# Patient Record
Sex: Male | Born: 1955 | Race: White | Hispanic: No | Marital: Single | Smoking: Former smoker
Health system: Southern US, Community
[De-identification: ages and names within clinical notes are randomized; demographics above are authoritative.]

## PROBLEM LIST (undated history)

## (undated) DIAGNOSIS — N200 Calculus of kidney: Secondary | ICD-10-CM

## (undated) DIAGNOSIS — I10 Essential (primary) hypertension: Secondary | ICD-10-CM

## (undated) DIAGNOSIS — G4730 Sleep apnea, unspecified: Secondary | ICD-10-CM

## (undated) DIAGNOSIS — Z950 Presence of cardiac pacemaker: Secondary | ICD-10-CM

## (undated) DIAGNOSIS — R001 Bradycardia, unspecified: Secondary | ICD-10-CM

## (undated) DIAGNOSIS — K5790 Diverticulosis of intestine, part unspecified, without perforation or abscess without bleeding: Secondary | ICD-10-CM

## (undated) DIAGNOSIS — I480 Paroxysmal atrial fibrillation: Secondary | ICD-10-CM

## (undated) DIAGNOSIS — F329 Major depressive disorder, single episode, unspecified: Secondary | ICD-10-CM

## (undated) DIAGNOSIS — I5022 Chronic systolic (congestive) heart failure: Secondary | ICD-10-CM

## (undated) DIAGNOSIS — J449 Chronic obstructive pulmonary disease, unspecified: Secondary | ICD-10-CM

## (undated) DIAGNOSIS — M1990 Unspecified osteoarthritis, unspecified site: Secondary | ICD-10-CM

## (undated) DIAGNOSIS — L570 Actinic keratosis: Secondary | ICD-10-CM

## (undated) DIAGNOSIS — Z87442 Personal history of urinary calculi: Secondary | ICD-10-CM

## (undated) DIAGNOSIS — F32A Depression, unspecified: Secondary | ICD-10-CM

## (undated) DIAGNOSIS — R7611 Nonspecific reaction to tuberculin skin test without active tuberculosis: Secondary | ICD-10-CM

## (undated) HISTORY — DX: Major depressive disorder, single episode, unspecified: F32.9

## (undated) HISTORY — DX: Paroxysmal atrial fibrillation: I48.0

## (undated) HISTORY — DX: Diverticulosis of intestine, part unspecified, without perforation or abscess without bleeding: K57.90

## (undated) HISTORY — DX: Chronic systolic (congestive) heart failure: I50.22

## (undated) HISTORY — DX: Depression, unspecified: F32.A

## (undated) HISTORY — DX: Calculus of kidney: N20.0

## (undated) HISTORY — DX: Bradycardia, unspecified: R00.1

## (undated) HISTORY — PX: APPENDECTOMY: SHX54

## (undated) HISTORY — PX: INSERT / REPLACE / REMOVE PACEMAKER: SUR710

## (undated) HISTORY — DX: Actinic keratosis: L57.0

## (undated) HISTORY — DX: Nonspecific reaction to tuberculin skin test without active tuberculosis: R76.11

## (undated) HISTORY — PX: CARDIAC CATHETERIZATION: SHX172

---

## 1989-01-25 DIAGNOSIS — R7611 Nonspecific reaction to tuberculin skin test without active tuberculosis: Secondary | ICD-10-CM

## 1989-01-25 HISTORY — DX: Nonspecific reaction to tuberculin skin test without active tuberculosis: R76.11

## 2005-03-14 ENCOUNTER — Inpatient Hospital Stay (HOSPITAL_COMMUNITY): Admission: EM | Admit: 2005-03-14 | Discharge: 2005-03-15 | Payer: Self-pay | Admitting: Emergency Medicine

## 2013-12-06 ENCOUNTER — Emergency Department: Payer: Self-pay | Admitting: Emergency Medicine

## 2014-02-12 ENCOUNTER — Inpatient Hospital Stay: Payer: Self-pay | Admitting: Internal Medicine

## 2014-02-12 LAB — CBC WITH DIFFERENTIAL/PLATELET
BASOS ABS: 0.1 10*3/uL (ref 0.0–0.1)
BASOS PCT: 0.8 %
EOS PCT: 1.2 %
Eosinophil #: 0.2 10*3/uL (ref 0.0–0.7)
HCT: 49.6 % (ref 40.0–52.0)
HGB: 16.7 g/dL (ref 13.0–18.0)
LYMPHS ABS: 2.5 10*3/uL (ref 1.0–3.6)
LYMPHS PCT: 19.9 %
MCH: 32.3 pg (ref 26.0–34.0)
MCHC: 33.7 g/dL (ref 32.0–36.0)
MCV: 96 fL (ref 80–100)
MONOS PCT: 7.7 %
Monocyte #: 1 x10 3/mm (ref 0.2–1.0)
NEUTROS ABS: 9 10*3/uL — AB (ref 1.4–6.5)
Neutrophil %: 70.4 %
Platelet: 250 10*3/uL (ref 150–440)
RBC: 5.18 10*6/uL (ref 4.40–5.90)
RDW: 12.7 % (ref 11.5–14.5)
WBC: 12.8 10*3/uL — AB (ref 3.8–10.6)

## 2014-02-12 LAB — COMPREHENSIVE METABOLIC PANEL
ALK PHOS: 79 U/L
ALT: 21 U/L
AST: 22 U/L (ref 15–37)
Albumin: 3.6 g/dL (ref 3.4–5.0)
Anion Gap: 5 — ABNORMAL LOW (ref 7–16)
BUN: 7 mg/dL (ref 7–18)
Bilirubin,Total: 0.4 mg/dL (ref 0.2–1.0)
CALCIUM: 8.6 mg/dL (ref 8.5–10.1)
CO2: 29 mmol/L (ref 21–32)
CREATININE: 0.74 mg/dL (ref 0.60–1.30)
Chloride: 105 mmol/L (ref 98–107)
Glucose: 100 mg/dL — ABNORMAL HIGH (ref 65–99)
Osmolality: 276 (ref 275–301)
Potassium: 3.6 mmol/L (ref 3.5–5.1)
Sodium: 139 mmol/L (ref 136–145)
Total Protein: 7.3 g/dL (ref 6.4–8.2)

## 2014-02-12 LAB — URINALYSIS, COMPLETE
BACTERIA: NONE SEEN
BILIRUBIN, UR: NEGATIVE
GLUCOSE, UR: NEGATIVE mg/dL (ref 0–75)
KETONE: NEGATIVE
LEUKOCYTE ESTERASE: NEGATIVE
Nitrite: NEGATIVE
PH: 7 (ref 4.5–8.0)
Protein: NEGATIVE
SPECIFIC GRAVITY: 1.004 (ref 1.003–1.030)
Squamous Epithelial: 1

## 2014-02-12 LAB — TROPONIN I: Troponin-I: <0.02 ng/mL

## 2014-02-12 LAB — LIPASE, BLOOD: Lipase: 104 U/L (ref 73–393)

## 2014-02-13 LAB — CBC WITH DIFFERENTIAL/PLATELET
Basophil #: 0.1 10*3/uL (ref 0.0–0.1)
Basophil %: 0.9 %
EOS PCT: 1.7 %
Eosinophil #: 0.2 10*3/uL (ref 0.0–0.7)
HCT: 43.4 % (ref 40.0–52.0)
HGB: 14.8 g/dL (ref 13.0–18.0)
LYMPHS ABS: 2.6 10*3/uL (ref 1.0–3.6)
Lymphocyte %: 26.3 %
MCH: 32.7 pg (ref 26.0–34.0)
MCHC: 34.1 g/dL (ref 32.0–36.0)
MCV: 96 fL (ref 80–100)
MONOS PCT: 7.9 %
Monocyte #: 0.8 x10 3/mm (ref 0.2–1.0)
NEUTROS ABS: 6.3 10*3/uL (ref 1.4–6.5)
Neutrophil %: 63.2 %
Platelet: 188 10*3/uL (ref 150–440)
RBC: 4.52 10*6/uL (ref 4.40–5.90)
RDW: 12.3 % (ref 11.5–14.5)
WBC: 10 10*3/uL (ref 3.8–10.6)

## 2014-02-13 LAB — COMPREHENSIVE METABOLIC PANEL
ALBUMIN: 3 g/dL — AB (ref 3.4–5.0)
ALK PHOS: 68 U/L
ANION GAP: 5 — AB (ref 7–16)
AST: 18 U/L (ref 15–37)
BUN: 9 mg/dL (ref 7–18)
Bilirubin,Total: 0.5 mg/dL (ref 0.2–1.0)
CALCIUM: 8.2 mg/dL — AB (ref 8.5–10.1)
CHLORIDE: 109 mmol/L — AB (ref 98–107)
CREATININE: 0.75 mg/dL (ref 0.60–1.30)
Co2: 27 mmol/L (ref 21–32)
EGFR (African American): >60
Glucose: 80 mg/dL (ref 65–99)
OSMOLALITY: 279 (ref 275–301)
Potassium: 3.4 mmol/L — ABNORMAL LOW (ref 3.5–5.1)
SGPT (ALT): 18 U/L
Sodium: 141 mmol/L (ref 136–145)
Total Protein: 6.1 g/dL — ABNORMAL LOW (ref 6.4–8.2)

## 2014-04-09 ENCOUNTER — Ambulatory Visit (INDEPENDENT_AMBULATORY_CARE_PROVIDER_SITE_OTHER): Payer: Self-pay | Admitting: Psychology

## 2014-04-09 DIAGNOSIS — F332 Major depressive disorder, recurrent severe without psychotic features: Secondary | ICD-10-CM

## 2014-05-07 ENCOUNTER — Ambulatory Visit (INDEPENDENT_AMBULATORY_CARE_PROVIDER_SITE_OTHER): Payer: Self-pay | Admitting: Psychology

## 2014-05-07 DIAGNOSIS — F1020 Alcohol dependence, uncomplicated: Secondary | ICD-10-CM

## 2014-05-07 DIAGNOSIS — F332 Major depressive disorder, recurrent severe without psychotic features: Secondary | ICD-10-CM

## 2014-05-15 ENCOUNTER — Ambulatory Visit (INDEPENDENT_AMBULATORY_CARE_PROVIDER_SITE_OTHER): Payer: Self-pay | Admitting: Psychology

## 2014-05-15 DIAGNOSIS — F1020 Alcohol dependence, uncomplicated: Secondary | ICD-10-CM

## 2014-05-15 DIAGNOSIS — F332 Major depressive disorder, recurrent severe without psychotic features: Secondary | ICD-10-CM

## 2014-05-20 LAB — SURGICAL PATHOLOGY

## 2014-05-22 ENCOUNTER — Ambulatory Visit: Payer: 59 | Admitting: Psychology

## 2014-05-26 NOTE — Consult Note (Signed)
Chart reviewed and case discussed with PA student.  Probable duodenal ulcer, concern for micro perf, no free air on CT.  Dr. Candace Cruise to do today.  Electronic Signatures: Manya Silvas (MD)  (Signed on 20-Jan-16 10:41)  Authored  Last Updated: 20-Jan-16 10:41 by Manya Silvas (MD)

## 2014-05-26 NOTE — Discharge Summary (Signed)
PATIENT NAME:  Brandon, Lopez MR#:  [id number] DATE OF BIRTH:  [birth year]  DATE OF ADMISSION:  02/12/2014 DATE OF DISCHARGE:  02/14/2014  ADMITTING DIAGNOSIS:  Abdominal pain.   DISCHARGE DIAGNOSES:  1.  Duodenitis with likely duodenal ulceration.  2.  Obstructive sleep apnea on CPAP.  3.  Hypertension.  4.  Sick sinus syndrome status post permanent pacemaker placement.  5.  Chronic obstructive pulmonary disease.  6.  Coronary artery disease   DISCHARGE MEDICATIONS:  1.  Pantoprazole 40 mg twice a day for eight weeks or until instructed to stop it by gastroenterology.  2.  Lisinopril 40 mg 1 tablet daily.  3.  Hydrochlorothiazide 25 mg 1 tablet daily.  4.  Aspirin 81 mg 1 tablet daily.  5.  Celexa 40 mg 1 tablet daily.  6.  Symbicort 160 mcg to 4.5 mcg/inhalations 2 puffs inhaled twice a day.   CONSULTATIONS:  Verdie Shire, gastroenterology.   PROCEDURES:  1.  Upper GI endoscopy January 20, by Dr. Candace Cruise shows normal stomach.  Localized severely erythematous mucous found in the first part of the duodenum. Biopsies were taken. This area is also very edematous. Normal esophagus.  2.  CT scan abdomen and pelvis showing abnormal segmental thickening of duodenal wall with significant stranding surrounding duodenal fat in the mid duodenum. Findings highly suspicious for segmental enteritis ulcer disease or possible neoplastic process. Calcified gallstones noted. No obstruction. No pericecal inflammation. Distal colonic diverticula noted. No diverticulitis.   HISTORY OF PRESENT ILLNESS: This 59 year old Caucasian gentleman with history of COPD, obstructive sleep apnea on BiPAP, coronary artery disease, presents with abdominal pain of five days' duration. Pain is 6-9 out of 10 in intensity. He has associated nausea, vomiting. No melena, hematochezia, or change in bowel habits.   HOSPITAL COURSE BY PROBLEM:   1.  Duodenitis with probable duodenal ulcer: CT abdomen showed focal inflammation of the  duodenum. The gastroenterology, Dr. Candace Cruise, was consulted. Upper gastrointestinal endoscopy was performed on January 20, showing significant inflammation and edema of the duodenum. Dr. Candace Cruise felt that this was likely due to a healed duodenal ulcer. Biopsies were taken for pathology and also for helicobacter pylori testing. The patient did well after the procedure. He tolerated a clear liquid and was advanced to an easy to digest diet. At the time of discharge, he is not having any pain and is tolerating an easy to digest diet. He will be discharged on b.i.d. PPI.  He will follow up with gastroenterology in two weeks.  2.  Hypertension: The patient's blood pressures were fairly well controlled in hospital on his home regimen.  3.  Coronary artery disease: The patient did not have any chest pain throughout the hospitalization. He will remain on his home regimen of aspirin, antihypertensives.  4.  Chronic obstructive pulmonary disease: No signs of COPD exacerbation during hospitalization, respiratory status remained stable; he did not need supplemental oxygenation, and remains on home inhalers on discharge.  5.  Obstructive sleep apnea: The patient had a CPAP machine during his hospitalization.   DISCHARGE PHYSICAL EXAMINATION:  VITAL SIGNS: Temperature 97.8, pulse 65, respirations 18, blood pressure 168/94, oxygenation 99% on room air.  GENERAL: No acute distress.  CARDIOVASCULAR: Regular rate and rhythm. No murmurs, rubs, or gallops.  RESPIRATORY: Lungs clear to auscultation bilaterally with good air movement.  ABDOMEN: Soft, nontender, nondistended, bowel sounds are normal.  PSYCHIATRIC: The patient is alert and oriented x4 with good insight into his clinical condition.   LABORATORY DATA:  Sodium 141, potassium 3.4 (this was corrected with 40 mEq of oral potassium), chloride 109, bicarbonate 27, BUN 9, creatinine 0.75, glucose 80, lipase 104, total protein 6.1, albumin 3.0, bilirubin 0.5, alkaline phosphatase  68, AST 18, ALT 18. Troponin is less than 0.02. White blood cells 10, hemoglobin 14.8, platelets at 188, and MCV is 96.  Urinalysis showing no signs of infection. Pathology from EGD shows no helicobacter pylori, chronic duodenitis.   CONDITION ON DISCHARGE: Stable.   DISPOSITION: The patient is discharged home with no home health needs.   DISCHARGE INSTRUCTIONS:   DIET: Heart healthy diet:  The patient has been advised to choose softer easier to digest foods and avoid sharp crunchy foods for at least  two weeks after discharge.   ACTIVITY: No restrictions.   FOLLOWUP:  The patient will follow up with Dr. Candace Cruise within the next two weeks.   TIME SPENT ON DISCHARGE: 40 minutes.     ____________________________ Earleen Newport. Volanda Napoleon, MD cpw:at D: 02/14/2014 17:46:44 ET T: 02/14/2014 18:16:53 ET JOB#: [id number]  cc: Barnetta Chapel P. Volanda Napoleon, MD, <Dictator> Aldean Jewett MD ELECTRONICALLY SIGNED 02/26/2014 9:46

## 2014-05-26 NOTE — H&P (Signed)
PATIENT NAME:  Brandon Lopez, Brandon Lopez MR#:  [id number] DATE OF BIRTH:  1955/10/27  DATE OF ADMISSION:  02/12/2014  REFERRING PHYSICIAN: Debbrah Alar, MD  PRIMARY CARE PHYSICIAN: York Ram, MD  CHIEF COMPLAINT: Abdominal pain.   HISTORY OF PRESENT ILLNESS: A 59 year old Caucasian gentleman with history of COPD, non-oxygen dependent as well as obstructive sleep apnea on BiPAP at nighttime presenting with abdominal pain. Describes a 5-day duration of abdominal pain, right upper quadrant epigastric in location, described only as "pain," intensity 6-9  out of 10, waxing and waning, no worsening or relieving factors. Has associated nausea, vomiting with nonbloody, nonbilious emesis. No change in bowel habits. No melena or hematochezia. Evaluation in the Emergency Department including CT scan revealed duodenitis with potential concern for a small contained perforation. Case was discussed with gastroenterology as well as surgery by the ER staff.   REVIEW OF SYSTEMS:  CONSTITUTIONAL: Denies fever, chills, fatigue, weakness.  EYES: Denies blurred vision, double vision, or eye pain.  EAR, NOSE, THROAT: Denies tinnitus, ear pain, hearing loss. RESPIRATORY: Denies cough or shortness of breath.  CARDIOVASCULAR: Denies chest pain, palpitations, edema. GASTROINTESTINAL: Positive for nausea, vomiting, abdominal pain, as described above. Denies any diarrhea.  GENITOURINARY: Denies dysuria or hematuria.  ENDOCRINE: Denies nocturia or thyroid problems.  HEMATOLOGIC AND LYMPHATIC: Denies easy bruising or bleeding. SKIN: Denies rash or lesions. MUSCULOSKELETAL: Denies pain in neck, back, shoulder, knees, hips, or arthritic symptoms.  NEUROLOGIC: Denies paralysis, paresthesias.  PSYCHIATRIC: Denies anxiety or depressive symptoms. Otherwise, a full review of systems performed by me is negative.  PAST MEDICAL HISTORY: Includes coronary artery disease without angina; COPD, non oxygen dependent; sinus syndrome,  status post permanent pacemaker insertion as well as appendectomy, obstructive sleep apnea requiring BiPAP therapy at bedtime.   SOCIAL HISTORY: Positive for tobacco use. Positive for 2-3 beers daily; however, stopped drinking alcohol at onset of symptoms greater than 5 days ago.   FAMILY HISTORY: Denies any known cardiovascular or pulmonary disorders.   ALLERGIES: SIMVASTATIN, TRAMADOL.   HOME MEDICATIONS: Include aspirin 81 mg p.o. daily, lisinopril 40 mg p.o. daily, Celexa 40 mg p.o. daily, Symbicort 160/4.5 mg inhalation 2 puffs b.i.d., hydrochlorothiazide 25 mg p.o. daily.   PHYSICAL EXAMINATION:  VITAL SIGNS: Temperature 98, heart rate 65, respirations 18, blood pressure 150/99, saturating 95% on room air. Weight 52.5 kg, BMI 29.4.  GENERAL: Well-nourished, well-developed Caucasian gentleman currently in no acute distress.  HEAD: Normocephalic, atraumatic.  EYES: Pupils equal, round, reactive to light. Extraocular muscles intact. No scleral icterus.  MOUTH: Moist mucous membranes. Dentition intact. No abscess noted. EAR, NOSE, THROAT: Clear without exudates. No external lesions.   NECK: Supple. No thyromegaly. No nodules. No JVD.  PULMONARY: Clear to auscultation bilaterally without wheezes, rales, rhonchi. No use of accessory muscles. Good respiratory effort.  CHEST: Nontender to palpation.  CARDIOVASCULAR: S1, S2, regular rate and rhythm. No murmurs, rubs, or gallops. No edema. Pedal pulses 2+ bilaterally.  GASTROINTESTINAL: Soft. Minimal tenderness in right upper quadrant epigastric region, without guarding, motion tenderness, or rebound. Positive bowel sounds. No hepatosplenomegaly.  MUSCULOSKELETAL: No swelling or edema. Positive for clubbing of the fingers bilaterally. Range of motion full in all extremities.  NEUROLOGIC: Cranial nerves II-XII intact. No gross focal neurologic deficits. Sensation intact. Reflexes intact.  SKIN: No ulcerations, rash, lesions, cyanosis. Skin warm,  dry. Turgor intact.  PSYCHIATRIC: Mood and affect within normal limits. Patient alert, oriented x 3. Insight and judgment intact.    LABORATORY DATA: Sodium 139, potassium 3.6,  chloride 105, bicarbonate 29, BUN 7, creatinine 0.74, glucose 100. LFTs within normal limits. Troponin less than 0.02. WBC 12.8, hemoglobin of 16.7, platelets of 250,000. Urinalysis negative for evidence of infection. Abdominal ultrasound performed, which reveals likely focal fatty sparing of the right lobe of the liver. No focal abnormalities. CT abdomen and pelvis performed which reveals abnormal segmental thickening of duodenal wall with significant stranding in surrounding duodenal fat in the mid duodenum, trace adjacent fluid noted, measuring an area of about 5 cm in length, suspicious for segmental enteritis, ulcer disease, or less likely neoplastic process.   ASSESSMENT AND PLAN: A 59 year old Caucasian chronic obstructive pulmonary disease, non-oxygen dependent; obstructive sleep apnea on BiPAP therapy presenting with abdominal pain.  1.  Duodenitis: Initiate proton pump inhibitor therapy intravenously. We will place the patient n.p.o., intravenous fluid hydration. We will consult gastroenterology for an endoscopy in the morning. The case was discussed with gastroenterology as well as surgery in the Emergency Department. Has received Zosyn for antibiotic coverage. We will continue this. 2.  Obstructive sleep apnea: Provide CPAP therapy at bedtime. Symbicort. 3.  Hypertension: Continue with lisinopril. Hold hydrochlorothiazide.  4.  Venous thromboembolism prophylaxis: Sequential compression devices.   CODE STATUS: Patient is a full code.  TIME SPENT: 45 minutes.   ____________________________ Aaron Mose. Hower, MD dkh:bm D: 02/12/2014 23:32:15 ET T: 02/13/2014 02:35:18 ET JOB#: [id number]  cc: Aaron Mose. Hower, MD, <Dictator> DAVID Woodfin Ganja MD ELECTRONICALLY SIGNED 02/13/2014 3:33

## 2014-05-26 NOTE — Consult Note (Signed)
EGD showed an area in proximal duodenum with significant edema and erythema. No obvious ulcer seen. However, patient likely had a penetrating ulcer at this site. Continue PPI bid. Start clears and advance as tolerated. Stomach bx for H.pylori also taken. If positive, treat as well. Thanks.  Electronic Signatures: Verdie Shire (MD)  (Signed on 20-Jan-16 13:59)  Authored  Last Updated: 20-Jan-16 13:59 by Verdie Shire (MD)

## 2014-05-26 NOTE — Consult Note (Signed)
PATIENT NAME:  Brandon Lopez, Brandon Lopez MR#:  [id number] DATE OF BIRTH:  26-Mar-1955  DATE OF CONSULTATION:  02/13/2014  REFERRING PHYSICIAN:   CONSULTING PHYSICIAN:  Manya Silvas, MD  HISTORY OF PRESENT ILLNESS: The patient is a 59 year old white male presenting with abdominal pain of 5 days' duration, epigastric/right upper quadrant, probably 7 out of 10 scale, associated with nausea and vomiting, with no hematemesis and no melena. He presented to the ER. CAT scan showed abnormality of the duodenum, and I was contacted by the ER for an opinion. I recommended he be admitted to the hospital. I am seeing him in consult today. Dr. Candace Cruise did an upper endoscopy on him at my request, because of my schedule problems.   REVIEW OF SYSTEMS: Positive for nausea, vomiting and abdominal pain.   PAST MEDICAL HISTORY: He has a history of coronary artery disease and COPD  SOCIAL HISTORY: Drinks a few beers a day, sometimes as many as a 6-8, and sometimes as low as 2-3. Also smoking cigarettes; trying to quit and going to e-type cigarettes.   MEDICATIONS: Celexa 40 mg a day, aspirin 81 mg a day, Symbicort inhaler 2 puffs b.i.d., hydrochlorothiazide 25 mg a day, lisinopril 40 mg a day.   PHYSICAL EXAMINATION:  VITAL SIGNS: Blood pressure 150/100, respirations 18, heart rate 64.  GENERAL: White male in no acute distress.  HEENT: Sclerae non icteric. Conjunctivae negative.  CHEST: Clear.  HEART: No murmurs or gallops.  ABDOMEN: Abdomen shows minimal tenderness in the right upper quadrant and epigastric area.  SKIN: Warm and dry.  PSYCHIATRIC: Mood and affect are appropriate.   LABORATORY DATA: BUN 7, creatinine 0.74. Troponin less than 0.02. White count 12.8, hemoglobin 16.7. CT scan of the abdomen shows a segmental thickening of the duodenal wall, enhancement of the mucosa suspicion for segmental ulcer disease, small fluid collection in the duodenal wall and a small amount of air, possibly an ulcer, possible  diverticulum, possible contained perforation.   PLAN: The patient needs an upper endoscopy to confirmed the severity of his problem,  performed by Dr. Candace Cruise today, showing duodenal ulceration.    ____________________________ Manya Silvas, MD rte:MT D: 02/13/2014 17:29:46 ET T: 02/13/2014 17:40:13 ET JOB#: [id number]  cc: Manya Silvas, MD, <Dictator> Manya Silvas MD ELECTRONICALLY SIGNED 03/07/2014 14:37

## 2014-05-27 ENCOUNTER — Encounter
Admission: RE | Disposition: A | Discharge status: Home / Self Care | Payer: Self-pay | Source: Ambulatory Visit | Attending: Gastroenterology

## 2014-05-27 ENCOUNTER — Encounter: Payer: Self-pay | Admitting: *Deleted

## 2014-05-27 ENCOUNTER — Ambulatory Visit: Payer: 59 | Admitting: Anesthesiology

## 2014-05-27 ENCOUNTER — Ambulatory Visit
Admission: RE | Admit: 2014-05-27 | Discharge: 2014-05-27 | Disposition: A | Discharge status: Home / Self Care | Payer: 59 | Source: Ambulatory Visit | Attending: Gastroenterology | Admitting: Gastroenterology

## 2014-05-27 DIAGNOSIS — J449 Chronic obstructive pulmonary disease, unspecified: Secondary | ICD-10-CM | POA: Diagnosis not present

## 2014-05-27 DIAGNOSIS — Z888 Allergy status to other drugs, medicaments and biological substances status: Secondary | ICD-10-CM | POA: Insufficient documentation

## 2014-05-27 DIAGNOSIS — Z1211 Encounter for screening for malignant neoplasm of colon: Secondary | ICD-10-CM | POA: Insufficient documentation

## 2014-05-27 DIAGNOSIS — K5730 Diverticulosis of large intestine without perforation or abscess without bleeding: Secondary | ICD-10-CM | POA: Insufficient documentation

## 2014-05-27 HISTORY — DX: Unspecified osteoarthritis, unspecified site: M19.90

## 2014-05-27 HISTORY — DX: Presence of cardiac pacemaker: Z95.0

## 2014-05-27 HISTORY — DX: Chronic obstructive pulmonary disease, unspecified: J44.9

## 2014-05-27 HISTORY — PX: COLONOSCOPY: SHX5424

## 2014-05-27 HISTORY — DX: Sleep apnea, unspecified: G47.30

## 2014-05-27 HISTORY — DX: Essential (primary) hypertension: I10

## 2014-05-27 SURGERY — COLONOSCOPY
Anesthesia: General

## 2014-05-27 MED ORDER — PROPOFOL 10 MG/ML IV BOLUS
INTRAVENOUS | Status: DC | PRN
  Administered 2014-05-27: 20 mg via INTRAVENOUS
  Administered 2014-05-27: 30 mg via INTRAVENOUS
  Administered 2014-05-27: 20 mg via INTRAVENOUS

## 2014-05-27 MED ORDER — SODIUM CHLORIDE 0.9 % IV SOLN
INTRAVENOUS | Status: DC
  Administered 2014-05-27: 13:00:00 via INTRAVENOUS

## 2014-05-27 MED ORDER — LIDOCAINE HCL (PF) 2 % IJ SOLN
INTRAMUSCULAR | Status: DC | PRN
  Administered 2014-05-27: 50 mg via INTRADERMAL

## 2014-05-27 MED ORDER — PROPOFOL INFUSION 10 MG/ML OPTIME
INTRAVENOUS | Status: DC | PRN
  Administered 2014-05-27: 150 ug/kg/min via INTRAVENOUS

## 2014-05-27 NOTE — Anesthesia Preprocedure Evaluation (Signed)
Anesthesia Evaluation  Patient identified by MRN, date of birth, ID band Patient awake    Reviewed: Allergy & Precautions, H&P , NPO status , Patient's Chart, lab work & pertinent test results  Airway Mallampati: II  TM Distance: >3 FB Neck ROM: full    Dental  (+) Chipped   Pulmonary sleep apnea (BiPap recommended has not gotten it yet) , COPD COPD inhaler, Current Smoker,          Cardiovascular hypertension, Pt. on medications + pacemaker     Neuro/Psych    GI/Hepatic GERD- (No meds right now)  ,  Endo/Other    Renal/GU Renal disease (Stones)     Musculoskeletal   Abdominal   Peds  Hematology   Anesthesia Other Findings   Reproductive/Obstetrics                             Anesthesia Physical Anesthesia Plan  ASA: II  Anesthesia Plan: General   Post-op Pain Management:    Induction:   Airway Management Planned:   Additional Equipment:   Intra-op Plan:   Post-operative Plan:   Informed Consent: I have reviewed the patients History and Physical, chart, labs and discussed the procedure including the risks, benefits and alternatives for the proposed anesthesia with the patient or authorized representative who has indicated his/her understanding and acceptance.     Plan Discussed with: CRNA  Anesthesia Plan Comments:         Anesthesia Quick Evaluation

## 2014-05-27 NOTE — H&P (Signed)
  Here for screening colonoscopy  PMH. COPD, sleep apnea,   Allergies: pravastatin  Meds: see above  PE: NAD, VSS HEENT;wnl CV:RRR Lungs:CTA Abdomen: NABS, soft, nontender Ext:-C/C/E  Imp: Screening colon  Plan: colonoscopy

## 2014-05-27 NOTE — Op Note (Signed)
[facility] Gastroenterology Patient Name: Brandon Lopez Procedure Date: 05/27/2014 1:17 PM MRN: [id number] Account #: [id number] Date of Birth: 06-18-1955 Admit Type: Outpatient Age: 59 Room: [facility] ENDO ROOM 4 Gender: Male Note Status: Finalized Procedure:         Colonoscopy Indications:       Screening for colorectal malignant neoplasm Providers:         Lupita Dawn. Candace Cruise, MD Medicines:         Monitored Anesthesia Care Complications:     No immediate complications. Procedure:         Pre-Anesthesia Assessment:                    - Prior to the procedure, a History and Physical was                     performed, and patient medications, allergies and                     sensitivities were reviewed. The patient's tolerance of                     previous anesthesia was reviewed.                    - The risks and benefits of the procedure and the sedation                     options and risks were discussed with the patient. All                     questions were answered and informed consent was obtained.                    - After reviewing the risks and benefits, the patient was                     deemed in satisfactory condition to undergo the procedure.                    After obtaining informed consent, the colonoscope was                     passed under direct vision. Throughout the procedure, the                     patient's blood pressure, pulse, and oxygen saturations                     were monitored continuously. The Olympus CF-H180AL                     colonoscope ( S#: [id number] ) was introduced through the                     anus and advanced to the the cecum, identified by                     appendiceal orifice and ileocecal valve. The colonoscopy                     was performed without difficulty. The patient tolerated                     the procedure well. The quality of the  bowel preparation                     was fair. Findings:  Multiple small and large-mouthed diverticula were found in the sigmoid       colon.      The exam was otherwise without abnormality. Impression:        - Diverticulosis in the sigmoid colon.                    - The examination was otherwise normal.                    - No specimens collected. Recommendation:    - Discharge patient to home.                    - High fiber diet.                    - Repeat colonoscopy in 10 years for surveillance.                    - The findings and recommendations were discussed with the                     patient. Procedure Code(s): --- Professional ---                    2202133157, Colonoscopy, flexible; diagnostic, including                     collection of specimen(s) by brushing or washing, when                     performed (separate procedure) Diagnosis Code(s): --- Professional ---                    Z12.11, Encounter for screening for malignant neoplasm of                     colon                    K57.30, Diverticulosis of large intestine without                     perforation or abscess without bleeding CPT copyright 2014 [employer]. All rights reserved. The codes documented in this report are preliminary and upon coder review may  be revised to meet current compliance requirements. Hulen Luster, MD 05/27/2014 1:31:39 PM This report has been signed electronically. Number of Addenda: 0 Note Initiated On: 05/27/2014 1:17 PM Scope Withdrawal Time: 0 hours 5 minutes 54 seconds  Total Procedure Duration: 0 hours 9 minutes 29 seconds       [facility]

## 2014-05-27 NOTE — Anesthesia Postprocedure Evaluation (Signed)
  Anesthesia Post-op Note  Patient: Brandon Lopez  Procedure(s) Performed: Procedure(s): COLONOSCOPY (N/A)  Anesthesia type:General  Patient location: PACU  Post pain: Pain level controlled  Post assessment: Post-op Vital signs reviewed, Patient's Cardiovascular Status Stable, Respiratory Function Stable, Patent Airway and No signs of Nausea or vomiting  Post vital signs: Reviewed and stable  Last Vitals:  Filed Vitals:   05/27/14 1346  BP: 138/97  Pulse: 76  Temp:   Resp: 15    Level of consciousness: awake, alert  and patient cooperative  Complications: No apparent anesthesia complications

## 2014-05-27 NOTE — Transfer of Care (Signed)
Immediate Anesthesia Transfer of Care Note  Patient: Brandon Lopez  Procedure(s) Performed: Procedure(s): COLONOSCOPY (N/A)  Patient Location: PACU  Anesthesia Type:MAC  Level of Consciousness: awake  Airway & Oxygen Therapy: Patient Spontanous Breathing  Post-op Assessment: Report given to RN  Post vital signs: stable  Last Vitals:  Filed Vitals:   05/27/14 1336  BP: 121/89  Pulse: 71  Temp: 36.3 C  Resp: 19    Complications: No apparent anesthesia complications

## 2014-05-29 ENCOUNTER — Ambulatory Visit: Payer: 59 | Admitting: Psychology

## 2014-05-30 ENCOUNTER — Encounter: Payer: Self-pay | Admitting: Gastroenterology

## 2014-06-03 ENCOUNTER — Ambulatory Visit (INDEPENDENT_AMBULATORY_CARE_PROVIDER_SITE_OTHER): Payer: 59 | Admitting: Primary Care

## 2014-06-03 ENCOUNTER — Encounter: Payer: Self-pay | Admitting: Primary Care

## 2014-06-03 VITALS — BP 116/70 | HR 85 | Temp 97.7°F | Ht 66.0 in | Wt 181.8 lb

## 2014-06-03 DIAGNOSIS — G4733 Obstructive sleep apnea (adult) (pediatric): Secondary | ICD-10-CM | POA: Diagnosis not present

## 2014-06-03 DIAGNOSIS — F1010 Alcohol abuse, uncomplicated: Secondary | ICD-10-CM | POA: Diagnosis not present

## 2014-06-03 DIAGNOSIS — F17200 Nicotine dependence, unspecified, uncomplicated: Secondary | ICD-10-CM | POA: Insufficient documentation

## 2014-06-03 DIAGNOSIS — I10 Essential (primary) hypertension: Secondary | ICD-10-CM

## 2014-06-03 DIAGNOSIS — F32A Depression, unspecified: Secondary | ICD-10-CM

## 2014-06-03 DIAGNOSIS — F329 Major depressive disorder, single episode, unspecified: Secondary | ICD-10-CM

## 2014-06-03 DIAGNOSIS — Z720 Tobacco use: Secondary | ICD-10-CM | POA: Insufficient documentation

## 2014-06-03 DIAGNOSIS — Z950 Presence of cardiac pacemaker: Secondary | ICD-10-CM | POA: Diagnosis not present

## 2014-06-03 DIAGNOSIS — F419 Anxiety disorder, unspecified: Secondary | ICD-10-CM

## 2014-06-03 NOTE — Assessment & Plan Note (Signed)
Stable on Lisinopril 40 mg and HCTZ 25 mg. Will obtain BMP next visit.

## 2014-06-03 NOTE — Progress Notes (Signed)
Subjective:    Patient ID: Brandon Lopez, male    DOB: Feb 18, 1955, 59 y.o.   MRN: [id number]  HPI  Brandon Lopez is a 59 year old male who presents today to establish care and discuss the problems mentioned below. Will obtain old records.  1) Depression: Diagnosed with depression for 10+ years. He's currently seeing Pervis Hocking for therapy at [facility]. He's currently managed on citalopram 40 mg tablets for the past 5 years. He feels okay, but doesn't feel that he's completely managed with this medication. Denies SI/HI. He's been on Wellbutrin and Effexor independently in the past without success.  2) Tobacco Abuse: Current smoker for the past 50 years. Smoked 3 packs per day at most, currently smokes 1-2 packs per week. He's thought about quitting, but would like to take care of becoming established first.  3) Heart Disease: He has a pacemaker that was implanted in 2013 at [facility] due to symptomatic bradycardia. He's not had follow up for 1.5 years and would like a referral somewhere closer to home for evaluation and maintenance. He received a letter in the mail from [place] which reported his pacemaker was funcitoning well. Denies chest pain, weakness, dizziness. Diet consists of lean meats, vegetables, some fruit, nuts. Drinks coffee, water, 8-12 cans of 12 ounce beer daily.    4) OSA: Multiple sleep studies completed within 3 years ago at [facility], and was found to have sleep apnea. They recommended Bipap equipment rather than CPAP, but he never got equipment. He needs to have his sleep study records sent to [place] for proper equipment. He snores, wakes during the night, and experiences daytime tiredness.   5) Hypertension: Diagnosed 15 years ago. He is currently managed on Lisinopril 40 mg and HCTZ 25. Denies headaches, blurred vision.  6) Alcohol Abuse: Drinks 8-12 cans of 12 ounce beers daily. He has looked into detox treatment but cannot afford the cost. He's unsure of his  current liver function. Denies abdominal pain.  Review of Systems  Constitutional: Negative for unexpected weight change.       Daytime tiredness  HENT: Positive for postnasal drip.   Respiratory: Negative for shortness of breath.   Cardiovascular: Negative for chest pain.  Gastrointestinal: Negative for abdominal pain, diarrhea and constipation.  Genitourinary: Negative for dysuria, frequency and difficulty urinating.  Musculoskeletal:       Chronic bursitis to bilateral shoulders  Skin: Negative for rash.  Neurological: Negative for headaches.  Hematological: Negative for adenopathy.  Psychiatric/Behavioral:       See HPI       Past Medical History  Diagnosis Date  . Hypertension   . Presence of permanent cardiac pacemaker     Biotronik; Dayna Barker DR-T [id number]; placed 08/2012  . Sleep apnea   . COPD (chronic obstructive pulmonary disease)   . Arthritis   . Depression   . Kidney stones   . 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 Diverticulosis     Found during colonoscopy    History   Social History  . Marital Status: Single    Spouse Name: N/A  . Number of Children: N/A  . Years of Education: N/A   Occupational History  . Not on file.   Social History Main Topics  . Smoking status: Light Tobacco Smoker -- 0.25 packs/day for 40 years    Types: Cigarettes  . Smokeless tobacco: Not on file  . Alcohol Use: 42.0 oz/week    70 Cans of beer per week  .  Drug Use: No  . Sexual Activity: Not on file   Other Topics Concern  . Not on file   Social History Narrative    Past Surgical History  Procedure Laterality Date  . Appendectomy  age 45  . Insert / replace / remove pacemaker      3 years ago  . Cardiac catheterization      7 years ago  . Colonoscopy N/A 05/27/2014    Procedure: COLONOSCOPY;  Surgeon: Hulen Luster, MD;  Location: [facility] ENDOSCOPY;  Service: Gastroenterology;  Laterality: N/A;    Family History  Problem Relation Age of Onset  . Arthritis Mother   . Heart  disease Father   . Hyperlipidemia Father   . Hypertension Father   . Cancer Paternal Grandfather     Allergies  Allergen Reactions  . Simvastatin Diarrhea    Current Outpatient Prescriptions on File Prior to Visit  Medication Sig Dispense Refill  . albuterol (PROVENTIL HFA;VENTOLIN HFA) 108 (90 BASE) MCG/ACT inhaler Inhale 2 puffs into the lungs every 6 (six) hours as needed.    Marland Kitchen aspirin 81 MG tablet Take 81 mg by mouth daily.    . budesonide-formoterol (SYMBICORT) 80-4.5 MCG/ACT inhaler Inhale 2 puffs into the lungs 2 (two) times daily.    . citalopram (CELEXA) 40 MG tablet Take 40 mg by mouth daily.    . hydrochlorothiazide (HYDRODIURIL) 25 MG tablet Take 25 mg by mouth daily.    Marland Kitchen lisinopril (PRINIVIL,ZESTRIL) 40 MG tablet Take 40 mg by mouth daily.     No current facility-administered medications on file prior to visit.    BP 116/70 mmHg  Pulse 85  Temp(Src) 97.7 F (36.5 C) (Oral)  Ht 5\' 6"  (1.676 m)  Wt 181 lb 12.8 oz (82.464 kg)  BMI 29.36 kg/m2  SpO2 94%    Objective:   Physical Exam  Constitutional: He is oriented to person, place, and time. He appears well-developed.  HENT:  Right Ear: Tympanic membrane and ear canal normal.  Left Ear: Tympanic membrane and ear canal normal.  Nose: Nose normal.  Mouth/Throat: Oropharynx is clear and moist.  Eyes: EOM are normal. Pupils are equal, round, and reactive to light.  Neck: Neck supple.  Cardiovascular: Normal rate and regular rhythm.   Pulmonary/Chest: Effort normal and breath sounds normal.  Abdominal: Soft. Bowel sounds are normal. There is no tenderness.  Lymphadenopathy:    He has no cervical adenopathy.  Neurological: He is alert and oriented to person, place, and time.  Skin: Skin is warm and dry.  Psychiatric: He has a normal mood and affect.          Assessment & Plan:  Spent 5 min discussing the importance tobacco cessation and alcohol cessation today.

## 2014-06-03 NOTE — Assessment & Plan Note (Signed)
Managed on citalopram 40 mg for previous 5 years. He feels somewhat managed, but not entirely symptom free. Currently in therapy. Denies SI/HI. Will re-evaluate during next appointment with PHQ-9 and consider adding additional medication.

## 2014-06-03 NOTE — Patient Instructions (Addendum)
You will be contacted regarding your referral to Cardiology.  Please let us know if you have not heard back within one week.  Please schedule a physical with me in the next 1-2 months. You will also schedule a lab only appointment one week prior. We will discuss your lab results during your physical. It was a pleasure to meet you today! Please don't hesitate to call me with any questions. Welcome to [facility]!

## 2014-06-03 NOTE — Assessment & Plan Note (Signed)
Smoked for past 50 years, has cut back a lot. He is considering quitting, but is not ready today. Education provided on importance of smoking cessation and its effects on his overall health. Will continue to evaluate.

## 2014-06-03 NOTE — Progress Notes (Signed)
Pre visit review using our clinic review tool, if applicable. No additional management support is needed unless otherwise documented below in the visit note. 

## 2014-06-03 NOTE — Assessment & Plan Note (Signed)
Inserted in 2013 due to symptomatic bradycardia. Once followed with [facility], referral made to [facility] for [facility] and follow up. Asymptomatic.

## 2014-06-03 NOTE — Assessment & Plan Note (Signed)
Currently without equipment. Sleep study performed at [place] years ago. Will get old records and fax them to [place] for proper equipment.

## 2014-06-03 NOTE — Assessment & Plan Note (Signed)
Drinks 8-12 cans of 12 ounces of beer daily. He once looked into detox treatment which was too expensive. Will obtain old records, and also LFT's during physical next visit.

## 2014-06-11 ENCOUNTER — Encounter: Payer: Self-pay | Admitting: Internal Medicine

## 2014-06-11 ENCOUNTER — Other Ambulatory Visit: Payer: Self-pay

## 2014-06-11 ENCOUNTER — Telehealth: Payer: Self-pay

## 2014-06-11 ENCOUNTER — Ambulatory Visit (INDEPENDENT_AMBULATORY_CARE_PROVIDER_SITE_OTHER): Payer: 59 | Admitting: Internal Medicine

## 2014-06-11 VITALS — BP 136/87 | HR 66 | Ht 66.0 in | Wt 181.0 lb

## 2014-06-11 DIAGNOSIS — I495 Sick sinus syndrome: Secondary | ICD-10-CM | POA: Diagnosis not present

## 2014-06-11 DIAGNOSIS — R079 Chest pain, unspecified: Secondary | ICD-10-CM

## 2014-06-11 DIAGNOSIS — Z950 Presence of cardiac pacemaker: Secondary | ICD-10-CM | POA: Diagnosis not present

## 2014-06-11 DIAGNOSIS — R55 Syncope and collapse: Secondary | ICD-10-CM

## 2014-06-11 LAB — CUP PACEART INCLINIC DEVICE CHECK
Battery Remaining Percentage: 85 %
Brady Statistic RA Percent Paced: 85 %
Date Time Interrogation Session: 20160517100644
Lead Channel Pacing Threshold Amplitude: 0.7 V
Lead Channel Sensing Intrinsic Amplitude: 3.5 mV
Lead Channel Sensing Intrinsic Amplitude: 8.5 mV
Lead Channel Setting Pacing Amplitude: 2 V
Lead Channel Setting Pacing Pulse Width: 0.4 ms
MDC IDC MSMT LEADCHNL RA IMPEDANCE VALUE: 546 Ohm
MDC IDC MSMT LEADCHNL RA PACING THRESHOLD AMPLITUDE: 0.8 V
MDC IDC MSMT LEADCHNL RA PACING THRESHOLD PULSEWIDTH: 0.4 ms
MDC IDC MSMT LEADCHNL RV IMPEDANCE VALUE: 526 Ohm
MDC IDC MSMT LEADCHNL RV PACING THRESHOLD PULSEWIDTH: 0.4 ms
MDC IDC PG SERIAL: 68053398
MDC IDC SET LEADCHNL RA PACING AMPLITUDE: 1.8 V
MDC IDC SET LEADCHNL RV SENSING SENSITIVITY: 2.5 mV
MDC IDC STAT BRADY RV PERCENT PACED: 0 %

## 2014-06-11 MED ORDER — AMLODIPINE BESYLATE 5 MG PO TABS: 5.0000 mg | ORAL_TABLET | Freq: Every day | ORAL | Status: DC

## 2014-06-11 NOTE — Progress Notes (Signed)
ELECTROPHYSIOLOGY CONSULT NOTE  Patient ID: Brandon Lopez, MRN: [id number], DOB/AGE: 06/20/1955 59 y.o. Admit date: (Not on file) Date of Consult: 06/11/2014  Primary Physician: Sheral Flow, NP Primary Cardiologist: new  Previously at [place]: pacemaker   HPI Brandon Lopez is a 59 y.o. male  Referred for pacemaker followup  This was implanted at [facility] 2014 for DOE and Presyncope with documented sinus rates-40s.  Heart rate excursion on GXT>>112 with normal BP response;  exercise tolerance and dizzy spells decreased in frequency \ He has had no further syncope.  He continues to complain of dyspnea on exertion. This occurs at less than a flight of stairs. He also has exertional chest discomfort described as a weight with radiation into his neck. It typically lasts minutes and resolve with rest. He has a separate chest pain syndrome which is nonexertional fleeting and sharp.  Cardiac risk factors include hypertension, cigarettes, family history and dyslipidemia >>lab 7/14 were consistent with metabolic syndrome with an LDL of 145, HDL of 32 with a triglyceride of 274   Prior cardiac evaluation at [facility] included >>Stress echo images were normal;l  Moderate LAE peak heart rate was only 68%.  Outside records reviewed demonstrating As above   Labs are also notable for recurrinh hypokalemia, most recently 3.4 1/16  Past Medical History  Diagnosis Date  . Hypertension   . Presence of permanent cardiac pacemaker     Biotronik; Dayna Barker DR-T [id number]; placed 08/2012  . Sleep apnea   . COPD (chronic obstructive pulmonary disease)   . Arthritis   . Depression   . Kidney stones   . 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 Diverticulosis     Found during colonoscopy  . CHF (congestive heart failure)       Surgical History:  Past Surgical History  Procedure Laterality Date  . Appendectomy  age 71  . Insert / replace / remove pacemaker      3 years ago  .  Colonoscopy N/A 05/27/2014    Procedure: COLONOSCOPY;  Surgeon: Hulen Luster, MD;  Location: [facility] ENDOSCOPY;  Service: Gastroenterology;  Laterality: N/A;  . Cardiac catheterization      7 years ago     Home Meds: Prior to Admission medications   Medication Sig Start Date End Date Taking? Authorizing Provider  albuterol (PROVENTIL HFA;VENTOLIN HFA) 108 (90 BASE) MCG/ACT inhaler Inhale 2 puffs into the lungs every 6 (six) hours as needed.   Yes Historical Provider, MD  aspirin 81 MG tablet Take 81 mg by mouth daily.   Yes Historical Provider, MD  budesonide-formoterol (SYMBICORT) 80-4.5 MCG/ACT inhaler Inhale 2 puffs into the lungs 2 (two) times daily.   Yes Historical Provider, MD  citalopram (CELEXA) 40 MG tablet Take 40 mg by mouth daily.   Yes Historical Provider, MD  hydrochlorothiazide (HYDRODIURIL) 25 MG tablet Take 25 mg by mouth daily.   Yes Historical Provider, MD  lisinopril (PRINIVIL,ZESTRIL) 40 MG tablet Take 40 mg by mouth daily.   Yes Historical Provider, MD  loratadine (CLARITIN) 10 MG tablet Take 20 mg by mouth daily.   Yes Historical Provider, MD       Allergies:  Allergies  Allergen Reactions  . Simvastatin Diarrhea    History   Social History  . Marital Status: Single    Spouse Name: N/A  . Number of Children: N/A  . Years of Education: N/A   Occupational History  . Not on file.   Social History  Main Topics  . Smoking status: Heavy Tobacco Smoker -- 0.25 packs/day for 40 years    Types: Cigarettes  . Smokeless tobacco: Not on file  . Alcohol Use: 42.0 oz/week    70 Cans of beer per week  . Drug Use: Yes    Special: Marijuana  . Sexual Activity: Not on file   Other Topics Concern  . Not on file   Social History Narrative     Family History  Problem Relation Age of Onset  . Arthritis Mother   . Heart disease Father   . Hyperlipidemia Father   . Hypertension Father   . Cancer Paternal Grandfather      ROS:  Please see the history of present  illness.     All other systems reviewed and negative.    Physical Exam:   Blood pressure 136/87, pulse 66, height 5\' 6"  (1.676 m), weight 181 lb (82.101 kg). General: Well developed, well nourished male in no acute distress. Head: Normocephalic, atraumatic, sclera non-icteric, no xanthomas, nares are without discharge. EENT: normal Lymph Nodes:  none Back: without scoliosis/kyphosis , no CVA tendersness Neck: Negative for carotid bruits. JVD not elevated. Lungs: Clear bilaterally to auscultation without wheezes, rales, or rhonchi. Breathing is unlabored. Heart: RRR with S1 S2. No   murmur , rubs, or gallops appreciated. Abdomen: Soft, non-tender, non-distended with normoactive bowel sounds. No hepatomegaly. No rebound/guarding. No obvious abdominal masses. Msk:  Strength and tone appear normal for age. Extremities: No clubbing or cyanosis. N edema.  Distal pedal pulses are 2+ and equal bilaterally. Skin: Warm and Dry Neuro: Alert and oriented X 3. CN III-XII intact Grossly normal sensory and motor function . Psych:  Responds to questions appropriately with a normal affect.      Labs: Cardiac Enzymes No results for input(s): CKTOTAL, CKMB, TROPONINI in the last 72 hours. CBC Lab Results  Component Value Date   WBC 10.0 02/13/2014   HGB 14.8 02/13/2014   HCT 43.4 02/13/2014   MCV 96 02/13/2014   PLT 188 02/13/2014   PROTIME: No results for input(s): LABPROT, INR in the last 72 hours. Chemistry No results for input(s): NA, K, CL, CO2, BUN, CREATININE, CALCIUM, PROT, BILITOT, ALKPHOS, ALT, AST, GLUCOSE in the last 168 hours.  Invalid input(s): LABALBU Lipids No results found for: CHOL, HDL, LDLCALC, TRIG BNP No results found for: PROBNP Thyroid Function Tests: No results for input(s): TSH, T4TOTAL, T3FREE, THYROIDAB in the last 72 hours.  Invalid input(s): FREET3    Miscellaneous No results found for: DDIMER  Radiology/Studies:  No results found.  EKG:  Difficult to  interpret atrial paced rate at 66. Nonspecific ST changes were referred   Assessment and Plan:    Sinus bradycardia  Pacemaker-Biotronik The patient's device was interrogated and the information was fully reviewed.  The device was reprogrammed to increase the upper rate limit so as to achieve a broader heart rate excursion  Atrial fibrillation-paroxysmal  Dyspnea on exertion  Chest pain consistent with angina  Hypertension  Sleep apnea-currently untreated  Cigarette abuse  Hypokalemia  History of statin intolerance  Alcohol abuse  The patients pacemaker is functioning normally;  we have reprogrammed his upper rate to see if we can get a broader heart rate excursion and thus attenuate some of his dyspnea on exertion. I am also concerned however, about the exertional chest discomfort his pretest likelihood of being coronary disease is quite high and we will undertake Lexiscan imaging. His stress echo a couple of  years ago was nondiagnostic given the upper rate of only 60%.  He has a history of hypokalemia. Hence we will discontinue his HCTZ and put him on amlodipine. He is to see his PCP next month we will defer other medication adjustments to her.  I've encouraged him to pursue therapy for his diagnosed sleep apnea; this may well help  r blood pressure as well as issues of atrial fibrillation.  A short burst of atrial fibrillation (2 hours) was identified. It is not clear how long atrial fibrillation should be prior to the initiation of anticoagulant therapy; however, I don't think its shortest 2 hours and I am using threshold of 12-24 hours.  We await his lipid studies so as to determine whether it is appropriate to try to resume statin therapy. This will be further informed by his stress test results. He has not tolerated statins in the past.  I've encouraged him to stop smoking and to decrease his alcohol intake.    Virl Axe

## 2014-06-11 NOTE — Telephone Encounter (Signed)
Left message on machine for patient to contact the office for lexiscan time/date

## 2014-06-11 NOTE — Patient Instructions (Addendum)
Medication Instructions:  Your physician has recommended you make the following change in your medication:  1) STOP taking Hctz 2) START taking Amlodipine 5mg  daily   Labwork: None  Testing/Procedures: Your physician has requested that you have a lexiscan myoview.  [place]  Your caregiver has ordered a Stress Test with nuclear imaging. The purpose of this test is to evaluate the blood supply to your heart muscle. This procedure is referred to as a "Non-Invasive Stress Test." This is because other than having an IV started in your vein, nothing is inserted or "invades" your body. Cardiac stress tests are done to find areas of poor blood flow to the heart by determining the extent of coronary artery disease (CAD). Some patients exercise on a treadmill, which naturally increases the blood flow to your heart, while others who are  unable to walk on a treadmill due to physical limitations have a pharmacologic/chemical stress agent called Lexiscan . This medicine will mimic walking on a treadmill by temporarily increasing your coronary blood flow.   Please note: these test may take anywhere between 2-4 hours to complete  PLEASE REPORT TO [place] AT THE FIRST DESK WILL DIRECT YOU WHERE TO GO  Date of Procedure:_____________________________________  Arrival Time for Procedure:______________________________  PLEASE NOTIFY THE OFFICE AT LEAST 24 HOURS IN ADVANCE IF YOU ARE UNABLE TO KEEP YOUR APPOINTMENT.  [phone] AND  PLEASE NOTIFY NUCLEAR MEDICINE AT [facility] AT LEAST 24 HOURS IN ADVANCE IF YOU ARE UNABLE TO KEEP YOUR APPOINTMENT. [phone]  How to prepare for your Myoview test:  1. Do not eat or drink after midnight 2. No caffeine for 24 hours prior to test 3. No smoking 24 hours prior to test. 4. Your medication may be taken with water.  If your doctor stopped a medication because of this test, do not take that medication. 5. Ladies, please do  not wear dresses.  Skirts or pants are appropriate. Please wear a short sleeve shirt. 6. No perfume, cologne or lotion. 7. Wear comfortable walking shoes. No heels!           Follow-Up: Your physician wants you to follow-up in: 6 MONTHS with Dr. Caryl Comes.  You will receive a reminder letter in the mail two months in advance. If you don't receive a letter, please call our office to schedule the follow-up appointment.   Any Other Special Instructions Will Be Listed Below (If Applicable).

## 2014-06-11 NOTE — Telephone Encounter (Signed)
S/w pt regarding date, time, and location of lexiscan. Pt verbalized understanding and indicated he would be there 5/25 at 0830

## 2014-06-14 ENCOUNTER — Telehealth: Payer: Self-pay | Admitting: *Deleted

## 2014-06-14 NOTE — Telephone Encounter (Signed)
Pt is calling stating that he recently saw dr Caryl Comes in the office and he adjusted his device.  Past couple days "been un real" really tired.  1. Has your device fired? No   2. Is you device beeping? no  3. Are you experiencing draining or swelling at device site? no  4. Are you calling to see if we received your device transmission? no  5. Have you passed out? No, but having dizzy spells.   Please call patient.

## 2014-06-14 NOTE — Telephone Encounter (Signed)
Pt states he has been "majorly fatigued." Pt felt good prior to appt on Tues.   Pt has not changed meds as instructed (stop Hctz & start amlodipine).   Tentative appt set w/ [place] [facility] 06/18/14 until Home Monitoring is transferred from [facility]

## 2014-06-17 ENCOUNTER — Telehealth: Payer: Self-pay | Admitting: Internal Medicine

## 2014-06-17 NOTE — Telephone Encounter (Signed)
FYI patient cancelled appt as he has a stress test scheduled on Wednesday.  Patient does not feel he needs to reschedule at this time.

## 2014-06-18 ENCOUNTER — Telehealth: Payer: Self-pay | Admitting: *Deleted

## 2014-06-18 NOTE — Telephone Encounter (Signed)
Patient called and unable to make nuclear stress test due to family emergency scheduled for tomorrow 06/19/14.

## 2014-06-18 NOTE — Telephone Encounter (Signed)
Notified Scheduling to cancel test 5/24

## 2014-06-19 ENCOUNTER — Ambulatory Visit: Payer: 59

## 2014-06-19 ENCOUNTER — Other Ambulatory Visit: Payer: Self-pay

## 2014-06-26 ENCOUNTER — Telehealth: Payer: Self-pay

## 2014-06-26 NOTE — Telephone Encounter (Signed)
Left message on machine for patient to contact the office to reschedule myoview.

## 2014-06-28 ENCOUNTER — Other Ambulatory Visit: Payer: Self-pay | Admitting: Primary Care

## 2014-06-28 DIAGNOSIS — I10 Essential (primary) hypertension: Secondary | ICD-10-CM

## 2014-06-28 DIAGNOSIS — I519 Heart disease, unspecified: Secondary | ICD-10-CM

## 2014-06-28 DIAGNOSIS — Z0000 Encounter for general adult medical examination without abnormal findings: Secondary | ICD-10-CM

## 2014-07-01 ENCOUNTER — Telehealth: Payer: Self-pay

## 2014-07-01 NOTE — Telephone Encounter (Signed)
Left message on machine for patient to contact the office to reschedule stress test.

## 2014-07-02 ENCOUNTER — Telehealth: Payer: Self-pay | Admitting: Cardiovascular Disease

## 2014-07-02 NOTE — Telephone Encounter (Signed)
Patient wants to reschedule NM stress test

## 2014-07-02 NOTE — Telephone Encounter (Signed)
Patient is ready to reschedule nm stress test

## 2014-07-02 NOTE — Telephone Encounter (Signed)
S/w patient who would like stress test 6/13 at 9:00am Reviewed instructions with patient. Stated he still has instruction sheet from stress test he had to schedule. Scheduled for 6/13 at 9:00am

## 2014-07-05 ENCOUNTER — Other Ambulatory Visit (INDEPENDENT_AMBULATORY_CARE_PROVIDER_SITE_OTHER): Payer: 59

## 2014-07-05 ENCOUNTER — Encounter: Payer: Self-pay | Admitting: Family Medicine

## 2014-07-05 ENCOUNTER — Ambulatory Visit (INDEPENDENT_AMBULATORY_CARE_PROVIDER_SITE_OTHER): Payer: 59 | Admitting: Family Medicine

## 2014-07-05 VITALS — BP 146/88 | HR 73 | Temp 97.9°F | Wt 182.5 lb

## 2014-07-05 DIAGNOSIS — Z0000 Encounter for general adult medical examination without abnormal findings: Secondary | ICD-10-CM | POA: Diagnosis not present

## 2014-07-05 DIAGNOSIS — I10 Essential (primary) hypertension: Secondary | ICD-10-CM | POA: Diagnosis not present

## 2014-07-05 DIAGNOSIS — A938 Other specified arthropod-borne viral fevers: Secondary | ICD-10-CM

## 2014-07-05 DIAGNOSIS — W57XXXA Bitten or stung by nonvenomous insect and other nonvenomous arthropods, initial encounter: Secondary | ICD-10-CM

## 2014-07-05 DIAGNOSIS — I519 Heart disease, unspecified: Secondary | ICD-10-CM | POA: Diagnosis not present

## 2014-07-05 DIAGNOSIS — Z720 Tobacco use: Secondary | ICD-10-CM | POA: Diagnosis not present

## 2014-07-05 DIAGNOSIS — T148 Other injury of unspecified body region: Secondary | ICD-10-CM

## 2014-07-05 DIAGNOSIS — J441 Chronic obstructive pulmonary disease with (acute) exacerbation: Secondary | ICD-10-CM

## 2014-07-05 LAB — COMPREHENSIVE METABOLIC PANEL
ALK PHOS: 75 U/L (ref 39–117)
ALT: 16 U/L (ref 0–53)
AST: 13 U/L (ref 0–37)
Albumin: 4 g/dL (ref 3.5–5.2)
BUN: 12 mg/dL (ref 6–23)
CALCIUM: 9.2 mg/dL (ref 8.4–10.5)
CO2: 29 mEq/L (ref 19–32)
Chloride: 106 mEq/L (ref 96–112)
Creatinine, Ser: 0.71 mg/dL (ref 0.40–1.50)
GFR: 120.77 mL/min (ref >60.00)
Glucose, Bld: 97 mg/dL (ref 70–99)
POTASSIUM: 4.1 meq/L (ref 3.5–5.1)
Sodium: 140 mEq/L (ref 135–145)
TOTAL PROTEIN: 6.5 g/dL (ref 6.0–8.3)
Total Bilirubin: 0.6 mg/dL (ref 0.2–1.2)

## 2014-07-05 LAB — CBC
HEMATOCRIT: 51.3 % (ref 39.0–52.0)
Hemoglobin: 17.2 g/dL — ABNORMAL HIGH (ref 13.0–17.0)
MCHC: 33.5 g/dL (ref 30.0–36.0)
MCV: 96.2 fl (ref 78.0–100.0)
PLATELETS: 219 10*3/uL (ref 150.0–400.0)
RBC: 5.33 Mil/uL (ref 4.22–5.81)
RDW: 13.6 % (ref 11.5–15.5)
WBC: 7.1 10*3/uL (ref 4.0–10.5)

## 2014-07-05 LAB — LIPID PANEL
CHOLESTEROL: 223 mg/dL — AB (ref 0–200)
HDL: 43.4 mg/dL (ref >39.00)
LDL CALC: 163 mg/dL — AB (ref 0–99)
NonHDL: 179.6
Total CHOL/HDL Ratio: 5
Triglycerides: 85 mg/dL (ref 0.0–149.0)
VLDL: 17 mg/dL (ref 0.0–40.0)

## 2014-07-05 LAB — PSA: PSA: 0.52 ng/mL (ref 0.10–4.00)

## 2014-07-05 LAB — TSH: TSH: 1.62 u[IU]/mL (ref 0.35–4.50)

## 2014-07-05 LAB — HEMOGLOBIN A1C: Hgb A1c MFr Bld: 5.5 % (ref 4.6–6.5)

## 2014-07-05 MED ORDER — DOXYCYCLINE HYCLATE 100 MG PO TABS: 100.0000 mg | ORAL_TABLET | Freq: Two times a day (BID) | ORAL | Status: DC

## 2014-07-05 NOTE — Progress Notes (Signed)
Dr. Frederico Hamman T. Yeshua Stryker, MD, [facility] [place], [address] Phone: [phone] Fax: [phone]  07/05/2014  Patient: Brandon Lopez, MRN: [id number], DOB: 01/08/1956, 59 y.o.  Primary Physician:  Sheral Flow, NP  Chief Complaint: Insect Bite and Fatigue  Subjective:   Brandon Lopez is a 59 y.o. very pleasant male patient who presents with the following:  Tick borne illness: the patient is very active and in the outdoors, and over week ago he pulled multiple ticks off of his abdomen.  There is one place now but has a large skin lesion, somewhat like a bull's-eye lesion.  He has been having arthralgias and feeling poorly overall.  He thinks that he has been running a fever some also.  Feel sick like he is kind of got the flu.  He also is having some increased shortness of breath and he has known COPD.  Tick bite and had a bullseye lesion on his R leg.  There in [facility] - picked off 14 ticks off of his belly.   Past Medical History, Surgical History, Social History, Family History, Problem List, Medications, and Allergies have been reviewed and updated if relevant.  Patient Active Problem List   Diagnosis Date Noted  . Pacemaker 06/03/2014  . Depression 06/03/2014  . Alcohol abuse 06/03/2014  . OSA (obstructive sleep apnea) 06/03/2014  . Essential hypertension 06/03/2014  . Tobacco abuse 06/03/2014    Past Medical History  Diagnosis Date  . Hypertension   . Presence of permanent cardiac pacemaker     Biotronik; Dayna Barker DR-T [id number]; placed 08/2012  . Sleep apnea   . COPD (chronic obstructive pulmonary disease)   . Arthritis   . Depression   . Kidney stones   . 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 Diverticulosis     Found during colonoscopy  . CHF (congestive heart failure)     Past Surgical History  Procedure Laterality Date  . Appendectomy  age 14  . Insert / replace / remove  pacemaker      3 years ago  . Colonoscopy N/A 05/27/2014    Procedure: COLONOSCOPY;  Surgeon: Hulen Luster, MD;  Location: [facility] ENDOSCOPY;  Service: Gastroenterology;  Laterality: N/A;  . Cardiac catheterization      7 years ago    History   Social History  . Marital Status: Single    Spouse Name: N/A  . Number of Children: N/A  . Years of Education: N/A   Occupational History  . Not on file.   Social History Main Topics  . Smoking status: Heavy Tobacco Smoker -- 0.25 packs/day for 40 years    Types: Cigarettes  . Smokeless tobacco: Not on file  . Alcohol Use: 42.0 oz/week    70 Cans of beer per week  . Drug Use: Yes    Special: Marijuana  . Sexual Activity: Not on file   Other Topics Concern  . Not on file   Social History Narrative    Family History  Problem Relation Age of Onset  . Arthritis Mother   . Heart disease Father   . Hyperlipidemia Father   . Hypertension Father   . Cancer Paternal Grandfather     Allergies  Allergen Reactions  . Simvastatin Diarrhea    Medication list reviewed and updated in full in [place].   ROS: GEN: Acute illness details above GI: Tolerating PO intake GU: maintaining adequate hydration and  urination Pulm: Increased SOB Interactive and getting along well at home.  Otherwise, ROS is as per the HPI.   Objective:   BP 146/88 mmHg  Pulse 73  Temp(Src) 97.9 F (36.6 C) (Oral)  Wt 182 lb 8 oz (82.781 kg)  SpO2 98%   GEN: A and O x 3. WDWN. NAD.    ENT: Nose clear, ext NML.  No LAD.  No JVD.  TM's clear. Oropharynx clear.  PULM: Normal WOB, no distress. No crackles, wheezes, rhonchi. Decreased air flow CV: RRR, no M/G/R, No rubs, No JVD.   EXT: warm and well-perfused, No c/c/e. PSYCH: Pleasant and conversant.  SKIN: flat rash noted, central clearing - 4 cm across   Laboratory and Imaging Data:  Assessment and Plan:   Tick borne fever  Tick bite  Tobacco abuse  COPD exacerbation  This him that  this is one of the tick borne illnesses and treated as such.  He also seems to be having a COPD flare.  Mild.  He has not been using any of his albuterol, so I recommended that he use this up to every 4 hours.  Follow-up: No Follow-up on file.  New Prescriptions   DOXYCYCLINE (VIBRA-TABS) 100 MG TABLET    Take 1 tablet (100 mg total) by mouth 2 (two) times daily.   No orders of the defined types were placed in this encounter.    Signed,  Maud Deed. Jamesmichael Shadd, MD   Patient's Medications  New Prescriptions   DOXYCYCLINE (VIBRA-TABS) 100 MG TABLET    Take 1 tablet (100 mg total) by mouth 2 (two) times daily.  Previous Medications   ALBUTEROL (PROVENTIL HFA;VENTOLIN HFA) 108 (90 BASE) MCG/ACT INHALER    Inhale 2 puffs into the lungs every 6 (six) hours as needed.   AMLODIPINE (NORVASC) 5 MG TABLET    Take 1 tablet (5 mg total) by mouth daily.   ASPIRIN 81 MG TABLET    Take 81 mg by mouth daily.   BUDESONIDE-FORMOTEROL (SYMBICORT) 80-4.5 MCG/ACT INHALER    Inhale 2 puffs into the lungs 2 (two) times daily.   CITALOPRAM (CELEXA) 40 MG TABLET    Take 40 mg by mouth daily.   LISINOPRIL (PRINIVIL,ZESTRIL) 40 MG TABLET    Take 40 mg by mouth daily.   LORATADINE (CLARITIN) 10 MG TABLET    Take 20 mg by mouth daily.  Modified Medications   No medications on file  Discontinued Medications   No medications on file

## 2014-07-05 NOTE — Progress Notes (Signed)
Pre visit review using our clinic review tool, if applicable. No additional management support is needed unless otherwise documented below in the visit note. 

## 2014-07-08 ENCOUNTER — Encounter
Admission: RE | Admit: 2014-07-08 | Discharge: 2014-07-08 | Disposition: A | Discharge status: Home / Self Care | Payer: 59 | Source: Ambulatory Visit | Attending: Internal Medicine | Admitting: Internal Medicine

## 2014-07-08 DIAGNOSIS — R079 Chest pain, unspecified: Secondary | ICD-10-CM | POA: Diagnosis present

## 2014-07-08 LAB — NM MYOCAR MULTI W/SPECT W/WALL MOTION / EF
CHL CUP RESTING HR STRESS: 71 {beats}/min
LVDIAVOL: 155 mL
LVSYSVOL: 65 mL
NUC STRESS EF: 49 %
NUC STRESS TID: 1.07
Peak HR: 75 {beats}/min
SDS: 0
SRS: 11
SSS: 0

## 2014-07-08 MED ORDER — TECHNETIUM TC 99M SESTAMIBI - CARDIOLITE
13.6290 | Freq: Once | INTRAVENOUS | Status: AC | PRN
  Administered 2014-07-08: 13.62 via INTRAVENOUS

## 2014-07-08 MED ORDER — REGADENOSON 0.4 MG/5ML IV SOLN
0.4000 mg | Freq: Once | INTRAVENOUS | Status: AC
  Administered 2014-07-08: 0.4 mg via INTRAVENOUS

## 2014-07-08 MED ORDER — TECHNETIUM TC 99M SESTAMIBI - CARDIOLITE
31.7300 | Freq: Once | INTRAVENOUS | Status: AC | PRN
  Administered 2014-07-08: 09:00:00 31.73 via INTRAVENOUS

## 2014-07-11 ENCOUNTER — Ambulatory Visit (INDEPENDENT_AMBULATORY_CARE_PROVIDER_SITE_OTHER): Payer: 59 | Admitting: Primary Care

## 2014-07-11 ENCOUNTER — Encounter: Payer: Self-pay | Admitting: Primary Care

## 2014-07-11 VITALS — BP 136/86 | HR 78 | Temp 98.2°F | Ht 66.0 in | Wt 182.0 lb

## 2014-07-11 DIAGNOSIS — G4733 Obstructive sleep apnea (adult) (pediatric): Secondary | ICD-10-CM | POA: Diagnosis not present

## 2014-07-11 DIAGNOSIS — F329 Major depressive disorder, single episode, unspecified: Secondary | ICD-10-CM

## 2014-07-11 DIAGNOSIS — Z0000 Encounter for general adult medical examination without abnormal findings: Secondary | ICD-10-CM | POA: Insufficient documentation

## 2014-07-11 DIAGNOSIS — E785 Hyperlipidemia, unspecified: Secondary | ICD-10-CM | POA: Diagnosis not present

## 2014-07-11 DIAGNOSIS — Z0001 Encounter for general adult medical examination with abnormal findings: Secondary | ICD-10-CM | POA: Insufficient documentation

## 2014-07-11 DIAGNOSIS — F32A Depression, unspecified: Secondary | ICD-10-CM

## 2014-07-11 DIAGNOSIS — F1010 Alcohol abuse, uncomplicated: Secondary | ICD-10-CM

## 2014-07-11 DIAGNOSIS — I10 Essential (primary) hypertension: Secondary | ICD-10-CM | POA: Diagnosis not present

## 2014-07-11 NOTE — Assessment & Plan Note (Signed)
Today he feels well managed on citalopram 40 mg. PHQ-2 score of 0. He is interested in therapy again and would like to meet with Terri. Referral sent. Follow up in 3 months for re-eval.

## 2014-07-11 NOTE — Assessment & Plan Note (Signed)
Never received sleep study records, will request again.

## 2014-07-11 NOTE — Assessment & Plan Note (Signed)
Continues to drink 8-10 12oz cans of beer nightly. He is not interested in quitting despite recommendations from psychologist. LFT's WNL. Liver unremarkable during exam.

## 2014-07-11 NOTE — Patient Instructions (Signed)
You will be contacted regarding your referral to Psychology.  Please let us know if you have not heard back within one week.   It is important that you improve your diet. Please limit carbohydrates in the form of white bread, rice, pasta, cakes, cookies, sugary drinks, etc. Increase your consumption of fresh fruits and vegetables. Be sure to drink plenty of water daily.  Try to get 30-45 minutes of walking at least 5 days weekly.  Follow up in 3 months for evaluation of depression and cholesterol.  It was nice seeing you!  Cardiac Diet This diet can help prevent heart disease and stroke. Many factors influence your heart health, including eating and exercise habits. Coronary risk rises a lot with abnormal blood fat (lipid) levels. Cardiac meal planning includes limiting unhealthy fats, increasing healthy fats, and making other small dietary changes. General guidelines are as follows:  Adjust calorie intake to reach and maintain desirable body weight.  Limit total fat intake to less than 30% of total calories. Saturated fat should be less than 7% of calories.  Saturated fats are found in animal products and in some vegetable products. Saturated vegetable fats are found in coconut oil, cocoa butter, palm oil, and palm kernel oil. Read labels carefully to avoid these products as much as possible. Use butter in moderation. Choose tub margarines and oils that have 2 grams of fat or less. Good cooking oils are canola and olive oils.  Practice low-fat cooking techniques. Do not fry food. Instead, broil, bake, boil, steam, grill, roast on a rack, stir-fry, or microwave it. Other fat reducing suggestions include:  Remove the skin from poultry.  Remove all visible fat from meats.  Skim the fat off stews, soups, and gravies before serving them.  Steam vegetables in water or broth instead of sauting them in fat.  Avoid foods with trans fat (or hydrogenated oils), such as commercially fried foods  and commercially baked goods. Commercial shortening and deep-frying fats will contain trans fat.  Increase intake of fruits, vegetables, whole grains, and legumes to replace foods high in fat.  Increase consumption of nuts, legumes, and seeds to at least 4 servings weekly. One serving of a legume equals  cup, and 1 serving of nuts or seeds equals  cup.  Choose whole grains more often. Have 3 servings per day (a serving is 1 ounce [oz]).  Eat 4 to 5 servings of vegetables per day. A serving of vegetables is 1 cup of raw leafy vegetables;  cup of raw or cooked cut-up vegetables;  cup of vegetable juice.  Eat 4 to 5 servings of fruit per day. A serving of fruit is 1 medium whole fruit;  cup of dried fruit;  cup of fresh, frozen, or canned fruit;  cup of 100% fruit juice.  Increase your intake of dietary fiber to 20 to 30 grams per day. Insoluble fiber may help lower your risk of heart disease and may help curb your appetite.  Soluble fiber binds cholesterol to be removed from the blood. Foods high in soluble fiber are dried beans, citrus fruits, oats, apples, bananas, broccoli, Brussels sprouts, and eggplant.  Try to include foods fortified with plant sterols or stanols, such as yogurt, breads, juices, or margarines. Choose several fortified foods to achieve a daily intake of 2 to 3 grams of plant sterols or stanols.  Foods with omega-3 fats can help reduce your risk of heart disease. Aim to have a 3.5 oz portion of fatty fish twice per  week, such as salmon, mackerel, albacore tuna, sardines, lake trout, or herring. If you wish to take a fish oil supplement, choose one that contains 1 gram of both DHA and EPA.  Limit processed meats to 2 servings (3 oz portion) weekly.  Limit the sodium in your diet to 1500 milligrams (mg) per day. If you have high blood pressure, talk to a registered dietitian about a DASH (Dietary Approaches to Stop Hypertension) eating plan.  Limit sweets and beverages  with added sugar, such as soda, to no more than 5 servings per week. One serving is:   1 tablespoon sugar.  1 tablespoon jelly or jam.   cup sorbet.  1 cup lemonade.   cup regular soda. CHOOSING FOODS Starches  Allowed: Breads: All kinds (wheat, rye, raisin, white, oatmeal, [place], [place], and English muffin bread). Low-fat rolls: English muffins, frankfurter and hamburger buns, bagels, pita bread, tortillas (not fried). Pancakes, waffles, biscuits, and muffins made with recommended oil.  Avoid: Products made with saturated or trans fats, oils, or whole milk products. Butter rolls, cheese breads, croissants. Commercial doughnuts, muffins, sweet rolls, biscuits, waffles, pancakes, store-bought mixes. Crackers  Allowed: Low-fat crackers and snacks: Animal, graham, rye, saltine (with recommended oil, no lard), oyster, and matzo crackers. Bread sticks, melba toast, rusks, flatbread, pretzels, and light popcorn.  Avoid: High-fat crackers: cheese crackers, butter crackers, and those made with coconut, palm oil, or trans fat (hydrogenated oils). Buttered popcorn. Cereals  Allowed: Hot or cold whole-grain cereals.  Avoid: Cereals containing coconut, hydrogenated vegetable fat, or animal fat. Potatoes / Pasta / Rice  Allowed: All kinds of potatoes, rice, and pasta (such as macaroni, spaghetti, and noodles).  Avoid: Pasta or rice prepared with cream sauce or high-fat cheese. Chow mein noodles, [place] fries. Vegetables  Allowed: All vegetables and vegetable juices.  Avoid: Fried vegetables. Vegetables in cream, butter, or high-fat cheese sauces. Limit coconut. Fruit in cream or custard. Protein  Allowed: Limit your intake of meat, seafood, and poultry to no more than 6 oz (cooked weight) per day. All lean, well-trimmed beef, veal, pork, and lamb. All chicken and [place] without skin. All fish and shellfish. Wild game: wild duck, rabbit, pheasant, and venison. Egg whites or  low-cholesterol egg substitutes may be used as desired. Meatless dishes: recipes with dried beans, peas, lentils, and tofu (soybean curd). Seeds and nuts: all seeds and most nuts.  Avoid: Prime grade and other heavily marbled and fatty meats, such as short ribs, spare ribs, rib eye roast or steak, frankfurters, sausage, bacon, and high-fat luncheon meats, mutton. Caviar. Commercially fried fish. Domestic duck, goose, venison sausage. Organ meats: liver, gizzard, heart, chitterlings, brains, kidney, sweetbreads. Dairy  Allowed: Low-fat cheeses: nonfat or low-fat cottage cheese (1% or 2% fat), cheeses made with part skim milk, such as mozzarella, farmers, string, or ricotta. (Cheeses should be labeled no more than 2 to 6 grams fat per oz.). Skim (or 1%) milk: liquid, powdered, or evaporated. Buttermilk made with low-fat milk. Drinks made with skim or low-fat milk or cocoa. Chocolate milk or cocoa made with skim or low-fat (1%) milk. Nonfat or low-fat yogurt.  Avoid: Whole milk cheeses, including colby, cheddar, mu[place]ster, Monterey Jack, [place], [place], [place]a, American, Swiss, and blue. Creamed cottage cheese, cream cheese. Whole milk and whole milk products, including buttermilk or yogurt made from whole milk, drinks made from whole milk. Condensed milk, evaporated whole milk, and 2% milk. Soups and Combination Foods  Allowed: Low-fat low-sodium soups: broth, dehydrated soups, homemade broth, soups  with the fat removed, homemade cream soups made with skim or low-fat milk. Low-fat spaghetti, lasagna, chili, and Spanish rice if low-fat ingredients and low-fat cooking techniques are used.  Avoid: Cream soups made with whole milk, cream, or high-fat cheese. All other soups. Desserts and Sweets  Allowed: Sherbet, fruit ices, gelatins, meringues, and angel food cake. Homemade desserts with recommended fats, oils, and milk products. Jam, jelly, honey, marmalade, sugars, and syrups. Pure sugar candy, such as  gum drops, hard candy, jelly beans, marshmallows, mints, and small amounts of dark chocolate.  Avoid: Commercially prepared cakes, pies, cookies, frosting, pudding, or mixes for these products. Desserts containing whole milk products, chocolate, coconut, lard, palm oil, or palm kernel oil. Ice cream or ice cream drinks. Candy that contains chocolate, coconut, butter, hydrogenated fat, or unknown ingredients. Buttered syrups. Fats and Oils  Allowed: Vegetable oils: safflower, sunflower, corn, soybean, cottonseed, sesame, canola, olive, or peanut. Non-hydrogenated margarines. Salad dressing or mayonnaise: homemade or commercial, made with a recommended oil. Low or nonfat salad dressing or mayonnaise.  Limit added fats and oils to 6 to 8 tsp per day (includes fats used in cooking, baking, salads, and spreads on bread). Remember to count the "hidden fats" in foods.  Avoid: Solid fats and shortenings: butter, lard, salt pork, bacon drippings. Gravy containing meat fat, shortening, or suet. Cocoa butter, coconut. Coconut oil, palm oil, palm kernel oil, or hydrogenated oils: these ingredients are often used in bakery products, nondairy creamers, whipped toppings, candy, and commercially fried foods. Read labels carefully. Salad dressings made of unknown oils, sour cream, or cheese, such as blue cheese and Roquefort. Cream, all kinds: half-and-half, light, heavy, or whipping. Sour cream or cream cheese (even if "light" or low-fat). Nondairy cream substitutes: coffee creamers and sour cream substitutes made with palm, palm kernel, hydrogenated oils, or coconut oil. Beverages  Allowed: Coffee (regular or decaffeinated), tea. Diet carbonated beverages, mineral water. Alcohol: Check with your caregiver. Moderation is recommended.  Avoid: Whole milk, regular sodas, and juice drinks with added sugar. Condiments  Allowed: All seasonings and condiments. Cocoa powder. "Cream" sauces made with recommended  ingredients.  Avoid: Carob powder made with hydrogenated fats. SAMPLE MENU Breakfast   cup orange juice   cup oatmeal  1 slice toast  1 tsp margarine  1 cup skim milk Lunch  [place] sandwich with 2 oz [place], 2 slices bread  Lettuce and tomato slices  Fresh fruit  Carrot sticks  Coffee or tea Snack  Fresh fruit or low-fat crackers Dinner  3 oz lean ground beef  1 baked potato  1 tsp margarine   cup asparagus  Lettuce salad  1 tbs non-creamy dressing   cup peach slices  1 cup skim milk Document Released: 10/21/2007 Document Revised: 07/13/2011 Document Reviewed: 03/13/2013 ExitCare Patient Information 2015 [place], [place]. This information is not intended to replace advice given to you by your health care provider. Make sure you discuss any questions you have with your health care provider.

## 2014-07-11 NOTE — Progress Notes (Signed)
Pre visit review using our clinic review tool, if applicable. No additional management support is needed unless otherwise documented below in the visit note. 

## 2014-07-11 NOTE — Assessment & Plan Note (Signed)
LDL above goal. Discussed importance of healthy diet and regular diet. Handout provided regarding heart healthy diet.

## 2014-07-11 NOTE — Assessment & Plan Note (Addendum)
Vaccines up to date. Colonoscopy in May 2016, unremarkable. Encouraged annual eye and dental exams. Discussed healthy diet and exercise. Discussed importance of tobacco and alcohol cessation. Exam mostly unremarkable.

## 2014-07-11 NOTE — Assessment & Plan Note (Signed)
Potassium WNL. Stable, continue same.

## 2014-07-11 NOTE — Progress Notes (Signed)
Subjective:    Patient ID: Brandon Lopez, male    DOB: 05-24-1955, 59 y.o.   MRN: [id number]  HPI  Brandon Lopez is a 59 year old male who presents today for complete physical.  Immunizations: -Tetanus: Completed 3 years ago. -Influenza: Completed last season. -Pneumonia: Received 4 months ago.  Diet: His diet consists of lean meats, vegetables, candy, ice cream, and 8-12 cans of beer daily. He is not interested in cessation of alcohol consumption. Exercise: He's not currently exercising. Eye exam: Last eye exam in 5+ years, will schedule soon. Dental exam: Last exam 3 years ago. Colonoscopy: Completed May 2016, no findings. Due again in 10 years. Endoscopy January 2016, duodenum with some swelling, otherwise no findings.  1) Depression: Was seeing Pervis Hocking for therapy and was encouraged to go to rehab for alcoholism. He stopped going to therapy because he felt as though he didn't need rehab and was there to discuss other issues. He is managed on Citalopram 40 mg tablets. During our last visit he didn't feel as though he was completely managed, but today he reports to be feeling well managed. PHQ-2 score of 0 today.  2) OSA: Completed sleep study at [facility] 3 years ago and never received equipment. Will obtain sleep study records from [facility] and send them to [place].  3) Chest pain: follows with Dr. Caryl Comes for pacemaker maintence. He had a cardiac stress test this Monday, no results at this tie.  Follow up appointment [place] in August 2016. Denies chest pain today.  Review of Systems  Constitutional: Negative for unexpected weight change.  HENT: Negative for rhinorrhea.   Respiratory: Negative for cough and shortness of breath.   Cardiovascular:       Chest pain with burning intermittently for years. Negative work up.  Gastrointestinal: Negative for diarrhea and constipation.  Genitourinary: Negative for dysuria and frequency.  Musculoskeletal: Negative for  myalgias and arthralgias.  Skin: Negative for rash.  Neurological: Negative for dizziness and headaches.  Psychiatric/Behavioral:       See HPI       Past Medical History  Diagnosis Date  . Hypertension   . Presence of permanent cardiac pacemaker     Biotronik; Dayna Barker DR-T [id number]; placed 08/2012  . Sleep apnea   . COPD (chronic obstructive pulmonary disease)   . Arthritis   . Depression   . Kidney stones   . 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 Diverticulosis     Found during colonoscopy  . CHF (congestive heart failure)     History   Social History  . Marital Status: Single    Spouse Name: N/A  . Number of Children: N/A  . Years of Education: N/A   Occupational History  . Not on file.   Social History Main Topics  . Smoking status: Heavy Tobacco Smoker -- 1.00 packs/day for 40 years    Types: Cigarettes  . Smokeless tobacco: Not on file  . Alcohol Use: 50.4 oz/week    84 Cans of beer per week  . Drug Use: Yes    Special: Marijuana  . Sexual Activity: Not on file   Other Topics Concern  . Not on file   Social History Narrative    Past Surgical History  Procedure Laterality Date  . Appendectomy  age 59  . Insert / replace / remove pacemaker      3 years ago  . Colonoscopy N/A 05/27/2014    Procedure: COLONOSCOPY;  Surgeon: Lupita Dawn  Candace Cruise, MD;  Location: [facility];  Service: Gastroenterology;  Laterality: N/A;  . Cardiac catheterization      7 years ago    Family History  Problem Relation Age of Onset  . Arthritis Mother   . Heart disease Father   . Hyperlipidemia Father   . Hypertension Father   . Cancer Paternal Grandfather     Allergies  Allergen Reactions  . Simvastatin Diarrhea    Current Outpatient Prescriptions on File Prior to Visit  Medication Sig Dispense Refill  . albuterol (PROVENTIL HFA;VENTOLIN HFA) 108 (90 BASE) MCG/ACT inhaler Inhale 2 puffs into the lungs every 6 (six) hours as needed.    Marland Kitchen amLODipine (NORVASC) 5 MG tablet Take 1 tablet  (5 mg total) by mouth daily. 30 tablet 11  . aspirin 81 MG tablet Take 81 mg by mouth daily.    . budesonide-formoterol (SYMBICORT) 80-4.5 MCG/ACT inhaler Inhale 2 puffs into the lungs 2 (two) times daily.    . citalopram (CELEXA) 40 MG tablet Take 40 mg by mouth daily.    Marland Kitchen doxycycline (VIBRA-TABS) 100 MG tablet Take 1 tablet (100 mg total) by mouth 2 (two) times daily. 28 tablet 0  . lisinopril (PRINIVIL,ZESTRIL) 40 MG tablet Take 40 mg by mouth daily.    Marland Kitchen loratadine (CLARITIN) 10 MG tablet Take 20 mg by mouth daily.     No current facility-administered medications on file prior to visit.    BP 136/86 mmHg  Pulse 78  Temp(Src) 98.2 F (36.8 C) (Oral)  Ht 5\' 6"  (1.676 m)  Wt 182 lb (82.555 kg)  BMI 29.39 kg/m2  SpO2 94%    Objective:   Physical Exam  Constitutional: He is oriented to person, place, and time. He appears well-nourished.  HENT:  Right Ear: Tympanic membrane and ear canal normal.  Left Ear: Tympanic membrane and ear canal normal.  Nose: Nose normal.  Mouth/Throat: Oropharynx is clear and moist.  Eyes: Conjunctivae and EOM are normal. Pupils are equal, round, and reactive to light.  Neck: Neck supple. No thyromegaly present.  Cardiovascular: Normal rate and regular rhythm.   Pulmonary/Chest: Effort normal.  Rhonchi noted to bilateral upper lobes  Abdominal: Soft. Bowel sounds are normal. He exhibits no mass. There is no tenderness.  Musculoskeletal: Normal range of motion.  Lymphadenopathy:    He has no cervical adenopathy.  Neurological: He is alert and oriented to person, place, and time. He has normal reflexes. No cranial nerve deficit.  Skin: Skin is warm and dry.  Psychiatric: He has a normal mood and affect.          Assessment & Plan:

## 2014-07-12 ENCOUNTER — Telehealth: Payer: Self-pay

## 2014-07-12 DIAGNOSIS — K219 Gastro-esophageal reflux disease without esophagitis: Secondary | ICD-10-CM

## 2014-07-12 MED ORDER — PANTOPRAZOLE SODIUM 40 MG PO TBEC: 40.0000 mg | DELAYED_RELEASE_TABLET | Freq: Every day | ORAL | Status: DC

## 2014-07-12 NOTE — Telephone Encounter (Signed)
Pt was seen 07/11/14 and discussed with Allie Bossier NP about a med given to pt from Dr Candace Cruise and pt could not remember name of med; pt wanted to see if Allie Bossier NP could prescribe pantoprazole 40 mg takes one bid. Helps with acid reflux. [facility] [address].Please advise.

## 2014-07-12 NOTE — Telephone Encounter (Signed)
Refill sent. We will reduce dose to 40 mg once daily with a goal to wean to 20 mg daily. Will continue to monitor.

## 2014-07-18 ENCOUNTER — Telehealth: Payer: Self-pay | Admitting: Primary Care

## 2014-07-18 NOTE — Telephone Encounter (Signed)
Patient is scheduled for Echocardiogram on 07/22/14.  They are asking for an order for the procedure to be put in Epic by Monday.

## 2014-07-18 NOTE — Telephone Encounter (Signed)
Who is "they"? Wouldn't cardiology place this order if they are the ones requiring? Will you please investigate? Thanks.

## 2014-07-19 ENCOUNTER — Other Ambulatory Visit: Payer: Self-pay

## 2014-07-19 ENCOUNTER — Telehealth: Payer: Self-pay

## 2014-07-19 DIAGNOSIS — R079 Chest pain, unspecified: Secondary | ICD-10-CM | POA: Diagnosis not present

## 2014-07-19 DIAGNOSIS — R0600 Dyspnea, unspecified: Secondary | ICD-10-CM

## 2014-07-19 NOTE — Telephone Encounter (Signed)
Left message on machine for patient to contact the office regarding Monday echo.

## 2014-07-19 NOTE — Telephone Encounter (Signed)
Brandon Lopez did not order an Echo. It was the cardiology office in [place].

## 2014-07-22 ENCOUNTER — Ambulatory Visit: Payer: 59

## 2014-07-22 ENCOUNTER — Other Ambulatory Visit: Payer: Self-pay

## 2014-07-22 DIAGNOSIS — R0600 Dyspnea, unspecified: Secondary | ICD-10-CM

## 2014-08-01 ENCOUNTER — Ambulatory Visit (INDEPENDENT_AMBULATORY_CARE_PROVIDER_SITE_OTHER): Payer: Self-pay | Admitting: Psychology

## 2014-08-01 ENCOUNTER — Telehealth: Payer: Self-pay | Admitting: Primary Care

## 2014-08-01 DIAGNOSIS — F4323 Adjustment disorder with mixed anxiety and depressed mood: Secondary | ICD-10-CM

## 2014-08-01 NOTE — Telephone Encounter (Signed)
Pt stopped inquiring about bi pap machine. He said he spoke with Vallarie Mare about it and has now decided that he would like the dx from [facility] to be sent to [facility] showing he needs a bi pap and not a c pap.  The best number to contact pt at is [phone].

## 2014-08-05 NOTE — Telephone Encounter (Signed)
I left a message for the patient to return my call.

## 2014-08-08 NOTE — Telephone Encounter (Signed)
Tried to call patient on 08/06/14 and 08/07/14. Left voicemail for a call back.  So I went a head and called [facility]. I was able to obtain the c-pap department number [phone] and fax number [phone] Attn C pap Resupply.  Faxed the paperwork for patient to [place].  Attempt to call patient to let him know. Was able to talk to patient. Notified him that the paperwork has been faxed.

## 2014-09-05 ENCOUNTER — Ambulatory Visit (INDEPENDENT_AMBULATORY_CARE_PROVIDER_SITE_OTHER): Payer: 59 | Admitting: Psychology

## 2014-09-05 DIAGNOSIS — F4323 Adjustment disorder with mixed anxiety and depressed mood: Secondary | ICD-10-CM | POA: Diagnosis not present

## 2014-09-10 ENCOUNTER — Encounter: Payer: 59 | Admitting: *Deleted

## 2014-09-26 ENCOUNTER — Telehealth: Payer: Self-pay | Admitting: Primary Care

## 2014-09-26 ENCOUNTER — Ambulatory Visit (INDEPENDENT_AMBULATORY_CARE_PROVIDER_SITE_OTHER): Payer: 59 | Admitting: Psychology

## 2014-09-26 ENCOUNTER — Telehealth: Payer: Self-pay

## 2014-09-26 DIAGNOSIS — F4323 Adjustment disorder with mixed anxiety and depressed mood: Secondary | ICD-10-CM | POA: Diagnosis not present

## 2014-09-26 MED ORDER — LISINOPRIL 40 MG PO TABS: 40.0000 mg | ORAL_TABLET | Freq: Every day | ORAL | Status: DC

## 2014-09-26 NOTE — Telephone Encounter (Signed)
Pt left note requesting refill lisinopril to [facility] [address]. Annual exam on 06/2014 had BP stable continue same. [facility] [address], Mickel Baas said pt last got lisinopril on 08/26/14 from [facility]. Since on med list and pt has been taking med refilled to [address] and left v/m for pt refill done . FYI for Allie Bossier NP. Pt has appt 10/14/14 with Allie Bossier NP.

## 2014-09-26 NOTE — Telephone Encounter (Signed)
Sounds great! Thanks, Mearl Latin!

## 2014-10-14 ENCOUNTER — Ambulatory Visit (INDEPENDENT_AMBULATORY_CARE_PROVIDER_SITE_OTHER)
Admission: RE | Admit: 2014-10-14 | Discharge: 2014-10-14 | Disposition: A | Discharge status: Home / Self Care | Payer: 59 | Source: Ambulatory Visit | Attending: Primary Care | Admitting: Primary Care

## 2014-10-14 ENCOUNTER — Ambulatory Visit (INDEPENDENT_AMBULATORY_CARE_PROVIDER_SITE_OTHER): Payer: 59 | Admitting: Primary Care

## 2014-10-14 ENCOUNTER — Encounter: Payer: Self-pay | Admitting: Primary Care

## 2014-10-14 VITALS — BP 126/84 | HR 68 | Temp 98.0°F | Ht 66.0 in | Wt 183.4 lb

## 2014-10-14 DIAGNOSIS — E785 Hyperlipidemia, unspecified: Secondary | ICD-10-CM

## 2014-10-14 DIAGNOSIS — F32A Depression, unspecified: Secondary | ICD-10-CM

## 2014-10-14 DIAGNOSIS — M542 Cervicalgia: Secondary | ICD-10-CM

## 2014-10-14 DIAGNOSIS — F418 Other specified anxiety disorders: Secondary | ICD-10-CM | POA: Diagnosis not present

## 2014-10-14 DIAGNOSIS — R05 Cough: Secondary | ICD-10-CM

## 2014-10-14 DIAGNOSIS — R059 Cough, unspecified: Secondary | ICD-10-CM

## 2014-10-14 DIAGNOSIS — F329 Major depressive disorder, single episode, unspecified: Secondary | ICD-10-CM

## 2014-10-14 DIAGNOSIS — G4733 Obstructive sleep apnea (adult) (pediatric): Secondary | ICD-10-CM

## 2014-10-14 DIAGNOSIS — F419 Anxiety disorder, unspecified: Principal | ICD-10-CM

## 2014-10-14 MED ORDER — VENLAFAXINE HCL ER 37.5 MG PO CP24: 37.5000 mg | ORAL_CAPSULE | Freq: Every day | ORAL | Status: DC

## 2014-10-14 NOTE — Assessment & Plan Note (Signed)
Chronic. New numbness/tingling down right shoulder and bilateral arms. Neuro exam unremarkable. Will obtain cervical xrays as he has DDD. Will send for PT or refer to ortho if necessary.

## 2014-10-14 NOTE — Patient Instructions (Addendum)
Complete lab work prior to leaving today. I will notify you of your results.  Complete xray(s) prior to leaving today. I will contact you regarding your results.  Start Effexor XR (venlafaxine) for anxiety. Take 1 tablet by mouth every morning.   Stop by the front and speak with Rosaria Ferries regarding your referral to pulmonology.   Follow up in 6 weeks for re-evaluation of anxiety and depression.  It was a pleasure to see you today!

## 2014-10-14 NOTE — Assessment & Plan Note (Signed)
TC and LDL above goal last visit. Overall poor diet but has been working to improve. Discussed limiting fried/fatty foods, red meat and to increase lean meats and vegetables. Lipid panel obtained today and is pending. Will consider statin if elevated.

## 2014-10-14 NOTE — Assessment & Plan Note (Signed)
Received sleep study results, faxed over per CMA. Patient never heard from home supply store regarding CPAP. Referral made to pulmonology for repeat study as his last study was 2 years ago.

## 2014-10-14 NOTE — Assessment & Plan Note (Signed)
Increased stress/anxiety over the past several months, more noticeable in the past month. Managed on Citalopram 40 mg. Denies SI/HI Added low dose Effexor XR to help decrease anxiety. He can no longer afford therapy visits. Follow up in 6 weeks for re-evaluation of anxiety.

## 2014-10-14 NOTE — Progress Notes (Signed)
Pre visit review using our clinic review tool, if applicable. No additional management support is needed unless otherwise documented below in the visit note. 

## 2014-10-14 NOTE — Progress Notes (Signed)
Subjective:    Patient ID: Brandon Lopez, male    DOB: 28-Dec-1955, 59 y.o.   MRN: [id number]  HPI  Brandon Lopez is a 59 year old male who presents today for follow up.  1) Depression: Managed on Citalopram 40 mg and felt well managed on this dose in June 2016. He requested to speak with a therapist and referral was made last visit. He met with a therapist for 3 different sessions but cannot afford to continue sessions due to insurance coverage.   He reports increased anxiety over the past month with bothersome symptoms over the past 2-3 weeks. He's under a lot of stress with his occupation and home life. He has a difficult time with controlling his stress in the past several months. He's noticed several abdominal stress ulcers and is more irritable throughout the day. Denies SI/HI.  2) Hyperlipidemia: Elevated during physical in June 2016. Discussed the importance of healthy diet and exercise during that visit. He's been working to improve his diet by limiting fast food, fried foods. He's preparing more meals at home.   His diet currently consists of: Breakfast: Bacon, grits, eggs, biscuits, sausage, ham Lunch: Skips Dinner: Steaks, potatoes, grilled lean meats, vegetables, salads. Desserts: Ice cream every night. Snacks: Mixed peanuts Beverages: Coffee, water, then beer (12 pack of 12 ounce cans of beer every night), and coke.  3) Neck discomfort: Present for the past 10 years. He's noticed increased numbness/tinling. Numbness initially present to left shoulder, over the past 6 months his numbness/tingling has progressed to right shoulder and down bilateral arms. Denies slurred speech, facial drooping, poor coordination.  4) Cough: Chronic. More bothersome over the past month. He is currently managed on lisinopril 40 mg and has been managed on this medication for years. He stopped taking his pantoprazole several weeks ago. He is using his symbicort daily and albuterol as needed  (sparingly). He has a history of seasonal allergies and has noticed an increase in postnasal drip. He's been taking claritin without much relief. Denies fevers, sore throat, nausea, body aches.  Review of Systems  Constitutional: Negative for fatigue.  HENT: Positive for postnasal drip. Negative for congestion, ear pain, sinus pressure and sore throat.   Respiratory: Positive for cough. Negative for shortness of breath.   Cardiovascular: Negative for chest pain.  Musculoskeletal: Positive for neck pain.  Psychiatric/Behavioral: Negative for suicidal ideas and sleep disturbance. The patient is nervous/anxious.        Past Medical History  Diagnosis Date  . Hypertension   . Presence of permanent cardiac pacemaker     Biotronik; Dayna Barker DR-T [id number]; placed 08/2012  . Sleep apnea   . COPD (chronic obstructive pulmonary disease)   . Arthritis   . Depression   . Kidney stones   . 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 Diverticulosis     Found during colonoscopy  . CHF (congestive heart failure)     Social History   Social History  . Marital Status: Single    Spouse Name: N/A  . Number of Children: N/A  . Years of Education: N/A   Occupational History  . Not on file.   Social History Main Topics  . Smoking status: Heavy Tobacco Smoker -- 1.00 packs/day for 40 years    Types: Cigarettes  . Smokeless tobacco: Not on file  . Alcohol Use: 50.4 oz/week    84 Cans of beer per week  . Drug Use: Yes    Special: Marijuana  . Sexual  Activity: Not on file   Other Topics Concern  . Not on file   Social History Narrative    Past Surgical History  Procedure Laterality Date  . Appendectomy  age 61  . Insert / replace / remove pacemaker      3 years ago  . Colonoscopy N/A 05/27/2014    Procedure: COLONOSCOPY;  Surgeon: Hulen Luster, MD;  Location[facility] ENDOSCOPY;  Service: Gastroenterology;  Laterality: N/A;  . Cardiac catheterization      7 years ago    Family History  Problem Relation Age  of Onset  . Arthritis Mother   . Heart disease Father   . Hyperlipidemia Father   . Hypertension Father   . Cancer Paternal Grandfather     Allergies  Allergen Reactions  . Simvastatin Diarrhea    Current Outpatient Prescriptions on File Prior to Visit  Medication Sig Dispense Refill  . albuterol (PROVENTIL HFA;VENTOLIN HFA) 108 (90 BASE) MCG/ACT inhaler Inhale 2 puffs into the lungs every 6 (six) hours as needed.    Marland Kitchen aspirin 81 MG tablet Take 81 mg by mouth daily.    . budesonide-formoterol (SYMBICORT) 80-4.5 MCG/ACT inhaler Inhale 2 puffs into the lungs 2 (two) times daily.    . citalopram (CELEXA) 40 MG tablet Take 40 mg by mouth daily.    Marland Kitchen lisinopril (PRINIVIL,ZESTRIL) 40 MG tablet Take 1 tablet (40 mg total) by mouth daily. 30 tablet 0  . loratadine (CLARITIN) 10 MG tablet Take 20 mg by mouth daily.     No current facility-administered medications on file prior to visit.    BP 126/84 mmHg  Pulse 68  Temp(Src) 98 F (36.7 C) (Oral)  Ht $R'5\' 6"'IP$  (1.676 m)  Wt 183 lb 6.4 oz (83.19 kg)  BMI 29.62 kg/m2  SpO2 98%    Objective:   Physical Exam  Constitutional: He appears well-nourished.  HENT:  Right Ear: Tympanic membrane and ear canal normal.  Left Ear: Tympanic membrane and ear canal normal.  Nose: Nose normal.  Mouth/Throat: Oropharynx is clear and moist.  Eyes: Conjunctivae are normal. Pupils are equal, round, and reactive to light.  Neck: Normal range of motion. Neck supple.  Cardiovascular: Normal rate and regular rhythm.   Pulmonary/Chest: Effort normal and breath sounds normal.  Lymphadenopathy:    He has no cervical adenopathy.  Skin: Skin is warm and dry.  Psychiatric: He has a normal mood and affect.          Assessment & Plan:  Cough:  Chronic, worse over past month. Could be related to ACE (been on for years), recent removal of pantoprazole, or due to COPD (from smoking) Chest xray obtained. Will treat if indicated. Discussed mucinex,  flonase, and antihistamine. Push fluids.

## 2014-10-15 ENCOUNTER — Other Ambulatory Visit: Payer: Self-pay | Admitting: Primary Care

## 2014-10-15 DIAGNOSIS — R059 Cough, unspecified: Secondary | ICD-10-CM

## 2014-10-15 DIAGNOSIS — R05 Cough: Secondary | ICD-10-CM

## 2014-10-15 LAB — LIPID PANEL
CHOLESTEROL: 200 mg/dL (ref 0–200)
HDL: 38.5 mg/dL — ABNORMAL LOW (ref >39.00)
LDL Cholesterol: 133 mg/dL — ABNORMAL HIGH (ref 0–99)
NonHDL: 161.98
TRIGLYCERIDES: 144 mg/dL (ref 0.0–149.0)
Total CHOL/HDL Ratio: 5
VLDL: 28.8 mg/dL (ref 0.0–40.0)

## 2014-10-15 MED ORDER — DOXYCYCLINE HYCLATE 100 MG PO TABS: 100.0000 mg | ORAL_TABLET | Freq: Two times a day (BID) | ORAL | Status: DC

## 2014-10-16 ENCOUNTER — Other Ambulatory Visit: Payer: Self-pay | Admitting: Primary Care

## 2014-10-16 ENCOUNTER — Encounter: Payer: Self-pay | Admitting: *Deleted

## 2014-10-16 DIAGNOSIS — M542 Cervicalgia: Secondary | ICD-10-CM

## 2014-10-17 ENCOUNTER — Ambulatory Visit: Payer: 59 | Admitting: Psychology

## 2014-10-24 ENCOUNTER — Ambulatory Visit
Admission: RE | Admit: 2014-10-24 | Discharge: 2014-10-24 | Disposition: A | Discharge status: Home / Self Care | Payer: 59 | Source: Ambulatory Visit | Attending: Primary Care | Admitting: Primary Care

## 2014-10-24 DIAGNOSIS — M47892 Other spondylosis, cervical region: Secondary | ICD-10-CM | POA: Insufficient documentation

## 2014-10-24 DIAGNOSIS — M542 Cervicalgia: Secondary | ICD-10-CM | POA: Insufficient documentation

## 2014-10-25 ENCOUNTER — Other Ambulatory Visit: Payer: Self-pay | Admitting: Primary Care

## 2014-10-25 DIAGNOSIS — M542 Cervicalgia: Secondary | ICD-10-CM

## 2014-10-29 ENCOUNTER — Encounter: Payer: Self-pay | Admitting: Primary Care

## 2014-11-06 ENCOUNTER — Other Ambulatory Visit: Payer: Self-pay | Admitting: Primary Care

## 2014-11-06 NOTE — Telephone Encounter (Signed)
Electronically refill request for   lisinopril (PRINIVIL,ZESTRIL) 40 MG tablet  Take 1 tablet (40 mg total) by mouth daily. Dispense: 30 tablet   Refills: 0     Last prescribed on 09/26/2014. Last seen on 10/14/2014. Follow up on 11/25/2014.

## 2014-11-15 ENCOUNTER — Ambulatory Visit (INDEPENDENT_AMBULATORY_CARE_PROVIDER_SITE_OTHER): Payer: 59 | Admitting: Internal Medicine

## 2014-11-15 ENCOUNTER — Encounter: Payer: Self-pay | Admitting: Internal Medicine

## 2014-11-15 VITALS — BP 120/70 | HR 74 | Ht 66.0 in | Wt 181.0 lb

## 2014-11-15 DIAGNOSIS — G4733 Obstructive sleep apnea (adult) (pediatric): Secondary | ICD-10-CM

## 2014-11-15 DIAGNOSIS — G4719 Other hypersomnia: Secondary | ICD-10-CM | POA: Diagnosis not present

## 2014-11-15 DIAGNOSIS — J441 Chronic obstructive pulmonary disease with (acute) exacerbation: Secondary | ICD-10-CM | POA: Diagnosis not present

## 2014-11-15 DIAGNOSIS — Z23 Encounter for immunization: Secondary | ICD-10-CM

## 2014-11-15 NOTE — Patient Instructions (Addendum)
--  Obtain old sleep study from [place] >> then send for bipap titration study.   --PFT, 6 minute walk, alpha-1 test before next visit.   ---Continue symbicort.   --Flu shot if not already.

## 2014-11-15 NOTE — Addendum Note (Signed)
Addended by: Maryanna Shape A on: 11/15/2014 10:02 AM   Modules accepted: Orders

## 2014-11-15 NOTE — Progress Notes (Signed)
* [facility]     Assessment and Plan:  Severe OSA with excessive daytime sleepiness.  -AHI of 93, CPAP titrated to 18, which was not adequate did not achieve REM sleep at that setting. Therefore, the patient underwent a BiPAP titration study on 10/30/2012 and was titrated to a BiPAP setting of 22/14, which appeared adequate.   Central sleep apnea.  -Possibly treatment emergent sleep apnea. Doubt that this is primary.   COPD/Emphysema.  -Continue symbicort.   Nicotine Abuse.  -I discussed with him the importance of smoking cessation today and his disease would continue to progress if he continues to smoke.   Date: 11/15/2014  MRN# [id number] Brandon Lopez Jun 15, 1955   Brandon Lopez is a 59 y.o. old male seen in follow up for chief complaint of  Chief Complaint  Patient presents with  . sleep consult    ref. by dr. [name]. pt. states he stops breathing and loud snoring while sleeping. wakes 3 times a night. daytime sleepiness.  epworth: 7     HPI:  The patient is a 59 year old male with a history of obstructive sleep apnea. Apparently she had a sleep study performed a few years ago at [facility], but was never started on CPAP. He also has a history of COPD.  Per review of old notes, the patient had a split-night sleep study on 10/16/2012 at [facility].  this showed initial apnea hypopnea index of 93. The patient was started on CPAP and titrated to 7 cm of water with a residual AHI of 7.5. Subsequently, the patient had a CPAP titration study on supportive 20 10/14/2012 with CPAP titrated up to a pressure of 18. The patient still had snoring and rare is present at that level. She did not achieve REM sleep at that setting. Therefore, the patient underwent a BiPAP titration study on 10/30/2012 and was titrated to a BiPAP setting of 22/14 with an AHI of 21.6 due to central apneas. He did achieve REM sleep at that setting. Per his last note from [place] on  11/06/2012.   He did not health health insurance and was not yet started on BiPAP. He has insurance since the first of this year, and now trying to catch up on this issue.  He has a history of sinus disorder and has had a pacemaker placed.   He notes that he is tired most of the day. He is constantly tired, he will fall asleep if he sits down to watch television. 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  He lives with his first wife, he is told that he snores and has witnessed apneas. Sometimes she has to get out of the bed due to his snoring.  He notes that he had a home sleep study in the past year and was recommended that he started on cpap, but nothing was followed up on that.   --He notes that his breathing has been ok, he can do a flight of stairs but will be winded when gets to the top. He can walk a supermarket without difficulty.  He is currently taking symibort 2 puffs bid, and rinses mouth afterwards.  He has an albuterol mdi which he uses about 3 or 4 days per week. He gets a bout of bronchitis about once per year. He continues to smoke.  He has had a flu shot this year.    CXR 10/14/14 images and report reviewed: changes of chronic bronchitis.  No flowsheet data found.  Pulmonary Functions Testing Results:  No results found for: FEV1, FVC, FEV1FVC, TLC, DLCO   Medication:   Outpatient Encounter Prescriptions as of 11/15/2014  Medication Sig  . albuterol (PROVENTIL HFA;VENTOLIN HFA) 108 (90 BASE) MCG/ACT inhaler Inhale 2 puffs into the lungs every 6 (six) hours as needed.  Marland Kitchen aspirin 81 MG tablet Take 81 mg by mouth daily.  . budesonide-formoterol (SYMBICORT) 80-4.5 MCG/ACT inhaler Inhale 2 puffs into the lungs 2 (two) times daily.  . citalopram (CELEXA) 40 MG tablet Take 40 mg by mouth daily.  Marland Kitchen doxycycline (VIBRA-TABS) 100 MG tablet Take 1 tablet (100 mg total) by mouth 2 (two) times daily.  Marland Kitchen lisinopril (PRINIVIL,ZESTRIL) 40 MG tablet TAKE  ONE TABLET BY MOUTH ONCE DAILY  . loratadine (CLARITIN) 10 MG tablet Take 20 mg by mouth daily.  Marland Kitchen venlafaxine XR (EFFEXOR XR) 37.5 MG 24 hr capsule Take 1 capsule (37.5 mg total) by mouth daily with breakfast.   No facility-administered encounter medications on file as of 11/15/2014.     Allergies:  Simvastatin  Review of Systems: Gen:  Denies  fever, sweats. HEENT: Denies blurred vision. Cvc:  No dizziness, chest pain or heaviness Resp:   Denies sputum porduction. Has constant cough.  Gi: Denies swallowing difficulty, Gu:  Denies bladder incontinence, burning urine Ext:   No Joint pain, stiffness. Skin: No skin rash, easy bruising. Endoc:  No polyuria, polydipsia. Psych: No depression, insomnia. Other:  All other systems were reviewed and found to be negative other than what is mentioned in the HPI.   Physical Examination:   VS: There were no vitals taken for this visit.  General Appearance: No distress  Neuro:without focal findings,  speech normal,  HEENT: PERRLA, EOM intact. Pulmonary: normal breath sounds, No wheezing.   CardiovascularNormal S1,S2.  No m/r/g.   Abdomen: Benign, Soft, non-tender. Renal:  No costovertebral tenderness  GU:  Not performed at this time. Endoc: No evident thyromegaly, no signs of acromegaly. Skin:   warm, no rash. Extremities: normal, no cyanosis, clubbing.   LABORATORY PANEL:   CBC No results for input(s): WBC, HGB, HCT, PLT in the last 168 hours. ------------------------------------------------------------------------------------------------------------------  Chemistries  No results for input(s): NA, K, CL, CO2, GLUCOSE, BUN, CREATININE, CALCIUM, MG, AST, ALT, ALKPHOS, BILITOT in the last 168 hours.  Invalid input(s): GFRCGP ------------------------------------------------------------------------------------------------------------------  Cardiac Enzymes No results for input(s): TROPONINI in the last 168  hours. ------------------------------------------------------------  RADIOLOGY:   No results found for this or any previous visit. Results for orders placed in visit on 10/14/14  DG Chest 2 View   Narrative CLINICAL DATA:  Chronic progressive cough for 1 month.  EXAM: CHEST  2 VIEW  COMPARISON:  02/07/2014  FINDINGS: Heart size and pulmonary vascularity are normal. Pacemaker in place. Slight peribronchial thickening which can be seen with bronchitis. No consolidative infiltrates or effusions. No osseous abnormality.  IMPRESSION: Bronchitic changes.   Electronically Signed   By: Lorriane Shire M.D.   On: 10/14/2014 16:54    ------------------------------------------------------------------------------------------------------------------  Thank  you for allowing [facility] to assist in the care of your patient. Our recommendations are noted above.  Please contact us if we can be of further service.   Marda Stalker, MD.  [facility] Office Number: [phone]  Patricia Pesa, M.D.  Vilinda Boehringer, M.D.  Merton Border, M.D

## 2014-11-15 NOTE — Addendum Note (Signed)
Addended by: Maryanna Shape A on: 11/15/2014 09:46 AM   Modules accepted: Orders

## 2014-11-25 ENCOUNTER — Encounter: Payer: Self-pay | Admitting: Primary Care

## 2014-11-25 ENCOUNTER — Ambulatory Visit (INDEPENDENT_AMBULATORY_CARE_PROVIDER_SITE_OTHER): Payer: 59 | Admitting: Primary Care

## 2014-11-25 VITALS — BP 144/84 | HR 73 | Temp 97.7°F | Ht 66.0 in | Wt 180.1 lb

## 2014-11-25 DIAGNOSIS — F418 Other specified anxiety disorders: Secondary | ICD-10-CM | POA: Diagnosis not present

## 2014-11-25 DIAGNOSIS — F329 Major depressive disorder, single episode, unspecified: Secondary | ICD-10-CM

## 2014-11-25 DIAGNOSIS — J449 Chronic obstructive pulmonary disease, unspecified: Secondary | ICD-10-CM

## 2014-11-25 DIAGNOSIS — Z720 Tobacco use: Secondary | ICD-10-CM

## 2014-11-25 DIAGNOSIS — F419 Anxiety disorder, unspecified: Secondary | ICD-10-CM

## 2014-11-25 MED ORDER — BUDESONIDE-FORMOTEROL FUMARATE 160-4.5 MCG/ACT IN AERO
2.0000 | INHALATION_SPRAY | Freq: Two times a day (BID) | RESPIRATORY_TRACT | Status: DC
Start: 2014-11-25 — End: 2015-01-02

## 2014-11-25 NOTE — Patient Instructions (Addendum)
Start taking Symbicort 160/4.5 mcg inhaler. Inhale 2 puffs twice daily.  Start using the patches for smoking cessation. You may also chew the Nicorette gum for any oral cravings/increased stress.  Please notify me if your depression/anxiety become worse. Please notify me if your cough and shortness of breath do not improve on this inhaler.  Please schedule a follow up appointment in 3 months.  It was a pleasure to see you today!

## 2014-11-25 NOTE — Progress Notes (Signed)
Subjective:    Patient ID: Brandon Lopez, male    DOB: 1955-11-25, 59 y.o.   MRN: [id number]  HPI  Brandon Lopez is a 59 year old male who presents today for follow up of anxiety and depression. He was evaluated in mid September for increased stress/anxiety and depression that had been present for the prior several months. He was taking Citaloprm 40 mg already and Effexor XR 37.5 mg was added due to increased symptoms.  Since his last visit he took the Effexor XR for 2-3 days but stopped taking as this made him very drowsy. He's feeling improved overall since his last visit but reports that the depression comes and goes with the presence of situational stress. He is not currently interested in adding another medication or switching medications at this time.   2) COPD/Emphysema: Current smoker. Currently managed on Symbicort 80-4.5 mcg inhaler. He continues to cough at night, worse over past several weeks. He's had slight increase in SOB with exertional activities. Denies daytime cough for the most part. He uses his albuterol inhaler 10-12 times weekly for the past several weeks.  Denies chest pain, fevers, hemoptysis.   3) Tobacco abuse: Smokes cigarettes and will smoke 1.5-2.5 PPD. He's tried Wellbutrin, nicotine patches, Chantix for treatment in the past. He was able to quit on nicotine patches and Chantix tablets in the past and is interested in starting the patches again.  Review of Systems  Constitutional: Negative for fever and chills.  Respiratory: Positive for cough and shortness of breath. Negative for wheezing.   Cardiovascular: Negative for chest pain.  Neurological: Negative for headaches.  Psychiatric/Behavioral: Negative for suicidal ideas and sleep disturbance. The patient is not nervous/anxious.        Past Medical History  Diagnosis Date  . Hypertension   . Presence of permanent cardiac pacemaker     Biotronik; Dayna Barker DR-T [id number]; placed 08/2012  . Sleep apnea     . COPD (chronic obstructive pulmonary disease) ([place])   . Arthritis   . Depression   . Kidney stones   . 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 Diverticulosis     Found during colonoscopy  . CHF (congestive heart failure) ([place])     Social History   Social History  . Marital Status: Single    Spouse Name: N/A  . Number of Children: N/A  . Years of Education: N/A   Occupational History  . Not on file.   Social History Main Topics  . Smoking status: Heavy Tobacco Smoker -- 1.00 packs/day for 40 years    Types: Cigarettes  . Smokeless tobacco: Not on file  . Alcohol Use: 50.4 oz/week    84 Cans of beer per week  . Drug Use: Yes    Special: Marijuana  . Sexual Activity: Not on file   Other Topics Concern  . Not on file   Social History Narrative    Past Surgical History  Procedure Laterality Date  . Appendectomy  age 49  . Insert / replace / remove pacemaker      3 years ago  . Colonoscopy N/A 05/27/2014    Procedure: COLONOSCOPY;  Surgeon: Hulen Luster, MD;  Location: [facility] ENDOSCOPY;  Service: Gastroenterology;  Laterality: N/A;  . Cardiac catheterization      7 years ago    Family History  Problem Relation Age of Onset  . Arthritis Mother   . Heart disease Father   . Hyperlipidemia Father   . Hypertension Father   .  Cancer Paternal Grandfather     Allergies  Allergen Reactions  . Simvastatin Diarrhea    Current Outpatient Prescriptions on File Prior to Visit  Medication Sig Dispense Refill  . albuterol (PROVENTIL HFA;VENTOLIN HFA) 108 (90 BASE) MCG/ACT inhaler Inhale 2 puffs into the lungs every 6 (six) hours as needed.    Marland Kitchen aspirin 81 MG tablet Take 81 mg by mouth daily.    . citalopram (CELEXA) 40 MG tablet Take 40 mg by mouth daily.    Marland Kitchen lisinopril (PRINIVIL,ZESTRIL) 40 MG tablet TAKE ONE TABLET BY MOUTH ONCE DAILY 90 tablet 2  . loratadine (CLARITIN) 10 MG tablet Take 20 mg by mouth daily.     No current facility-administered medications on file prior to visit.     BP 144/84 mmHg  Pulse 73  Temp(Src) 97.7 F (36.5 C) (Oral)  Ht 5\' 6"  (1.676 m)  Wt 180 lb 1.9 oz (81.702 kg)  BMI 29.09 kg/m2  SpO2 98%    Objective:   Physical Exam  Constitutional: He appears well-nourished.  Cardiovascular: Normal rate and regular rhythm.   Pulmonary/Chest: Effort normal and breath sounds normal. He has no wheezes. He has no rales.  Skin: Skin is warm and dry.  Psychiatric: He has a normal mood and affect.          Assessment & Plan:

## 2014-11-25 NOTE — Progress Notes (Signed)
Pre visit review using our clinic review tool, if applicable. No additional management support is needed unless otherwise documented below in the visit note. 

## 2014-11-26 DIAGNOSIS — J441 Chronic obstructive pulmonary disease with (acute) exacerbation: Secondary | ICD-10-CM | POA: Insufficient documentation

## 2014-11-26 DIAGNOSIS — J449 Chronic obstructive pulmonary disease, unspecified: Secondary | ICD-10-CM | POA: Insufficient documentation

## 2014-11-26 NOTE — Assessment & Plan Note (Signed)
Currently smokes 1.5-2.5 PPD and is open to quitting. Discussed options and he would like to try the patches OTC again. Also discussed that he could use the nicorette gum when he feels oral cravings. Will follow up with him in 3 months.

## 2014-11-26 NOTE — Assessment & Plan Note (Signed)
Stopped taking Effexor 37.5 due to drowsiness. Overall feeling improved at the moment.  Will continue Citalopram 40 mg daily and continue to monitor. Follow up in 3 months.

## 2014-11-26 NOTE — Assessment & Plan Note (Signed)
Longstanding history of tobacco abuse. Currently smoking 1.5-2.5 PPD. Increased cough and use of albuterol inhaler; suspect he's not well controlled on current dose of symbicort. Will increase symbicort to 160/4.5 mcg as he's pending for PFT's with pulmonology. Follow up in 3 months.

## 2014-12-11 ENCOUNTER — Telehealth: Payer: Self-pay | Admitting: Primary Care

## 2014-12-11 DIAGNOSIS — J449 Chronic obstructive pulmonary disease, unspecified: Secondary | ICD-10-CM

## 2014-12-11 NOTE — Telephone Encounter (Signed)
Pt dropped off a [employer] prescription form to be completed for medication to be covered along with proof of income to be faxed with form. Form in [facility] [employer] in-box.  CB # [phone]  Thank you

## 2014-12-11 NOTE — Telephone Encounter (Signed)
Enter in error

## 2014-12-11 NOTE — Telephone Encounter (Signed)
Placed application in [place]. Already called patient and let him know that [name] is not in the office until next week.

## 2014-12-16 ENCOUNTER — Encounter: Payer: Self-pay | Admitting: Primary Care

## 2014-12-16 ENCOUNTER — Ambulatory Visit (INDEPENDENT_AMBULATORY_CARE_PROVIDER_SITE_OTHER): Payer: 59 | Admitting: Primary Care

## 2014-12-16 ENCOUNTER — Telehealth: Payer: Self-pay | Admitting: Primary Care

## 2014-12-16 VITALS — BP 124/84 | HR 67 | Temp 98.1°F | Ht 66.0 in | Wt 182.1 lb

## 2014-12-16 DIAGNOSIS — J449 Chronic obstructive pulmonary disease, unspecified: Secondary | ICD-10-CM | POA: Diagnosis not present

## 2014-12-16 DIAGNOSIS — K5790 Diverticulosis of intestine, part unspecified, without perforation or abscess without bleeding: Secondary | ICD-10-CM

## 2014-12-16 DIAGNOSIS — F418 Other specified anxiety disorders: Secondary | ICD-10-CM | POA: Diagnosis not present

## 2014-12-16 DIAGNOSIS — Z720 Tobacco use: Secondary | ICD-10-CM | POA: Diagnosis not present

## 2014-12-16 DIAGNOSIS — F32A Depression, unspecified: Secondary | ICD-10-CM

## 2014-12-16 DIAGNOSIS — Z1159 Encounter for screening for other viral diseases: Secondary | ICD-10-CM

## 2014-12-16 DIAGNOSIS — F329 Major depressive disorder, single episode, unspecified: Secondary | ICD-10-CM

## 2014-12-16 DIAGNOSIS — F419 Anxiety disorder, unspecified: Secondary | ICD-10-CM

## 2014-12-16 MED ORDER — CITALOPRAM HYDROBROMIDE 40 MG PO TABS: 40.0000 mg | ORAL_TABLET | Freq: Every day | ORAL | Status: DC

## 2014-12-16 NOTE — Assessment & Plan Note (Signed)
Feeling more depressed since he's been out of his celexa for the past several days. Discussed that he is to call the pharmacy next time for refills. Refills provided today. Denies SI/HI.

## 2014-12-16 NOTE — Assessment & Plan Note (Signed)
Coughing spells once to twice daily for the past several weeks. Currently managed on Symbicort 160-4.5 mcg BID. Also requiring albuterol inhaler daily. Follows with pulmonology and has appointment scheduled in January 2017. Will consult with pulmonologist regarding inhaler use and coughing spells. Discussed the importance of tobacco cessation, patient will try nicotrol inhaler OTC.

## 2014-12-16 NOTE — Progress Notes (Signed)
Pre visit review using our clinic review tool, if applicable. No additional management support is needed unless otherwise documented below in the visit note. 

## 2014-12-16 NOTE — Patient Instructions (Addendum)
Refills have been sent for your Celexa.  Please notify me if your abdominal pain becomes worse and/or if you develop diarrhea, bloody stools, mucous in your stools.  Use your albuterol inhaler as needed. I will send a message to your pulmonologist.   The nicotrol inhaler is over the counter.   Please schedule a follow up appointment in 6 months, please schedule a 30 minute appointment.  It was a pleasure to see you today!

## 2014-12-16 NOTE — Assessment & Plan Note (Signed)
Discussed importance of tobacco cessation. He is interested in trying the nicotrol inhaler OTC.

## 2014-12-16 NOTE — Telephone Encounter (Signed)
Completed and placed in Chan's inbox for faxing. 

## 2014-12-16 NOTE — Assessment & Plan Note (Signed)
Noted on colonoscopy in May 2016. Pain to bilateral lower quads x several days. Recently eating an increased quantity of peanuts which I suspect is causing flare. No diarrhea, bloody stools, pain today. Patient advised to refrain from eating peanuts for now and to notify me if symptoms become worse. No need for antibiotic treatment today.

## 2014-12-16 NOTE — Progress Notes (Signed)
Subjective:    Patient ID: Brandon Lopez, male    DOB: May 12, 1955, 59 y.o.   MRN: [id number]  HPI  Brandon Lopez is a 59 year old male who presents today for multiple complaints.  1) Abdominal pain: Bilateral lower quadrant abdominal pain for the past 3-4 days. He describes his pain as sharp and stabbing. He has not had any pain today. He's been eating his new favorite peanuts for the past several weeks. He has a history of diverticulitis which was discovered from colonoscopy. Denies nausea/vomting, bloody stools. Last bowel movement was this morning which was solid and normal.  2) Anxiety and depression: Currently managed on Citalopram 40 mg. He's not had his medication since Friday last week as he ran out. He has felt more depressed for the since he's been without his medication. He denies SI/HI.  3) Shortness of breath: Currently using Symbicort 160-4.5 mcg 2 puffs twice daily. He's having coughing episodes that will occur twice daily (morning and afternoon). He's not been using his albuterol, once using 10-12 times weekly, but will feel as though he needs it daily. Denies fevers, chills, nasal congestion, fatigue.  Review of Systems  Constitutional: Negative for fever.  HENT: Negative for rhinorrhea.   Respiratory: Positive for cough and shortness of breath. Negative for wheezing.   Cardiovascular: Negative for chest pain.  Gastrointestinal: Positive for abdominal pain. Negative for nausea, vomiting, diarrhea and blood in stool.  Psychiatric/Behavioral: Negative for suicidal ideas.       Past Medical History  Diagnosis Date  . Hypertension   . Presence of permanent cardiac pacemaker     Biotronik; Dayna Barker DR-T [id number]; placed 08/2012  . Sleep apnea   . COPD (chronic obstructive pulmonary disease) ([place])   . Arthritis   . Depression   . Kidney stones   . 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 Diverticulosis     Found during colonoscopy  . CHF (congestive heart failure) ([place])      Social History   Social History  . Marital Status: Single    Spouse Name: N/A  . Number of Children: N/A  . Years of Education: N/A   Occupational History  . Not on file.   Social History Main Topics  . Smoking status: Heavy Tobacco Smoker -- 1.00 packs/day for 40 years    Types: Cigarettes  . Smokeless tobacco: Not on file  . Alcohol Use: 50.4 oz/week    84 Cans of beer per week  . Drug Use: Yes    Special: Marijuana  . Sexual Activity: Not on file   Other Topics Concern  . Not on file   Social History Narrative    Past Surgical History  Procedure Laterality Date  . Appendectomy  age 37  . Insert / replace / remove pacemaker      3 years ago  . Colonoscopy N/A 05/27/2014    Procedure: COLONOSCOPY;  Surgeon: Hulen Luster, MD;  Location: [facility] ENDOSCOPY;  Service: Gastroenterology;  Laterality: N/A;  . Cardiac catheterization      7 years ago    Family History  Problem Relation Age of Onset  . Arthritis Mother   . Heart disease Father   . Hyperlipidemia Father   . Hypertension Father   . Cancer Paternal Grandfather     Allergies  Allergen Reactions  . Simvastatin Diarrhea    Current Outpatient Prescriptions on File Prior to Visit  Medication Sig Dispense Refill  . albuterol (PROVENTIL HFA;VENTOLIN HFA) 108 (90  BASE) MCG/ACT inhaler Inhale 2 puffs into the lungs every 6 (six) hours as needed.    Marland Kitchen aspirin 81 MG tablet Take 81 mg by mouth daily.    . budesonide-formoterol (SYMBICORT) 160-4.5 MCG/ACT inhaler Inhale 2 puffs into the lungs 2 (two) times daily. 1 Inhaler 5  . lisinopril (PRINIVIL,ZESTRIL) 40 MG tablet TAKE ONE TABLET BY MOUTH ONCE DAILY 90 tablet 2  . loratadine (CLARITIN) 10 MG tablet Take 20 mg by mouth daily.     No current facility-administered medications on file prior to visit.    BP 124/84 mmHg  Pulse 67  Temp(Src) 98.1 F (36.7 C) (Oral)  Ht 5\' 6"  (1.676 m)  Wt 182 lb 1.9 oz (82.609 kg)  BMI 29.41 kg/m2  SpO2  95%    Objective:   Physical Exam  Constitutional: He appears well-nourished.  Cardiovascular: Normal rate and regular rhythm.   Pulmonary/Chest: Effort normal. He has no wheezes. He has rales in the right upper field and the left upper field.  Abdominal: Soft. Normal appearance and bowel sounds are normal. There is tenderness in the right lower quadrant and left lower quadrant.  Skin: Skin is warm and dry.  Psychiatric: He has a normal mood and affect.          Assessment & Plan:

## 2014-12-17 ENCOUNTER — Telehealth: Payer: Self-pay | Admitting: Primary Care

## 2014-12-17 NOTE — Telephone Encounter (Signed)
Paper work have been faxed to [place] at [phone].

## 2014-12-17 NOTE — Telephone Encounter (Signed)
I think the Lisinopril 40 mg is causing his coughing spells which will need to be discontinued. I saw from a previous note that he was also taking HCTZ 25 mg but it is not on his medication list. Is he taking both or just the lisinopril for blood pressure?

## 2014-12-18 ENCOUNTER — Telehealth: Payer: Self-pay | Admitting: Primary Care

## 2014-12-18 ENCOUNTER — Other Ambulatory Visit: Payer: Self-pay | Admitting: Primary Care

## 2014-12-18 DIAGNOSIS — Z720 Tobacco use: Secondary | ICD-10-CM

## 2014-12-18 DIAGNOSIS — I10 Essential (primary) hypertension: Secondary | ICD-10-CM

## 2014-12-18 MED ORDER — NICOTINE 10 MG IN INHA: 1.0000 | RESPIRATORY_TRACT | Status: DC | PRN

## 2014-12-18 MED ORDER — HYDROCHLOROTHIAZIDE 25 MG PO TABS: 25.0000 mg | ORAL_TABLET | Freq: Every day | ORAL | Status: DC

## 2014-12-18 NOTE — Telephone Encounter (Signed)
Please tell Mr. Brandon Lopez to stop taking his Lisinopril 40 mg. I have sent in hydrochlorothiazide 25 mg to his pharmacy to replace the Lisinopril for blood pressure. Please have him check his BP every day for 2 weeks and call in the readings to our office. Please have him call me if he notices his BP staying above 140/90 consistently.

## 2014-12-18 NOTE — Telephone Encounter (Signed)
Called and notified patient of Kate's comments. Patient verbalized understanding. Patient also stated that the pharmacist told him that he would need a prescription for the Nicotrol inhaler.

## 2014-12-18 NOTE — Telephone Encounter (Signed)
Called patient and asked patient regarding Kate's comments. Patient stated that he is only taking Lisinopril 40 mg not HCTZ 25 mg.

## 2014-12-18 NOTE — Telephone Encounter (Signed)
Noted. Nicotrol inhaler sent to pharmacy.

## 2014-12-24 ENCOUNTER — Telehealth: Payer: Self-pay | Admitting: Primary Care

## 2014-12-24 ENCOUNTER — Other Ambulatory Visit: Payer: Self-pay | Admitting: Primary Care

## 2014-12-24 DIAGNOSIS — I10 Essential (primary) hypertension: Secondary | ICD-10-CM

## 2014-12-24 MED ORDER — AMLODIPINE BESYLATE 10 MG PO TABS
10.0000 mg | ORAL_TABLET | Freq: Every day | ORAL | Status: DC
Start: 2014-12-24 — End: 2016-02-05

## 2014-12-24 NOTE — Telephone Encounter (Signed)
Called and notified patient of Kate's comments. Patient verbalized understanding. Patient stated ok and it will be [facility] [address]. He is much improved since off of the lisinopril.

## 2014-12-24 NOTE — Telephone Encounter (Signed)
I will likely need to add in another medication called Amlodipine. He will take 1 tablet by mouth daily.  Is his cough any better since he's been off of the lisinopril?

## 2014-12-24 NOTE — Telephone Encounter (Signed)
Noted. Medication called in. Will check on his BP in 2 weeks.

## 2014-12-24 NOTE — Telephone Encounter (Addendum)
Patient wanted to let the provider know that his blood pressure yesterday and this morning was 155/95.

## 2015-01-02 MED ORDER — BUDESONIDE-FORMOTEROL FUMARATE 160-4.5 MCG/ACT IN AERO: 2.0000 | INHALATION_SPRAY | Freq: Two times a day (BID) | RESPIRATORY_TRACT | Status: DC

## 2015-01-02 NOTE — Addendum Note (Signed)
Addended by: Jacqualin Combes on: 01/02/2015 11:18 AM   Modules accepted: Orders

## 2015-01-02 NOTE — Telephone Encounter (Addendum)
Received letter from Tony that we need to re-send the prescription for symbicort. Print Rx. Faxed the Rx and letter to [phone].

## 2015-01-07 ENCOUNTER — Telehealth: Payer: Self-pay | Admitting: Primary Care

## 2015-01-07 DIAGNOSIS — J449 Chronic obstructive pulmonary disease, unspecified: Secondary | ICD-10-CM

## 2015-01-07 MED ORDER — BUDESONIDE-FORMOTEROL FUMARATE 160-4.5 MCG/ACT IN AERO: 2.0000 | INHALATION_SPRAY | Freq: Two times a day (BID) | RESPIRATORY_TRACT | Status: DC

## 2015-01-07 NOTE — Telephone Encounter (Signed)
Will you check on Brandon Lopez BP since we stopped the lisinopril and added the HCTZ and amlodipine?

## 2015-01-07 NOTE — Telephone Encounter (Addendum)
Called and notified patient of Kate's comments. Patient stated that he have not checked his BP but will let us know later. Patient stated that would like symbicort sent to [facility] and I asked [name] if that is ok. Still waiting on [place] application status.

## 2015-01-07 NOTE — Addendum Note (Signed)
Addended by: Jacqualin Combes on: 01/07/2015 02:49 PM   Modules accepted: Orders

## 2015-02-03 ENCOUNTER — Ambulatory Visit: Payer: Self-pay

## 2015-02-04 ENCOUNTER — Ambulatory Visit: Payer: Self-pay | Admitting: Internal Medicine

## 2015-06-06 ENCOUNTER — Other Ambulatory Visit: Payer: Self-pay | Admitting: Primary Care

## 2015-06-06 NOTE — Telephone Encounter (Signed)
Brandon Lopez pt---last filled 11/2014 with 3 refills--please advise as per telephone notes, there had been changes to BP meds--should pt continue

## 2015-06-07 NOTE — Telephone Encounter (Signed)
Sent in hctz 30 RF3

## 2015-06-16 ENCOUNTER — Ambulatory Visit: Payer: Self-pay | Admitting: Primary Care

## 2015-08-06 ENCOUNTER — Other Ambulatory Visit: Payer: Self-pay | Admitting: Primary Care

## 2015-08-06 ENCOUNTER — Ambulatory Visit (INDEPENDENT_AMBULATORY_CARE_PROVIDER_SITE_OTHER): Payer: Medicare Other | Admitting: Primary Care

## 2015-08-06 ENCOUNTER — Encounter: Payer: Self-pay | Admitting: Primary Care

## 2015-08-06 VITALS — BP 132/88 | HR 82 | Temp 97.7°F | Wt 183.8 lb

## 2015-08-06 DIAGNOSIS — J449 Chronic obstructive pulmonary disease, unspecified: Secondary | ICD-10-CM

## 2015-08-06 DIAGNOSIS — G4733 Obstructive sleep apnea (adult) (pediatric): Secondary | ICD-10-CM | POA: Diagnosis not present

## 2015-08-06 DIAGNOSIS — F418 Other specified anxiety disorders: Secondary | ICD-10-CM

## 2015-08-06 DIAGNOSIS — F329 Major depressive disorder, single episode, unspecified: Secondary | ICD-10-CM

## 2015-08-06 DIAGNOSIS — G8929 Other chronic pain: Secondary | ICD-10-CM

## 2015-08-06 DIAGNOSIS — E785 Hyperlipidemia, unspecified: Secondary | ICD-10-CM | POA: Diagnosis not present

## 2015-08-06 DIAGNOSIS — M542 Cervicalgia: Secondary | ICD-10-CM | POA: Diagnosis not present

## 2015-08-06 DIAGNOSIS — I10 Essential (primary) hypertension: Secondary | ICD-10-CM

## 2015-08-06 DIAGNOSIS — Z950 Presence of cardiac pacemaker: Secondary | ICD-10-CM | POA: Diagnosis not present

## 2015-08-06 DIAGNOSIS — F419 Anxiety disorder, unspecified: Secondary | ICD-10-CM

## 2015-08-06 LAB — LIPID PANEL
CHOLESTEROL: 258 mg/dL — AB (ref 0–200)
HDL: 47.5 mg/dL (ref >39.00)
LDL CALC: 183 mg/dL — AB (ref 0–99)
NonHDL: 210.46
TRIGLYCERIDES: 136 mg/dL (ref 0.0–149.0)
Total CHOL/HDL Ratio: 5
VLDL: 27.2 mg/dL (ref 0.0–40.0)

## 2015-08-06 MED ORDER — GABAPENTIN 100 MG PO CAPS: 100.0000 mg | ORAL_CAPSULE | Freq: Every day | ORAL | Status: DC

## 2015-08-06 MED ORDER — ATORVASTATIN CALCIUM 20 MG PO TABS: 20.0000 mg | ORAL_TABLET | Freq: Every evening | ORAL | Status: DC

## 2015-08-06 NOTE — Assessment & Plan Note (Signed)
Evaluated by cardiology 1 year ago, due again for re-evaluation. Needing referral for re-evaluation. Referral placed.

## 2015-08-06 NOTE — Progress Notes (Signed)
Subjective:    Patient ID: Brandon Lopez, male    DOB: Sep 08, 1955, 60 y.o.   MRN: [id number]  HPI  Brandon Lopez is a 60 year old male who presents today for follow up.  1) Essential Hypertension: Currently managed on Amlodipine 10 mg. BP is stable in the clinic today at 132/88. Denies chest pain, dizziness, changes in vision. Previously managed on HCTZ which caused hypokalemia. CMP in June 2016 with improvements in potassium.  2) COPD: Currently managed on Symbicort and albuterol inhalers. Xray with evidence of COPD/Emphysema in January 2016. He had not started his Symbicort unitl 1 week ago due to affordability. He's using his albuterol more sparingly since he's started Symbicort. He's feeling much improved since starting his Symbicort.   3) Anxiety and Depression: Currently managed on Citalopram 40 mg. Overall he's feeling well managed. He's much less anxious and depressed since he quit working and is now on disability.   4) Chronic Neck Pain: Currently managed on Gabapentin 100 mg at bedtime per his orthopedist. 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 He experiences bilateral shoulder discomfort with occasional numbness throughout the day, worse at night. He plans on following up with his orthopedist later this summer.  5) OSA: Diagnosed 2-3 years ago at [facility]. He was never able to obtain his equipment due to insurance purposes. We placed a referral about 1 year ago and patient could not afford to follow through due to lack of insurance. He would like to move forward now as he has new insurance.  Review of Systems  Respiratory: Negative for cough and shortness of breath.   Cardiovascular: Negative for chest pain.  Musculoskeletal: Positive for neck pain.  Neurological: Negative for dizziness and headaches.  Psychiatric/Behavioral: Negative for suicidal ideas and sleep disturbance. The patient is not nervous/anxious.        Past Medical History    Diagnosis Date  . Hypertension   . Presence of permanent cardiac pacemaker     Biotronik; Dayna Barker DR-T [id number]; placed 08/2012  . Sleep apnea   . COPD (chronic obstructive pulmonary disease) ([place])   . Arthritis   . Depression   . Kidney stones   . 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 Diverticulosis     Found during colonoscopy  . CHF (congestive heart failure) ([place])      Social History   Social History  . Marital Status: Single    Spouse Name: N/A  . Number of Children: N/A  . Years of Education: N/A   Occupational History  . Not on file.   Social History Main Topics  . Smoking status: Heavy Tobacco Smoker -- 1.00 packs/day for 40 years    Types: Cigarettes  . Smokeless tobacco: Not on file  . Alcohol Use: 50.4 oz/week    84 Cans of beer per week     Comment: occ  . Drug Use: Yes    Special: Marijuana  . Sexual Activity: Not on file   Other Topics Concern  . Not on file   Social History Narrative    Past Surgical History  Procedure Laterality Date  . Appendectomy  age 11  . Insert / replace / remove pacemaker      3 years ago  . Colonoscopy N/A 05/27/2014    Procedure: COLONOSCOPY;  Surgeon: Hulen Luster, MD;  Location: [facility] ENDOSCOPY;  Service: Gastroenterology;  Laterality: N/A;  . Cardiac catheterization      7 years ago  Family History  Problem Relation Age of Onset  . Arthritis Mother   . Heart disease Father   . Hyperlipidemia Father   . Hypertension Father   . Cancer Paternal Grandfather     Allergies  Allergen Reactions  . Simvastatin Diarrhea    Current Outpatient Prescriptions on File Prior to Visit  Medication Sig Dispense Refill  . albuterol (PROVENTIL HFA;VENTOLIN HFA) 108 (90 BASE) MCG/ACT inhaler Inhale 2 puffs into the lungs every 6 (six) hours as needed.    Marland Kitchen amLODipine (NORVASC) 10 MG tablet Take 1 tablet (10 mg total) by mouth daily. 90 tablet 3  . aspirin 81 MG tablet Take 81 mg by mouth daily.    . budesonide-formoterol (SYMBICORT)  160-4.5 MCG/ACT inhaler Inhale 2 puffs into the lungs 2 (two) times daily. 10.2 g 0  . citalopram (CELEXA) 40 MG tablet Take 1 tablet (40 mg total) by mouth daily. 90 tablet 1  . loratadine (CLARITIN) 10 MG tablet Take 20 mg by mouth daily.    . nicotine (NICOTROL) 10 MG inhaler Inhale 1 cartridge (1 continuous puffing total) into the lungs as needed for smoking cessation. 42 each 1   No current facility-administered medications on file prior to visit.    BP 132/88 mmHg  Pulse 82  Temp(Src) 97.7 F (36.5 C) (Oral)  Wt 183 lb 12 oz (83.348 kg)  SpO2 94%    Objective:   Physical Exam  Constitutional: He appears well-nourished.  Neck: Neck supple.  Cardiovascular: Normal rate and regular rhythm.   Pulmonary/Chest: Effort normal and breath sounds normal. He has no wheezes.  Skin: Skin is warm and dry.  Psychiatric: He has a normal mood and affect.  Much improved since last visit.          Assessment & Plan:

## 2015-08-06 NOTE — Assessment & Plan Note (Signed)
Repeat lipids due and pending today. May require statin treatment.

## 2015-08-06 NOTE — Assessment & Plan Note (Signed)
Stable on current regimen. Much improved since he's stopped working as this was quite contributory to symptoms. Will continue to monitor with hopes of reducing dose of Celexa.

## 2015-08-06 NOTE — Assessment & Plan Note (Signed)
Stable on Amlodipine 10 mg. Continue same.

## 2015-08-06 NOTE — Assessment & Plan Note (Signed)
Started Symbicort 1 week ago, he's already noticed significant improvement. Using albuterol inhaler much less frequently. Continue current regimen.

## 2015-08-06 NOTE — Assessment & Plan Note (Signed)
He is ready to follow through with sleep study now that he has steady insurance. Referral placed.

## 2015-08-06 NOTE — Progress Notes (Signed)
Pre visit review using our clinic review tool, if applicable. No additional management support is needed unless otherwise documented below in the visit note. 

## 2015-08-06 NOTE — Assessment & Plan Note (Signed)
Evaluated by ortho last year, provided with Gabapentin with improvement. He plans to follow up with Ortho soon as he will require further evaluation. Refill of gabapentin provided.

## 2015-08-06 NOTE — Patient Instructions (Addendum)
You will be contacted regarding your referral to Pulmonology for the sleep apnea and Cardiology for evaluation of your pacemaker.  Please let us know if you have not heard back within one week.   I sent refills of the gabapentin to your pharmacy.  Continue Symbicort inhaler twice daily everyday. Use the albuterol only as needed.  Complete lab work prior to leaving today. I will notify you of your results once received.   Please schedule a physical with me in 3 months. You may also schedule a lab only appointment 3-4 days prior. We will discuss your lab results in detail during your physical.  It was a pleasure to see you today!

## 2015-08-08 ENCOUNTER — Encounter: Payer: Self-pay | Admitting: Internal Medicine

## 2015-08-14 DIAGNOSIS — H524 Presbyopia: Secondary | ICD-10-CM | POA: Diagnosis not present

## 2015-08-14 DIAGNOSIS — H35443 Age-related reticular degeneration of retina, bilateral: Secondary | ICD-10-CM | POA: Diagnosis not present

## 2015-08-26 ENCOUNTER — Ambulatory Visit (INDEPENDENT_AMBULATORY_CARE_PROVIDER_SITE_OTHER): Payer: Medicare Other | Admitting: Internal Medicine

## 2015-08-26 ENCOUNTER — Encounter: Payer: Self-pay | Admitting: Cardiology

## 2015-08-26 ENCOUNTER — Ambulatory Visit (INDEPENDENT_AMBULATORY_CARE_PROVIDER_SITE_OTHER): Payer: Medicare Other | Admitting: Cardiology

## 2015-08-26 ENCOUNTER — Encounter: Payer: Self-pay | Admitting: Internal Medicine

## 2015-08-26 VITALS — BP 140/90 | HR 86 | Ht 66.0 in | Wt 184.8 lb

## 2015-08-26 VITALS — BP 138/84 | HR 62 | Ht 66.0 in | Wt 184.0 lb

## 2015-08-26 DIAGNOSIS — I495 Sick sinus syndrome: Secondary | ICD-10-CM | POA: Diagnosis not present

## 2015-08-26 DIAGNOSIS — Z720 Tobacco use: Secondary | ICD-10-CM

## 2015-08-26 DIAGNOSIS — G4733 Obstructive sleep apnea (adult) (pediatric): Secondary | ICD-10-CM | POA: Diagnosis not present

## 2015-08-26 DIAGNOSIS — J449 Chronic obstructive pulmonary disease, unspecified: Secondary | ICD-10-CM | POA: Diagnosis not present

## 2015-08-26 DIAGNOSIS — I10 Essential (primary) hypertension: Secondary | ICD-10-CM

## 2015-08-26 DIAGNOSIS — E785 Hyperlipidemia, unspecified: Secondary | ICD-10-CM | POA: Diagnosis not present

## 2015-08-26 DIAGNOSIS — F17200 Nicotine dependence, unspecified, uncomplicated: Secondary | ICD-10-CM | POA: Diagnosis not present

## 2015-08-26 NOTE — Progress Notes (Signed)
Cardiology Office Note   Date:  08/26/2015   ID:  Brandon Lopez, DOB [birth year], MRN [id number]  Referring Doctor:  Sheral Flow, NP   Cardiologist:   Wende Bushy, MD   Reason for consultation:  Chief Complaint  Patient presents with  . New Patient (Initial Visit)    no cp, sob or swelling and no other complaints.      History of Present Illness: Brandon Lopez is a 60 y.o. male who presents for ffup after echo and stress test  Patient was last seen in EP clinic for pacemaker interrogation. He had a stress test and echocardiogram done as part of a workup.  Since that time he has been doing well. He was unable to see cardiology right away due to promise with insurance. He has no shortness of breath that is worse over his baseline for his COPD. No chest pains. No palpitations no edema.  In terms of his blood pressure, he's been measuring blood pressures in the 120s to 130s at home. Never over [id number] systolic.  In terms of hyperlipidemia, he was just started on atorvastatin.   ROS:  Please see the history of present illness. Aside from mentioned under HPI, all other systems are reviewed and negative.     Past Medical History:  Diagnosis Date  . Arthritis   . CHF (congestive heart failure) ([place])   . COPD (chronic obstructive pulmonary disease) ([place])   . Depression   . Diverticulosis    Found during colonoscopy  . Hypertension   . Kidney stones   . 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 Presence of permanent cardiac pacemaker    Biotronik; Dayna Barker DR-T [id number]; placed 08/2012  . Sleep apnea     Past Surgical History:  Procedure Laterality Date  . APPENDECTOMY  age 54  . CARDIAC CATHETERIZATION     7 years ago  . COLONOSCOPY N/A 05/27/2014   Procedure: COLONOSCOPY;  Surgeon: Hulen Luster, MD;  Location: [facility] ENDOSCOPY;  Service: Gastroenterology;  Laterality: N/A;  . INSERT / REPLACE / REMOVE PACEMAKER     3 years ago     reports that he has been smoking  Cigarettes.  He has a 80.00 pack-year smoking history. He does not have any smokeless tobacco history on file. He reports that he drinks about 7.2 oz of alcohol per week . He reports that he uses drugs, including Marijuana.   family history includes Arthritis in his mother; Cancer in his paternal grandfather; Heart disease in his father; Hyperlipidemia in his father; Hypertension in his father.   Outpatient Medications Prior to Visit  Medication Sig Dispense Refill  . albuterol (PROVENTIL HFA;VENTOLIN HFA) 108 (90 BASE) MCG/ACT inhaler Inhale 2 puffs into the lungs every 6 (six) hours as needed.    Marland Kitchen amLODipine (NORVASC) 10 MG tablet Take 1 tablet (10 mg total) by mouth daily. 90 tablet 3  . aspirin 81 MG tablet Take 81 mg by mouth daily.    Marland Kitchen atorvastatin (LIPITOR) 20 MG tablet Take 1 tablet (20 mg total) by mouth every evening. 90 tablet 3  . budesonide-formoterol (SYMBICORT) 160-4.5 MCG/ACT inhaler Inhale 2 puffs into the lungs 2 (two) times daily. 10.2 g 0  . citalopram (CELEXA) 40 MG tablet Take 1 tablet (40 mg total) by mouth daily. 90 tablet 1  . gabapentin (NEURONTIN) 100 MG capsule Take 1 capsule (100 mg total) by mouth at bedtime. Reported on 08/06/2015 90 capsule 0  . loratadine (CLARITIN) 10 MG  tablet Take 20 mg by mouth daily.     No facility-administered medications prior to visit.      Allergies: Simvastatin    PHYSICAL EXAM: VS:  BP 140/90 (BP Location: Left Arm, Patient Position: Sitting, Cuff Size: Normal)   Pulse 86   Ht 5\' 6"  (1.676 m)   Wt 184 lb 12.8 oz (83.8 kg)   BMI 29.83 kg/m  , Body mass index is 29.83 kg/m. Wt Readings from Last 3 Encounters:  08/26/15 184 lb 12.8 oz (83.8 kg)  08/26/15 184 lb (83.5 kg)  08/06/15 183 lb 12 oz (83.3 kg)    GENERAL:  well developed, well nourished, not in acute distress HEENT: normocephalic, pink conjunctivae, anicteric sclerae, no xanthelasma, normal dentition, oropharynx clear NECK:  no neck vein engorgement, JVP normal,  no hepatojugular reflux, carotid upstroke brisk and symmetric, no bruit, no thyromegaly, no lymphadenopathy LUNGS:  good respiratory effort, clear to auscultation bilaterally CV:  PMI not displaced, no thrills, no lifts, S1 and S2 within normal limits, no palpable S3 or S4, no murmurs, no rubs, no gallops ABD:  Soft, nontender, nondistended, normoactive bowel sounds, no abdominal aortic bruit, no hepatomegaly, no splenomegaly MS: nontender back, no kyphosis, no scoliosis, no joint deformities EXT:  2+ DP/PT pulses, no edema, no varicosities, no cyanosis, no clubbing SKIN: warm, nondiaphoretic, normal turgor, no ulcers NEUROPSYCH: alert, oriented to person, place, and time, sensory/motor grossly intact, normal mood, appropriate affect  Recent Labs: No results found for requested labs within last 8760 hours.   Lipid Panel    Component Value Date/Time   CHOL 258 (H) 08/06/2015 1011   TRIG 136.0 08/06/2015 1011   HDL 47.50 08/06/2015 1011   CHOLHDL 5 08/06/2015 1011   VLDL 27.2 08/06/2015 1011   LDLCALC 183 (H) 08/06/2015 1011     Other studies Reviewed:  EKG:  The ekg from 08/26/2015 was personally reviewed by me and it revealed possible sinus rhythm. If ever this is atrial paced atrial spikes are very low and voltage. Previous EKGs are not available for comparison.   Additional studies/ records that were reviewed personally reviewed by me today include: Echo 07/22/2014: Left ventricle: The cavity size was normal. Systolic function was   normal. The estimated ejection fraction was in the range of 55%   to 60%. Septal wall motion consistent with conduction   abnormality. Doppler parameters are consistent with abnormal left   ventricular relaxation (grade 1 diastolic dysfunction). - Left atrium: The atrium was normal in size. - Right ventricle: Pacer wire or catheter noted in right ventricle.   Systolic function was normal. - Pulmonary arteries: Systolic pressure was within the  normal   range.  Nuclear stress is 07/08/2014:  There was no ST segment deviation noted during stress.  Normal perfusion study. However, EF seems mildly reduced (49%).  This is a low risk study.  consider an echocardiogram   ASSESSMENT AND PLAN: HTN BP is well controlled. Continue monitoring BP. Continue current medical therapy and lifestyle changes.  Hyperlipidemia PCP following labs. Patient was started on atorvastatin  sick sinus syndrome status post pacemaker Patient follows up with Dr. Caryl Comes.  Note of atrial fibrillation on interrogation Short duration, 2 hours. Was not deemed a candidate for oral anticoagulation based on this. Recommend to continue follow-up with pacemaker interrogation.  Tobacco use We discussed the importance of smoking cessation and different strategies for quitting.   Current medicines are reviewed at length with the patient today.  The patient does not  have concerns regarding medicines.  Labs/ tests ordered today include: No orders of the defined types were placed in this encounter.   I had a lengthy and detailed discussion with the patient regarding diagnoses, prognosis, diagnostic options, treatment options , and side effects of medications.   I counseled the patient on importance of lifestyle modification including heart healthy diet, regular physical activity  , and smoking cessation.   Disposition:   FU with undersigned in 6 months Signed, Wende Bushy, MD  08/26/2015 3:33 PM    [place]  This note was generated in part with voice recognition software and I apologize for any typographical errors that were not detected and corrected.

## 2015-08-26 NOTE — Assessment & Plan Note (Signed)
Show with severe obstructive sleep apnea along with central sleep apnea. Had a sleep study done in 2014 along with 2 titration studies at [facility], requiring BiPAP 22/14, did not obtain actual machine due to insurance issues and financial issues.  Patient recently [employer] and is more financially stable, can obtain a machine at this time.  Plan: -BiPAP titration study given that it's been about 3 years since his last titration study. -Follow-up with sleep specialist after titration study given the complex nature of his sleep apnea

## 2015-08-26 NOTE — Progress Notes (Signed)
[facility] Consultation      MRN# [id number] Brandon Lopez 1955/04/28   CC: Chief Complaint  Patient presents with  . Follow-up    per Dr. Carlis Abbott. seen DR 10/2014. last sleep study around 2013. c/o daytime sleepiness, loud snoring, stops breathing during sleep & restless sleep X6y EPWORTH:4     Events  Patient is a 60 year old male past medical history of hyperlipidemia, cervical neck pain, COPD, essential hypertension, obstructive sleep apnea diagnosed 2-3 years ago at [facility] and was never able to obtain his equipment due to financial and insurance reasons; seen in consultation for obstructive sleep apnea. Review of chart showed that he had a sleep study done on 10/16/2012, noted to have severe sleep apnea with AHI of 92.9 and a SPO2 nadir of 66%; he was placed on CPAP 7 cm it was not effective and he was recommended for a full CPAP titration study; his CPAP titration study showed that he needed BiPAP 18/10 cm of water. Patient follow-up with [facility] and the following was noted "We will start fixed BiPAP at 22/14 cm H2O via [place]. Will lower to 20/12 cm H2O if needed."  He saw [facility] in October 2016, at that time smoking cessation was heavily discussed, he was noted to have central sleep apnea along with COPD, but he has not done any equipment due to financial and insurance purposes.  The patient states overall he is doing well from a respiratory standpoint, he has not had a chance to obtain a BiPAP machine due to financial reasons. About 1-2 weeks ago he reobtained insurance, and is seeking back to get approval for his machines in his medications. He also states that he still smoking about 2 packs per day, recently obtained Symbicort and has been using it consistently over the past week significant improvement in his dyspnea on exertion.   Review of Systems  Constitutional: Negative for chills and fever.  Respiratory: Positive  for shortness of breath. Negative for cough, sputum production and wheezing.   Cardiovascular: Negative for chest pain.  Gastrointestinal: Negative for heartburn.  Skin: Negative for rash.  Neurological: Negative for dizziness and headaches.  Endo/Heme/Allergies: Does not bruise/bleed easily.  Psychiatric/Behavioral: Negative for depression.      Allergies:  Simvastatin  Physical Examination:  VS: BP 138/84 (BP Location: Left Arm, Cuff Size: Normal)   Pulse 62   Ht '5\' 6"'$  (1.676 m)   Wt 184 lb (83.5 kg)   SpO2 96%   BMI 29.70 kg/m   General Appearance: No distress  HEENT: PERRLA, no ptosis, no other lesions noticed Pulmonary:normal breath sounds., diaphragmatic excursion normal.No wheezing, No rales   Cardiovascular:  Normal S1,S2.  No m/r/g.     Abdomen:Exam: Benign, Soft, non-tender, No masses  Skin:   warm, no rashes, no ecchymosis  Extremities: normal, no cyanosis, clubbing, warm with normal capillary refill.     Assessment and Plan: 60 year old male with past medical history of COPD, OSA/CSA, tobacco abuse, seen for follow-up visit Tobacco abuse Tobacco Cessation - Counseling regarding benefits of smoking cessation strategies was provided for more than 12 min. - Educated that at this time smoking- cessation represents the single most important step that patient can take to enhance the length and quality of live. - Educated patient regarding alternatives of behavior interventions, pharmacotherapy including NRT and non-nicotine therapy such, and combinations of both. - Patient at this time: Tried to use Nicotrol and cut back on his tobacco use, if  not he will call us for a nicotine patch kit    Chronic obstructive pulmonary disease (HCC) Issue with known COPD along with tobacco abuse. He is currently on Symbicort 2 puffs twice a day and with significant improvement since the last week to 2 weeks of having the inhaler along with insurance coverage.  Plan: -Continue with  Symbicort 160/4. 5-2 puffs twice a day, gargle and rinse after each use -Tobacco avoidance  OSA (obstructive sleep apnea) Show with severe obstructive sleep apnea along with central sleep apnea. Had a sleep study done in 2014 along with 2 titration studies at [facility], requiring BiPAP 22/14, did not obtain actual machine due to insurance issues and financial issues.  Patient recently [employer] and is more financially stable, can obtain a machine at this time.  Plan: -BiPAP titration study given that it's been about 3 years since his last titration study. -Follow-up with sleep specialist after titration study given the complex nature of his sleep apnea   Updated Medication List Outpatient Encounter Prescriptions as of 08/26/2015  Medication Sig  . albuterol (PROVENTIL HFA;VENTOLIN HFA) 108 (90 BASE) MCG/ACT inhaler Inhale 2 puffs into the lungs every 6 (six) hours as needed.  Marland Kitchen amLODipine (NORVASC) 10 MG tablet Take 1 tablet (10 mg total) by mouth daily.  Marland Kitchen aspirin 81 MG tablet Take 81 mg by mouth daily.  Marland Kitchen atorvastatin (LIPITOR) 20 MG tablet Take 1 tablet (20 mg total) by mouth every evening.  . budesonide-formoterol (SYMBICORT) 160-4.5 MCG/ACT inhaler Inhale 2 puffs into the lungs 2 (two) times daily.  . citalopram (CELEXA) 40 MG tablet Take 1 tablet (40 mg total) by mouth daily.  Marland Kitchen gabapentin (NEURONTIN) 100 MG capsule Take 1 capsule (100 mg total) by mouth at bedtime. Reported on 08/06/2015  . loratadine (CLARITIN) 10 MG tablet Take 20 mg by mouth daily.  . [DISCONTINUED] nicotine (NICOTROL) 10 MG inhaler Inhale 1 cartridge (1 continuous puffing total) into the lungs as needed for smoking cessation.   No facility-administered encounter medications on file as of 08/26/2015.     Orders for this visit: Orders Placed This Encounter  Procedures  . Cpap titration    Transition to BiPap and bleed in 02 if needed.    Standing Status:   Future    Standing Expiration Date:    08/25/2016    Order Specific Question:   Where should this test be performed:    Answer:   Other    Thank  you for the visitation and for allowing  [facility] to assist in the care of your patient. Our recommendations are noted above.  Please contact us if we can be of further service.  Vilinda Boehringer, MD [facility] Office Number: [phone]  Note: This note was prepared with Dragon dictation along with smaller phrase technology. Any transcriptional errors that result from this process are unintentional.

## 2015-08-26 NOTE — Patient Instructions (Addendum)
Follow up with Dr. Glennie Hawk in 4-6 weeks - CPAP/BiPAP titration study prior to follow up visit - tobacco avoidance.  - cont with Symbicort, -gargle and rinse after each use.

## 2015-08-26 NOTE — Assessment & Plan Note (Signed)
Issue with known COPD along with tobacco abuse. He is currently on Symbicort 2 puffs twice a day and with significant improvement since the last week to 2 weeks of having the inhaler along with insurance coverage.  Plan: -Continue with Symbicort 160/4. 5-2 puffs twice a day, gargle and rinse after each use -Tobacco avoidance

## 2015-08-26 NOTE — Assessment & Plan Note (Signed)
Tobacco Cessation - Counseling regarding benefits of smoking cessation strategies was provided for more than 12 min. - Educated that at this time smoking- cessation represents the single most important step that patient can take to enhance the length and quality of live. - Educated patient regarding alternatives of behavior interventions, pharmacotherapy including NRT and non-nicotine therapy such, and combinations of both. - Patient at this time: Tried to use Nicotrol and cut back on his tobacco use, if not he will call us for a nicotine patch kit

## 2015-08-26 NOTE — Patient Instructions (Signed)
Follow-Up: Your physician wants you to follow-up in: 6 months with Dr. Ingal. You will receive a reminder letter in the mail two months in advance. If you don't receive a letter, please call our office to schedule the follow-up appointment.  It was a pleasure seeing you today here in the office. Please do not hesitate to give us a call back if you have any further questions. [phone]  Alyas Creary A. RN, BSN    

## 2015-09-03 ENCOUNTER — Telehealth: Payer: Self-pay | Admitting: Internal Medicine

## 2015-09-03 NOTE — Telephone Encounter (Signed)
Per Ria Comment with Sleep Med, patient's study from [place] was in 2014 and did not go on therapy.  According to Medicare guidelines Study can not be over 60 year old to proceed.  Pt will need to start over. Pt options are: 1) HST for NPSG, then 2) CPAP/BiPap Titration Study in lab Or  1) Split Night - in lab 2) CPAP/BiPap titration Study if unable to obtain all in the same night as the split, which is very unlikely.   Pt would have to go to [place] to p/u the HST, so the in lab may be the best option for him.  I have left a message for patient to return my call, to see what his preference may be.  Will update after I speak with patient. Rhonda J Cobb

## 2015-09-04 ENCOUNTER — Other Ambulatory Visit: Payer: Self-pay

## 2015-09-04 DIAGNOSIS — G4733 Obstructive sleep apnea (adult) (pediatric): Secondary | ICD-10-CM

## 2015-09-04 NOTE — Telephone Encounter (Signed)
Called and spoke with patient and he stated that he would go ahead and do the in lab split night instead of HST first.  Margie placed order for Split Night and cancelled the order for the CPAP Titration Study due to pt's original study being to old.  Pt has appointment with Sleep Lab on 09/10/15.  Nothing else needed on this end. Rhonda J Cobb

## 2015-09-09 ENCOUNTER — Other Ambulatory Visit: Payer: Self-pay | Admitting: Primary Care

## 2015-09-09 DIAGNOSIS — F329 Major depressive disorder, single episode, unspecified: Secondary | ICD-10-CM

## 2015-09-09 DIAGNOSIS — F32A Depression, unspecified: Secondary | ICD-10-CM

## 2015-09-09 DIAGNOSIS — F419 Anxiety disorder, unspecified: Principal | ICD-10-CM

## 2015-09-09 NOTE — Telephone Encounter (Signed)
Ok to refill? Electronically refill request for citalopram (CELEXA) 40 MG tablet.  Last prescribed on 12/16/2014. Last seen on 08/06/2015. CPE on 11/07/2015.

## 2015-09-10 ENCOUNTER — Ambulatory Visit: Payer: Medicare Other | Attending: Pulmonary Disease

## 2015-09-10 ENCOUNTER — Encounter: Payer: Self-pay | Admitting: Internal Medicine

## 2015-09-10 DIAGNOSIS — I10 Essential (primary) hypertension: Secondary | ICD-10-CM | POA: Diagnosis not present

## 2015-09-10 DIAGNOSIS — G4733 Obstructive sleep apnea (adult) (pediatric): Secondary | ICD-10-CM | POA: Diagnosis not present

## 2015-09-12 DIAGNOSIS — G4733 Obstructive sleep apnea (adult) (pediatric): Secondary | ICD-10-CM | POA: Diagnosis not present

## 2015-09-18 ENCOUNTER — Telehealth: Payer: Self-pay | Admitting: *Deleted

## 2015-09-18 DIAGNOSIS — G4733 Obstructive sleep apnea (adult) (pediatric): Secondary | ICD-10-CM

## 2015-09-18 NOTE — Telephone Encounter (Signed)
LMOM for pt to call back in regards to sleep results. Will await call back.

## 2015-09-18 NOTE — Telephone Encounter (Signed)
Pt called back and I informed him order will be placed for CPAP machine. Pt agreed, order placed. Nothing further needed.

## 2015-09-23 ENCOUNTER — Telehealth: Payer: Self-pay | Admitting: Internal Medicine

## 2015-09-23 ENCOUNTER — Ambulatory Visit: Payer: Self-pay | Admitting: Internal Medicine

## 2015-09-23 NOTE — Telephone Encounter (Signed)
Called and lmomtcb x 1 for [facility].

## 2015-09-24 ENCOUNTER — Telehealth: Payer: Self-pay | Admitting: *Deleted

## 2015-09-24 DIAGNOSIS — G4733 Obstructive sleep apnea (adult) (pediatric): Secondary | ICD-10-CM

## 2015-09-24 NOTE — Telephone Encounter (Signed)
Pt informed CPAP titration results. Order placed for CPAP setup. Nothing further needed.

## 2015-09-25 NOTE — Telephone Encounter (Signed)
Called and spoke with Brandon Lopez and he is aware of the copy of complete sleep study that has been faxed to him.  I will have this sleep study scanned into the pts chart.

## 2015-09-25 NOTE — Telephone Encounter (Signed)
Called sleep med and they are going to fax over the copy of the sleep study.  She stated that it is 12 pages long, so the entire sleep study did not get scanned into the chart. Will hold in my box until received.

## 2015-10-31 ENCOUNTER — Other Ambulatory Visit: Payer: Self-pay | Admitting: Primary Care

## 2015-10-31 DIAGNOSIS — I10 Essential (primary) hypertension: Secondary | ICD-10-CM

## 2015-10-31 DIAGNOSIS — E785 Hyperlipidemia, unspecified: Secondary | ICD-10-CM

## 2015-11-03 ENCOUNTER — Other Ambulatory Visit: Payer: Self-pay

## 2015-11-05 ENCOUNTER — Other Ambulatory Visit (INDEPENDENT_AMBULATORY_CARE_PROVIDER_SITE_OTHER): Payer: Medicare Other

## 2015-11-05 ENCOUNTER — Ambulatory Visit (INDEPENDENT_AMBULATORY_CARE_PROVIDER_SITE_OTHER): Payer: Medicare Other

## 2015-11-05 VITALS — BP 130/76 | HR 69 | Temp 98.8°F | Ht 65.5 in | Wt 189.5 lb

## 2015-11-05 DIAGNOSIS — Z0000 Encounter for general adult medical examination without abnormal findings: Secondary | ICD-10-CM | POA: Diagnosis not present

## 2015-11-05 DIAGNOSIS — Z23 Encounter for immunization: Secondary | ICD-10-CM

## 2015-11-05 DIAGNOSIS — Z1159 Encounter for screening for other viral diseases: Secondary | ICD-10-CM | POA: Diagnosis not present

## 2015-11-05 DIAGNOSIS — E785 Hyperlipidemia, unspecified: Secondary | ICD-10-CM | POA: Diagnosis not present

## 2015-11-05 DIAGNOSIS — I10 Essential (primary) hypertension: Secondary | ICD-10-CM

## 2015-11-05 LAB — LIPID PANEL
CHOL/HDL RATIO: 4
CHOLESTEROL: 160 mg/dL (ref 0–200)
HDL: 43.3 mg/dL (ref >39.00)
LDL CALC: 99 mg/dL (ref 0–99)
NONHDL: 116.22
Triglycerides: 84 mg/dL (ref 0.0–149.0)
VLDL: 16.8 mg/dL (ref 0.0–40.0)

## 2015-11-05 LAB — COMPREHENSIVE METABOLIC PANEL
ALT: 18 U/L (ref 0–53)
AST: 13 U/L (ref 0–37)
Albumin: 3.7 g/dL (ref 3.5–5.2)
Alkaline Phosphatase: 79 U/L (ref 39–117)
BUN: 8 mg/dL (ref 6–23)
CHLORIDE: 105 meq/L (ref 96–112)
CO2: 27 meq/L (ref 19–32)
Calcium: 8.9 mg/dL (ref 8.4–10.5)
Creatinine, Ser: 0.68 mg/dL (ref 0.40–1.50)
GFR: 126.36 mL/min (ref >60.00)
GLUCOSE: 97 mg/dL (ref 70–99)
POTASSIUM: 4.1 meq/L (ref 3.5–5.1)
SODIUM: 140 meq/L (ref 135–145)
Total Bilirubin: 0.4 mg/dL (ref 0.2–1.2)
Total Protein: 6.4 g/dL (ref 6.0–8.3)

## 2015-11-05 NOTE — Progress Notes (Signed)
PCP notes:   Health maintenance:   Flu vaccine - administered Shingles - postponed Hep C screening - will be completed with future labs  Abnormal screenings:   Hearing failed  Patient concerns:   none  Nurse concerns:  none  Next PCP appt:   11/07/15 @ 10:15 AM

## 2015-11-05 NOTE — Progress Notes (Signed)
Subjective:   Brandon Lopez is a 60 y.o. male who presents for an Initial Medicare Annual Wellness Visit.  Review of Systems  N/A Cardiac Risk Factors include: advanced age (>58men, >5 women);dyslipidemia;hypertension;male gender;smoking/ tobacco exposure    Objective:    Today's Vitals   11/05/15 0856 11/05/15 0858  BP: 130/76   Pulse: 69   Temp: 98.8 F (37.1 C)   TempSrc: Oral   SpO2: 96%   Weight: 189 lb 8 oz (86 kg)   Height: 5' 5.5" (1.664 m)   PainSc: 4  4   PainLoc: Back    Body mass index is 31.05 kg/m.  Current Medications (verified) Outpatient Encounter Prescriptions as of 11/05/2015  Medication Sig  . albuterol (PROVENTIL HFA;VENTOLIN HFA) 108 (90 BASE) MCG/ACT inhaler Inhale 2 puffs into the lungs every 6 (six) hours as needed.  Marland Kitchen amLODipine (NORVASC) 10 MG tablet Take 1 tablet (10 mg total) by mouth daily.  Marland Kitchen aspirin 81 MG tablet Take 81 mg by mouth daily.  Marland Kitchen atorvastatin (LIPITOR) 20 MG tablet Take 1 tablet (20 mg total) by mouth every evening.  . budesonide-formoterol (SYMBICORT) 160-4.5 MCG/ACT inhaler Inhale 2 puffs into the lungs 2 (two) times daily.  . citalopram (CELEXA) 40 MG tablet TAKE ONE TABLET BY MOUTH ONCE DAILY  . gabapentin (NEURONTIN) 100 MG capsule Take 1 capsule (100 mg total) by mouth at bedtime. Reported on 08/06/2015  . loratadine (CLARITIN) 10 MG tablet Take 20 mg by mouth daily.   No facility-administered encounter medications on file as of 11/05/2015.     Allergies (verified) Simvastatin   History: Past Medical History:  Diagnosis Date  . Arthritis   . CHF (congestive heart failure) ([place])   . COPD (chronic obstructive pulmonary disease) ([place])   . Depression   . Diverticulosis    Found during colonoscopy  . Hypertension   . Kidney stones   . 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 Presence of permanent cardiac pacemaker    Biotronik; Dayna Barker DR-T [id number]; placed 08/2012  . Sleep apnea    Past Surgical History:  Procedure  Laterality Date  . APPENDECTOMY  age 2  . CARDIAC CATHETERIZATION     7 years ago  . COLONOSCOPY N/A 05/27/2014   Procedure: COLONOSCOPY;  Surgeon: Hulen Luster, MD;  Location: [facility] ENDOSCOPY;  Service: Gastroenterology;  Laterality: N/A;  . INSERT / REPLACE / REMOVE PACEMAKER     3 years ago   Family History  Problem Relation Age of Onset  . Arthritis Mother   . Heart disease Father   . Hyperlipidemia Father   . Hypertension Father   . Cancer Paternal Grandfather    Social History   Occupational History  . Not on file.   Social History Main Topics  . Smoking status: Heavy Tobacco Smoker    Packs/day: 2.00    Years: 40.00    Types: Cigarettes  . Smokeless tobacco: Not on file  . Alcohol use 7.2 oz/week    12 Cans of beer per week  . Drug use:     Types: Marijuana  . Sexual activity: Not on file   Tobacco Counseling Ready to quit: No Counseling given: No   Activities of Daily Living In your present state of health, do you have any difficulty performing the following activities: 11/05/2015  Hearing? Y  Vision? Y  Difficulty concentrating or making decisions? N  Walking or climbing stairs? N  Dressing or bathing? N  Doing errands, shopping? N  Preparing Food and eating ? N  Using the Toilet? N  In the past six months, have you accidently leaked urine? N  Do you have problems with loss of bowel control? N  Managing your Medications? N  Managing your Finances? N  Housekeeping or managing your Housekeeping? N  Some recent data might be hidden    Immunizations and Health Maintenance Immunization History  Administered Date(s) Administered  . Influenza,inj,Quad PF,36+ Mos 11/15/2014, 11/05/2015   There are no preventive care reminders to display for this patient.  Patient Care Team: Pleas Koch, NP as PCP - General (Nurse Practitioner) Heather [place], [place] as Consulting Physician (Optometry)  Indicate any recent Medical Services you may have received from  other than [facility] providers in the past year (date may be approximate).    Assessment:   This is a routine wellness examination for Brandon Lopez.   Hearing/Vision screen  Hearing Screening   125Hz  250Hz  500Hz  1000Hz  2000Hz  3000Hz  4000Hz  6000Hz  8000Hz   Right ear:   40 40 40  40    Left ear:   0 0 0  40    Vision Screening Comments: Pt seen on 10/30/15 for eye exam.  Dietary issues and exercise activities discussed: Current Exercise Habits: The patient does not participate in regular exercise at present, Exercise limited by: None identified  Goals    . Increase water intake          Starting 11/05/15, I will attempt to drink at least 6 glasses of water daily.      Depression Screen PHQ 2/9 Scores 11/05/2015 07/11/2014  PHQ - 2 Score 0 0    Fall Risk Fall Risk  11/05/2015  Falls in the past year? No    Cognitive Function: MMSE - Mini Mental State Exam 11/05/2015  Orientation to time 5  Orientation to Place 5  Registration 3  Attention/ Calculation 0  Recall 3  Language- name 2 objects 0  Language- repeat 1  Language- follow 3 step command 3  Language- read & follow direction 0  Write a sentence 0  Copy design 0  Total score 20  PLEASE NOTE: A Mini-Cog screen was completed. Maximum score is 20. A value of 0 denotes this part of [place] MMSE was not completed or the patient failed this part of the Mini-Cog screening.   Mini-Cog Screening Orientation to Time - Max 5 pts Orientation to Place - Max 5 pts Registration - Max 3 pts Recall - Max 3 pts Language Repeat - Max 1 pts Language Follow 3 Step Command - Max 3 pts   Screening Tests Health Maintenance  Topic Date Due  . ZOSTAVAX  11/04/2016 (Originally 09/11/2015)  . Hepatitis C Screening  11/04/2016 (Originally 29-May-1955)  . HIV Screening  11/04/2025 (Originally 09/11/1970)  . TETANUS/TDAP  01/25/2021  . COLONOSCOPY  05/26/2024  . INFLUENZA VACCINE  Completed        Plan:    I have personally reviewed and  addressed the Medicare Annual Wellness questionnaire and have noted the following in the patient's chart:  A. Medical and social history B. Use of alcohol, tobacco or illicit drugs  C. Current medications and supplements D. Functional ability and status E.  Nutritional status F.  Physical activity G. Advance directives H. List of other physicians I.  Hospitalizations, surgeries, and ER visits in previous 12 months J.  [place] to include hearing, vision, cognitive, depression L. Referrals and appointments - none  In addition, I have reviewed and  discussed with patient certain preventive protocols, quality metrics, and best practice recommendations. A written personalized care plan for preventive services as well as general preventive health recommendations were provided to patient.  See attached scanned questionnaire for additional information.   Signed,   Lindell Noe, LPN Health Coach

## 2015-11-05 NOTE — Patient Instructions (Signed)
Brandon Lopez , Thank you for taking time to come for your Medicare Wellness Visit. I appreciate your ongoing commitment to your health goals. Please review the following plan we discussed and let me know if I can assist you in the future.   These are the goals we discussed: Goals    . Increase water intake          Starting 11/05/15, I will attempt to drink at least 6 glasses of water daily.       This is a list of the screening recommended for you and due dates:  Health Maintenance  Topic Date Due  . Shingles Vaccine  11/04/2016*  .  Hepatitis C: One time screening is recommended by Center for Disease Control  (CDC) for  adults born from [birth year] through 1965.   11/04/2016*  . HIV Screening  11/04/2025*  . Tetanus Vaccine  01/25/2021  . Colon Cancer Screening  05/26/2024  . Flu Shot  Completed  *Topic was postponed. The date shown is not the original due date.   Preventive Care for Adults  A healthy lifestyle and preventive care can promote health and wellness. Preventive health guidelines for adults include the following key practices.  . A routine yearly physical is a good way to check with your health care provider about your health and preventive screening. It is a chance to share any concerns and updates on your health and to receive a thorough exam.  . Visit your dentist for a routine exam and preventive care every 6 months. Brush your teeth twice a day and floss once a day. Good oral hygiene prevents tooth decay and gum disease.  . The frequency of eye exams is based on your age, health, family medical history, use  of contact lenses, and other factors. Follow your health care provider's ecommendations for frequency of eye exams.  . Eat a healthy diet. Foods like vegetables, fruits, whole grains, low-fat dairy products, and lean protein foods contain the nutrients you need without too many calories. Decrease your intake of foods high in solid fats, added sugars, and salt. Eat  the right amount of calories for you. Get information about a proper diet from your health care provider, if necessary.  . Regular physical exercise is one of the most important things you can do for your health. Most adults should get at least 150 minutes of moderate-intensity exercise (any activity that increases your heart rate and causes you to sweat) each week. In addition, most adults need muscle-strengthening exercises on 2 or more days a week.  Silver Sneakers may be a benefit available to you. To determine eligibility, you may visit the website: www.silversneakers.com or contact program at 808 302 9982 Mon-Fri between 8AM-8PM.   . Maintain a healthy weight. The body mass index (BMI) is a screening tool to identify possible weight problems. It provides an estimate of body fat based on height and weight. Your health care provider can find your BMI and can help you achieve or maintain a healthy weight.   For adults 20 years and older: ? A BMI below 18.5 is considered underweight. ? A BMI of 18.5 to 24.9 is normal. ? A BMI of 25 to 29.9 is considered overweight. ? A BMI of 30 and above is considered obese.   . Maintain normal blood lipids and cholesterol levels by exercising and minimizing your intake of saturated fat. Eat a balanced diet with plenty of fruit and vegetables. Blood tests for lipids and cholesterol should begin  at age 59 and be repeated every 5 years. If your lipid or cholesterol levels are high, you are over 50, or you are at high risk for heart disease, you may need your cholesterol levels checked more frequently. Ongoing high lipid and cholesterol levels should be treated with medicines if diet and exercise are not working.  . If you smoke, find out from your health care provider how to quit. If you do not use tobacco, please do not start.  . If you choose to drink alcohol, please do not consume more than 2 drinks per day. One drink is considered to be 12 ounces (355 mL)  of beer, 5 ounces (148 mL) of wine, or 1.5 ounces (44 mL) of liquor.  . If you are 65-7 years old, ask your health care provider if you should take aspirin to prevent strokes.  . Use sunscreen. Apply sunscreen liberally and repeatedly throughout the day. You should seek shade when your shadow is shorter than you. Protect yourself by wearing long sleeves, pants, a wide-brimmed hat, and sunglasses year round, whenever you are outdoors.  . Once a month, do a whole body skin exam, using a mirror to look at the skin on your back. Tell your health care provider of new moles, moles that have irregular borders, moles that are larger than a pencil eraser, or moles that have changed in shape or color.

## 2015-11-05 NOTE — Progress Notes (Signed)
Pre visit review using our clinic review tool, if applicable. No additional management support is needed unless otherwise documented below in the visit note. 

## 2015-11-06 NOTE — Progress Notes (Signed)
I reviewed health advisor's note, was available for consultation, and agree with documentation and plan.  

## 2015-11-07 ENCOUNTER — Encounter: Payer: Medicare Other | Admitting: Primary Care

## 2015-11-17 ENCOUNTER — Ambulatory Visit (INDEPENDENT_AMBULATORY_CARE_PROVIDER_SITE_OTHER): Payer: Medicare Other | Admitting: Internal Medicine

## 2015-11-17 ENCOUNTER — Encounter: Payer: Self-pay | Admitting: Internal Medicine

## 2015-11-17 VITALS — BP 132/88 | HR 73 | Ht 65.5 in | Wt 190.0 lb

## 2015-11-17 DIAGNOSIS — G4733 Obstructive sleep apnea (adult) (pediatric): Secondary | ICD-10-CM | POA: Diagnosis not present

## 2015-11-17 DIAGNOSIS — F17210 Nicotine dependence, cigarettes, uncomplicated: Secondary | ICD-10-CM | POA: Diagnosis not present

## 2015-11-17 NOTE — Progress Notes (Signed)
[facility] Consultation      MRN# [id number] Brandon Lopez 1955-04-26   CC: Chief Complaint  Patient presents with  . Follow-up    OSA: not feeling rested everyday:      Events  Patient is a 60 year old male past medical history of hyperlipidemia, cervical neck pain, COPD, essential hypertension, obstructive sleep apnea diagnosed 2-3 years ago at [facility] and was never able to obtain his equipment due to financial and insurance reasons; seen in consultation for obstructive sleep apnea. Review of chart showed that he had a sleep study done on 10/16/2012, noted to have severe sleep apnea with AHI of 92.9 and a SPO2 nadir of 66%; he was placed on CPAP 7 cm it was not effective and he was recommended for a full CPAP titration study; his CPAP titration study showed that he needed BiPAP 18/10 cm of water. Patient follow-up with [facility] and the following was noted "We will start fixed BiPAP at 22/14 cm H2O via [place]. Will lower to 20/12 cm H2O if needed."  He saw [facility] in October 2016, at that time smoking cessation was heavily discussed, he was noted to have central sleep apnea along with COPD, but he has not done any equipment due to financial and insurance purposes.  11/17/15 - ROV - presents today for a follow up visit Still smoking 2ppd Still with some mild SOB, but much improved since starting back Bipap.  States that still has morning fatigue, compliance report with 87% with AHI=8 on CPAP He was previously on Bipap when followed by [facility], however based on his last titration study was placed on CPAP 10cm H2O.  Also, stated that he was given a mask that had magnets and unknowlingly this interfered with his pacemaker for about 1 week, causing more sob and chest burning, mask changed and symptoms resolved, but would like a follow with EP to have pacemaker evaluated.    Review of Systems  Constitutional: Negative for chills and fever.   Respiratory: Positive for shortness of breath. Negative for cough, sputum production and wheezing.   Cardiovascular: Negative for chest pain.  Gastrointestinal: Negative for heartburn.  Skin: Negative for rash.  Neurological: Negative for dizziness and headaches.  Endo/Heme/Allergies: Does not bruise/bleed easily.  Psychiatric/Behavioral: Negative for depression.      Allergies:  Simvastatin  Physical Examination:  VS: BP 132/88 (BP Location: Left Arm, Cuff Size: Normal)   Pulse 73   Ht 5' 5.5" (1.664 m)   Wt 190 lb (86.2 kg)   SpO2 96%   BMI 31.14 kg/m   General Appearance: No distress  HEENT: PERRLA, no ptosis, no other lesions noticed Pulmonary:normal breath sounds., diaphragmatic excursion normal.No wheezing, No rales   Cardiovascular:  Normal S1,S2.  No m/r/g.     Abdomen:Exam: Benign, Soft, non-tender, No masses  Skin:   warm, no rashes, no ecchymosis  Extremities: normal, no cyanosis, clubbing, warm with normal capillary refill.     Assessment and Plan: 60 year old male with past medical history of COPD, OSA/CSA, tobacco abuse, seen for follow-up visit OSA (obstructive sleep apnea) Show with severe obstructive sleep apnea along with central sleep apnea. Had a sleep study done in 2014 along with 2 titration studies at [facility], requiring BiPAP 22/14, did not obtain actual machine due to insurance issues and financial issues.  Patient recently [employer] and is more financially stable, can obtain a machine at this time.  Recent titration study done with CPAP,  required 10cm H20, but still with fatigue and AHI~8.  Plan: -cont with current Cpap setting -follow up with Sleep specialist to evaluate need for bipap and optimize OSA regimen  Cigarette smoker Tobacco Cessation - Counseling regarding benefits of smoking cessation strategies was provided for more than 3 min. - Educated that at this time smoking- cessation represents the single most  important step that patient can take to enhance the length and quality of live. - Educated patient regarding alternatives of behavior interventions, pharmacotherapy including NRT and non-nicotine therapy such, and combinations of both. - Patient at this time is still smoking 2ppd, advice to cut back and eventually quit.        Updated Medication List Outpatient Encounter Prescriptions as of 11/17/2015  Medication Sig  . albuterol (PROVENTIL HFA;VENTOLIN HFA) 108 (90 BASE) MCG/ACT inhaler Inhale 2 puffs into the lungs every 6 (six) hours as needed.  Marland Kitchen amLODipine (NORVASC) 10 MG tablet Take 1 tablet (10 mg total) by mouth daily.  Marland Kitchen aspirin 81 MG tablet Take 81 mg by mouth daily.  Marland Kitchen atorvastatin (LIPITOR) 20 MG tablet Take 1 tablet (20 mg total) by mouth every evening.  . budesonide-formoterol (SYMBICORT) 160-4.5 MCG/ACT inhaler Inhale 2 puffs into the lungs 2 (two) times daily.  . citalopram (CELEXA) 40 MG tablet TAKE ONE TABLET BY MOUTH ONCE DAILY  . gabapentin (NEURONTIN) 100 MG capsule Take 1 capsule (100 mg total) by mouth at bedtime. Reported on 08/06/2015  . loratadine (CLARITIN) 10 MG tablet Take 20 mg by mouth daily.   No facility-administered encounter medications on file as of 11/17/2015.     Orders for this visit: No orders of the defined types were placed in this encounter.   Thank  you for the visitation and for allowing  [facility] to assist in the care of your patient. Our recommendations are noted above.  Please contact us if we can be of further service.  Vilinda Boehringer, MD [facility] Office Number: [phone]  Note: This note was prepared with Dragon dictation along with smaller phrase technology. Any transcriptional errors that result from this process are unintentional.

## 2015-11-17 NOTE — Assessment & Plan Note (Signed)
Show with severe obstructive sleep apnea along with central sleep apnea. Had a sleep study done in 2014 along with 2 titration studies at [facility], requiring BiPAP 22/14, did not obtain actual machine due to insurance issues and financial issues.  Patient recently [employer] and is more financially stable, can obtain a machine at this time.  Recent titration study done with CPAP, required 10cm H20, but still with fatigue and AHI~8.  Plan: -cont with current Cpap setting -follow up with Sleep specialist to evaluate need for bipap and optimize OSA regimen

## 2015-11-17 NOTE — Assessment & Plan Note (Signed)
Tobacco Cessation - Counseling regarding benefits of smoking cessation strategies was provided for more than 3 min. - Educated that at this time smoking- cessation represents the single most important step that patient can take to enhance the length and quality of live. - Educated patient regarding alternatives of behavior interventions, pharmacotherapy including NRT and non-nicotine therapy such, and combinations of both. - Patient at this time is still smoking 2ppd, advice to cut back and eventually quit.

## 2015-11-17 NOTE — Patient Instructions (Addendum)
Follow up with Dr. Juanell Fairly in 1 month - cont with Bipap - we will setup an appointment for you to follow up with Dr. Caryl Comes (Cardiology-EP) for pacemaker eval.  - cont with diet and exercise.  - cut back on smoking and eventually quit.

## 2015-11-18 ENCOUNTER — Ambulatory Visit (INDEPENDENT_AMBULATORY_CARE_PROVIDER_SITE_OTHER): Payer: Medicare Other | Admitting: Primary Care

## 2015-11-18 VITALS — BP 130/78 | HR 68 | Temp 97.8°F | Ht 66.0 in | Wt 191.8 lb

## 2015-11-18 DIAGNOSIS — Z950 Presence of cardiac pacemaker: Secondary | ICD-10-CM

## 2015-11-18 DIAGNOSIS — Z1159 Encounter for screening for other viral diseases: Secondary | ICD-10-CM

## 2015-11-18 DIAGNOSIS — M542 Cervicalgia: Secondary | ICD-10-CM

## 2015-11-18 DIAGNOSIS — E785 Hyperlipidemia, unspecified: Secondary | ICD-10-CM

## 2015-11-18 DIAGNOSIS — F32A Depression, unspecified: Secondary | ICD-10-CM

## 2015-11-18 DIAGNOSIS — J449 Chronic obstructive pulmonary disease, unspecified: Secondary | ICD-10-CM | POA: Diagnosis not present

## 2015-11-18 DIAGNOSIS — F419 Anxiety disorder, unspecified: Secondary | ICD-10-CM

## 2015-11-18 DIAGNOSIS — I10 Essential (primary) hypertension: Secondary | ICD-10-CM | POA: Diagnosis not present

## 2015-11-18 DIAGNOSIS — Z23 Encounter for immunization: Secondary | ICD-10-CM | POA: Diagnosis not present

## 2015-11-18 DIAGNOSIS — F329 Major depressive disorder, single episode, unspecified: Secondary | ICD-10-CM

## 2015-11-18 DIAGNOSIS — G4733 Obstructive sleep apnea (adult) (pediatric): Secondary | ICD-10-CM

## 2015-11-18 DIAGNOSIS — F418 Other specified anxiety disorders: Secondary | ICD-10-CM

## 2015-11-18 MED ORDER — ZOSTER VACCINE LIVE 19400 UNT/0.65ML ~~LOC~~ SUSR: 0.6500 mL | Freq: Once | SUBCUTANEOUS | 0 refills | Status: AC

## 2015-11-18 NOTE — Assessment & Plan Note (Signed)
Much improved since initiation of atorvastatin 20 mg. LFT's stable. Continue same.

## 2015-11-18 NOTE — Assessment & Plan Note (Signed)
Stable on Amlodipine, mild ankle edema, not bothersome. He will notify if edema becomes bothersome, may switch to HCTZ. Continue current regimen.

## 2015-11-18 NOTE — Assessment & Plan Note (Signed)
Working with pulmonology for adjustments to CPAP, may be getting BiPap, will be following with sleep specialist soon.

## 2015-11-18 NOTE — Assessment & Plan Note (Signed)
Improved overall, using gabapentin sparingly.

## 2015-11-18 NOTE — Progress Notes (Signed)
Pre visit review using our clinic review tool, if applicable. No additional management support is needed unless otherwise documented below in the visit note. 

## 2015-11-18 NOTE — Assessment & Plan Note (Signed)
Stable on Symbicort and Albuterol. Uses albuterol infrequently. Exam unremarkable.  Continue current regimen.

## 2015-11-18 NOTE — Assessment & Plan Note (Signed)
Follows with Cards, due for recheck in December 2017.

## 2015-11-18 NOTE — Assessment & Plan Note (Signed)
Stable on Citalopram, continue same.

## 2015-11-18 NOTE — Patient Instructions (Addendum)
Check with your pharmacy and insurance company to see if they will cover the Shingles vaccination.  Complete lab work prior to leaving today. I will notify you of your results once received.   Please notify me if your swelling becomes bothersome.   Try switching to Zyrtec and take this at bedtime.   Follow up in 1 year for your annual wellness visit/physical or sooner if needed.  It was a pleasure to see you today!

## 2015-11-18 NOTE — Progress Notes (Signed)
Subjective:    Patient ID: Brandon Lopez, male    DOB: 1955-09-30, 60 y.o.   MRN: [id number]  HPI  Brandon Lopez is a 60 year old male who presents today for follow up from [facility].  1) COPD/OSA: Currently managed on Symbicort and Albuterol. His shortness of breath has significantly improved since initiation of Symbicort. He's not used his Albuterol in several weeks, and is using Symbicort daily. Also recently saw pulmonology and is in the process of tweaking his CPAP machine, may be getting BiPap. He will be going to a sleep specialist as he's not noticed much improvement with his CPAP.   2) Hyperlipidemia: Currently managed on atorvastatin 20 mg tablets. Lipid Panel and LFT's in October with TC of 160, LDL of 99 which is an improvement from July 2017.  3) Essential Hypertension: Currently managed on Amlodipine 10 mg. Previously managed on Lisinopril-HCTZ 40/25. Lisinopril caused aggravating cough and was therefore switched to Amlodipine. He has noticed some bilateral lower ankle edema over the past several months. This is not bothersome. His BP has been stable at home since switching to Amlodipine. Denies chest pain, dizziness, visual changes.  4) Cervical Neck Pain: Overall improved and is taking gabapentin as needed 1-2 times every 1-2 weeks. He has no complaints today.  Review of Systems  Constitutional: Negative for fatigue.  Respiratory: Negative for cough, shortness of breath and wheezing.   Cardiovascular: Negative for chest pain.  Musculoskeletal: Positive for arthralgias.  Neurological: Negative for dizziness and headaches.  Psychiatric/Behavioral: Negative for sleep disturbance.       Past Medical History:  Diagnosis Date  . Arthritis   . CHF (congestive heart failure) ([place])   . COPD (chronic obstructive pulmonary disease) ([place])   . Depression   . Diverticulosis    Found during colonoscopy  . Hypertension   . Kidney stones   . 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 Presence of  permanent cardiac pacemaker    Biotronik; Dayna Barker DR-T [id number]; placed 08/2012  . Sleep apnea      Social History   Social History  . Marital status: Single    Spouse name: N/A  . Number of children: N/A  . Years of education: N/A   Occupational History  . Not on file.   Social History Main Topics  . Smoking status: Heavy Tobacco Smoker    Packs/day: 2.00    Years: 40.00    Types: Cigarettes  . Smokeless tobacco: Never Used  . Alcohol use 7.2 oz/week    12 Cans of beer per week  . Drug use:     Types: Marijuana  . Sexual activity: Not on file   Other Topics Concern  . Not on file   Social History Narrative  . No narrative on file    Past Surgical History:  Procedure Laterality Date  . APPENDECTOMY  age 82  . CARDIAC CATHETERIZATION     7 years ago  . COLONOSCOPY N/A 05/27/2014   Procedure: COLONOSCOPY;  Surgeon: Hulen Luster, MD;  Location: [facility] ENDOSCOPY;  Service: Gastroenterology;  Laterality: N/A;  . INSERT / REPLACE / REMOVE PACEMAKER     3 years ago    Family History  Problem Relation Age of Onset  . Arthritis Mother   . Heart disease Father   . Hyperlipidemia Father   . Hypertension Father   . Cancer Paternal Grandfather     Allergies  Allergen Reactions  . Simvastatin Diarrhea    Current Outpatient Prescriptions  on File Prior to Visit  Medication Sig Dispense Refill  . albuterol (PROVENTIL HFA;VENTOLIN HFA) 108 (90 BASE) MCG/ACT inhaler Inhale 2 puffs into the lungs every 6 (six) hours as needed.    Marland Kitchen amLODipine (NORVASC) 10 MG tablet Take 1 tablet (10 mg total) by mouth daily. 90 tablet 3  . aspirin 81 MG tablet Take 81 mg by mouth daily.    Marland Kitchen atorvastatin (LIPITOR) 20 MG tablet Take 1 tablet (20 mg total) by mouth every evening. 90 tablet 3  . budesonide-formoterol (SYMBICORT) 160-4.5 MCG/ACT inhaler Inhale 2 puffs into the lungs 2 (two) times daily. 10.2 g 0  . citalopram (CELEXA) 40 MG tablet TAKE ONE TABLET BY MOUTH ONCE DAILY 90 tablet 2  .  gabapentin (NEURONTIN) 100 MG capsule Take 1 capsule (100 mg total) by mouth at bedtime. Reported on 08/06/2015 90 capsule 0  . loratadine (CLARITIN) 10 MG tablet Take 20 mg by mouth daily.     No current facility-administered medications on file prior to visit.     BP 130/78   Pulse 68   Temp 97.8 F (36.6 C) (Oral)   Ht 5\' 6"  (1.676 m)   Wt 191 lb 12.8 oz (87 kg)   SpO2 96%   BMI 30.96 kg/m    Objective:   Physical Exam  Constitutional: He is oriented to person, place, and time. He appears well-nourished.  HENT:  Right Ear: Tympanic membrane and ear canal normal.  Left Ear: Tympanic membrane and ear canal normal.  Nose: Nose normal. Right sinus exhibits no maxillary sinus tenderness and no frontal sinus tenderness. Left sinus exhibits no maxillary sinus tenderness and no frontal sinus tenderness.  Mouth/Throat: Oropharynx is clear and moist.  Eyes: Conjunctivae and EOM are normal. Pupils are equal, round, and reactive to light.  Neck: Neck supple. Carotid bruit is not present. No thyromegaly present.  Cardiovascular: Normal rate, regular rhythm and normal heart sounds.   Pulmonary/Chest: Effort normal and breath sounds normal. He has no wheezes. He has no rales.  Abdominal: Soft. Bowel sounds are normal. There is no tenderness.  Musculoskeletal: Normal range of motion.  Neurological: He is alert and oriented to person, place, and time. He has normal reflexes. No cranial nerve deficit.  Skin: Skin is warm and dry.  Psychiatric: He has a normal mood and affect.          Assessment & Plan:

## 2015-11-19 LAB — HEPATITIS C ANTIBODY: HCV Ab: NEGATIVE

## 2015-12-03 ENCOUNTER — Encounter: Payer: Self-pay | Admitting: Internal Medicine

## 2015-12-07 NOTE — Progress Notes (Signed)
* [facility]     Assessment and Plan:  Severe OSA with excessive daytime sleepiness.  -AHI of 93, CPAP titrated to 18, which was not adequate did not achieve REM sleep at that setting. Therefore, the patient underwent a BiPAP titration study on 10/30/2012 and was titrated to a BiPAP setting of 22/14, which appeared adequate.   Central sleep apnea.  -Possibly treatment emergent sleep apnea. Doubt that this is primary.   COPD/Emphysema.  -Continue symbicort.   Nicotine Abuse.  -I discussed with him the importance of smoking cessation today and his disease would continue to progress if he continues to smoke.  Flu shot: received this season.  Pneumovax: 2016  Date: 12/07/2015  MRN# [id number] Brandon Lopez 05/23/1955   Brandon Lopez is a 60 y.o. old male seen in follow up for chief complaint of  Chief Complaint  Patient presents with  . Follow-up    pt state he wears cpap avg 8hr nightly. pt states he isn't waking feeling well rested. DME:[facility]     HPI:  Patient is a 60 year old male past medical history of hyperlipidemia, cervical neck pain, COPD, essential hypertension, obstructive sleep apnea diagnosed 2-3 years ago at [facility] and was never able to obtain his equipment due to financial and insurance reasons. He is also a smoker with dyspnea and has been started on symbicort.   He is smoking about 2 ppd. He is on symbicort 2 puffs twice daily.   Per review of old notes, the patient had a split-night sleep study on 10/16/2012 at [facility].  this showed initial apnea hypopnea index of 93. The patient was started on CPAP and titrated to 7 cm of water with a residual AHI of 7.5. Subsequently, the patient had a CPAP titration study on  10/14/2012 with CPAP titrated up to a pressure of 18. The patient still had snoring and RERAs present at that level. He did not achieve REM sleep at that setting. Therefore, the patient underwent a BiPAP titration study on  10/30/2012 and was titrated to a BiPAP setting of 22/14 with an AHI of 21.6 due to central apneas. He did achieve REM sleep at that setting. Per his last note from [facility] on 11/06/2012 lowering to  20/12 cm H2O could be considered.   Review compliance report from 10/10-11/8/17:90% compliance, average usage on days used his 8 hour 17 minutes, CPAP is set at 10, with an EPR 3. Minimal leaks. Residual AHI is 8.7  CXR 10/14/14: changes of chronic bronchitis.   No flowsheet data found.  Pulmonary Functions Testing Results:  No results found for: FEV1, FVC, FEV1FVC, TLC, DLCO   Medication:   Outpatient Encounter Prescriptions as of 12/08/2015  Medication Sig  . albuterol (PROVENTIL HFA;VENTOLIN HFA) 108 (90 BASE) MCG/ACT inhaler Inhale 2 puffs into the lungs every 6 (six) hours as needed.  Marland Kitchen amLODipine (NORVASC) 10 MG tablet Take 1 tablet (10 mg total) by mouth daily.  Marland Kitchen aspirin 81 MG tablet Take 81 mg by mouth daily.  Marland Kitchen atorvastatin (LIPITOR) 20 MG tablet Take 1 tablet (20 mg total) by mouth every evening.  . budesonide-formoterol (SYMBICORT) 160-4.5 MCG/ACT inhaler Inhale 2 puffs into the lungs 2 (two) times daily.  . citalopram (CELEXA) 40 MG tablet TAKE ONE TABLET BY MOUTH ONCE DAILY  . gabapentin (NEURONTIN) 100 MG capsule Take 1 capsule (100 mg total) by mouth at bedtime. Reported on 08/06/2015  . loratadine (CLARITIN) 10 MG tablet Take 20 mg by mouth daily.  No facility-administered encounter medications on file as of 12/08/2015.      Allergies:  Simvastatin  Review of Systems: Gen:  Denies  fever, sweats. HEENT: Denies blurred vision. Cvc:  No dizziness, chest pain or heaviness Resp:   Denies sputum porduction. Has constant cough.  Gi: Denies swallowing difficulty, Gu:  Denies bladder incontinence, burning urine Ext:   No Joint pain, stiffness. Skin: No skin rash, easy bruising. Endoc:  No polyuria, polydipsia. Psych: No depression, insomnia. Other:  All other systems were  reviewed and found to be negative other than what is mentioned in the HPI.   Physical Examination:   VS: BP 134/72 (BP Location: Left Arm, Cuff Size: Normal)   Pulse 77   Ht 5\' 5"  (1.651 m)   Wt 190 lb 3.2 oz (86.3 kg)   SpO2 94%   BMI 31.65 kg/m   General Appearance: No distress  Neuro:without focal findings,  speech normal,  HEENT: PERRLA, EOM intact. Pulmonary: normal breath sounds, No wheezing.   CardiovascularNormal S1,S2.  No m/r/g.   Abdomen: Benign, Soft, non-tender. Renal:  No costovertebral tenderness  GU:  Not performed at this time. Endoc: No evident thyromegaly, no signs of acromegaly. Skin:   warm, no rash. Extremities: normal, no cyanosis, clubbing.   LABORATORY PANEL:   CBC No results for input(s): WBC, HGB, HCT, PLT in the last 168 hours. ------------------------------------------------------------------------------------------------------------------  Chemistries  No results for input(s): NA, K, CL, CO2, GLUCOSE, BUN, CREATININE, CALCIUM, MG, AST, ALT, ALKPHOS, BILITOT in the last 168 hours.  Invalid input(s): GFRCGP ------------------------------------------------------------------------------------------------------------------  Cardiac Enzymes No results for input(s): TROPONINI in the last 168 hours. ------------------------------------------------------------  RADIOLOGY:   No results found for this or any previous visit. Results for orders placed in visit on 10/14/14  DG Chest 2 View   Narrative CLINICAL DATA:  Chronic progressive cough for 1 month.  EXAM: CHEST  2 VIEW  COMPARISON:  02/07/2014  FINDINGS: Heart size and pulmonary vascularity are normal. Pacemaker in place. Slight peribronchial thickening which can be seen with bronchitis. No consolidative infiltrates or effusions. No osseous abnormality.  IMPRESSION: Bronchitic changes.   Electronically Signed   By: Lorriane Shire M.D.   On: 10/14/2014 16:54     ------------------------------------------------------------------------------------------------------------------  Thank  you for allowing [facility] to assist in the care of your patient. Our recommendations are noted above.  Please contact us if we can be of further service.   Marda Stalker, MD.  [facility] Office Number: [phone]  Patricia Pesa, M.D.  Vilinda Boehringer, M.D.  Merton Border, M.D

## 2015-12-08 ENCOUNTER — Encounter: Payer: Self-pay | Admitting: Internal Medicine

## 2015-12-08 ENCOUNTER — Ambulatory Visit (INDEPENDENT_AMBULATORY_CARE_PROVIDER_SITE_OTHER): Payer: Medicare Other | Admitting: Internal Medicine

## 2015-12-08 VITALS — BP 134/72 | HR 77 | Ht 65.0 in | Wt 190.2 lb

## 2015-12-08 DIAGNOSIS — J449 Chronic obstructive pulmonary disease, unspecified: Secondary | ICD-10-CM

## 2015-12-08 DIAGNOSIS — G4733 Obstructive sleep apnea (adult) (pediatric): Secondary | ICD-10-CM | POA: Diagnosis not present

## 2015-12-08 DIAGNOSIS — Z720 Tobacco use: Secondary | ICD-10-CM

## 2015-12-08 DIAGNOSIS — F17210 Nicotine dependence, cigarettes, uncomplicated: Secondary | ICD-10-CM | POA: Diagnosis not present

## 2015-12-08 NOTE — Addendum Note (Signed)
Addended by: Maryanna Shape A on: 12/08/2015 10:42 AM   Modules accepted: Orders

## 2015-12-08 NOTE — Patient Instructions (Addendum)
--  Quitting smoking is the most important thing that you can do for your health.  --Quitting smoking will have greater positive affect on your health than any medicine that we can give you.   --Will start bipap 22/14, we will need to obtain copies of old sleep testing from [facility].    --Full pulmonary function testing in 3 months, and follow up after that.

## 2015-12-17 ENCOUNTER — Other Ambulatory Visit: Payer: Self-pay | Admitting: Primary Care

## 2015-12-17 DIAGNOSIS — J449 Chronic obstructive pulmonary disease, unspecified: Secondary | ICD-10-CM

## 2015-12-17 NOTE — Telephone Encounter (Signed)
Ok to refill? Electronically refill request for   budesonide-formoterol (SYMBICORT) 160-4.5 MCG/ACT inhaler  Last prescribed on 01/02/2015. Last seen on 11/18/2015.

## 2015-12-30 ENCOUNTER — Ambulatory Visit (INDEPENDENT_AMBULATORY_CARE_PROVIDER_SITE_OTHER): Payer: Medicare Other | Admitting: Internal Medicine

## 2015-12-30 ENCOUNTER — Encounter: Payer: Self-pay | Admitting: Internal Medicine

## 2015-12-30 VITALS — BP 120/70 | HR 60 | Ht 60.0 in | Wt 191.5 lb

## 2015-12-30 DIAGNOSIS — I495 Sick sinus syndrome: Secondary | ICD-10-CM

## 2015-12-30 DIAGNOSIS — I10 Essential (primary) hypertension: Secondary | ICD-10-CM | POA: Diagnosis not present

## 2015-12-30 DIAGNOSIS — Z950 Presence of cardiac pacemaker: Secondary | ICD-10-CM

## 2015-12-30 NOTE — Patient Instructions (Addendum)
Medication Instructions: - Your physician recommends that you continue on your current medications as directed. Please refer to the Current Medication list given to you today.  Labwork: - none ordered  Procedures/Testing: - none ordered  Follow-Up: - Remote monitoring is used to monitor your Pacemaker of ICD from home. This monitoring reduces the number of office visits required to check your device to one time per year. It allows [place] to keep an eye on the functioning of your device to ensure it is working properly. You are scheduled for a device check from home on 03/30/16. You may send your transmission at any time that day. If you have a wireless device, the transmission will be sent automatically. After your physician reviews your transmission, you will receive a postcard with your next transmission date.  - Your physician wants you to follow-up in: 1 year with Dr. Caryl Comes. You will receive a reminder letter in the mail two months in advance. If you don't receive a letter, please call our office to schedule the follow-up appointment.  Any Additional Special Instructions Will Be Listed Below (If Applicable).     If you need a refill on your cardiac medications before your next appointment, please call your pharmacy.

## 2015-12-30 NOTE — Progress Notes (Signed)
Patient Care Team: Pleas Koch, NP as PCP - General (Nurse Practitioner)   HPI  Brandon Lopez is a 60 y.o. male Seen in follow-up for pacemaker implanted at [facility] for dyspnea and presyncope.  When seen 5/16 he had complaints of dyspnea on exertion with exertional chest discomfort.  Records and Results Reviewed DATE TEST    6/16    Echo   EF 55-60 %  normal  5/16    Myoview   EF 50 %   No ischemia Echo done to clarify EF          No syncope  No palpitations  No hx of afib  TERF  HTN  CHAdsvasc 1    Past Medical History:  Diagnosis Date  . Arthritis   . CHF (congestive heart failure) ([place])   . COPD (chronic obstructive pulmonary disease) ([place])   . Depression   . Diverticulosis    Found during colonoscopy  . Hypertension   . Kidney stones   . 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 Presence of permanent cardiac pacemaker    Biotronik; Dayna Barker DR-T [id number]; placed 08/2012  . Sleep apnea     Past Surgical History:  Procedure Laterality Date  . APPENDECTOMY  age 58  . CARDIAC CATHETERIZATION     7 years ago  . COLONOSCOPY N/A 05/27/2014   Procedure: COLONOSCOPY;  Surgeon: Hulen Luster, MD;  Location: [facility] ENDOSCOPY;  Service: Gastroenterology;  Laterality: N/A;  . INSERT / REPLACE / REMOVE PACEMAKER     3 years ago    Current Outpatient Prescriptions  Medication Sig Dispense Refill  . albuterol (PROVENTIL HFA;VENTOLIN HFA) 108 (90 BASE) MCG/ACT inhaler Inhale 2 puffs into the lungs every 6 (six) hours as needed.    Marland Kitchen amLODipine (NORVASC) 10 MG tablet Take 1 tablet (10 mg total) by mouth daily. 90 tablet 3  . aspirin 81 MG tablet Take 81 mg by mouth daily.    Marland Kitchen atorvastatin (LIPITOR) 20 MG tablet Take 1 tablet (20 mg total) by mouth every evening. 90 tablet 3  . citalopram (CELEXA) 40 MG tablet TAKE ONE TABLET BY MOUTH ONCE DAILY 90 tablet 2  . gabapentin (NEURONTIN) 100 MG capsule Take 1 capsule (100 mg total) by mouth at bedtime. Reported on 08/06/2015 90  capsule 0  . loratadine (CLARITIN) 10 MG tablet Take 20 mg by mouth daily.    . SYMBICORT 160-4.5 MCG/ACT inhaler INHALE TWO PUFFS BY MOUTH TWICE DAILY 3 Inhaler 3   No current facility-administered medications for this visit.     Allergies  Allergen Reactions  . Simvastatin Diarrhea      Review of Systems negative except from HPI and PMH  Physical Exam BP 120/70 (BP Location: Left Arm, Patient Position: Sitting, Cuff Size: Normal)   Pulse 60   Ht 5' (1.524 m)   Wt 191 lb 8 oz (86.9 kg)   BMI 37.40 kg/m  Well developed and well nourished in no acute distress HENT normal E scleral and icterus clear Neck Supple JVP flat; carotids brisk and full Clear to ausculation Device pocket well healed; without hematoma or erythema.  There is no tethering right sided Regular rate and rhythm, no murmurs gallops or rub Soft with active bowel sounds No clubbing cyanosis  Edema Alert and oriented, grossly normal motor and sensory function Skin Warm and Dry   Assessment and  Plan  Sinus bradycardia  Pacemaker-Biotronik The patient's device was interrogated and the information was  fully reviewed.  The device was reprogrammed to increase the upper rate limit so as to achieve a broader heart rate excursion  Atrial fibrillation-paroxysmal  Dyspnea on exertion  Chest pain consistent with angina  Hypertension  Sleep apnea-finally treated    BP well controlled  No itnerval syncope  Some AFib, but < 12hr  With Chadsvasc prob not yet indicated to Rx with anticoagulation        Current medicines are reviewed at length with the patient today .  The patient does not  have concerns regarding medicines.

## 2016-01-06 ENCOUNTER — Encounter: Payer: Self-pay | Admitting: Internal Medicine

## 2016-01-13 ENCOUNTER — Telehealth: Payer: Self-pay | Admitting: Internal Medicine

## 2016-01-13 NOTE — Telephone Encounter (Signed)
Pt states once his machine reaches the highest pressure, it starts to make a "farting noise" Pt is requesting the pressure be lowered. I have advise pt to reach out to [facility]. I have provided pt with [place] number.  Pt voiced understanding and had no further questions. Nothing further needed at this time.

## 2016-01-13 NOTE — Telephone Encounter (Signed)
Pt calling stating he received his Bpap machine  He calling stating he is having some issues with it He states it is too loud at night "making farting noises"  He states the pressure may be too much  Please advise

## 2016-01-19 ENCOUNTER — Encounter: Payer: Self-pay | Admitting: Internal Medicine

## 2016-01-20 ENCOUNTER — Encounter: Payer: Self-pay | Admitting: Internal Medicine

## 2016-01-28 ENCOUNTER — Telehealth: Payer: Self-pay | Admitting: Internal Medicine

## 2016-01-28 DIAGNOSIS — G4733 Obstructive sleep apnea (adult) (pediatric): Secondary | ICD-10-CM

## 2016-01-28 NOTE — Telephone Encounter (Signed)
I only got involved in his case recently as he was previously followed by Dr Stevenson Clinch. Change to auto-bipap; Low=7, High=18; Pressure support=5

## 2016-01-28 NOTE — Telephone Encounter (Signed)
Spoke with pt. States that he is having issues with his BiPAP machine. Reports that the pressure is to strong. In the middle of the night, his mask will be blown off his face. Current pressure is set at 22/14. Pt has contacted [facility] about this. States that [facility] has sent the [place] office 3 requests to have his pressure changed. Pt states that if something isn't done soon, he is going to stop wearing the BiPAP.

## 2016-01-28 NOTE — Telephone Encounter (Signed)
Pt calling stating he is having issues with his Bypap machine Pressure is too much States no one is sleeping well  Already contacted [facility] They told him they already sent 3 requests for change but we have not answered Please advise

## 2016-01-28 NOTE — Telephone Encounter (Signed)
Spoke with pt. He is aware that we will change his BiPAP pressure. Order has been placed. Nothing further was needed.

## 2016-02-05 ENCOUNTER — Other Ambulatory Visit: Payer: Self-pay | Admitting: Primary Care

## 2016-02-05 DIAGNOSIS — I10 Essential (primary) hypertension: Secondary | ICD-10-CM

## 2016-03-10 ENCOUNTER — Telehealth: Payer: Self-pay | Admitting: *Deleted

## 2016-03-10 LAB — CUP PACEART INCLINIC DEVICE CHECK
Date Time Interrogation Session: 20171205172200
Implantable Lead Implant Date: 20140801
Implantable Lead Implant Date: 20140801
Implantable Lead Location: 753859
Implantable Lead Serial Number: 29356730
Implantable Lead Serial Number: 29392661
Lead Channel Impedance Value: 546 Ohm
Lead Channel Pacing Threshold Amplitude: 0.5 V
Lead Channel Sensing Intrinsic Amplitude: 2.9 mV
MDC IDC LEAD LOCATION: 753860
MDC IDC MSMT LEADCHNL RA IMPEDANCE VALUE: 507 Ohm
MDC IDC MSMT LEADCHNL RA PACING THRESHOLD PULSEWIDTH: 0.4 ms
MDC IDC MSMT LEADCHNL RV PACING THRESHOLD AMPLITUDE: 0.8 V
MDC IDC MSMT LEADCHNL RV PACING THRESHOLD PULSEWIDTH: 0.4 ms
MDC IDC MSMT LEADCHNL RV SENSING INTR AMPL: 2.9 mV
MDC IDC PG IMPLANT DT: 20140801
MDC IDC SET LEADCHNL RA PACING AMPLITUDE: 1.5 V
MDC IDC SET LEADCHNL RV PACING AMPLITUDE: 2.4 V
MDC IDC SET LEADCHNL RV PACING PULSEWIDTH: 0.4 ms
MDC IDC SET LEADCHNL RV SENSING SENSITIVITY: 2.5 mV
MDC IDC STAT BRADY RA PERCENT PACED: 88 %
MDC IDC STAT BRADY RV PERCENT PACED: 1 %
Pulse Gen Serial Number: 68053398

## 2016-03-10 NOTE — Telephone Encounter (Signed)
LMOM (DPR) advising that patient does not need to call back with monitor SN.  [facility] phone number if patient has questions or concerns.  Per Biotronik rep, monitor information should update automatically overnight.

## 2016-03-10 NOTE — Telephone Encounter (Signed)
Spoke with patient after transferring [place] to our clinic.  Need SN off of home monitor.  Patient agrees to call the [facility] when he returns home to give [place] the SN info.  He denies additional questions or concerns at this time.

## 2016-03-11 NOTE — Telephone Encounter (Signed)
Patient successfully transferred to our [facility] Monitoring clinic, monitor communicating.

## 2016-03-30 ENCOUNTER — Ambulatory Visit (INDEPENDENT_AMBULATORY_CARE_PROVIDER_SITE_OTHER): Payer: Self-pay | Admitting: *Deleted

## 2016-03-30 DIAGNOSIS — I495 Sick sinus syndrome: Secondary | ICD-10-CM

## 2016-03-30 LAB — CUP PACEART REMOTE DEVICE CHECK
Brady Statistic RA Percent Paced: 91 %
Date Time Interrogation Session: 20180306164224
Implantable Lead Implant Date: 20140801
Implantable Lead Serial Number: 29392661
Implantable Pulse Generator Implant Date: 20140801
Lead Channel Impedance Value: 488 Ohm
Lead Channel Pacing Threshold Pulse Width: 0.4 ms
Lead Channel Setting Pacing Amplitude: 1.5 V
Lead Channel Setting Sensing Sensitivity: 2.5 mV
MDC IDC LEAD IMPLANT DT: 20140801
MDC IDC LEAD LOCATION: 753859
MDC IDC LEAD LOCATION: 753860
MDC IDC LEAD SERIAL: 29356730
MDC IDC MSMT LEADCHNL RA IMPEDANCE VALUE: 507 Ohm
MDC IDC MSMT LEADCHNL RA PACING THRESHOLD AMPLITUDE: 0.8 V
MDC IDC MSMT LEADCHNL RA SENSING INTR AMPL: 2.1 mV
MDC IDC MSMT LEADCHNL RV SENSING INTR AMPL: 7.2 mV
MDC IDC SET LEADCHNL RV PACING AMPLITUDE: 2.4 V
MDC IDC SET LEADCHNL RV PACING PULSEWIDTH: 0.4 ms
MDC IDC STAT BRADY RV PERCENT PACED: 1 %
Pulse Gen Serial Number: 68053398

## 2016-03-30 NOTE — Progress Notes (Signed)
Remote pacemaker transmission.   

## 2016-03-31 ENCOUNTER — Encounter: Payer: Self-pay | Admitting: Cardiology

## 2016-04-14 ENCOUNTER — Ambulatory Visit (INDEPENDENT_AMBULATORY_CARE_PROVIDER_SITE_OTHER)
Admission: RE | Admit: 2016-04-14 | Discharge: 2016-04-14 | Disposition: A | Discharge status: Home / Self Care | Payer: PPO | Source: Ambulatory Visit | Attending: Primary Care | Admitting: Primary Care

## 2016-04-14 ENCOUNTER — Encounter: Payer: Self-pay | Admitting: Primary Care

## 2016-04-14 ENCOUNTER — Ambulatory Visit (INDEPENDENT_AMBULATORY_CARE_PROVIDER_SITE_OTHER): Payer: PPO | Admitting: Primary Care

## 2016-04-14 VITALS — BP 134/76 | HR 61 | Temp 98.5°F | Ht 65.5 in | Wt 179.1 lb

## 2016-04-14 DIAGNOSIS — G4733 Obstructive sleep apnea (adult) (pediatric): Secondary | ICD-10-CM | POA: Diagnosis not present

## 2016-04-14 DIAGNOSIS — F331 Major depressive disorder, recurrent, moderate: Secondary | ICD-10-CM

## 2016-04-14 DIAGNOSIS — R0602 Shortness of breath: Secondary | ICD-10-CM

## 2016-04-14 DIAGNOSIS — F329 Major depressive disorder, single episode, unspecified: Secondary | ICD-10-CM

## 2016-04-14 DIAGNOSIS — J449 Chronic obstructive pulmonary disease, unspecified: Secondary | ICD-10-CM

## 2016-04-14 DIAGNOSIS — F17210 Nicotine dependence, cigarettes, uncomplicated: Secondary | ICD-10-CM

## 2016-04-14 DIAGNOSIS — F419 Anxiety disorder, unspecified: Secondary | ICD-10-CM

## 2016-04-14 DIAGNOSIS — F32A Depression, unspecified: Secondary | ICD-10-CM

## 2016-04-14 DIAGNOSIS — F1010 Alcohol abuse, uncomplicated: Secondary | ICD-10-CM | POA: Diagnosis not present

## 2016-04-14 DIAGNOSIS — F418 Other specified anxiety disorders: Secondary | ICD-10-CM | POA: Diagnosis not present

## 2016-04-14 MED ORDER — BUPROPION HCL ER (SR) 150 MG PO TB12: ORAL_TABLET | ORAL | 1 refills | Status: DC

## 2016-04-14 NOTE — Assessment & Plan Note (Signed)
Uncontrolled over the past 4 months. PHQ 9 score of 17 today. Will add in Wellbutrin today as he declines therapy. Continue Citalopram.  Follow up in 6 weeks.

## 2016-04-14 NOTE — Progress Notes (Signed)
Subjective:    Patient ID: Brandon Lopez, male    DOB: [birth year], 61 y.o.   MRN: [id number]  HPI  Brandon Lopez is a 61 year old male with a history of COPD, tobacco abuse, OSA who presents today with multiple complaints.  1) Ear Fullness: He also reports nasal congestion. He was blowing his nose two weeks ago, felt a "pop" at that time to his left ear, and has since noticed decreased hearing with fullness. He denies ear pain, fevers, cough. He's been taking Claritin daily with improvement.   2) Depression: Currently managed on citalopram 40 mg. Over the past four months he's noticed increased depression. He has little motivation to do anything. He just sits on his porch and drinks 8 beers daily. He's feeling depressed as he hardly sees his girlfriend. PHQ score of 17 today. He has tried therapy in the past and didn't notice improvement and could no longer afford.  3) COPD: Over the past several weeks has felt "winded" and short of breath at rest. He is currently managed on Symbicort daily for which he's compliant to. He is worried that the inhaler is not as effective. He used his albuterol inhaler once last week and did notice improvement, he's not used this since. He denies cough, unexplained weight loss, fevers, wheezing. He's not wearing his Bi-pap as it's too noisy and uncomfortable. He sits during most of his day and is not active. He continues to smoke cigarettes but has cut back over the last several days.  Review of Systems  Constitutional: Negative for fever.  HENT: Positive for congestion. Negative for ear pain.        Ear fullness  Respiratory: Positive for shortness of breath. Negative for cough and wheezing.   Cardiovascular: Negative for chest pain.  Allergic/Immunologic: Positive for environmental allergies.  Psychiatric/Behavioral:       Depression, see HPI       Past Medical History:  Diagnosis Date  . Arthritis   . CHF (congestive heart failure) ([place])   .  COPD (chronic obstructive pulmonary disease) ([place])   . Depression   . Diverticulosis    Found during colonoscopy  . Hypertension   . Kidney stones   . 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 Presence of permanent cardiac pacemaker    Biotronik; Dayna Barker DR-T [id number]; placed 08/2012  . Sleep apnea      Social History   Social History  . Marital status: Single    Spouse name: N/A  . Number of children: N/A  . Years of education: N/A   Occupational History  . Not on file.   Social History Main Topics  . Smoking status: Heavy Tobacco Smoker    Packs/day: 2.00    Years: 40.00    Types: Cigarettes  . Smokeless tobacco: Never Used  . Alcohol use 7.2 oz/week    12 Cans of beer per week  . Drug use: Yes    Types: Marijuana  . Sexual activity: Not on file   Other Topics Concern  . Not on file   Social History Narrative  . No narrative on file    Past Surgical History:  Procedure Laterality Date  . APPENDECTOMY  age 75  . CARDIAC CATHETERIZATION     7 years ago  . COLONOSCOPY N/A 05/27/2014   Procedure: COLONOSCOPY;  Surgeon: Hulen Luster, MD;  Location: [facility] ENDOSCOPY;  Service: Gastroenterology;  Laterality: N/A;  . INSERT / REPLACE / REMOVE PACEMAKER  3 years ago    Family History  Problem Relation Age of Onset  . Arthritis Mother   . Heart disease Father   . Hyperlipidemia Father   . Hypertension Father   . Cancer Paternal Grandfather     Allergies  Allergen Reactions  . Simvastatin Diarrhea    Current Outpatient Prescriptions on File Prior to Visit  Medication Sig Dispense Refill  . albuterol (PROVENTIL HFA;VENTOLIN HFA) 108 (90 BASE) MCG/ACT inhaler Inhale 2 puffs into the lungs every 6 (six) hours as needed.    Marland Kitchen amLODipine (NORVASC) 10 MG tablet TAKE ONE TABLET BY MOUTH ONCE DAILY 90 tablet 1  . aspirin 81 MG tablet Take 81 mg by mouth daily.    Marland Kitchen atorvastatin (LIPITOR) 20 MG tablet Take 1 tablet (20 mg total) by mouth every evening. 90 tablet 3  . citalopram  (CELEXA) 40 MG tablet TAKE ONE TABLET BY MOUTH ONCE DAILY 90 tablet 2  . gabapentin (NEURONTIN) 100 MG capsule Take 1 capsule (100 mg total) by mouth at bedtime. Reported on 08/06/2015 90 capsule 0  . loratadine (CLARITIN) 10 MG tablet Take 20 mg by mouth daily.    . SYMBICORT 160-4.5 MCG/ACT inhaler INHALE TWO PUFFS BY MOUTH TWICE DAILY 3 Inhaler 3   No current facility-administered medications on file prior to visit.     BP 134/76   Pulse 61   Temp 98.5 F (36.9 C) (Oral)   Ht 5' 5.5" (1.664 m)   Wt 179 lb 1.9 oz (81.2 kg)   SpO2 95%   BMI 29.35 kg/m    Objective:   Physical Exam  Constitutional: He appears well-nourished.  Cardiovascular: Normal rate and regular rhythm.   Pulmonary/Chest: Effort normal. He has no rales.  Mild wheezing throughout, baseline. No rhonchi or diminished sounds.  Skin: Skin is warm and dry.  Psychiatric: He has a normal mood and affect.          Assessment & Plan:

## 2016-04-14 NOTE — Progress Notes (Signed)
Pre visit review using our clinic review tool, if applicable. No additional management support is needed unless otherwise documented below in the visit note. 

## 2016-04-14 NOTE — Assessment & Plan Note (Signed)
Suspect allergies to be aggravating COPD.  Will have him continue Symbicort, start using albuterol PRN for the next several days. Continue Claritin.  Discussed smoking cessation, initiated Wellbutrin. No evidence for bacterial involvement. Discussed to speak with pulmonologist regarding difficulties with BiPap machine. Discussed to become more active during the day as this could also be deconditioning secondary to lifestyle.

## 2016-04-14 NOTE — Assessment & Plan Note (Signed)
Discussed to speak with pulmonologist regarding difficulties with BiPap machine as this could be contributing to SOB symptoms.

## 2016-04-14 NOTE — Patient Instructions (Addendum)
Nasal Congestion/Ear Pressure: Try using Flonase (fluticasone) nasal spray. Instill 1 spray in each nostril twice daily.   Shortness of breath/Wheezing: Start your albuterol inhaler. Inhale 2 puffs into the lungs every 6 to 8 hours as needed for wheezing and/or shortness of breath.   Start bupropion SR 150 mg tablets for depression and smoking. Take 1 tablet by mouth every morning for three days, then increase to 1 tablet by mouth twice daily thereafter.   Call your pulmonologist and notify them that you are not wearing your BiPap.  Follow up 6 weeks for re-evaluation. It was a pleasure to see you today!

## 2016-04-14 NOTE — Assessment & Plan Note (Signed)
Tobacco abuse counseling provided today. Initiated Wellbutrin for depression in hopes that this will help with tobacco cessation.

## 2016-04-14 NOTE — Assessment & Plan Note (Signed)
Continues to drink daily. Discussed importance of cutting back.

## 2016-05-27 ENCOUNTER — Ambulatory Visit (INDEPENDENT_AMBULATORY_CARE_PROVIDER_SITE_OTHER): Payer: PPO | Admitting: Primary Care

## 2016-05-27 ENCOUNTER — Encounter: Payer: Self-pay | Admitting: Primary Care

## 2016-05-27 VITALS — BP 136/78 | HR 74 | Temp 98.2°F | Ht 65.0 in | Wt 179.4 lb

## 2016-05-27 DIAGNOSIS — F419 Anxiety disorder, unspecified: Secondary | ICD-10-CM

## 2016-05-27 DIAGNOSIS — F329 Major depressive disorder, single episode, unspecified: Secondary | ICD-10-CM

## 2016-05-27 DIAGNOSIS — R1030 Lower abdominal pain, unspecified: Secondary | ICD-10-CM | POA: Diagnosis not present

## 2016-05-27 LAB — CBC WITH DIFFERENTIAL/PLATELET
BASOS ABS: 0 10*3/uL (ref 0.0–0.1)
BASOS PCT: 0.6 % (ref 0.0–3.0)
EOS ABS: 0.1 10*3/uL (ref 0.0–0.7)
Eosinophils Relative: 1 % (ref 0.0–5.0)
HEMATOCRIT: 49.7 % (ref 39.0–52.0)
HEMOGLOBIN: 17.2 g/dL — AB (ref 13.0–17.0)
LYMPHS PCT: 32.4 % (ref 12.0–46.0)
Lymphs Abs: 2.5 10*3/uL (ref 0.7–4.0)
MCHC: 34.7 g/dL (ref 30.0–36.0)
MCV: 94.8 fl (ref 78.0–100.0)
Monocytes Absolute: 0.9 10*3/uL (ref 0.1–1.0)
Monocytes Relative: 11.5 % (ref 3.0–12.0)
Neutro Abs: 4.1 10*3/uL (ref 1.4–7.7)
Neutrophils Relative %: 54.5 % (ref 43.0–77.0)
Platelets: 237 10*3/uL (ref 150.0–400.0)
RBC: 5.24 Mil/uL (ref 4.22–5.81)
RDW: 13 % (ref 11.5–15.5)
WBC: 7.6 10*3/uL (ref 4.0–10.5)

## 2016-05-27 LAB — COMPREHENSIVE METABOLIC PANEL
ALBUMIN: 4.5 g/dL (ref 3.5–5.2)
ALK PHOS: 91 U/L (ref 39–117)
ALT: 21 U/L (ref 0–53)
AST: 17 U/L (ref 0–37)
BILIRUBIN TOTAL: 0.7 mg/dL (ref 0.2–1.2)
BUN: 7 mg/dL (ref 6–23)
CALCIUM: 9.6 mg/dL (ref 8.4–10.5)
CO2: 28 mEq/L (ref 19–32)
CREATININE: 0.65 mg/dL (ref 0.40–1.50)
Chloride: 103 mEq/L (ref 96–112)
GFR: 132.86 mL/min (ref >60.00)
Glucose, Bld: 80 mg/dL (ref 70–99)
Potassium: 3.7 mEq/L (ref 3.5–5.1)
Sodium: 137 mEq/L (ref 135–145)
TOTAL PROTEIN: 7.9 g/dL (ref 6.0–8.3)

## 2016-05-27 MED ORDER — SERTRALINE HCL 50 MG PO TABS: 50.0000 mg | ORAL_TABLET | Freq: Every day | ORAL | 0 refills | Status: DC

## 2016-05-27 NOTE — Progress Notes (Signed)
Pre visit review using our clinic review tool, if applicable. No additional management support is needed unless otherwise documented below in the visit note. 

## 2016-05-27 NOTE — Assessment & Plan Note (Signed)
No improvement with addition of Wellbutrin. Given numerous failed attempts and treatment with various medications, will send to psychiatry for further evaluation and treatment. In the interim will wean off Wellbutrin given side effects; wean off Celexa; switch to Zoloft 50 mg.  Discussed and provided specific instructions for weaning off medications and initiating Zoloft. Follow-up with psychiatry. He will call sooner if he has complications with these medications. He contracts for safety today.

## 2016-05-27 NOTE — Progress Notes (Signed)
Subjective:    Patient ID: Brandon Lopez, male    DOB: October 27, 1955, 61 y.o.   MRN: [id number]  HPI  Brandon Lopez is a 61 year old male with a history of anxiety and depression who presents today for follow up of anxiety of depression.  He was last evaluated in late March 2018 with complaints of increased depression with PHQ 9 score of 17. During that visit we added in Wellbutrin. He is currently managed on Celexa 40 mg and Wellbutrin SR 150 mg.   Since his last visit he Is compliant to his Celexa and Wellbutrin. The Wellbutrin has caused increased irritability, increased anxiety, and increased anger. He's not had either Celexa or Wellbutrin in the past 24 hours.   He has failed treatment on Effexor, Paxil, Wellbutrin in the past. His current symptoms consist of little motivation do to anything, little appetite, is sleeping for most of the day. He feels like a failure. He does have suicidal thoughts but has no plan to harm himself. He contracts for safety today.  He tried therapy several times within the past 1-2 years without improvement. He's never seen a psychiatrist. He has a history of alcohol abuse and will drink anywhere from 6-12 cans of beer daily and has done so for years.   2) Abdominal Pain: History of diverticulosis. Located to LLQ near sigmoid region that has been present intermittently for the past 7 years. Two days he experienced increased pain to the LLQ which caused him to buckle over due to pain. He did notice improvement after he released gas. He experiences daily bowel movements. He has noticed nausea. He denies fevers, bloody stools, vomiting, dysuria, difficulty urinating.   Review of Systems  Constitutional: Positive for chills. Negative for fever.  Respiratory: Negative for shortness of breath.   Cardiovascular: Negative for chest pain.  Gastrointestinal: Positive for abdominal pain and nausea. Negative for blood in stool, constipation and diarrhea.    Psychiatric/Behavioral:       See history of present illness       Past Medical History:  Diagnosis Date  . Arthritis   . CHF (congestive heart failure) ([place])   . COPD (chronic obstructive pulmonary disease) ([place])   . Depression   . Diverticulosis    Found during colonoscopy  . Hypertension   . Kidney stones   . 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 Presence of permanent cardiac pacemaker    Biotronik; Dayna Barker DR-T [id number]; placed 08/2012  . Sleep apnea      Social History   Social History  . Marital status: Single    Spouse name: N/A  . Number of children: N/A  . Years of education: N/A   Occupational History  . Not on file.   Social History Main Topics  . Smoking status: Heavy Tobacco Smoker    Packs/day: 2.00    Years: 40.00    Types: Cigarettes  . Smokeless tobacco: Never Used  . Alcohol use 7.2 oz/week    12 Cans of beer per week  . Drug use: Yes    Types: Marijuana  . Sexual activity: Not on file   Other Topics Concern  . Not on file   Social History Narrative  . No narrative on file    Past Surgical History:  Procedure Laterality Date  . APPENDECTOMY  age 27  . CARDIAC CATHETERIZATION     7 years ago  . COLONOSCOPY N/A 05/27/2014   Procedure: COLONOSCOPY;  Surgeon: Hulen Luster,  MD;  Location: [facility] ENDOSCOPY;  Service: Gastroenterology;  Laterality: N/A;  . INSERT / REPLACE / REMOVE PACEMAKER     3 years ago    Family History  Problem Relation Age of Onset  . Arthritis Mother   . Heart disease Father   . Hyperlipidemia Father   . Hypertension Father   . Cancer Paternal Grandfather     Allergies  Allergen Reactions  . Simvastatin Diarrhea    Current Outpatient Prescriptions on File Prior to Visit  Medication Sig Dispense Refill  . albuterol (PROVENTIL HFA;VENTOLIN HFA) 108 (90 BASE) MCG/ACT inhaler Inhale 2 puffs into the lungs every 6 (six) hours as needed.    Marland Kitchen amLODipine (NORVASC) 10 MG tablet TAKE ONE TABLET BY MOUTH ONCE DAILY (Patient not  taking: Reported on 05/27/2016) 90 tablet 1  . aspirin 81 MG tablet Take 81 mg by mouth daily.    Marland Kitchen atorvastatin (LIPITOR) 20 MG tablet Take 1 tablet (20 mg total) by mouth every evening. (Patient not taking: Reported on 05/27/2016) 90 tablet 3  . gabapentin (NEURONTIN) 100 MG capsule Take 1 capsule (100 mg total) by mouth at bedtime. Reported on 08/06/2015 (Patient not taking: Reported on 05/27/2016) 90 capsule 0  . loratadine (CLARITIN) 10 MG tablet Take 20 mg by mouth daily.    . SYMBICORT 160-4.5 MCG/ACT inhaler INHALE TWO PUFFS BY MOUTH TWICE DAILY (Patient not taking: Reported on 05/27/2016) 3 Inhaler 3   No current facility-administered medications on file prior to visit.     BP 136/78   Pulse 74   Temp 98.2 F (36.8 C) (Oral)   Ht 5\' 5"  (1.651 m)   Wt 179 lb 6.4 oz (81.4 kg)   SpO2 98%   BMI 29.85 kg/m    Objective:   Physical Exam  Constitutional: He appears well-nourished. He does not appear ill.  Neck: Neck supple.  Cardiovascular: Normal rate and regular rhythm.   Pulmonary/Chest: Effort normal and breath sounds normal.  Abdominal: Soft. Normal appearance and bowel sounds are normal. There is tenderness in the right lower quadrant, suprapubic area and left lower quadrant.  Skin: Skin is warm and dry.  Psychiatric: He has a normal mood and affect.          Assessment & Plan:  Abdominal pain:  Located to left lower quadrant intermittent only for years. Recent episode increase in the past couple of days. History of diverticulosis. Examination today with tenderness to right lower quadrant and left lower quadrant near sigmoid region concerning for acute diverticulitis. We'll check CBC and CMP today. We'll consider antibiotic course once labs return.  Sheral Flow, NP

## 2016-05-27 NOTE — Patient Instructions (Signed)
Complete lab work prior to leaving today. I will notify you of your results once received.   You will be contacted regarding your referral to psychiatry. Please let us know if you have not heard back within one week.   We are going to wean you off of citalopram (Celexa) 40 mg tablets and bupropion (Wellbutrin) tablets.   Cut the citalopram tablets in half and take 1/2 tablet daily for 6 days, then stop.  At the same time start sertraline (Zoloft) 50 mg tablets. Take 1/2 tablet daily for 6 days, then advance to 1 full tablet thereafter.  Start taking the bupropion once daily for 10 days, then stop.  Please call me if you have any problems with these medications.  It was a pleasure to see you today!

## 2016-05-28 ENCOUNTER — Other Ambulatory Visit: Payer: Self-pay | Admitting: Primary Care

## 2016-05-28 DIAGNOSIS — R1030 Lower abdominal pain, unspecified: Secondary | ICD-10-CM

## 2016-06-02 ENCOUNTER — Other Ambulatory Visit: Payer: Self-pay | Admitting: Primary Care

## 2016-06-02 ENCOUNTER — Ambulatory Visit (INDEPENDENT_AMBULATORY_CARE_PROVIDER_SITE_OTHER)
Admission: RE | Admit: 2016-06-02 | Discharge: 2016-06-02 | Disposition: A | Discharge status: Home / Self Care | Payer: PPO | Source: Ambulatory Visit | Attending: Primary Care | Admitting: Primary Care

## 2016-06-02 DIAGNOSIS — R1030 Lower abdominal pain, unspecified: Secondary | ICD-10-CM | POA: Diagnosis not present

## 2016-06-02 DIAGNOSIS — K5730 Diverticulosis of large intestine without perforation or abscess without bleeding: Secondary | ICD-10-CM | POA: Diagnosis not present

## 2016-06-02 MED ORDER — IOPAMIDOL (ISOVUE-300) INJECTION 61%
100.0000 mL | Freq: Once | INTRAVENOUS | Status: AC | PRN
  Administered 2016-06-02: 100 mL via INTRAVENOUS

## 2016-06-02 MED ORDER — AMOXICILLIN-POT CLAVULANATE 875-125 MG PO TABS: 1.0000 | ORAL_TABLET | Freq: Two times a day (BID) | ORAL | 0 refills | Status: DC

## 2016-06-29 ENCOUNTER — Telehealth: Payer: Self-pay | Admitting: Primary Care

## 2016-06-29 ENCOUNTER — Telehealth: Payer: Self-pay | Admitting: Cardiology

## 2016-06-29 ENCOUNTER — Encounter: Payer: PPO | Admitting: *Deleted

## 2016-06-29 ENCOUNTER — Other Ambulatory Visit: Payer: Self-pay | Admitting: Primary Care

## 2016-06-29 DIAGNOSIS — F329 Major depressive disorder, single episode, unspecified: Secondary | ICD-10-CM

## 2016-06-29 DIAGNOSIS — F419 Anxiety disorder, unspecified: Principal | ICD-10-CM

## 2016-06-29 MED ORDER — SERTRALINE HCL 50 MG PO TABS: 50.0000 mg | ORAL_TABLET | Freq: Every day | ORAL | 0 refills | Status: DC

## 2016-06-29 NOTE — Addendum Note (Signed)
Addended by: Jacqualin Combes on: 06/29/2016 04:21 PM   Modules accepted: Orders

## 2016-06-29 NOTE — Telephone Encounter (Signed)
Spoken to patient and stated that he need a refill until he sees psychiatry on 08/15/16

## 2016-06-29 NOTE — Telephone Encounter (Signed)
LMOVM reminding pt to send remote transmission.   

## 2016-06-29 NOTE — Telephone Encounter (Signed)
Pt called to request a refill of his zoloft.  Can you please call him about this.  It looks like it has been refilled already.

## 2016-06-30 ENCOUNTER — Telehealth: Payer: Self-pay

## 2016-06-30 NOTE — Telephone Encounter (Signed)
Spoke with pt informed him that we had not received a messsage from his home monitor, asked pt if the monitor was still plugged up, pt checked and monitor was connected to a power strip and it had flipped "off". Pt turned switch back on, and pt stated home monitor green ok light came on.

## 2016-07-02 ENCOUNTER — Encounter: Payer: Self-pay | Admitting: Cardiology

## 2016-07-02 NOTE — Progress Notes (Signed)
Letter  

## 2016-07-08 ENCOUNTER — Other Ambulatory Visit: Payer: Self-pay | Admitting: Primary Care

## 2016-07-08 DIAGNOSIS — M542 Cervicalgia: Principal | ICD-10-CM

## 2016-07-08 DIAGNOSIS — G8929 Other chronic pain: Secondary | ICD-10-CM

## 2016-07-14 ENCOUNTER — Telehealth: Payer: Self-pay | Admitting: Primary Care

## 2016-07-14 DIAGNOSIS — F330 Major depressive disorder, recurrent, mild: Secondary | ICD-10-CM | POA: Diagnosis not present

## 2016-07-14 DIAGNOSIS — F1020 Alcohol dependence, uncomplicated: Secondary | ICD-10-CM | POA: Diagnosis not present

## 2016-07-14 NOTE — Telephone Encounter (Signed)
Noted. Will review staff message.

## 2016-07-14 NOTE — Telephone Encounter (Signed)
Received a call from Dr Cephus Shelling who was seeing your patient today for the first time. She said she referred him to [facility] for Alcohol Detox b/c he is drinking 16 beers per day and she couldn't treat him until he stops drinking. She said she will send you an Epic message also.

## 2016-07-20 ENCOUNTER — Ambulatory Visit (INDEPENDENT_AMBULATORY_CARE_PROVIDER_SITE_OTHER): Payer: PPO | Admitting: Family Medicine

## 2016-07-20 ENCOUNTER — Encounter: Payer: Self-pay | Admitting: Family Medicine

## 2016-07-20 DIAGNOSIS — I10 Essential (primary) hypertension: Secondary | ICD-10-CM | POA: Diagnosis not present

## 2016-07-20 DIAGNOSIS — F1010 Alcohol abuse, uncomplicated: Secondary | ICD-10-CM

## 2016-07-20 DIAGNOSIS — R609 Edema, unspecified: Secondary | ICD-10-CM | POA: Diagnosis not present

## 2016-07-20 LAB — COMPREHENSIVE METABOLIC PANEL
ALK PHOS: 78 U/L (ref 39–117)
ALT: 18 U/L (ref 0–53)
AST: 16 U/L (ref 0–37)
Albumin: 4.2 g/dL (ref 3.5–5.2)
BUN: 8 mg/dL (ref 6–23)
CHLORIDE: 106 meq/L (ref 96–112)
CO2: 27 meq/L (ref 19–32)
Calcium: 9.6 mg/dL (ref 8.4–10.5)
Creatinine, Ser: 0.59 mg/dL (ref 0.40–1.50)
GFR: 148.5 mL/min (ref >60.00)
GLUCOSE: 78 mg/dL (ref 70–99)
POTASSIUM: 3.8 meq/L (ref 3.5–5.1)
SODIUM: 139 meq/L (ref 135–145)
Total Bilirubin: 0.4 mg/dL (ref 0.2–1.2)
Total Protein: 6.9 g/dL (ref 6.0–8.3)

## 2016-07-20 MED ORDER — AMLODIPINE BESYLATE 10 MG PO TABS: 5.0000 mg | ORAL_TABLET | Freq: Every day | ORAL | Status: DC

## 2016-07-20 NOTE — Progress Notes (Signed)
BLE edema.  Started in the last 2 weeks.  Episodic.  Better in the AM, worse as the day goes on, especially in the afternoon.  No CP.  Not SOB.  Pacer hx noted.  Not SOB when supine.    On amlodipine.  Prev was on HCTZ but off for months now, not an acute change.  No more salt in diet, that is consistent.    Etoh: 6-7 beers a day.  D/w pt about cutting back, but not quitting cold [place].  Prev abd pain improved after cutting out peanuts.    Meds, vitals, and allergies reviewed.   ROS: Per HPI unless specifically indicated in ROS section   GEN: nad, alert and oriented HEENT: mucous membranes moist NECK: supple w/o LA CV: rrr PULM: ctab, no inc wob ABD: soft, +bs EXT: Trace BLE edema.  SKIN: no acute rash

## 2016-07-20 NOTE — Patient Instructions (Signed)
Cut back to 5mg  amlodipine.  Go to the lab on the way out.  We'll contact you with your lab report. Update [place] in about 1 week.   Let me know about your BP and swelling next week.  Take care.  Glad to see you.

## 2016-07-21 DIAGNOSIS — R609 Edema, unspecified: Secondary | ICD-10-CM | POA: Insufficient documentation

## 2016-07-21 HISTORY — DX: Edema, unspecified: R60.9

## 2016-07-21 NOTE — Assessment & Plan Note (Signed)
Encouraged gradual cessation.

## 2016-07-21 NOTE — Assessment & Plan Note (Signed)
Could be due to amlodipine. He does not appear to be in overt failure. Discussed with patient. Check routine labs today. Check LFTs especially given his alcohol use. He will update [place] on lower dose of amlodipine in about 1 week. I would expect his blood pressure to go up some in the meantime, we can address this when we get update from patient. He agrees. Okay for outpatient follow-up.

## 2016-08-22 ENCOUNTER — Other Ambulatory Visit: Payer: Self-pay | Admitting: Primary Care

## 2016-08-22 DIAGNOSIS — G8929 Other chronic pain: Secondary | ICD-10-CM

## 2016-08-22 DIAGNOSIS — M542 Cervicalgia: Principal | ICD-10-CM

## 2016-10-13 ENCOUNTER — Encounter: Payer: Self-pay | Admitting: Primary Care

## 2016-10-13 ENCOUNTER — Ambulatory Visit (INDEPENDENT_AMBULATORY_CARE_PROVIDER_SITE_OTHER): Payer: PPO | Admitting: Primary Care

## 2016-10-13 VITALS — BP 120/74 | HR 81 | Temp 98.4°F | Ht 65.0 in | Wt 175.8 lb

## 2016-10-13 DIAGNOSIS — F1010 Alcohol abuse, uncomplicated: Secondary | ICD-10-CM | POA: Diagnosis not present

## 2016-10-13 DIAGNOSIS — F419 Anxiety disorder, unspecified: Secondary | ICD-10-CM

## 2016-10-13 DIAGNOSIS — F329 Major depressive disorder, single episode, unspecified: Secondary | ICD-10-CM | POA: Diagnosis not present

## 2016-10-13 DIAGNOSIS — F32A Depression, unspecified: Secondary | ICD-10-CM

## 2016-10-13 DIAGNOSIS — I10 Essential (primary) hypertension: Secondary | ICD-10-CM

## 2016-10-13 DIAGNOSIS — G8929 Other chronic pain: Secondary | ICD-10-CM

## 2016-10-13 DIAGNOSIS — E785 Hyperlipidemia, unspecified: Secondary | ICD-10-CM | POA: Diagnosis not present

## 2016-10-13 DIAGNOSIS — M542 Cervicalgia: Secondary | ICD-10-CM | POA: Diagnosis not present

## 2016-10-13 MED ORDER — AMLODIPINE BESYLATE 5 MG PO TABS: 5.0000 mg | ORAL_TABLET | Freq: Every day | ORAL | 0 refills | Status: DC

## 2016-10-13 MED ORDER — ATORVASTATIN CALCIUM 20 MG PO TABS: 20.0000 mg | ORAL_TABLET | Freq: Every evening | ORAL | 0 refills | Status: DC

## 2016-10-13 MED ORDER — GABAPENTIN 100 MG PO CAPS: 100.0000 mg | ORAL_CAPSULE | Freq: Every day | ORAL | 0 refills | Status: DC

## 2016-10-13 NOTE — Progress Notes (Signed)
Subjective:    Patient ID: Brandon Lopez, male    DOB: Jul 19, 1955, 61 y.o.   MRN: [id number]  HPI  Mr. Popp is a 61 year old male with a history of COPD, tobacco abuse, hypertension, OSA who presents today with a chief complaint of cough. He also reports nasal congestion, headache, fatigue, chills. His symptoms began three days ago. He's been taking an old prescription of Augmentin BID for the past 2 days. He's also taken Claritin. He's compliant to his Symbicort twice daily and is using his albuterol sparingly, more recently during his recent symptoms. His cough is productive with whitish sputum.     Review of Systems  Constitutional: Positive for chills and fatigue. Negative for fever.  HENT: Positive for congestion and sinus pressure. Negative for sore throat.   Respiratory: Positive for cough.   Musculoskeletal: Positive for arthralgias.       Past Medical History:  Diagnosis Date  . Arthritis   . CHF (congestive heart failure) ([place])   . COPD (chronic obstructive pulmonary disease) ([place])   . Depression   . Diverticulosis    Found during colonoscopy  . Hypertension   . Kidney stones   . 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 Presence of permanent cardiac pacemaker    Biotronik; Dayna Barker DR-T [id number]; placed 08/2012  . Sleep apnea      Social History   Social History  . Marital status: Single    Spouse name: N/A  . Number of children: N/A  . Years of education: N/A   Occupational History  . Not on file.   Social History Main Topics  . Smoking status: Heavy Tobacco Smoker    Packs/day: 2.00    Years: 40.00    Types: Cigarettes  . Smokeless tobacco: Never Used  . Alcohol use 7.2 oz/week    12 Cans of beer per week  . Drug use: Yes    Types: Marijuana  . Sexual activity: Not on file   Other Topics Concern  . Not on file   Social History Narrative  . No narrative on file    Past Surgical History:  Procedure Laterality Date  . APPENDECTOMY  age 28  .  CARDIAC CATHETERIZATION     7 years ago  . COLONOSCOPY N/A 05/27/2014   Procedure: COLONOSCOPY;  Surgeon: Hulen Luster, MD;  Location: [facility] ENDOSCOPY;  Service: Gastroenterology;  Laterality: N/A;  . INSERT / REPLACE / REMOVE PACEMAKER     3 years ago    Family History  Problem Relation Age of Onset  . Arthritis Mother   . Heart disease Father   . Hyperlipidemia Father   . Hypertension Father   . Cancer Paternal Grandfather     Allergies  Allergen Reactions  . Simvastatin Diarrhea    Current Outpatient Prescriptions on File Prior to Visit  Medication Sig Dispense Refill  . albuterol (PROVENTIL HFA;VENTOLIN HFA) 108 (90 BASE) MCG/ACT inhaler Inhale 2 puffs into the lungs every 6 (six) hours as needed.    Marland Kitchen aspirin 81 MG tablet Take 81 mg by mouth daily.    Marland Kitchen loratadine (CLARITIN) 10 MG tablet Take 20 mg by mouth daily.    . SYMBICORT 160-4.5 MCG/ACT inhaler INHALE TWO PUFFS BY MOUTH TWICE DAILY 3 Inhaler 3   No current facility-administered medications on file prior to visit.     BP 120/74   Pulse 81   Temp 98.4 F (36.9 C) (Oral)   Ht 5\' 5"  (1.651 m)  Wt 175 lb 12.8 oz (79.7 kg)   SpO2 96%   BMI 29.25 kg/m    Objective:   Physical Exam  Constitutional: He appears well-nourished. He does not appear ill.  HENT:  Right Ear: Tympanic membrane and ear canal normal.  Left Ear: Tympanic membrane and ear canal normal.  Nose: No mucosal edema. Right sinus exhibits no maxillary sinus tenderness and no frontal sinus tenderness. Left sinus exhibits no maxillary sinus tenderness and no frontal sinus tenderness.  Mouth/Throat: Oropharynx is clear and moist.  Eyes: Conjunctivae are normal.  Neck: Neck supple.  Cardiovascular: Normal rate and regular rhythm.   Pulmonary/Chest: Effort normal. He has no wheezes. He has rhonchi in the left upper field. He has no rales.  Skin: Skin is warm and dry.          Assessment & Plan:  URI:  Cough, congestion, mild shortness of breath  x 3 days. Exam today overall stable, mild rhonchi to, no rales/wheezing. Vitals stable, does not appear sickly. Suspect viral involvement. Stop Augmentin. Start Mucinex DM, continue Claritin. Discussed fluids, rest, and return precautions.  Sheral Flow, NP

## 2016-10-13 NOTE — Patient Instructions (Signed)
Your symptoms are representative of a viral illness which will resolve on its own over time. Our goal is to treat your symptoms in order to aid your body in the healing process and to make you more comfortable.   Continue Claritin.  Cough/Congestion: Try taking Mucinex DM. This will help loosen up the mucous in your chest. Ensure you take this medication with a full glass of water.  Stop the antibiotics.  Please notify me if you develop persistent fevers of 101, start coughing up green mucous, notice increased fatigue or weakness, or feel worse after 1 week of onset of symptoms.   Increase consumption of water intake and rest.  Please schedule a physical with me within the next 1-3 months. You may also schedule a lab only appointment 3-4 days prior. We will discuss your lab results in detail during your physical.  It was a pleasure to see you today!

## 2016-10-13 NOTE — Assessment & Plan Note (Signed)
Endorses that he's cut down significantly, has lost weight since this change. Commended him on these efforts.

## 2016-10-13 NOTE — Assessment & Plan Note (Signed)
Weaned himself off of Zoloft about one month ago, doing very well without the medication. He's feeling more energized and more motivation. He is also cutting back on his alcohol consumption. Continue off of Zoloft for now, continue to monitor.

## 2016-10-15 ENCOUNTER — Telehealth: Payer: Self-pay | Admitting: Primary Care

## 2016-10-15 NOTE — Telephone Encounter (Signed)
He has a four day history of these symptoms, exam two days ago was stable. Would recommend he use the albuterol inhaler as needed for shortness of breath, tylenol or Ibuprofen for chills/body aches. If he's feeling worse on Monday then would recommend chest xray. Please have him update [place] on Monday.  Often times with viral illnesses you feel worse before you feel better. He should be feeling better by early next week.

## 2016-10-15 NOTE — Telephone Encounter (Signed)
What is his most bothersome symptom? Any fevers? Any shortness of breath or wheezing?

## 2016-10-15 NOTE — Telephone Encounter (Signed)
PT called to report he is not feeling any better since 9/19 OV. He has been taking claritin, DM cough syrup but still isn't getting relief. He would like a cb advising what he can do different.

## 2016-10-15 NOTE — Telephone Encounter (Signed)
Spoken and notified patient of Kate's comments. Most bothersome is the fatigue. Patient stated that he feels feverish at times and some SOB.

## 2016-10-18 NOTE — Telephone Encounter (Signed)
Spoken to patient earlier today. Patient stated that he is a little better. He stated that he will let us know later in the week if he does not feel a little better.

## 2016-10-18 NOTE — Telephone Encounter (Signed)
Message left for patient to return my call.  

## 2016-10-18 NOTE — Telephone Encounter (Signed)
Noted  

## 2016-10-18 NOTE — Telephone Encounter (Signed)
Caller Name:Geovanni Butrick Relationship to Patient:slef Best number:[phone] Pharmacy:  Reason for call:  Returning your call

## 2016-11-23 ENCOUNTER — Other Ambulatory Visit: Payer: Self-pay | Admitting: Primary Care

## 2016-11-23 ENCOUNTER — Ambulatory Visit: Payer: Self-pay

## 2016-11-23 NOTE — Telephone Encounter (Signed)
Pt. Called requesting refill on Albuterol inhaler. Pharmacy had already sent request to the practice. Instructed pt. To check with pharmacy on the turn around time. Pt. States he is not completely out of inhaler.

## 2016-11-23 NOTE — Telephone Encounter (Signed)
Per phone note on 10/15/16 Gentry Fitz NP said to continue albuterol inhaler. Refill per protocol.

## 2016-11-25 ENCOUNTER — Telehealth: Payer: Self-pay | Admitting: Primary Care

## 2016-11-25 MED ORDER — ALBUTEROL SULFATE HFA 108 (90 BASE) MCG/ACT IN AERS: INHALATION_SPRAY | RESPIRATORY_TRACT | 1 refills | Status: DC

## 2016-11-25 MED ORDER — ALBUTEROL SULFATE HFA 108 (90 BASE) MCG/ACT IN AERS: 2.0000 | INHALATION_SPRAY | Freq: Four times a day (QID) | RESPIRATORY_TRACT | 2 refills | Status: DC | PRN

## 2016-11-25 NOTE — Telephone Encounter (Signed)
Do they mean Proair?

## 2016-11-25 NOTE — Telephone Encounter (Signed)
Copied from [place] [id number]. Topic: Quick Communication - See Telephone Encounter >> Nov 25, 2016  2:59 PM Conception Chancy, NT wrote: CRM for notification. See Telephone encounter for:  11/25/16.  Pt insurance is not covering the inhaler the doctor is sending in. pharmacy told him the insurance would cover Prolin.

## 2016-11-25 NOTE — Telephone Encounter (Signed)
Copied from [place] #[id number]. Topic: Quick Communication - See Telephone Encounter >> Nov 25, 2016  8:12 AM Ether Griffins B wrote: CRM for notification. See Telephone encounter for:  11/25/16 Pt needs albuterol refilled contacted [place] yesterday. Pt is completely out.

## 2016-11-25 NOTE — Telephone Encounter (Signed)
Pt contacted pharmacy for Albuterol refill but they told him he needed a Rx from the doctor.  He is completely out.

## 2016-11-25 NOTE — Telephone Encounter (Signed)
Spoken to patient and he was notified that albuterol inhaler refill has been sent to pharmacy.

## 2016-11-25 NOTE — Addendum Note (Signed)
Addended by: Jacqualin Combes on: 11/25/2016 03:55 PM   Modules accepted: Orders

## 2016-12-23 ENCOUNTER — Ambulatory Visit: Payer: PPO | Admitting: Primary Care

## 2016-12-23 ENCOUNTER — Encounter: Payer: Self-pay | Admitting: Primary Care

## 2016-12-23 VITALS — BP 140/80 | HR 78 | Temp 98.2°F | Ht 65.0 in | Wt 174.0 lb

## 2016-12-23 DIAGNOSIS — J449 Chronic obstructive pulmonary disease, unspecified: Secondary | ICD-10-CM | POA: Diagnosis not present

## 2016-12-23 DIAGNOSIS — F329 Major depressive disorder, single episode, unspecified: Secondary | ICD-10-CM

## 2016-12-23 DIAGNOSIS — E785 Hyperlipidemia, unspecified: Secondary | ICD-10-CM

## 2016-12-23 DIAGNOSIS — I10 Essential (primary) hypertension: Secondary | ICD-10-CM

## 2016-12-23 DIAGNOSIS — F32A Depression, unspecified: Secondary | ICD-10-CM

## 2016-12-23 DIAGNOSIS — Z23 Encounter for immunization: Secondary | ICD-10-CM | POA: Diagnosis not present

## 2016-12-23 DIAGNOSIS — F1010 Alcohol abuse, uncomplicated: Secondary | ICD-10-CM | POA: Diagnosis not present

## 2016-12-23 DIAGNOSIS — Z720 Tobacco use: Secondary | ICD-10-CM

## 2016-12-23 DIAGNOSIS — F419 Anxiety disorder, unspecified: Secondary | ICD-10-CM

## 2016-12-23 DIAGNOSIS — Z950 Presence of cardiac pacemaker: Secondary | ICD-10-CM

## 2016-12-23 DIAGNOSIS — Z0000 Encounter for general adult medical examination without abnormal findings: Secondary | ICD-10-CM

## 2016-12-23 DIAGNOSIS — M542 Cervicalgia: Secondary | ICD-10-CM | POA: Diagnosis not present

## 2016-12-23 LAB — COMPREHENSIVE METABOLIC PANEL
ALT: 20 U/L (ref 0–53)
AST: 17 U/L (ref 0–37)
Albumin: 4.3 g/dL (ref 3.5–5.2)
Alkaline Phosphatase: 83 U/L (ref 39–117)
BILIRUBIN TOTAL: 0.7 mg/dL (ref 0.2–1.2)
BUN: 8 mg/dL (ref 6–23)
CALCIUM: 9.4 mg/dL (ref 8.4–10.5)
CHLORIDE: 103 meq/L (ref 96–112)
CO2: 27 meq/L (ref 19–32)
Creatinine, Ser: 0.65 mg/dL (ref 0.40–1.50)
GFR: 132.61 mL/min (ref >60.00)
Glucose, Bld: 95 mg/dL (ref 70–99)
Potassium: 3.8 mEq/L (ref 3.5–5.1)
Sodium: 137 mEq/L (ref 135–145)
Total Protein: 7.5 g/dL (ref 6.0–8.3)

## 2016-12-23 LAB — LIPID PANEL
CHOL/HDL RATIO: 4
CHOLESTEROL: 148 mg/dL (ref 0–200)
HDL: 42 mg/dL (ref >39.00)
LDL CALC: 89 mg/dL (ref 0–99)
NonHDL: 105.69
TRIGLYCERIDES: 83 mg/dL (ref 0.0–149.0)
VLDL: 16.6 mg/dL (ref 0.0–40.0)

## 2016-12-23 NOTE — Addendum Note (Signed)
Addended by: Jacqualin Combes on: 12/23/2016 01:00 PM   Modules accepted: Orders

## 2016-12-23 NOTE — Assessment & Plan Note (Signed)
Much improved since off all meds. He is feeling more motivated to get outside and in public. Continue to monitor.

## 2016-12-23 NOTE — Assessment & Plan Note (Signed)
Lipid panel pending. Continue statin and aspirin.

## 2016-12-23 NOTE — Patient Instructions (Addendum)
Complete lab work prior to leaving today. I will notify you of your results once received.   Stop smoking.  Follow up with Cardiology for pacemaker check.  Start exercising. You should be getting 150 minutes of moderate intensity exercise weekly.  Increase consumption of vegetables, fruit, whole grains.  Follow up in 1 year for your annual exam or sooner if needed.  It was a pleasure to see you today!

## 2016-12-23 NOTE — Assessment & Plan Note (Signed)
Strongly advised he follow up with his cardiologist as he's not been in in 2018. No recent pacemaker check.

## 2016-12-23 NOTE — Progress Notes (Signed)
Subjective:    Patient ID: Brandon Lopez, male    DOB: [birth year], 61 y.o.   MRN: [id number]  HPI  Brandon Lopez is a 61 year old male who presents today for complete physical.  Immunizations: -Tetanus: Completed in 2013 -Influenza: Due -Pneumonia: Completed in 2016 -Shingles: Completed in 2017  Diet: He endorses a fair diet. Breakfast: Berniece Salines, egg Lunch: Skips sometimes, peanut butter sandwich Dinner: Fajita, burger with home made french fries, some vegetables.  Snacks: peanut butter sandwich, chocolate, cookie Desserts: Daily Beverages: Beer (6-8 twelve ounce cans daily), coffee, soda, water  Exercise: He does not exercise. Is active several days weekly.  Eye exam: Completed in 2017 Dental exam: No recent visit Colonoscopy: Completed in 2016, due in 2026 PSA: Completed in 2016   Review of Systems  Constitutional: Negative for unexpected weight change.  HENT: Negative for rhinorrhea.   Respiratory: Negative for cough and shortness of breath.   Cardiovascular: Negative for chest pain.  Gastrointestinal: Negative for constipation and diarrhea.  Genitourinary: Negative for difficulty urinating.  Musculoskeletal: Negative for arthralgias and myalgias.  Skin: Negative for rash.  Allergic/Immunologic: Negative for environmental allergies.  Neurological: Negative for dizziness, numbness and headaches.  Psychiatric/Behavioral:       Overall feeling much less depressed, doing well of meds.        Past Medical History:  Diagnosis Date  . Arthritis   . CHF (congestive heart failure) ([place])   . COPD (chronic obstructive pulmonary disease) ([place])   . Depression   . Diverticulosis    Found during colonoscopy  . Hypertension   . Kidney stones   . 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 Presence of permanent cardiac pacemaker    Biotronik; Dayna Barker DR-T [id number]; placed 08/2012  . Sleep apnea      Social History   Socioeconomic History  . Marital status: Single    Spouse name:  Not on file  . Number of children: Not on file  . Years of education: Not on file  . Highest education level: Not on file  Social Needs  . Financial resource strain: Not on file  . Food insecurity - worry: Not on file  . Food insecurity - inability: Not on file  . Transportation needs - medical: Not on file  . Transportation needs - non-medical: Not on file  Occupational History  . Not on file  Tobacco Use  . Smoking status: Heavy Tobacco Smoker    Packs/day: 2.00    Years: 40.00    Pack years: 80.00    Types: Cigarettes  . Smokeless tobacco: Never Used  Substance and Sexual Activity  . Alcohol use: Yes    Alcohol/week: 7.2 oz    Types: 12 Cans of beer per week  . Drug use: Yes    Types: Marijuana  . Sexual activity: Not on file  Other Topics Concern  . Not on file  Social History Narrative  . Not on file    Past Surgical History:  Procedure Laterality Date  . APPENDECTOMY  age 76  . CARDIAC CATHETERIZATION     7 years ago  . COLONOSCOPY N/A 05/27/2014   Procedure: COLONOSCOPY;  Surgeon: Hulen Luster, MD;  Location: [facility] ENDOSCOPY;  Service: Gastroenterology;  Laterality: N/A;  . INSERT / REPLACE / REMOVE PACEMAKER     3 years ago    Family History  Problem Relation Age of Onset  . Arthritis Mother   . Heart disease Father   . Hyperlipidemia Father   .  Hypertension Father   . Cancer Paternal Grandfather     Allergies  Allergen Reactions  . Simvastatin Diarrhea    Current Outpatient Medications on File Prior to Visit  Medication Sig Dispense Refill  . albuterol (PROAIR HFA) 108 (90 Base) MCG/ACT inhaler Inhale 2 puffs into the lungs every 6 (six) hours as needed for wheezing or shortness of breath. 18 g 2  . amLODipine (NORVASC) 5 MG tablet Take 1 tablet (5 mg total) by mouth daily. 90 tablet 0  . aspirin 81 MG tablet Take 81 mg by mouth daily.    Marland Kitchen atorvastatin (LIPITOR) 20 MG tablet Take 1 tablet (20 mg total) by mouth every evening. 90 tablet 0  .  gabapentin (NEURONTIN) 100 MG capsule Take 1 capsule (100 mg total) by mouth at bedtime. 90 capsule 0  . loratadine (CLARITIN) 10 MG tablet Take 20 mg by mouth daily.    . SYMBICORT 160-4.5 MCG/ACT inhaler INHALE TWO PUFFS BY MOUTH TWICE DAILY 3 Inhaler 3   No current facility-administered medications on file prior to visit.     BP 140/80   Pulse 78   Temp 98.2 F (36.8 C) (Oral)   Ht 5\' 5"  (1.651 m)   Wt 174 lb (78.9 kg)   SpO2 98%   BMI 28.96 kg/m    Objective:   Physical Exam  Constitutional: He is oriented to person, place, and time. He appears well-nourished.  HENT:  Right Ear: Tympanic membrane and ear canal normal.  Left Ear: Tympanic membrane and ear canal normal.  Nose: Nose normal. Right sinus exhibits no maxillary sinus tenderness and no frontal sinus tenderness. Left sinus exhibits no maxillary sinus tenderness and no frontal sinus tenderness.  Mouth/Throat: Oropharynx is clear and moist.  Eyes: Conjunctivae and EOM are normal. Pupils are equal, round, and reactive to light.  Neck: Neck supple. Carotid bruit is not present. No thyromegaly present.  Cardiovascular: Normal rate, regular rhythm and normal heart sounds.  Pulmonary/Chest: Effort normal and breath sounds normal. He has no wheezes. He has no rales.  Abdominal: Soft. Bowel sounds are normal. There is no tenderness.  Musculoskeletal: Normal range of motion.  Neurological: He is alert and oriented to person, place, and time. He has normal reflexes. No cranial nerve deficit.  Skin: Skin is warm and dry.  Psychiatric: He has a normal mood and affect.          Assessment & Plan:

## 2016-12-23 NOTE — Assessment & Plan Note (Signed)
TD, shingles and pneumonia UTD, influenza vaccination provided today. PSA UTD. Colonoscopy UTD. Recommended regular exercise and an improvement in diet. Discussed this in detail. Exam unremarkable. Labs pending. Follow up in 1 year.

## 2016-12-23 NOTE — Assessment & Plan Note (Signed)
Strongly advised he quit smoking.

## 2016-12-23 NOTE — Assessment & Plan Note (Signed)
Compliant to Symbicort, infrequent use of albuterol. Discussed to stop smoking.

## 2016-12-23 NOTE — Assessment & Plan Note (Signed)
Down to 6-8 twelve ounce beers daily, commended him on this, encouraged to quit.

## 2016-12-23 NOTE — Assessment & Plan Note (Signed)
Improved and using gabapentin PRN. Continue same.

## 2016-12-23 NOTE — Assessment & Plan Note (Signed)
Borderline. Continue Amlodipine, will have him start monitoring home BP.

## 2016-12-24 ENCOUNTER — Encounter: Payer: Self-pay | Admitting: *Deleted

## 2017-01-11 ENCOUNTER — Telehealth: Payer: Self-pay | Admitting: Primary Care

## 2017-01-11 ENCOUNTER — Other Ambulatory Visit: Payer: Self-pay | Admitting: Primary Care

## 2017-01-11 DIAGNOSIS — J449 Chronic obstructive pulmonary disease, unspecified: Secondary | ICD-10-CM

## 2017-01-11 NOTE — Telephone Encounter (Signed)
Copied from [place] [phone] [id number]. Topic: Quick Communication - Rx Refill/Question >> Jan 11, 2017 11:53 AM Patrice Paradise wrote: Has the patient contacted their pharmacy? Yes.   SYMBICORT 160-4.5 MCG/ACT inhaler (PATIENT WILL BE OUT SOON)  (Agent: If no, request that the patient contact the pharmacy for the refill.)  Preferred Pharmacy (with phone number or street name):  [facility] [place] [facility] [address] [place] [address] Phone: [phone] Fax: [phone]   Agent: Please be advised that RX refills may take up to 3 business days. We ask that you follow-up with your pharmacy.

## 2017-01-11 NOTE — Telephone Encounter (Signed)
Copied from [place] [phone]. Topic: Quick Communication - Rx Refill/Question >> Jan 11, 2017 11:53 AM Patrice Paradise wrote: Has the patient contacted their pharmacy? Yes.   SYMBICORT 160-4.5 MCG/ACT inhaler (PATIENT WILL BE OUT SOON)  (Agent: If no, request that the patient contact the pharmacy for the refill.)  Preferred Pharmacy (with phone number or street name):   Agent: Please be advised that RX refills may take up to 3 business days. We ask that you follow-up with your pharmacy.

## 2017-01-11 NOTE — Telephone Encounter (Signed)
Medication has been filled by office

## 2017-01-21 ENCOUNTER — Ambulatory Visit: Payer: Self-pay

## 2017-01-27 ENCOUNTER — Other Ambulatory Visit: Payer: Self-pay | Admitting: Primary Care

## 2017-01-27 DIAGNOSIS — E785 Hyperlipidemia, unspecified: Secondary | ICD-10-CM

## 2017-02-09 ENCOUNTER — Other Ambulatory Visit: Payer: Self-pay | Admitting: Primary Care

## 2017-02-09 DIAGNOSIS — I10 Essential (primary) hypertension: Secondary | ICD-10-CM

## 2017-02-11 NOTE — Progress Notes (Deleted)
* [facility]     Assessment and Plan:  Severe OSA with excessive daytime sleepiness.  -AHI of 93, CPAP titrated to 18, which was not adequate did not achieve REM sleep at that setting. Therefore, the patient underwent a BiPAP titration study on 10/30/2012 and was titrated to a BiPAP setting of 22/14, which appeared adequate.   Central sleep apnea.  -Possibly treatment emergent sleep apnea. Doubt that this is primary.   COPD/Emphysema.  -Continue symbicort.   Nicotine Abuse.  -I discussed with him the importance of smoking cessation today and his disease would continue to progress if he continues to smoke.  Flu shot: received this season.  Pneumovax: 2016  Date: 02/11/2017  MRN# [id number] [place] 07/11/1955   Brandon Lopez is a 62 y.o. old male seen in follow up for chief complaint of  No chief complaint on file.    HPI:  Patient is a 62 year old male past medical history of hyperlipidemia, cervical neck pain, COPD, essential hypertension, obstructive sleep apnea which is quite severe.  He has been on BiPAP setting of 22/14, recently he found that the pressure was too high therefore I asked that he be changed to auto-bipap; Low=7, High=18; Pressure support=5.   He is also a smoker with dyspnea and has been started on symbicort for COPD.  He is smoking about 2 ppd. He is on symbicort 2 puffs twice daily.   Per review of old notes, the patient had a split-night sleep study on 10/16/2012 at [facility].  this showed initial apnea hypopnea index of 93. The patient was started on CPAP and titrated to 7 cm of water with a residual AHI of 7.5. Subsequently, the patient had a CPAP titration study on  10/14/2012 with CPAP titrated up to a pressure of 18. The patient still had snoring and RERAs present at that level. He did not achieve REM sleep at that setting. Therefore, the patient underwent a BiPAP titration study on 10/30/2012 and was titrated to a  BiPAP setting of 22/14 with an AHI of 21.6 due to central apneas. He did achieve REM sleep at that setting. Per his last note from [facility] on 11/06/2012 lowering to  20/12 cm H2O could be considered.   Review compliance report from 10/10-11/8/17:90% compliance, average usage on days used his 8 hour 17 minutes, CPAP is set at 10, with an EPR 3. Minimal leaks. Residual AHI is 8.7  CXR 10/14/14: changes of chronic bronchitis.   No flowsheet data found.  Pulmonary Functions Testing Results:  No results found for: FEV1, FVC, FEV1FVC, TLC, DLCO   Medication:   Outpatient Encounter Medications as of 02/14/2017  Medication Sig  . albuterol (PROAIR HFA) 108 (90 Base) MCG/ACT inhaler Inhale 2 puffs into the lungs every 6 (six) hours as needed for wheezing or shortness of breath.  Marland Kitchen amLODipine (NORVASC) 5 MG tablet TAKE 1 TABLET BY MOUTH ONCE DAILY  . aspirin 81 MG tablet Take 81 mg by mouth daily.  Marland Kitchen atorvastatin (LIPITOR) 20 MG tablet TAKE 1 TABLET BY MOUTH IN THE EVENING  . gabapentin (NEURONTIN) 100 MG capsule Take 1 capsule (100 mg total) by mouth at bedtime.  Marland Kitchen loratadine (CLARITIN) 10 MG tablet Take 20 mg by mouth daily.  . SYMBICORT 160-4.5 MCG/ACT inhaler INHALE TWO PUFFS BY MOUTH TWICE DAILY   No facility-administered encounter medications on file as of 02/14/2017.      Allergies:  Simvastatin  Review of Systems: Gen:  Denies  fever,  sweats. HEENT: Denies blurred vision. Cvc:  No dizziness, chest pain or heaviness Resp:   Denies sputum porduction. Has constant cough.  Gi: Denies swallowing difficulty, Gu:  Denies bladder incontinence, burning urine Ext:   No Joint pain, stiffness. Skin: No skin rash, easy bruising. Endoc:  No polyuria, polydipsia. Psych: No depression, insomnia. Other:  All other systems were reviewed and found to be negative other than what is mentioned in the HPI.   Physical Examination:   VS: There were no vitals taken for this visit.  General Appearance: No  distress  Neuro:without focal findings,  speech normal,  HEENT: PERRLA, EOM intact. Pulmonary: normal breath sounds, No wheezing.   CardiovascularNormal S1,S2.  No m/r/g.   Abdomen: Benign, Soft, non-tender. Renal:  No costovertebral tenderness  GU:  Not performed at this time. Endoc: No evident thyromegaly, no signs of acromegaly. Skin:   warm, no rash. Extremities: normal, no cyanosis, clubbing.   LABORATORY PANEL:   CBC No results for input(s): WBC, HGB, HCT, PLT in the last 168 hours. ------------------------------------------------------------------------------------------------------------------  Chemistries  No results for input(s): NA, K, CL, CO2, GLUCOSE, BUN, CREATININE, CALCIUM, MG, AST, ALT, ALKPHOS, BILITOT in the last 168 hours.  Invalid input(s): GFRCGP ------------------------------------------------------------------------------------------------------------------  Cardiac Enzymes No results for input(s): TROPONINI in the last 168 hours. ------------------------------------------------------------  RADIOLOGY:   No results found for this or any previous visit. Results for orders placed in visit on 10/14/14  DG Chest 2 View   Narrative CLINICAL DATA:  Chronic progressive cough for 1 month.  EXAM: CHEST  2 VIEW  COMPARISON:  02/07/2014  FINDINGS: Heart size and pulmonary vascularity are normal. Pacemaker in place. Slight peribronchial thickening which can be seen with bronchitis. No consolidative infiltrates or effusions. No osseous abnormality.  IMPRESSION: Bronchitic changes.   Electronically Signed   By: Lorriane Shire M.D.   On: 10/14/2014 16:54    ------------------------------------------------------------------------------------------------------------------  Thank  you for allowing [facility] to assist in the care of your patient. Our recommendations are noted above.  Please contact us if we can be of further  service.   Marda Stalker, MD.  [facility] Office Number: [phone]  Patricia Pesa, M.D.  [place] Border, M.D

## 2017-02-14 ENCOUNTER — Ambulatory Visit: Payer: Self-pay | Admitting: Internal Medicine

## 2017-03-17 ENCOUNTER — Encounter: Payer: Self-pay | Admitting: Primary Care

## 2017-03-17 ENCOUNTER — Ambulatory Visit (INDEPENDENT_AMBULATORY_CARE_PROVIDER_SITE_OTHER): Payer: PPO | Admitting: Primary Care

## 2017-03-17 DIAGNOSIS — I10 Essential (primary) hypertension: Secondary | ICD-10-CM | POA: Diagnosis not present

## 2017-03-17 DIAGNOSIS — F1010 Alcohol abuse, uncomplicated: Secondary | ICD-10-CM | POA: Diagnosis not present

## 2017-03-17 DIAGNOSIS — M150 Primary generalized (osteo)arthritis: Secondary | ICD-10-CM

## 2017-03-17 DIAGNOSIS — G8929 Other chronic pain: Secondary | ICD-10-CM | POA: Diagnosis not present

## 2017-03-17 DIAGNOSIS — M1990 Unspecified osteoarthritis, unspecified site: Secondary | ICD-10-CM | POA: Insufficient documentation

## 2017-03-17 DIAGNOSIS — M542 Cervicalgia: Secondary | ICD-10-CM

## 2017-03-17 DIAGNOSIS — M159 Polyosteoarthritis, unspecified: Secondary | ICD-10-CM

## 2017-03-17 MED ORDER — GABAPENTIN 100 MG PO CAPS: ORAL_CAPSULE | ORAL | 0 refills | Status: DC

## 2017-03-17 NOTE — Assessment & Plan Note (Addendum)
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   Will increase gabapentin to 200 mg in the morning and evening. Reduce aspirin to 81 mg in the morning, tylenol PRN throughout the day. He declines physical therapy. Home stretching exercises provided, he will start exercising at the gym. He will see sports medicine if no improvement.   Consider changing atorvastatin to Crestor if continued symptoms, he will be seeing his cardiologist soon. Needs statin therapy given cardiac history.

## 2017-03-17 NOTE — Progress Notes (Signed)
Subjective:    Patient ID: Brandon Lopez, male    DOB: [birth year], 62 y.o.   MRN: [id number]  HPI  Brandon Lopez is a 62 year old male with a history of chronic neck pain who presents today with a chief complaint of shoulder and hand pain.  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 When he's done panning for gold his hands will ache. He's also been working on home projects and will notice bilateral shoulder and hand pain after working on projects. He's always worked in [employer], most of this life.   He denies injury/trauma. His pain has increased over the past three months, starting to affect his sleep. He's experiencing numbness/tingling to his left upper extremity which will travel to his elbow.   He's increased his frequency of gabapentin lately. He'll take 100 mg of gabapentin and 650 mg of aspirin every morning, he'll then take 100 mg of gabapentin in the evening, lately this isn't helping much. He's not tried taking Tylenol. He's drinking 8-12 cans of 12 ounce cans of beer daily.   Review of Systems  Musculoskeletal: Positive for arthralgias.       Bilateral chronic shoulder and hand pain  Skin: Negative for color change.  Neurological: Positive for numbness.       Past Medical History:  Diagnosis Date  . Arthritis   . CHF (congestive heart failure) ([place])   . COPD (chronic obstructive pulmonary disease) ([place])   . Depression   . Diverticulosis    Found during colonoscopy  . Hypertension   . Kidney stones   . 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 Presence of permanent cardiac pacemaker    Biotronik; Dayna Barker DR-T [id number]; placed 08/2012  . Sleep apnea      Social History   Socioeconomic History  . Marital status: Single    Spouse name: Not on file  . Number of children: Not on file  . Years of education: Not on file  . Highest education level: Not on file  Social Needs  . Financial resource strain: Not  on file  . Food insecurity - worry: Not on file  . Food insecurity - inability: Not on file  . Transportation needs - medical: Not on file  . Transportation needs - non-medical: Not on file  Occupational History  . Not on file  Tobacco Use  . Smoking status: Heavy Tobacco Smoker    Packs/day: 2.00    Years: 40.00    Pack years: 80.00    Types: Cigarettes  . Smokeless tobacco: Never Used  Substance and Sexual Activity  . Alcohol use: Yes    Alcohol/week: 7.2 oz    Types: 12 Cans of beer per week  . Drug use: Yes    Types: Marijuana  . Sexual activity: Not on file  Other Topics Concern  . Not on file  Social History Narrative  . Not on file    Past Surgical History:  Procedure Laterality Date  . APPENDECTOMY  age 20  . CARDIAC CATHETERIZATION     7 years ago  . COLONOSCOPY N/A 05/27/2014   Procedure: COLONOSCOPY;  Surgeon: Hulen Luster, MD;  Location: [facility] ENDOSCOPY;  Service: Gastroenterology;  Laterality: N/A;  . INSERT / REPLACE / REMOVE PACEMAKER     3 years ago    Family History  Problem Relation Age of Onset  . Arthritis Mother   .  Heart disease Father   . Hyperlipidemia Father   . Hypertension Father   . Cancer Paternal Grandfather     Allergies  Allergen Reactions  . Simvastatin Diarrhea    Current Outpatient Medications on File Prior to Visit  Medication Sig Dispense Refill  . albuterol (PROAIR HFA) 108 (90 Base) MCG/ACT inhaler Inhale 2 puffs into the lungs every 6 (six) hours as needed for wheezing or shortness of breath. 18 g 2  . amLODipine (NORVASC) 5 MG tablet TAKE 1 TABLET BY MOUTH ONCE DAILY 90 tablet 1  . aspirin 81 MG tablet Take 81 mg by mouth daily.    Marland Kitchen atorvastatin (LIPITOR) 20 MG tablet TAKE 1 TABLET BY MOUTH IN THE EVENING 90 tablet 1  . gabapentin (NEURONTIN) 100 MG capsule Take 1 capsule (100 mg total) by mouth at bedtime. 90 capsule 0  . loratadine (CLARITIN) 10 MG tablet Take 20 mg by mouth daily.    . SYMBICORT 160-4.5 MCG/ACT inhaler  INHALE TWO PUFFS BY MOUTH TWICE DAILY 10.2 g 3   No current facility-administered medications on file prior to visit.     BP (!) 146/92   Pulse 82   Temp 98.2 F (36.8 C) (Oral)   Ht 5\' 5"  (1.651 m)   Wt 176 lb 12 oz (80.2 kg)   SpO2 96%   BMI 29.41 kg/m    Objective:   Physical Exam  Constitutional: He appears well-nourished.  Neck: Neck supple.  Cardiovascular: Normal rate and regular rhythm.  Pulmonary/Chest: Effort normal and breath sounds normal. He has no wheezes. He has no rales.  Musculoskeletal:       Right shoulder: He exhibits decreased range of motion and pain. He exhibits no tenderness.       Left shoulder: He exhibits decreased range of motion and pain. He exhibits no tenderness.  Mild decrease in ROM with most planes of abduction. 5/5 strength to bilateral upper extremities.   Skin: Skin is warm and dry.          Assessment & Plan:

## 2017-03-17 NOTE — Patient Instructions (Addendum)
We've increased your gabapentin to 200 mg in the morning and 200 mg in the evening.   Try the stretching exercises as discussed.   Take aspirin 81 mg once daily, use Tylenol as needed for pain during the day. Do not exceed 3000 mg in 24 hours.  Monitor your blood pressure and notify me if you get readings at or above 135/90.  Please notify me if your aches continue.  It was a pleasure to see you today!   Shoulder Exercises Ask your health care provider which exercises are safe for you. Do exercises exactly as told by your health care provider and adjust them as directed. It is normal to feel mild stretching, pulling, tightness, or discomfort as you do these exercises, but you should stop right away if you feel sudden pain or your pain gets worse.Do not begin these exercises until told by your health care provider. RANGE OF MOTION EXERCISES These exercises warm up your muscles and joints and improve the movement and flexibility of your shoulder. These exercises also help to relieve pain, numbness, and tingling. These exercises involve stretching your injured shoulder directly. Exercise A: Pendulum  1. Stand near a wall or a surface that you can hold onto for balance. 2. Bend at the waist and let your left / right arm hang straight down. Use your other arm to support you. Keep your back straight and do not lock your knees. 3. Relax your left / right arm and shoulder muscles, and move your hips and your trunk so your left / right arm swings freely. Your arm should swing because of the motion of your body, not because you are using your arm or shoulder muscles. 4. Keep moving your body so your arm swings in the following directions, as told by your health care provider: ? Side to side. ? Forward and backward. ? In clockwise and counterclockwise circles. 5. Continue each motion for __________ seconds, or for as long as told by your health care provider. 6. Slowly return to the starting  position. Repeat __________ times. Complete this exercise __________ times a day. Exercise B:Flexion, Standing  1. Stand and hold a broomstick, a cane, or a similar object. Place your hands a little more than shoulder-width apart on the object. Your left / right hand should be palm-up, and your other hand should be palm-down. 2. Keep your elbow straight and keep your shoulder muscles relaxed. Push the stick down with your healthy arm to raise your left / right arm in front of your body, and then over your head until you feel a stretch in your shoulder. ? Avoid shrugging your shoulder while you raise your arm. Keep your shoulder blade tucked down toward the middle of your back. 3. Hold for __________ seconds. 4. Slowly return to the starting position. Repeat __________ times. Complete this exercise __________ times a day. Exercise C: Abduction, Standing 1. Stand and hold a broomstick, a cane, or a similar object. Place your hands a little more than shoulder-width apart on the object. Your left / right hand should be palm-up, and your other hand should be palm-down. 2. While keeping your elbow straight and your shoulder muscles relaxed, push the stick across your body toward your left / right side. Raise your left / right arm to the side of your body and then over your head until you feel a stretch in your shoulder. ? Do not raise your arm above shoulder height, unless your health care provider tells you to  do that. ? Avoid shrugging your shoulder while you raise your arm. Keep your shoulder blade tucked down toward the middle of your back. 3. Hold for __________ seconds. 4. Slowly return to the starting position. Repeat __________ times. Complete this exercise __________ times a day. Exercise D:Internal Rotation  1. Place your left / right hand behind your back, palm-up. 2. Use your other hand to dangle an exercise band, a towel, or a similar object over your shoulder. Grasp the band with your  left / right hand so you are holding onto both ends. 3. Gently pull up on the band until you feel a stretch in the front of your left / right shoulder. ? Avoid shrugging your shoulder while you raise your arm. Keep your shoulder blade tucked down toward the middle of your back. 4. Hold for __________ seconds. 5. Release the stretch by letting go of the band and lowering your hands. Repeat __________ times. Complete this exercise __________ times a day. STRETCHING EXERCISES These exercises warm up your muscles and joints and improve the movement and flexibility of your shoulder. These exercises also help to relieve pain, numbness, and tingling. These exercises are done using your healthy shoulder to help stretch the muscles of your injured shoulder. Exercise E: Warehouse manager (External Rotation and Abduction)  1. Stand in a doorway with one of your feet slightly in front of the other. This is called a staggered stance. If you cannot reach your forearms to the door frame, stand facing a corner of a room. 2. Choose one of the following positions as told by your health care provider: ? Place your hands and forearms on the door frame above your head. ? Place your hands and forearms on the door frame at the height of your head. ? Place your hands on the door frame at the height of your elbows. 3. Slowly move your weight onto your front foot until you feel a stretch across your chest and in the front of your shoulders. Keep your head and chest upright and keep your abdominal muscles tight. 4. Hold for __________ seconds. 5. To release the stretch, shift your weight to your back foot. Repeat __________ times. Complete this stretch __________ times a day. Exercise F:Extension, Standing 1. Stand and hold a broomstick, a cane, or a similar object behind your back. ? Your hands should be a little wider than shoulder-width apart. ? Your palms should face away from your back. 2. Keeping your elbows  straight and keeping your shoulder muscles relaxed, move the stick away from your body until you feel a stretch in your shoulder. ? Avoid shrugging your shoulders while you move the stick. Keep your shoulder blade tucked down toward the middle of your back. 3. Hold for __________ seconds. 4. Slowly return to the starting position. Repeat __________ times. Complete this exercise __________ times a day. STRENGTHENING EXERCISES These exercises build strength and endurance in your shoulder. Endurance is the ability to use your muscles for a long time, even after they get tired. Exercise G:External Rotation  1. Sit in a stable chair without armrests. 2. Secure an exercise band at elbow height on your left / right side. 3. Place a soft object, such as a folded towel or a small pillow, between your left / right upper arm and your body to move your elbow a few inches away (about 10 cm) from your side. 4. Hold the end of the band so it is tight and there is no slack.  5. Keeping your elbow pressed against the soft object, move your left / right forearm out, away from your abdomen. Keep your body steady so only your forearm moves. 6. Hold for __________ seconds. 7. Slowly return to the starting position. Repeat __________ times. Complete this exercise __________ times a day. Exercise H:Shoulder Abduction  1. Sit in a stable chair without armrests, or stand. 2. Hold a __________ weight in your left / right hand, or hold an exercise band with both hands. 3. Start with your arms straight down and your left / right palm facing in, toward your body. 4. Slowly lift your left / right hand out to your side. Do not lift your hand above shoulder height unless your health care provider tells you that this is safe. ? Keep your arms straight. ? Avoid shrugging your shoulder while you do this movement. Keep your shoulder blade tucked down toward the middle of your back. 5. Hold for __________ seconds. 6. Slowly  lower your arm, and return to the starting position. Repeat __________ times. Complete this exercise __________ times a day. Exercise I:Shoulder Extension 1. Sit in a stable chair without armrests, or stand. 2. Secure an exercise band to a stable object in front of you where it is at shoulder height. 3. Hold one end of the exercise band in each hand. Your palms should face each other. 4. Straighten your elbows and lift your hands up to shoulder height. 5. Step back, away from the secured end of the exercise band, until the band is tight and there is no slack. 6. Squeeze your shoulder blades together as you pull your hands down to the sides of your thighs. Stop when your hands are straight down by your sides. Do not let your hands go behind your body. 7. Hold for __________ seconds. 8. Slowly return to the starting position. Repeat __________ times. Complete this exercise __________ times a day. Exercise J:Standing Shoulder Row 1. Sit in a stable chair without armrests, or stand. 2. Secure an exercise band to a stable object in front of you so it is at waist height. 3. Hold one end of the exercise band in each hand. Your palms should be in a thumbs-up position. 4. Bend each of your elbows to an "L" shape (about 90 degrees) and keep your upper arms at your sides. 5. Step back until the band is tight and there is no slack. 6. Slowly pull your elbows back behind you. 7. Hold for __________ seconds. 8. Slowly return to the starting position. Repeat __________ times. Complete this exercise __________ times a day. Exercise K:Shoulder Press-Ups  1. Sit in a stable chair that has armrests. Sit upright, with your feet flat on the floor. 2. Put your hands on the armrests so your elbows are bent and your fingers are pointing forward. Your hands should be about even with the sides of your body. 3. Push down on the armrests and use your arms to lift yourself off of the chair. Straighten your elbows  and lift yourself up as much as you comfortably can. ? Move your shoulder blades down, and avoid letting your shoulders move up toward your ears. ? Keep your feet on the ground. As you get stronger, your feet should support less of your body weight as you lift yourself up. 4. Hold for __________ seconds. 5. Slowly lower yourself back into the chair. Repeat __________ times. Complete this exercise __________ times a day. Exercise L: Wall Push-Ups  1. Stand so  you are facing a stable wall. Your feet should be about one arm-length away from the wall. 2. Lean forward and place your palms on the wall at shoulder height. 3. Keep your feet flat on the floor as you bend your elbows and lean forward toward the wall. 4. Hold for __________ seconds. 5. Straighten your elbows to push yourself back to the starting position. Repeat __________ times. Complete this exercise __________ times a day. This information is not intended to replace advice given to you by your health care provider. Make sure you discuss any questions you have with your health care provider. Document Released: 11/25/2004 Document Revised: 10/06/2015 Document Reviewed: 09/22/2014 Elsevier Interactive Patient Education  2018 [employer].

## 2017-03-17 NOTE — Assessment & Plan Note (Signed)
Continues to drink 8-12 cans of beer daily. Discussed importance of cutting back, LFT's in November 2018 stable.

## 2017-03-17 NOTE — Assessment & Plan Note (Signed)
Noted to be elevated on past several visits. He's monitoring at home and endorses readings in the 130's/80. He'll continue to monitor and notify if readings are at or above 135/90.

## 2017-03-23 ENCOUNTER — Telehealth: Payer: Self-pay | Admitting: Primary Care

## 2017-03-23 DIAGNOSIS — I10 Essential (primary) hypertension: Secondary | ICD-10-CM

## 2017-03-23 MED ORDER — HYDROCHLOROTHIAZIDE 25 MG PO TABS: ORAL_TABLET | ORAL | 0 refills | Status: DC

## 2017-03-23 NOTE — Telephone Encounter (Signed)
Copied from [place]. Topic: Quick Communication - See Telephone Encounter >> Mar 23, 2017  4:05 PM Vernona Rieger wrote: CRM for notification. See Telephone encounter for:   03/23/17.  Patient said he saw Belenda Cruise on 2/21. She told him to take his blood pressure and document it & call and let the office know what those readings were.  2/24 BP 152/90 2/25 BP 157/90 2/26 BP 155/96 2/27 BP 150/93

## 2017-03-23 NOTE — Telephone Encounter (Signed)
Please notify patient that I would like to add a blood pressure medication called hydrochlorothiazide 25 mg to his regimen.  Take 1 tablet by mouth once daily.  He can take this with his amlodipine.  Have him continue to monitor blood pressure readings and schedule an appointment for him to follow-up in 2 weeks.

## 2017-03-24 NOTE — Telephone Encounter (Signed)
Noted  

## 2017-03-24 NOTE — Telephone Encounter (Signed)
Spoken and notified patient of Brandon Lopez's comments. Patient is  agreeable to hydrochlorothiazide to [facility] on [address].  Patient stated that he will call back and schedule the follow up.

## 2017-03-25 ENCOUNTER — Telehealth: Payer: Self-pay | Admitting: Primary Care

## 2017-03-25 NOTE — Telephone Encounter (Signed)
Copied from [place] [id number]. Topic: Quick Communication - Rx Refill/Question >> Mar 25, 2017 10:27 AM Boyd Kerbs wrote: Medication: hydrochlorothiazide (HYDRODIURIL) 25 MG tablet   Has the patient contacted their pharmacy? Yes.     (Agent: If no, request that the patient contact the pharmacy for the refill.)   Preferred Pharmacy (with phone number or street name):   [facility] [address], [place] [address] Phone: [phone] Fax: [phone]    Agent: Please be advised that RX refills may take up to 3 business days. We ask that you follow-up with your pharmacy.

## 2017-03-25 NOTE — Telephone Encounter (Signed)
Hydrodiuril refilled 03/23/17 at [facility].

## 2017-04-21 ENCOUNTER — Ambulatory Visit (INDEPENDENT_AMBULATORY_CARE_PROVIDER_SITE_OTHER): Payer: PPO | Admitting: Primary Care

## 2017-04-21 ENCOUNTER — Telehealth: Payer: Self-pay | Admitting: Primary Care

## 2017-04-21 ENCOUNTER — Encounter: Payer: Self-pay | Admitting: Primary Care

## 2017-04-21 VITALS — BP 152/82 | HR 66 | Temp 98.0°F | Ht 65.0 in | Wt 177.0 lb

## 2017-04-21 DIAGNOSIS — I10 Essential (primary) hypertension: Secondary | ICD-10-CM | POA: Diagnosis not present

## 2017-04-21 DIAGNOSIS — F1010 Alcohol abuse, uncomplicated: Secondary | ICD-10-CM

## 2017-04-21 MED ORDER — OLMESARTAN MEDOXOMIL-HCTZ 40-25 MG PO TABS: 1.0000 | ORAL_TABLET | Freq: Every day | ORAL | 0 refills | Status: DC

## 2017-04-21 NOTE — Assessment & Plan Note (Signed)
Uncontrolled despite addition of HCTZ 25 mg.  Will continue Amlodipine 5 mg. Change to olmesartan-HCTZ 40-25 mg once daily.  Follow up in 2 weeks for BP check and BMP. He will monitor BP at home in the interim.

## 2017-04-21 NOTE — Progress Notes (Signed)
Subjective:    Patient ID: Brandon Lopez, male    DOB: [birth year], 62 y.o.   MRN: [id number]  HPI  Brandon Lopez is a 62 year old male who present today for follow up of hypertension.  He is currently managed on HCTZ 25 mg, Amlodipine 5 mg. Hydrochlorothiazide 25 mg was added in late February 2019 due to persistent elevated readings.   He's checking his blood pressure at home which is running 150;s/80's. He continues to drink 8-10 cans of beer daily. He denies chest pain and shortness of breath. He has noticed minor headaches and dizziness intermittently.   BP Readings from Last 3 Encounters:  04/21/17 (!) 152/82  03/17/17 (!) 146/92  12/23/16 140/80     Review of Systems  Respiratory: Negative for cough and shortness of breath.   Cardiovascular: Negative for chest pain.  Neurological: Positive for dizziness and headaches.       Past Medical History:  Diagnosis Date  . Arthritis   . CHF (congestive heart failure) ([place])   . COPD (chronic obstructive pulmonary disease) ([place])   . Depression   . Diverticulosis    Found during colonoscopy  . Hypertension   . Kidney stones   . 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 Presence of permanent cardiac pacemaker    Biotronik; Dayna Barker DR-T [id number]; placed 08/2012  . Sleep apnea      Social History   Socioeconomic History  . Marital status: Single    Spouse name: Not on file  . Number of children: Not on file  . Years of education: Not on file  . Highest education level: Not on file  Occupational History  . Not on file  Social Needs  . Financial resource strain: Not on file  . Food insecurity:    Worry: Not on file    Inability: Not on file  . Transportation needs:    Medical: Not on file    Non-medical: Not on file  Tobacco Use  . Smoking status: Heavy Tobacco Smoker    Packs/day: 2.00    Years: 40.00    Pack years: 80.00    Types: Cigarettes  . Smokeless tobacco: Never Used  Substance and Sexual Activity  . Alcohol use:  Yes    Alcohol/week: 7.2 oz    Types: 12 Cans of beer per week  . Drug use: Yes    Types: Marijuana  . Sexual activity: Not on file  Lifestyle  . Physical activity:    Days per week: Not on file    Minutes per session: Not on file  . Stress: Not on file  Relationships  . Social connections:    Talks on phone: Not on file    Gets together: Not on file    Attends religious service: Not on file    Active member of club or organization: Not on file    Attends meetings of clubs or organizations: Not on file    Relationship status: Not on file  . Intimate partner violence:    Fear of current or ex partner: Not on file    Emotionally abused: Not on file    Physically abused: Not on file    Forced sexual activity: Not on file  Other Topics Concern  . Not on file  Social History Narrative  . Not on file    Past Surgical History:  Procedure Laterality Date  . APPENDECTOMY  age 58  . CARDIAC CATHETERIZATION     7 years ago  .  COLONOSCOPY N/A 05/27/2014   Procedure: COLONOSCOPY;  Surgeon: Hulen Luster, MD;  Location: [facility] ENDOSCOPY;  Service: Gastroenterology;  Laterality: N/A;  . INSERT / REPLACE / REMOVE PACEMAKER     3 years ago    Family History  Problem Relation Age of Onset  . Arthritis Mother   . Heart disease Father   . Hyperlipidemia Father   . Hypertension Father   . Cancer Paternal Grandfather     Allergies  Allergen Reactions  . Simvastatin Diarrhea  . Lisinopril Cough    Current Outpatient Medications on File Prior to Visit  Medication Sig Dispense Refill  . albuterol (PROAIR HFA) 108 (90 Base) MCG/ACT inhaler Inhale 2 puffs into the lungs every 6 (six) hours as needed for wheezing or shortness of breath. 18 g 2  . amLODipine (NORVASC) 5 MG tablet TAKE 1 TABLET BY MOUTH ONCE DAILY 90 tablet 1  . aspirin 81 MG tablet Take 81 mg by mouth daily.    Marland Kitchen atorvastatin (LIPITOR) 20 MG tablet TAKE 1 TABLET BY MOUTH IN THE EVENING 90 tablet 1  . gabapentin (NEURONTIN)  100 MG capsule Take 2 capsules in the morning and 2 capsules at bedtime. 120 capsule 0  . hydrochlorothiazide (HYDRODIURIL) 25 MG tablet Take 1 tablet by mouth once daily for high blood pressure. 30 tablet 0  . loratadine (CLARITIN) 10 MG tablet Take 20 mg by mouth daily.    . SYMBICORT 160-4.5 MCG/ACT inhaler INHALE TWO PUFFS BY MOUTH TWICE DAILY 10.2 g 3   No current facility-administered medications on file prior to visit.     BP (!) 152/82   Pulse 66   Temp 98 F (36.7 C) (Oral)   Ht 5\' 5"  (1.651 m)   Wt 177 lb (80.3 kg)   SpO2 97%   BMI 29.45 kg/m    Objective:   Physical Exam  Constitutional: He is oriented to person, place, and time. He appears well-nourished.  Neck: Neck supple.  Cardiovascular: Normal rate and regular rhythm.  Pulmonary/Chest: Effort normal and breath sounds normal. He has no wheezes. He has no rales.  Neurological: He is alert and oriented to person, place, and time.  Skin: Skin is warm and dry.          Assessment & Plan:

## 2017-04-21 NOTE — Patient Instructions (Signed)
Continue taking Amlodipine 5 mg for high blood pressure.  Stop taking hydrochlorothiazide 25 mg tablets for high blood pressure.  Start olmesartan-hydrochlorothiazide 40-25 mg tablets for high blood pressure.  Continue to monitor your blood pressure and follow up in 2 weeks for recheck.  It was a pleasure to see you today!

## 2017-04-21 NOTE — Assessment & Plan Note (Signed)
Discussed that alcohol abuse could be contributing to hypertension. He continues to drink 8-10 beers daily despite this. He is not ready to quit.

## 2017-04-21 NOTE — Telephone Encounter (Signed)
Copied from [place] [id number]. Topic: Quick Communication - See Telephone Encounter >> Apr 21, 2017  3:58 PM Ivar Drape wrote: CRM for notification. See Telephone encounter for: 04/21/17. Patient was prescribed olmesartan-hydrochlorothiazide (BENICAR HCT) 40-25 MG tablet today but his preferred pharmacist [facility] on [address]. Michela Pitcher they are out of the medication and they don't know when or if they will get it in again. Please advise.

## 2017-04-22 MED ORDER — TELMISARTAN-HCTZ 80-25 MG PO TABS: 1.0000 | ORAL_TABLET | Freq: Every day | ORAL | 0 refills | Status: DC

## 2017-04-22 NOTE — Telephone Encounter (Signed)
Spoke with pharmacist at [facility], they have telmisartan-HCTZ so this will be sent to the pharmacy.  Will cancel olmesartan-HCTZ. Please notify patient of new Rx, take 1 tablet once daily.

## 2017-04-22 NOTE — Telephone Encounter (Signed)
Spoke to pt

## 2017-05-11 ENCOUNTER — Other Ambulatory Visit: Payer: Self-pay | Admitting: Primary Care

## 2017-05-11 DIAGNOSIS — J449 Chronic obstructive pulmonary disease, unspecified: Secondary | ICD-10-CM

## 2017-05-11 DIAGNOSIS — M542 Cervicalgia: Secondary | ICD-10-CM

## 2017-05-11 DIAGNOSIS — G8929 Other chronic pain: Secondary | ICD-10-CM

## 2017-05-26 ENCOUNTER — Other Ambulatory Visit: Payer: Self-pay | Admitting: Primary Care

## 2017-05-26 DIAGNOSIS — I10 Essential (primary) hypertension: Secondary | ICD-10-CM

## 2017-05-27 NOTE — Telephone Encounter (Signed)
Ok to refill? Electronically refill request for telmisartan-hydrochlorothiazide (MICARDIS HCT) 80-25 MG tablet  Last prescribed on 04/22/2017. Last seen on 04/21/2017

## 2017-05-27 NOTE — Telephone Encounter (Signed)
Patient needs office visit for BP follow up. please schedule.

## 2017-05-30 NOTE — Telephone Encounter (Signed)
Please schedule appointment as instructed. 

## 2017-05-31 ENCOUNTER — Other Ambulatory Visit: Payer: Self-pay | Admitting: Primary Care

## 2017-05-31 DIAGNOSIS — I10 Essential (primary) hypertension: Secondary | ICD-10-CM

## 2017-06-01 NOTE — Telephone Encounter (Signed)
lvm asking pt to call back and schedule

## 2017-07-21 ENCOUNTER — Encounter: Payer: Self-pay | Admitting: Internal Medicine

## 2017-08-09 ENCOUNTER — Observation Stay
Admission: EM | Admit: 2017-08-09 | Discharge: 2017-08-11 | Disposition: A | Discharge status: Home / Self Care | Payer: PPO | Attending: Internal Medicine | Admitting: Internal Medicine

## 2017-08-09 ENCOUNTER — Other Ambulatory Visit: Payer: Self-pay

## 2017-08-09 ENCOUNTER — Encounter: Payer: Self-pay | Admitting: *Deleted

## 2017-08-09 ENCOUNTER — Emergency Department: Payer: PPO

## 2017-08-09 DIAGNOSIS — J449 Chronic obstructive pulmonary disease, unspecified: Secondary | ICD-10-CM | POA: Insufficient documentation

## 2017-08-09 DIAGNOSIS — Z7982 Long term (current) use of aspirin: Secondary | ICD-10-CM | POA: Diagnosis not present

## 2017-08-09 DIAGNOSIS — E876 Hypokalemia: Secondary | ICD-10-CM | POA: Insufficient documentation

## 2017-08-09 DIAGNOSIS — F17210 Nicotine dependence, cigarettes, uncomplicated: Secondary | ICD-10-CM | POA: Diagnosis not present

## 2017-08-09 DIAGNOSIS — I5032 Chronic diastolic (congestive) heart failure: Secondary | ICD-10-CM | POA: Diagnosis not present

## 2017-08-09 DIAGNOSIS — F1010 Alcohol abuse, uncomplicated: Secondary | ICD-10-CM | POA: Insufficient documentation

## 2017-08-09 DIAGNOSIS — R079 Chest pain, unspecified: Secondary | ICD-10-CM | POA: Diagnosis not present

## 2017-08-09 DIAGNOSIS — I480 Paroxysmal atrial fibrillation: Secondary | ICD-10-CM | POA: Insufficient documentation

## 2017-08-09 DIAGNOSIS — I110 Hypertensive heart disease with heart failure: Secondary | ICD-10-CM | POA: Diagnosis not present

## 2017-08-09 DIAGNOSIS — R0789 Other chest pain: Secondary | ICD-10-CM | POA: Diagnosis not present

## 2017-08-09 DIAGNOSIS — Z950 Presence of cardiac pacemaker: Secondary | ICD-10-CM | POA: Diagnosis not present

## 2017-08-09 DIAGNOSIS — I429 Cardiomyopathy, unspecified: Secondary | ICD-10-CM | POA: Diagnosis not present

## 2017-08-09 DIAGNOSIS — E785 Hyperlipidemia, unspecified: Secondary | ICD-10-CM | POA: Insufficient documentation

## 2017-08-09 DIAGNOSIS — I959 Hypotension, unspecified: Secondary | ICD-10-CM | POA: Diagnosis not present

## 2017-08-09 DIAGNOSIS — R55 Syncope and collapse: Principal | ICD-10-CM | POA: Diagnosis present

## 2017-08-09 DIAGNOSIS — G4733 Obstructive sleep apnea (adult) (pediatric): Secondary | ICD-10-CM | POA: Diagnosis not present

## 2017-08-09 DIAGNOSIS — R072 Precordial pain: Secondary | ICD-10-CM | POA: Diagnosis not present

## 2017-08-09 DIAGNOSIS — Z79899 Other long term (current) drug therapy: Secondary | ICD-10-CM | POA: Diagnosis not present

## 2017-08-09 DIAGNOSIS — R0902 Hypoxemia: Secondary | ICD-10-CM | POA: Diagnosis not present

## 2017-08-09 LAB — CBC
HEMATOCRIT: 44.2 % (ref 40.0–52.0)
HEMOGLOBIN: 15.7 g/dL (ref 13.0–18.0)
MCH: 33.7 pg (ref 26.0–34.0)
MCHC: 35.6 g/dL (ref 32.0–36.0)
MCV: 94.7 fL (ref 80.0–100.0)
Platelets: 222 10*3/uL (ref 150–440)
RBC: 4.66 MIL/uL (ref 4.40–5.90)
RDW: 13 % (ref 11.5–14.5)
WBC: 9.6 10*3/uL (ref 3.8–10.6)

## 2017-08-09 LAB — BASIC METABOLIC PANEL
ANION GAP: 9 (ref 5–15)
BUN: 10 mg/dL (ref 8–23)
CO2: 24 mmol/L (ref 22–32)
Calcium: 8.5 mg/dL — ABNORMAL LOW (ref 8.9–10.3)
Chloride: 103 mmol/L (ref 98–111)
Creatinine, Ser: 0.73 mg/dL (ref 0.61–1.24)
GFR calc Af Amer: >60 mL/min (ref >60)
GFR calc non Af Amer: >60 mL/min (ref >60)
GLUCOSE: 96 mg/dL (ref 70–99)
POTASSIUM: 3.1 mmol/L — AB (ref 3.5–5.1)
Sodium: 136 mmol/L (ref 135–145)

## 2017-08-09 LAB — ETHANOL: Alcohol, Ethyl (B): <10 mg/dL (ref <10)

## 2017-08-09 LAB — TROPONIN I: Troponin I: <0.03 ng/mL (ref <0.03)

## 2017-08-09 NOTE — ED Notes (Signed)
Unable to interrogate the pacemaker, [employer] called and will send a representative.  Dr Joni Fears aware.

## 2017-08-09 NOTE — ED Triage Notes (Signed)
Pt brought in via ems from home with near syncopal episode while sitting on the porch today.  etoh use and drug use today.  Pt alert. Pt also reports chest pain.  No sob.   Iv in place

## 2017-08-09 NOTE — ED Notes (Signed)
Pt brought in via ems from home with near syncopal episode on the porch tonight. Pt states he drank 1.5 beers and smoked marijuana today.  Pt also had a tooth pulled today.  Pt states he did not eat much today and took 2 tylenol for tooth pain.   Pt also reports left side chest pain.  nonradiating pain.  No sob. Pt alert. Iv in place.  Paced rhythm on monitor.

## 2017-08-09 NOTE — ED Notes (Signed)
ED Provider at bedside. 

## 2017-08-10 ENCOUNTER — Other Ambulatory Visit: Payer: Self-pay

## 2017-08-10 DIAGNOSIS — I4891 Unspecified atrial fibrillation: Secondary | ICD-10-CM | POA: Diagnosis not present

## 2017-08-10 DIAGNOSIS — R55 Syncope and collapse: Secondary | ICD-10-CM

## 2017-08-10 DIAGNOSIS — I5032 Chronic diastolic (congestive) heart failure: Secondary | ICD-10-CM | POA: Diagnosis not present

## 2017-08-10 DIAGNOSIS — I10 Essential (primary) hypertension: Secondary | ICD-10-CM | POA: Diagnosis not present

## 2017-08-10 HISTORY — DX: Syncope and collapse: R55

## 2017-08-10 LAB — TROPONIN I
Troponin I: <0.03 ng/mL (ref <0.03)
Troponin I: <0.03 ng/mL (ref <0.03)
Troponin I: <0.03 ng/mL (ref <0.03)

## 2017-08-10 LAB — PROTIME-INR
INR: 0.97
Prothrombin Time: 12.8 seconds (ref 11.4–15.2)

## 2017-08-10 LAB — HEPARIN LEVEL (UNFRACTIONATED): Heparin Unfractionated: <0.1 IU/mL — ABNORMAL LOW (ref 0.30–0.70)

## 2017-08-10 LAB — TSH: TSH: 1.288 u[IU]/mL (ref 0.350–4.500)

## 2017-08-10 LAB — APTT: aPTT: 25 seconds (ref 24–36)

## 2017-08-10 MED ORDER — POTASSIUM CHLORIDE CRYS ER 20 MEQ PO TBCR
40.0000 meq | EXTENDED_RELEASE_TABLET | ORAL | Status: AC
  Administered 2017-08-10: 40 meq via ORAL
  Filled 2017-08-10: qty 2

## 2017-08-10 MED ORDER — LORATADINE 10 MG PO TABS: 10.0000 mg | ORAL_TABLET | Freq: Every day | ORAL | Status: DC | PRN

## 2017-08-10 MED ORDER — ACETAMINOPHEN 650 MG RE SUPP: 650.0000 mg | Freq: Four times a day (QID) | RECTAL | Status: DC | PRN

## 2017-08-10 MED ORDER — AMLODIPINE BESYLATE 5 MG PO TABS
5.0000 mg | ORAL_TABLET | Freq: Every day | ORAL | Status: DC
  Administered 2017-08-10 – 2017-08-11 (×2): 5 mg via ORAL
  Filled 2017-08-10 (×2): qty 1

## 2017-08-10 MED ORDER — FLUTICASONE FUROATE-VILANTEROL 200-25 MCG/INH IN AEPB
1.0000 | INHALATION_SPRAY | Freq: Every day | RESPIRATORY_TRACT | Status: DC
Start: 2017-08-10 — End: 2017-08-11
  Administered 2017-08-10: 1 via RESPIRATORY_TRACT
  Filled 2017-08-10: qty 28

## 2017-08-10 MED ORDER — HYDROCHLOROTHIAZIDE 25 MG PO TABS
25.0000 mg | ORAL_TABLET | Freq: Every day | ORAL | Status: DC
  Administered 2017-08-10 – 2017-08-11 (×2): 25 mg via ORAL
  Filled 2017-08-10 (×2): qty 1

## 2017-08-10 MED ORDER — DOCUSATE SODIUM 100 MG PO CAPS
100.0000 mg | ORAL_CAPSULE | Freq: Two times a day (BID) | ORAL | Status: DC
  Administered 2017-08-10: 100 mg via ORAL
  Filled 2017-08-10 (×3): qty 1

## 2017-08-10 MED ORDER — ALBUTEROL SULFATE (2.5 MG/3ML) 0.083% IN NEBU
2.5000 mg | INHALATION_SOLUTION | RESPIRATORY_TRACT | Status: DC | PRN
  Filled 2017-08-10: qty 3

## 2017-08-10 MED ORDER — IRBESARTAN 150 MG PO TABS
300.0000 mg | ORAL_TABLET | Freq: Every day | ORAL | Status: DC
  Administered 2017-08-10 – 2017-08-11 (×2): 300 mg via ORAL
  Filled 2017-08-10 (×2): qty 2

## 2017-08-10 MED ORDER — ONDANSETRON HCL 4 MG/2ML IJ SOLN: 4.0000 mg | Freq: Four times a day (QID) | INTRAMUSCULAR | Status: DC | PRN

## 2017-08-10 MED ORDER — GABAPENTIN 100 MG PO CAPS
100.0000 mg | ORAL_CAPSULE | Freq: Two times a day (BID) | ORAL | Status: DC | PRN
  Filled 2017-08-10: qty 1

## 2017-08-10 MED ORDER — ACETAMINOPHEN 325 MG PO TABS: 650.0000 mg | ORAL_TABLET | Freq: Four times a day (QID) | ORAL | Status: DC | PRN

## 2017-08-10 MED ORDER — TELMISARTAN-HCTZ 80-25 MG PO TABS: 1.0000 | ORAL_TABLET | Freq: Every day | ORAL | Status: DC

## 2017-08-10 MED ORDER — ATORVASTATIN CALCIUM 20 MG PO TABS
20.0000 mg | ORAL_TABLET | Freq: Every evening | ORAL | Status: DC
  Administered 2017-08-10: 20 mg via ORAL
  Filled 2017-08-10: qty 1

## 2017-08-10 MED ORDER — ASPIRIN EC 81 MG PO TBEC
81.0000 mg | DELAYED_RELEASE_TABLET | Freq: Every day | ORAL | Status: DC
  Administered 2017-08-10: 81 mg via ORAL
  Filled 2017-08-10 (×2): qty 1

## 2017-08-10 MED ORDER — ONDANSETRON HCL 4 MG PO TABS: 4.0000 mg | ORAL_TABLET | Freq: Four times a day (QID) | ORAL | Status: DC | PRN

## 2017-08-10 MED ORDER — ENOXAPARIN SODIUM 80 MG/0.8ML ~~LOC~~ SOLN
80.0000 mg | Freq: Two times a day (BID) | SUBCUTANEOUS | Status: DC
  Administered 2017-08-10 – 2017-08-11 (×3): 80 mg via SUBCUTANEOUS
  Filled 2017-08-10 (×4): qty 0.8

## 2017-08-10 NOTE — ED Notes (Signed)
Contacted dietary for meal tray to be brought for pt.

## 2017-08-10 NOTE — ED Notes (Signed)
Spoke with Dr. Posey Pronto as pt requested an update on plan of care. Will repeat troponin and pt is ok to eat per MD Posey Pronto. Pt denies any CP or SOB. Pt ambulated to and from restroom with steady gait, denies any dizziness. VSS. Call bell within reach, will continue to monitor.

## 2017-08-10 NOTE — ED Notes (Signed)
Pt waiting on admission. Pt alert.  Paced rhythm on monitor.

## 2017-08-10 NOTE — ED Notes (Signed)
Report off to lisa rn  

## 2017-08-10 NOTE — ED Notes (Signed)
This RN to bedside to introduce self to patient. Pt is alert and oriented, requesting something to, explained to patient will need to introduce self to rest of patient and then would assess. Pt states understanding. Denies any further needs.

## 2017-08-10 NOTE — ED Notes (Signed)
Pt sleeping soundly in darkened exam room; resp even/unlab with no distress noted

## 2017-08-10 NOTE — ED Notes (Signed)
Patient placed in hospital bed for patient comfort to await admission. Pt tolerated well. Alert and oriented, call and cooperative at this time. Will continue to monitor for further patient needs.

## 2017-08-10 NOTE — ED Notes (Signed)
Pt awakens easily with no c/o at this time; voices good understanding of plan of care

## 2017-08-10 NOTE — Progress Notes (Signed)
ANTICOAGULATION CONSULT NOTE - Initial Consult  Pharmacy Consult for enoxaparin Indication: atrial fibrillation  Allergies  Allergen Reactions  . Simvastatin Diarrhea  . Lisinopril Cough    Patient Measurements: Height: 5\' 6"  (167.6 cm) Weight: 177 lb (80.3 kg) IBW/kg (Calculated) : 63.8  Vital Signs: Temp: 98.6 F (37 C) (07/16 2038) Temp Source: Oral (07/16 2038) BP: 105/77 (07/17 0400) Pulse Rate: 85 (07/17 0400)  Labs: Recent Labs    08/09/17 2038  HGB 15.7  HCT 44.2  PLT 222  CREATININE 0.73  TROPONINI <0.03    Estimated Creatinine Clearance: 96.6 mL/min (by C-G formula based on SCr of 0.73 mg/dL).   Medical History: Past Medical History:  Diagnosis Date  . Arthritis   . CHF (congestive heart failure) ([place])   . COPD (chronic obstructive pulmonary disease) ([place])   . Depression   . Diverticulosis    Found during colonoscopy  . Hypertension   . Kidney stones   . 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 Presence of permanent cardiac pacemaker    [employer]; Dayna Barker DR-T [id number]; placed 08/2012  . Sleep apnea     Medications:  Scheduled:  . enoxaparin (LOVENOX) injection  80 mg Subcutaneous Q12H  . potassium chloride  40 mEq Oral STAT    Assessment: Patient admitted for near syncope w/ implanted pacemaker. EKG shows a paced atrial rhythm w/ afib. Patient is not on any anticoagulation PTA for afib. Patient is being started on full dose lovenox for anticoagulation for afib.  Goal of Therapy:  Anti-Xa level 0.6-1 units/ml 4hrs after LMWH dose given Monitor platelets by anticoagulation protocol: Yes   Plan:  Will start patient on enoxaparin 1 mg/kg q12h. Will dose enoxaparin 80 mg subq every 12 hours. Baseline aPTT, PT/INR and anti-Xa levels drawn. Will monitor for s/sx for bleeding  Tobie Lords, PharmD, BCPS Clinical Pharmacist 08/10/2017

## 2017-08-10 NOTE — ED Notes (Signed)
Pt awake   No chest pain. Pt waiting on admission

## 2017-08-10 NOTE — ED Notes (Signed)
Pt given meal tray at this time 

## 2017-08-10 NOTE — ED Provider Notes (Signed)
-----------------------------------------   12:14 AM on 08/10/2017 -----------------------------------------  [employer] rep interrogated the pacemaker, notes that the patient did have a 3-hour episode of atrial fibrillation yesterday but no acute events today.  Pacemaker functioning appropriately.  If further information needed, see media tab of chart for scanned report.  Can Development worker, international aid Ted at [phone]   Carrie Mew, MD 08/10/17 [phone]

## 2017-08-10 NOTE — H&P (Signed)
Brandon Lopez is an 62 y.o. male.   Chief Complaint: Syncope HPI: The patient with past medical history of CHF, hypertension and arrhythmia status post pacemaker placement presents to the emergency department after syncopal episode on his porch.  Apparently he indicated to his wife that he did not feel well and she came to his aid but found him nearly unresponsive and cyanotic in appearance.  This did not last long and the patient was transported to the emergency department where his pacemaker was interrogated to show several hours of A. fib.  In the emergency department the patient's heart rate returned to normal and he denied chest pain, nausea or vomiting.  Due to this episode of probable cardiogenic syncope the emergency department staff called the hospitalist service for admission.  Past Medical History:  Diagnosis Date  . Arthritis   . CHF (congestive heart failure) ([place])   . COPD (chronic obstructive pulmonary disease) ([place])   . Depression   . Diverticulosis    Found during colonoscopy  . Hypertension   . Kidney stones   . 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 Presence of permanent cardiac pacemaker    [employer]; Dayna Barker DR-T [id number]; placed 08/2012  . Sleep apnea     Past Surgical History:  Procedure Laterality Date  . APPENDECTOMY  age 70  . CARDIAC CATHETERIZATION     7 years ago  . COLONOSCOPY N/A 05/27/2014   Procedure: COLONOSCOPY;  Surgeon: Hulen Luster, MD;  Location: [facility] ENDOSCOPY;  Service: Gastroenterology;  Laterality: N/A;  . INSERT / REPLACE / REMOVE PACEMAKER     3 years ago    Family History  Problem Relation Age of Onset  . Arthritis Mother   . Heart disease Father   . Hyperlipidemia Father   . Hypertension Father   . Cancer Paternal Grandfather    Social History:  reports that he has been smoking cigarettes.  He has a 80.00 pack-year smoking history. He has never used smokeless tobacco. He reports that he drinks about 7.2 oz of alcohol per week. He reports that he  has current or past drug history. Drug: Marijuana.  Allergies:  Allergies  Allergen Reactions  . Simvastatin Diarrhea  . Lisinopril Cough     (Not in a hospital admission)  Results for orders placed or performed during the hospital encounter of 08/09/17 (from the past 48 hour(s))  Basic metabolic panel     Status: Abnormal   Collection Time: 08/09/17  8:38 PM  Result Value Ref Range   Sodium 136 135 - 145 mmol/L   Potassium 3.1 (L) 3.5 - 5.1 mmol/L   Chloride 103 98 - 111 mmol/L    Comment: Please note change in reference range.   CO2 24 22 - 32 mmol/L   Glucose, Bld 96 70 - 99 mg/dL    Comment: Please note change in reference range.   BUN 10 8 - 23 mg/dL    Comment: Please note change in reference range.   Creatinine, Ser 0.73 0.61 - 1.24 mg/dL   Calcium 8.5 (L) 8.9 - 10.3 mg/dL   GFR calc non Af Amer >60 >60 mL/min   GFR calc Af Amer >60 >60 mL/min    Comment: (NOTE) The eGFR has been calculated using the CKD EPI equation. This calculation has not been validated in all clinical situations. eGFR's persistently <60 mL/min signify possible Chronic Kidney Disease.    Anion gap 9 5 - 15    Comment: Performed at [facility], [address] [place], [place] [address]  CBC     Status: None   Collection Time: 08/09/17  8:38 PM  Result Value Ref Range   WBC 9.6 3.8 - 10.6 K/uL   RBC 4.66 4.40 - 5.90 MIL/uL   Hemoglobin 15.7 13.0 - 18.0 g/dL   HCT 44.2 40.0 - 52.0 %   MCV 94.7 80.0 - 100.0 fL   MCH 33.7 26.0 - 34.0 pg   MCHC 35.6 32.0 - 36.0 g/dL   RDW 13.0 11.5 - 14.5 %   Platelets 222 150 - 440 K/uL    Comment: Performed at [facility], [address] [place], [place] [address]  Troponin I     Status: None   Collection Time: 08/09/17  8:38 PM  Result Value Ref Range   Troponin I <0.03 <0.03 ng/mL    Comment: Performed at [facility], [address] [place], [place] [address]  Ethanol     Status: None   Collection Time: 08/09/17   8:38 PM  Result Value Ref Range   Alcohol, Ethyl (B) <10 <10 mg/dL    Comment: (NOTE) Lowest detectable limit for serum alcohol is 10 mg/dL. For medical purposes only. Performed at [facility], [address], [place], [place] [address]    Dg Chest 2 View  Result Date: 08/09/2017 CLINICAL DATA:  Syncopal episode. EXAM: CHEST - 2 VIEW COMPARISON:  04/14/2016 FINDINGS: The heart size and mediastinal contours are within normal limits. Stable appearance dual-chamber pacemaker. There is no evidence of pulmonary edema, consolidation, pneumothorax, nodule or pleural fluid. The visualized skeletal structures are unremarkable. IMPRESSION: No active cardiopulmonary disease. Electronically Signed   By: Aletta Edouard M.D.   On: 08/09/2017 21:07    Review of Systems  Constitutional: Negative for chills and fever.  HENT: Negative for sore throat and tinnitus.   Eyes: Negative for blurred vision and redness.  Respiratory: Negative for cough and shortness of breath.   Cardiovascular: Positive for chest pain. Negative for palpitations, orthopnea and PND.  Gastrointestinal: Negative for abdominal pain, diarrhea, nausea and vomiting.  Genitourinary: Negative for dysuria, frequency and urgency.  Musculoskeletal: Negative for joint pain and myalgias.  Skin: Negative for rash.       No lesions  Neurological: Positive for loss of consciousness. Negative for speech change, focal weakness and weakness.  Endo/Heme/Allergies: Does not bruise/bleed easily.       No temperature intolerance  Psychiatric/Behavioral: Negative for depression and suicidal ideas.    Blood pressure 105/77, pulse 85, temperature 98.6 F (37 C), temperature source Oral, resp. rate 14, height _0  (1.676 m), weight 80.3 kg (177 lb), SpO2 96 %. Physical Exam  Vitals reviewed. Constitutional: He is oriented to person, place, and time. He appears well-developed and well-nourished. No distress.  HENT:  Head: Normocephalic  and atraumatic.  Mouth/Throat: Oropharynx is clear and moist.  Eyes: Pupils are equal, round, and reactive to light. Conjunctivae and EOM are normal. No scleral icterus.  Neck: Normal range of motion. Neck supple. No JVD present. No tracheal deviation present. No thyromegaly present.  Cardiovascular: Normal rate, regular rhythm and normal heart sounds. Exam reveals no gallop and no friction rub.  No murmur heard. Respiratory: Effort normal and breath sounds normal. No respiratory distress.  GI: Soft. Bowel sounds are normal. He exhibits no distension. There is no tenderness.  Genitourinary:  Genitourinary Comments: Deferred  Musculoskeletal: Normal range of motion. He exhibits no edema.  Lymphadenopathy:  He has no cervical adenopathy.  Neurological: He is alert and oriented to person, place, and time. No cranial nerve deficit.  Skin: Skin is warm and dry. No rash noted. No erythema.  Psychiatric: He has a normal mood and affect. His behavior is normal. Judgment and thought content normal.     Assessment/Plan This is a 62 year old male admitted for syncope. 1.  Syncope: Etiology is likely arrhythmia.  Discussed with patient that atrial fibrillation with rapid ventricular rate can lead to lightheadedness and near syncope but his reported cyanotic appearance is more concerning.  Monitor telemetry and consult with cardiology to determine if the patient also needs an AICD in addition to his pacemaker due to potentially life-threatening arrhythmias. 2.  Atrial fibrillation: New onset; paroxysmal.  He is now in normal sinus rhythm.  I have started the patient on therapeutic Lovenox.   3.  Hypertension: Controlled; continue Micardis and amlodipine 4.  CHF: Diastolic; chronic.  Continue to monitor fluid balance. 5.  DVT prophylaxis: As above 6.  GI prophylaxis: None The patient is a full code.  Time spent on admission orders and patient care approximately 45 minutes   Harrie Foreman,  MD 08/10/2017, 6:21 AM

## 2017-08-10 NOTE — ED Notes (Signed)
Report called to [place] on floor and pt updated with plan of care.

## 2017-08-10 NOTE — ED Provider Notes (Signed)
[facility] Emergency Department Provider Note  ____________________________________________  Time seen: Approximately 12:08 AM  I have reviewed the triage vital signs and the nursing notes.   HISTORY  Chief Complaint Near Syncope    HPI Brandon Lopez is a 62 y.o. male with a history of CHF COPD who was in his usual state of health until about 7 PM tonight when he was sitting on his porch and suddenly felt very lightheaded.  He was not having any pain at that time.  He tried calling his wife who came to his side and found him unresponsive and cyanotic appearing.  After several seconds she was able to arouse him but he seemed confused for a period of time.  EMS arrived and noted blood pressure to be low transported to the ED.  He notes that since then he has had central chest pressure, nonradiating, no aggravating or alleviating factors, associated with mild shortness of breath.  No diaphoresis or vomiting.     Past Medical History:  Diagnosis Date  . Arthritis   . CHF (congestive heart failure) ([place])   . COPD (chronic obstructive pulmonary disease) ([place])   . Depression   . Diverticulosis    Found during colonoscopy  . Hypertension   . Kidney stones   . 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 Presence of permanent cardiac pacemaker    [employer]; Dayna Barker DR-T [id number]; placed 08/2012  . Sleep apnea      Patient Active Problem List   Diagnosis Date Noted  . Osteoarthritis 03/17/2017  . Edema 07/21/2016  . Cigarette smoker 11/17/2015  . Diverticulosis 12/16/2014  . Chronic obstructive pulmonary disease ([place]) 11/26/2014  . Cervical pain (neck) 10/14/2014  . Hyperlipidemia 07/11/2014  . Preventative health care 07/11/2014  . Pacemaker 06/03/2014  . Anxiety and depression 06/03/2014  . Alcohol abuse 06/03/2014  . OSA (obstructive sleep apnea) 06/03/2014  . Essential hypertension 06/03/2014  . Tobacco abuse 06/03/2014     Past Surgical History:   Procedure Laterality Date  . APPENDECTOMY  age 33  . CARDIAC CATHETERIZATION     7 years ago  . COLONOSCOPY N/A 05/27/2014   Procedure: COLONOSCOPY;  Surgeon: Hulen Luster, MD;  Location: [facility] ENDOSCOPY;  Service: Gastroenterology;  Laterality: N/A;  . INSERT / REPLACE / REMOVE PACEMAKER     3 years ago     Prior to Admission medications   Medication Sig Start Date End Date Taking? Authorizing Provider  albuterol (PROAIR HFA) 108 (90 Base) MCG/ACT inhaler Inhale 2 puffs into the lungs every 6 (six) hours as needed for wheezing or shortness of breath. 11/25/16   Pleas Koch, NP  amLODipine (NORVASC) 5 MG tablet TAKE 1 TABLET BY MOUTH ONCE DAILY 02/09/17   Pleas Koch, NP  aspirin 81 MG tablet Take 81 mg by mouth daily.    [provider]  atorvastatin (LIPITOR) 20 MG tablet TAKE 1 TABLET BY MOUTH IN THE EVENING 01/27/17   Pleas Koch, NP  gabapentin (NEURONTIN) 100 MG capsule TAKE 1 CAPSULE BY MOUTH AT BEDTIME 05/12/17   Pleas Koch, NP  loratadine (CLARITIN) 10 MG tablet Take 20 mg by mouth daily.    [provider]  olmesartan-hydrochlorothiazide (BENICAR HCT) 40-25 MG tablet Take 1 tablet by mouth daily. 04/21/17   Pleas Koch, NP  SYMBICORT 160-4.5 MCG/ACT inhaler INHALE 2 PUFFS BY MOUTH TWICE DAILY 05/12/17   Pleas Koch, NP  telmisartan-hydrochlorothiazide (MICARDIS HCT) 80-25 MG tablet TAKE  1 TABLET BY MOUTH ONCE DAILY 05/27/17   Pleas Koch, NP  telmisartan-hydrochlorothiazide (MICARDIS HCT) 80-25 MG tablet TAKE 1 TABLET BY MOUTH ONCE DAILY 05/31/17   Pleas Koch, NP     Allergies Simvastatin and Lisinopril   Family History  Problem Relation Age of Onset  . Arthritis Mother   . Heart disease Father   . Hyperlipidemia Father   . Hypertension Father   . Cancer Paternal Grandfather     Social History Social History   Tobacco Use  . Smoking status: Heavy Tobacco Smoker    Packs/day: 2.00    Years: 40.00     Pack years: 80.00    Types: Cigarettes  . Smokeless tobacco: Never Used  Substance Use Topics  . Alcohol use: Yes    Alcohol/week: 7.2 oz    Types: 12 Cans of beer per week  . Drug use: Yes    Types: Marijuana    Review of Systems  Constitutional:   No fever or chills.  ENT:   No sore throat. No rhinorrhea. Cardiovascular:   Positive as above chest pain syncope. Respiratory:   No dyspnea or cough. Gastrointestinal:   Negative for abdominal pain, vomiting and diarrhea.  Musculoskeletal:   Negative for focal pain or swelling All other systems reviewed and are negative except as documented above in ROS and HPI.  ____________________________________________   PHYSICAL EXAM:  VITAL SIGNS: ED Triage Vitals  Enc Vitals Group     BP 08/09/17 2034 122/88     Pulse Rate 08/09/17 2037 68     Resp 08/09/17 2037 20     Temp 08/09/17 2038 98.6 F (37 C)     Temp Source 08/09/17 2037 Oral     SpO2 08/09/17 2037 97 %     Weight 08/09/17 2034 177 lb (80.3 kg)     Height 08/09/17 2034 5\' 6"  (1.676 m)     Head Circumference --      Peak Flow --      Pain Score 08/09/17 2034 3     Pain Loc --      Pain Edu? --      Excl. in [place]? --   Room air oxygen saturation 95%  Vital signs reviewed, nursing assessments reviewed.   Constitutional:   Alert and oriented. Non-toxic appearance. Eyes:   Conjunctivae are normal. EOMI. PERRL. ENT      Head:   Normocephalic and atraumatic.      Nose:   No congestion/rhinnorhea.       Mouth/Throat:   MMM, no pharyngeal erythema. No peritonsillar mass.       Neck:   No meningismus. Full ROM. Hematological/Lymphatic/Immunilogical:   No cervical lymphadenopathy. Cardiovascular:   RRR. Symmetric bilateral radial and DP pulses.  No murmurs. Cap refill less than 2 seconds. Respiratory:   Normal respiratory effort without tachypnea/retractions. Breath sounds are clear and equal bilaterally. No wheezes/rales/rhonchi. Gastrointestinal:   Soft and nontender.  Non distended. There is no CVA tenderness.  No rebound, rigidity, or guarding.  Musculoskeletal:   Normal range of motion in all extremities. No joint effusions.  No lower extremity tenderness.  No edema.  Chest wall nontender Neurologic:   Normal speech and language.  Motor grossly intact. No acute focal neurologic deficits are appreciated.  Skin:    Skin is warm, dry and intact. No rash noted.  No petechiae, purpura, or bullae.  ____________________________________________    LABS (pertinent positives/negatives) (all labs ordered are listed, but only abnormal  results are displayed) Labs Reviewed  BASIC METABOLIC PANEL - Abnormal; Notable for the following components:      Result Value   Potassium 3.1 (*)    Calcium 8.5 (*)    All other components within normal limits  CBC  TROPONIN I  ETHANOL   ____________________________________________   EKG  Interpreted by me Atrial paced rhythm, rate of 68.  Right axis, normal intervals.  Poor R wave progression in anterior precordial leads.  Normal ST segments and T waves  ____________________________________________    RADIOLOGY  Dg Chest 2 View  Result Date: 08/09/2017 CLINICAL DATA:  Syncopal episode. EXAM: CHEST - 2 VIEW COMPARISON:  04/14/2016 FINDINGS: The heart size and mediastinal contours are within normal limits. Stable appearance dual-chamber pacemaker. There is no evidence of pulmonary edema, consolidation, pneumothorax, nodule or pleural fluid. The visualized skeletal structures are unremarkable. IMPRESSION: No active cardiopulmonary disease. Electronically Signed   By: Aletta Edouard M.D.   On: 08/09/2017 21:07    ____________________________________________   PROCEDURES Procedures  ____________________________________________  DIFFERENTIAL DIAGNOSIS   Non-STEMI, dysrhythmia, dehydration, vagal reaction.  Doubt PE dissection stroke AAA  CLINICAL IMPRESSION / ASSESSMENT AND PLAN / ED COURSE  Pertinent labs  & imaging results that were available during my care of the patient were reviewed by me and considered in my medical decision making (see chart for details).    Patient presents with syncope and chest pain after recovering.  Noted to be cyanotic by his wife at the time.  Significant medical history including CHF and permanent pacemaker.  Also known to have issues with alcohol abuse.  He has been drinking smoking and using marijuana recently which may have contributed to his symptoms.  Given IV fluids for hydration.  Initial EKG chest x-ray and labs all unremarkable without evidence of acute MI, pneumothorax, pneumonia.  Case discussed with the hospitalist for further management.  Clinical Course as of Aug 11 6  Tue Aug 09, 2017  4403 Basic metabolic panel [PS]    Clinical Course User Index [PS] Carrie Mew, MD     ----------------------------------------- 12:14 AM on 08/10/2017 -----------------------------------------  [employer] rep interrogated the pacemaker, notes that the patient did have a 3-hour episode of atrial fibrillation yesterday but no acute events today.  Pacemaker functioning appropriately.  If further information needed, see media tab of chart for scanned report.  Can contact Pacer representative Ted at [phone]  ____________________________________________   FINAL CLINICAL IMPRESSION(S) / ED DIAGNOSES    Final diagnoses:  Syncope, unspecified syncope type  Precordial pain     ED Discharge Orders    None      Portions of this note were generated with dragon dictation software. Dictation errors may occur despite best attempts at proofreading.    Carrie Mew, MD 08/10/17 [phone]

## 2017-08-10 NOTE — Progress Notes (Signed)
Brandon Lopez NAME: Brandon Lopez    MR#:  [id number]  DATE OF BIRTH:  May 17, 1955  SUBJECTIVE:   Patient came in with generalized weakness not feeling well all day yesterday. He had a syncopal episode at home. In the ER pacemaker was interrogated was found to be in three hours of rapid afib. Patient currently feels okay. Heart rate in the 80s. REVIEW OF SYSTEMS:   Review of Systems  Constitutional: Negative for chills, fever and weight loss.  HENT: Negative for ear discharge, ear pain and nosebleeds.   Eyes: Negative for blurred vision, pain and discharge.  Respiratory: Negative for sputum production, shortness of breath, wheezing and stridor.   Cardiovascular: Negative for chest pain, palpitations, orthopnea and PND.  Gastrointestinal: Negative for abdominal pain, diarrhea, nausea and vomiting.  Genitourinary: Negative for frequency and urgency.  Musculoskeletal: Negative for back pain and joint pain.  Neurological: Negative for sensory change, speech change, focal weakness and weakness.  Psychiatric/Behavioral: Negative for depression and hallucinations. The patient is not nervous/anxious.    Tolerating Diet:yes Tolerating PT: ambulatory  DRUG ALLERGIES:   Allergies  Allergen Reactions  . Simvastatin Diarrhea  . Lisinopril Cough    VITALS:  Blood pressure 132/87, pulse 93, temperature 98.1 F (36.7 C), temperature source Oral, resp. rate 20, height 5\' 6"  (1.676 m), weight 80.3 kg (177 lb), SpO2 98 %.  PHYSICAL EXAMINATION:   Physical Exam  GENERAL:  62 y.o.-year-old patient lying in the bed with no acute distress.  EYES: Pupils equal, round, reactive to light and accommodation. No scleral icterus. Extraocular muscles intact.  HEENT: Head atraumatic, normocephalic. Oropharynx and nasopharynx clear.  NECK:  Supple, no jugular venous distention. No thyroid enlargement, no tenderness.  LUNGS: Normal breath sounds  bilaterally, no wheezing, rales, rhonchi. No use of accessory muscles of respiration.  CARDIOVASCULAR: S1, S2 normal. No murmurs, rubs, or gallops.  ABDOMEN: Soft, nontender, nondistended. Bowel sounds present. No organomegaly or mass.  EXTREMITIES: No cyanosis, clubbing or edema b/l.    NEUROLOGIC: Cranial nerves II through XII are intact. No focal Motor or sensory deficits b/l.   PSYCHIATRIC:  patient is alert and oriented x 3.  SKIN: No obvious rash, lesion, or ulcer.   LABORATORY PANEL:  CBC Recent Labs  Lab 08/09/17 2038  WBC 9.6  HGB 15.7  HCT 44.2  PLT 222    Chemistries  Recent Labs  Lab 08/09/17 2038  NA 136  K 3.1*  CL 103  CO2 24  GLUCOSE 96  BUN 10  CREATININE 0.73  CALCIUM 8.5*   Cardiac Enzymes Recent Labs  Lab 08/10/17 1425  TROPONINI <0.03   RADIOLOGY:  Dg Chest 2 View  Result Date: 08/09/2017 CLINICAL DATA:  Syncopal episode. EXAM: CHEST - 2 VIEW COMPARISON:  04/14/2016 FINDINGS: The heart size and mediastinal contours are within normal limits. Stable appearance dual-chamber pacemaker. There is no evidence of pulmonary edema, consolidation, pneumothorax, nodule or pleural fluid. The visualized skeletal structures are unremarkable. IMPRESSION: No active cardiopulmonary disease. Electronically Signed   By: Aletta Edouard M.D.   On: 08/09/2017 21:07   ASSESSMENT AND PLAN:  Brandon Lopez is a 62 y.o. male with a history of CHF COPD who was in his usual state of health until about 7 PM tonight when he was sitting on his porch and suddenly felt very lightheaded.  He was not having any pain at that time.  He tried calling his wife who  came to his side and found him unresponsive and cyanotic appearing.  After several seconds she was able to arouse him but he seemed confused for a period of time.  EMS arrived and noted blood pressure to be low transported to the ED  1. syncopal episode suspected to be due to rapid a fib with RVR -patient's biometric  pacemaker was interrogated in the ER. It was found patient was in rapid defib for three hours. -Lovenox 1 mg per kilogram BID -cardiology consultation. Spoke with Dr. Rockey Situ -patient sees Dr. Virl Axe -cardiac markers negative -electrolytes okay  2. CHF chronic stable diastolic -last EF 55 to 13% on echo of 2016 -continue home meds  3. COPD stable  4. DVT prophylaxis on Lovenox Case discussed with Care Management/Social Worker. Management plans discussed with the patient, family and they are in agreement.  CODE STATUS: full  DVT Prophylaxis: Lovenox  TOTAL TIME TAKING CARE OF THIS PATIENT: 30* minutes.  >50% time spent on counselling and coordination of care  POSSIBLE D/C IN *1-2** DAYS, DEPENDING ON CLINICAL CONDITION.  Note: This dictation was prepared with Dragon dictation along with smaller phrase technology. Any transcriptional errors that result from this process are unintentional.  Fritzi Mandes M.D on 08/10/2017 at 5:34 PM  Between 7am to 6pm - Pager - [phone]  After 6pm go to [email or web address] - password [email or web address] [facility]  Office  [phone]  CC: Primary care physician; Pleas Koch, NPPatient ID: Brandon Lopez, male   DOB: December 10, 1955, 62 y.o.   MRN: [id number]

## 2017-08-11 ENCOUNTER — Telehealth: Payer: Self-pay | Admitting: Internal Medicine

## 2017-08-11 ENCOUNTER — Observation Stay (HOSPITAL_BASED_OUTPATIENT_CLINIC_OR_DEPARTMENT_OTHER)
Admit: 2017-08-11 | Discharge: 2017-08-11 | Disposition: A | Discharge status: Home / Self Care | Payer: PPO | Attending: Physician Assistant | Admitting: Physician Assistant

## 2017-08-11 DIAGNOSIS — I480 Paroxysmal atrial fibrillation: Secondary | ICD-10-CM

## 2017-08-11 DIAGNOSIS — I495 Sick sinus syndrome: Secondary | ICD-10-CM | POA: Diagnosis not present

## 2017-08-11 DIAGNOSIS — F1010 Alcohol abuse, uncomplicated: Secondary | ICD-10-CM | POA: Diagnosis not present

## 2017-08-11 DIAGNOSIS — E782 Mixed hyperlipidemia: Secondary | ICD-10-CM | POA: Diagnosis not present

## 2017-08-11 DIAGNOSIS — I10 Essential (primary) hypertension: Secondary | ICD-10-CM | POA: Diagnosis not present

## 2017-08-11 DIAGNOSIS — I5032 Chronic diastolic (congestive) heart failure: Secondary | ICD-10-CM | POA: Diagnosis not present

## 2017-08-11 DIAGNOSIS — Z950 Presence of cardiac pacemaker: Secondary | ICD-10-CM | POA: Diagnosis not present

## 2017-08-11 DIAGNOSIS — I420 Dilated cardiomyopathy: Secondary | ICD-10-CM | POA: Diagnosis not present

## 2017-08-11 DIAGNOSIS — R55 Syncope and collapse: Secondary | ICD-10-CM | POA: Diagnosis not present

## 2017-08-11 DIAGNOSIS — I5030 Unspecified diastolic (congestive) heart failure: Secondary | ICD-10-CM

## 2017-08-11 DIAGNOSIS — I4891 Unspecified atrial fibrillation: Secondary | ICD-10-CM | POA: Diagnosis not present

## 2017-08-11 LAB — ECHOCARDIOGRAM COMPLETE
Height: 66 in
WEIGHTICAEL: 2734.4 [oz_av]

## 2017-08-11 LAB — MAGNESIUM: MAGNESIUM: 2.3 mg/dL (ref 1.7–2.4)

## 2017-08-11 MED ORDER — METOPROLOL SUCCINATE ER 25 MG PO TB24: 25.0000 mg | ORAL_TABLET | Freq: Every day | ORAL | 2 refills | Status: DC

## 2017-08-11 MED ORDER — RIVAROXABAN 20 MG PO TABS: 20.0000 mg | ORAL_TABLET | Freq: Every day | ORAL | 2 refills | Status: DC

## 2017-08-11 MED ORDER — METOPROLOL SUCCINATE ER 25 MG PO TB24: 25.0000 mg | ORAL_TABLET | Freq: Every day | ORAL | Status: DC

## 2017-08-11 MED ORDER — RIVAROXABAN 20 MG PO TABS
20.0000 mg | ORAL_TABLET | Freq: Every day | ORAL | Status: DC
  Administered 2017-08-11: 20 mg via ORAL
  Filled 2017-08-11: qty 1

## 2017-08-11 MED ORDER — POTASSIUM CHLORIDE CRYS ER 20 MEQ PO TBCR
40.0000 meq | EXTENDED_RELEASE_TABLET | Freq: Once | ORAL | Status: AC
  Administered 2017-08-11: 40 meq via ORAL
  Filled 2017-08-11: qty 2

## 2017-08-11 NOTE — Discharge Instructions (Signed)
Electrocardiography Electrocardiography is a test that records the electrical impulses of the heart. It assesses many aspects of heart health, including:  Heart function.  Heart rhythm.  Heart muscle thickness.  Electrocardiography can be done as a routine part of a physical exam. It can also be done to evaluate symptoms such as severe chest pain and heart palpitations. What happens during the procedure?  Electrocardiography is simple, safe, and painless. No electricity goes through your body during the procedure.  You will be asked to remove your clothes from the waist up and lie on your back for the test.  Sticky patches (electrodes) will be placed on your chest, arms, and legs. The electrodes will be attached by wires to the electrocardiography machine.  You will be asked to relax and lie still for a few seconds while the electrocardiography machine records the electrical activity of your heart. What happens after the procedure?  If electrocardiography is part of a routine physical exam, you may return to normal activities as told by your health care provider.  Your health care provider or a heart doctor (cardiologist) will interpret the recording.  The test result may not be available during your visit. If your test result is not back during the visit, make an appointment with your health care provider to find out the result. It is your responsibility to get your test results. This information is not intended to replace advice given to you by your health care provider. Make sure you discuss any questions you have with your health care provider. Document Released: 01/09/2000 Document Revised: 06/19/2015 Document Reviewed: 05/24/2011 Elsevier Interactive Patient Education  2017 [employer].

## 2017-08-11 NOTE — Progress Notes (Signed)
*  PRELIMINARY RESULTS* Echocardiogram 2D Echocardiogram has been performed.  Sherrie Sport 08/11/2017, 11:03 AM

## 2017-08-11 NOTE — Consult Note (Signed)
Cardiology Consult    Patient ID: Brandon Lopez MRN: [id number], DOB/AGE: 1955-07-18   Admit date: 08/09/2017 Date of Consult: 08/11/2017  Primary Physician: Pleas Koch, NP Primary Cardiologist: Dr. Rockey Situ - new. Requesting Provider: Dr. Carrie Mew  Patient Profile    Brandon Lopez is a 62 y.o. 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 Joni Fears.   Past Medical History   Past Medical History:  Diagnosis Date  . Arthritis   . CHF (congestive heart failure) ([place])   . COPD (chronic obstructive pulmonary disease) ([place])   . Depression   . Diverticulosis    Found during colonoscopy  . Hypertension   . Kidney stones   . 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 Presence of permanent cardiac pacemaker    Biotronik; Dayna Barker DR-T [id number]; placed 08/2012  . Sleep apnea     Past Surgical History:  Procedure Laterality Date  . APPENDECTOMY  age 27  . CARDIAC CATHETERIZATION     7 years ago  . COLONOSCOPY N/A 05/27/2014   Procedure: COLONOSCOPY;  Surgeon: Hulen Luster, MD;  Location: [facility] ENDOSCOPY;  Service: Gastroenterology;  Laterality: N/A;  . INSERT / REPLACE / REMOVE PACEMAKER     3 years ago     Allergies  Allergies  Allergen Reactions  . Simvastatin Diarrhea  . Lisinopril Cough    History of Present Illness   Of note: 08/2012 Biotronik PPM d/t SSS. 06/11/14, per Dr. Caryl Comes, PPM functioning normally and concern for CAD d/t exertional CP. He notes paroxysmal Afib at that time. He also notes discontinuing HCTZ for amlodipine d/t h/o hypokalemia. TTE, EF 55-60% with G1DD.   On 7/16 at [place], the  patient reports sitting on his porch when he felt non-radiating chest pressure without alleviating or aggravating factors. He describes associated sx as SOB without n/v and full body numbness and calling out for his wife. Wife states she found him unresponsive and cyanotic; however, patient denies LOC and states he was conscious but unable to respond. EMS arrived at the scene and transported to the ED d/t hypotension. Patient reports left & non-radiating chest pain starting s/p episode for three hours and now resolved. He also reports that he has had 5-6 episodes of pre-syncope / syncope in the last 4 years, one of which occurred while driving.  In the ED, chest pain rated 3/10. Device interrogation revealed 18 episodes Afib with RVR (total 3 hours Afib) 7/15 in early AM and not associated with the near-syncope / syncopal episode. EKG SR atrial paced, 68 bpm, RAD, poor R wave progression in anterior precordial leads. CXR with no active cardiac disease. He received IVF and was admitted with cardiology consultation.  BP 122/88, HR 68, RR 20, T-98.6, SpO2 95% ORA. K+ 3.1. Ca 8.5.   Since admission, he has reported 3 episodes of diarrhea (2 last PM, 1 today).  CHADS2VASc at least 1-2. Echo pending.  Inpatient Medications    . amLODipine  5 mg Oral Daily  . aspirin EC  81 mg Oral Daily  . atorvastatin  20 mg Oral QPM  . docusate sodium  100 mg Oral BID  .  enoxaparin (LOVENOX) injection  80 mg Subcutaneous Q12H  . fluticasone furoate-vilanterol  1 puff Inhalation Daily  . irbesartan  300 mg Oral Daily   And  . hydrochlorothiazide  25 mg Oral Daily    Family History    Family History  Problem Relation Age of Onset  . Arthritis Mother   . Heart disease Father   . Hyperlipidemia Father   . Hypertension Father   . Cancer Paternal Grandfather    indicated that his mother is deceased. He indicated that his father is deceased. He indicated that his maternal grandmother is deceased. He indicated that  his maternal grandfather is deceased. He indicated that his paternal grandmother is deceased. He indicated that his paternal grandfather is deceased.   Social History    Social History   Socioeconomic History  . Marital status: Single    Spouse name: Not on file  . Number of children: Not on file  . Years of education: Not on file  . Highest education level: Not on file  Occupational History  . Not on file  Social Needs  . Financial resource strain: Not on file  . Food insecurity:    Worry: Not on file    Inability: Not on file  . Transportation needs:    Medical: Not on file    Non-medical: Not on file  Tobacco Use  . Smoking status: Heavy Tobacco Smoker    Packs/day: 2.00    Years: 40.00    Pack years: 80.00    Types: Cigarettes  . Smokeless tobacco: Never Used  Substance and Sexual Activity  . Alcohol use: Yes    Alcohol/week: 7.2 oz    Types: 12 Cans of beer per week  . Drug use: Yes    Types: Marijuana  . Sexual activity: Not on file  Lifestyle  . Physical activity:    Days per week: Not on file    Minutes per session: Not on file  . Stress: Not on file  Relationships  . Social connections:    Talks on phone: Not on file    Gets together: Not on file    Attends religious service: Not on file    Active member of club or organization: Not on file    Attends meetings of clubs or organizations: Not on file    Relationship status: Not on file  . Intimate partner violence:    Fear of current or ex partner: Not on file    Emotionally abused: Not on file    Physically abused: Not on file    Forced sexual activity: Not on file  Other Topics Concern  . Not on file  Social History Narrative  . Not on file     Review of Systems    General:  No chills, fever, night sweats or weight changes.  Cardiovascular:  +++chest pressure, mild dyspnea on exertion. No edema, orthopnea, palpitations, paroxysmal nocturnal dyspnea. Dermatological: No rash,  lesions/masses Respiratory: No cough, mild DOE Urologic: No hematuria, dysuria Abdominal:   No nausea, vomiting, +++diarrhea (x3). No bright red blood per rectum, melena, or hematemesis Neurologic:  +++syncopal episodes and associated numbness. No visual changes, wkns, changes in mental status. All other systems reviewed and are otherwise negative except as noted above.  Physical Exam    Blood pressure 106/76, pulse 92, temperature 98.2 F (36.8 C), temperature source Oral, resp. rate 18, height 5\' 6"  (1.676 m), weight 177 lb (80.3 kg), SpO2 96 %.  General: Pleasant, NAD. Watching  television. Psych: Normal affect. Neuro: Alert and oriented X 3. Moves all extremities spontaneously. HEENT: Normal  Neck: Supple without bruits or JVD. Lungs:  Resp regular and unlabored, R L wheezing but otherwise clear. Heart: RRR no s3, s4, or murmurs. Abdomen: Soft, non-tender, non-distended, BS + x 4.  Extremities: No clubbing, cyanosis or edema. DP/PT/Radials 2+ and equal bilaterally.  Labs    Troponin (Point of Care Test) No results for input(s): TROPIPOC in the last 72 hours. Recent Labs    08/09/17 2038 08/10/17 0836 08/10/17 1425 08/10/17 2026  TROPONINI <0.03 <0.03 <0.03 <0.03   Lab Results  Component Value Date   WBC 9.6 08/09/2017   HGB 15.7 08/09/2017   HCT 44.2 08/09/2017   MCV 94.7 08/09/2017   PLT 222 08/09/2017    Recent Labs  Lab 08/09/17 2038  NA 136  K 3.1*  CL 103  CO2 24  BUN 10  CREATININE 0.73  CALCIUM 8.5*  GLUCOSE 96   Lab Results  Component Value Date   CHOL 148 12/23/2016   HDL 42.00 12/23/2016   LDLCALC 89 12/23/2016   TRIG 83.0 12/23/2016   No results found for: [facility]   Radiology Studies    Dg Chest 2 View  Result Date: 08/09/2017 CLINICAL DATA:  Syncopal episode. EXAM: CHEST - 2 VIEW COMPARISON:  04/14/2016 FINDINGS: The heart size and mediastinal contours are within normal limits. Stable appearance dual-chamber pacemaker. There is no  evidence of pulmonary edema, consolidation, pneumothorax, nodule or pleural fluid. The visualized skeletal structures are unremarkable. IMPRESSION: No active cardiopulmonary disease. Electronically Signed   By: Aletta Edouard M.D.   On: 08/09/2017 21:07    ECG & Cardiac Imaging    TTE 07/23/14 -Left ventricle: The cavity size was normal. Systolic function was   normal. The estimated ejection fraction was in the range of 55%   to 60%. Septal wall motion consistent with conduction   abnormality. Doppler parameters are consistent with abnormal left   ventricular relaxation (grade 1 diastolic dysfunction). - Left atrium: The atrium was normal in size. - Right ventricle: Pacer wire or catheter noted in right ventricle.   Systolic function was normal. - Pulmonary arteries: Systolic pressure was within the normal   range.   Assessment & Plan    1. Pre-Syncope / Syncope with associated Chest Pain / Tightness   - No active chest pain. Troponin (-).  - PPM and h/o paroxysmal Afib.  - Device interrogation shows 18 episodes paroxysmal Afib unrelated to pre-sycope / syncope episode. - 6/16 EF 55-60%. G1DD.  - 7/18 TTE completed - pending results - Stop ASA 81mg  qd. Stop Lovenox. - Start Xarelto 20mg  daily. Continue at home. - Ordered KCl 40mg  tab x1 d/t hypokalemia (3.1). Replete to 4.0. Check BMP tomorrow AM. - Ordered orthostatics prior to discharge. Currently ambulates with assistance. - Continue Amlodipine 5mg  PO qd. Leg edema reported with higher dosage. - Stopped HCTZ d/t hypokalemia as below - if restart, add KCl tab to home dose.  - Consider vasovagal as factor in pre-syncope / syncope sx given pt report of diarrhea x3  2. Afib with RVR, paroxysmal  - Per Dr. Caryl Comes 2016, paroxysmal Afib.  - PPM - Device interrogation shows Afib unrelated to pre-sycope / syncope episode. - Stop ASA 81 mg qd. Stop Lovenox as above. - Start Xarelto 20 mg today to continue at home. - 7/18 TTE completed  and pending results. - Will need Dr. Caryl Comes follow-up scheduled for after discharge.  3. HTN - BP 106/76-132/87. - Intolerant to lisinopril. Continue Ibersartan 300mg  tab po qd. Intolerance to lisinopril. - Stop HCTZ 25mg  tab po qd d/t hypokalemia. If restart HCTZ for home, consider adding KCl tab d/t hypokalemia. - Continue amlodipine 5mg  PO qd. Higher dosage - reported leg edema. - Monitor BP closely.  4. Hypokalemia - K+ 3.1. Replete to 4.0. Ordered KCl 40mg  tab x1. Check BMP tomorrow AM. -  Order placed to check Mg+. -  Stop HCTZ and consider adding KCl tab if restart as home med. HCTZ stopped in past per Dr. Caryl Comes (2016) d/t h/o  hypokalemia.  - Consider alcohol abuse as contributor to hypokalemia.   5. HLD - Intolerance to simvastatin. - Continue lipitor 20mg  po qd.  6. Alcohol Abuse - Ordered CIWA protocol.  - H/o current alcohol abuse (16 beers/day).  7. COPD - 80 pk year current smoker, daily marijuana use. - Per IM  For questions or updates, please contact [place] Please consult [email or web address] for contact info under Cardiology/STEMI.      Dorthula Nettles, PA-C  Pager [phone] 08/11/2017, 7:19 AM

## 2017-08-11 NOTE — Discharge Summary (Signed)
[facility] [place] NAME: Brandon Lopez    MR#:  [id number]  DATE OF BIRTH:  September 17, 1955  DATE OF ADMISSION:  08/09/2017 ADMITTING PHYSICIAN: Harrie Foreman, MD  DATE OF DISCHARGE: 08/10/2017  PRIMARY CARE PHYSICIAN: Pleas Koch, NP    ADMISSION DIAGNOSIS:  Precordial pain [R07.2] Syncope, unspecified syncope type [R55]  DISCHARGE DIAGNOSIS:  Syncope/Near syncope--unclear etio Paroxysmal afib--now on po Xarelto Chronic alcohol abuse  SECONDARY DIAGNOSIS:   Past Medical History:  Diagnosis Date  . Arthritis   . CHF (congestive heart failure) ([place])   . COPD (chronic obstructive pulmonary disease) ([place])   . Depression   . Diverticulosis    Found during colonoscopy  . Hypertension   . Kidney stones   . 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 Presence of permanent cardiac pacemaker    Biotronik; Dayna Barker DR-T [id number]; placed 08/2012  . Sleep apnea     HOSPITAL COURSE:  Mr. Brandon Lopez is a 62 year old gentleman with past medical history of sick sinus syndromeBasinger , pacemaker placed 2014 , paroxysmal atrial fibrillation not on anticoagulation, hypertension, Hyperlipidemia, obstructive sleep apnea , CPAP, 2 pack a day smoker drug abuse with marijuana heavy alcohol abuse, long history of near syncope/syncope, presenting after near syncopal episode  *Near syncope/ syncope etiology unclear -Pacer downloaded in the ER and no arrhythmia at the time of the event -He is having paroxysmal atrial fibrillation but episode did not correlate -Was in sitting position drinking beer. -Pacemaker supposedly placed for sick sinus syndrome  -no s/o dehydration or infection -Prelim showing decreased ejection fraction For low ejection fraction would consider ischemic work-up as outpt. Pt will f/u with Dr Caryl Comes -Would recommend alcohol cessation.   *Atrial fibrillation, Paroxysmal -recommended he stop aspirin and start xarelto per Dr Rockey Situ Would  recommend metoprolol succinate 25 mg daily.  Further titration as an outpatient   *Hypertension In the setting of cardiomyopathy would stop his amlodipine Continue ARB and now started on metoprolol  * Cardiomyopathy Unable to exclude ischemic versus dilated nonischemic We will need ischemic work-up as  Outpatient per pt's request  *alochol abuse Pt advise to stop drinking beer  Overall stable D/c home. Pt agreeable CONSULTS OBTAINED:  Treatment Team:  Minna Merritts, MD  DRUG ALLERGIES:   Allergies  Allergen Reactions  . Simvastatin Diarrhea  . Lisinopril Cough    DISCHARGE MEDICATIONS:   Allergies as of 08/11/2017      Reactions   Simvastatin Diarrhea   Lisinopril Cough      Medication List    STOP taking these medications   amLODipine 5 MG tablet Commonly known as:  NORVASC   aspirin 81 MG tablet     TAKE these medications   albuterol 108 (90 Base) MCG/ACT inhaler Commonly known as:  PROAIR HFA Inhale 2 puffs into the lungs every 6 (six) hours as needed for wheezing or shortness of breath.   atorvastatin 20 MG tablet Commonly known as:  LIPITOR TAKE 1 TABLET BY MOUTH IN THE EVENING   gabapentin 100 MG capsule Commonly known as:  NEURONTIN Take 100 mg by mouth 2 (two) times daily as needed.   loratadine 10 MG tablet Commonly known as:  CLARITIN Take 10 mg by mouth daily as needed for allergies.   metoprolol succinate 25 MG 24 hr tablet Commonly known as:  TOPROL-XL Take 1 tablet (25 mg total) by mouth daily.   rivaroxaban 20 MG Tabs tablet Commonly known as:  XARELTO Take 1 tablet (20 mg total) by mouth daily. Start taking on:  08/12/2017   SYMBICORT 160-4.5 MCG/ACT inhaler Generic drug:  budesonide-formoterol INHALE 2 PUFFS BY MOUTH TWICE DAILY   telmisartan-hydrochlorothiazide 80-25 MG tablet Commonly known as:  MICARDIS HCT TAKE 1 TABLET BY MOUTH ONCE DAILY       If you experience worsening of your admission symptoms, develop  shortness of breath, life threatening emergency, suicidal or homicidal thoughts you must seek medical attention immediately by calling 911 or calling your MD immediately  if symptoms less severe.  You Must read complete instructions/literature along with all the possible adverse reactions/side effects for all the Medicines you take and that have been prescribed to you. Take any new Medicines after you have completely understood and accept all the possible adverse reactions/side effects.   Please note  You were cared for by a hospitalist during your hospital stay. If you have any questions about your discharge medications or the care you received while you were in the hospital after you are discharged, you can call the unit and asked to speak with the hospitalist on call if the hospitalist that took care of you is not available. Once you are discharged, your primary care physician will handle any further medical issues. Please note that NO REFILLS for any discharge medications will be authorized once you are discharged, as it is imperative that you return to your primary care physician (or establish a relationship with a primary care physician if you do not have one) for your aftercare needs so that they can reassess your need for medications and monitor your lab values. Today   SUBJECTIVE   Feels a lot better  VITAL SIGNS:  Blood pressure 111/83, pulse 95, temperature 97.7 F (36.5 C), temperature source Oral, resp. rate 18, height 5\' 6"  (1.676 m), weight 77.5 kg (170 lb 14.4 oz), SpO2 97 %.  I/O:    Intake/Output Summary (Last 24 hours) at 08/11/2017 1151 Last data filed at 08/11/2017 1005 Gross per 24 hour  Intake 240 ml  Output 1 ml  Net 239 ml    PHYSICAL EXAMINATION:  GENERAL:  62 y.o.-year-old patient patient lying in the bed with no acute distress.  EYES: Pupils equal, round, reactive to light and accommodation. No scleral icterus. Extraocular muscles intact.  HEENT: Head atraumatic,  normocephalic. Oropharynx and nasopharynx clear.  NECK:  Supple, no jugular venous distention. No thyroid enlargement, no tenderness.  LUNGS: Normal breath sounds bilaterally, no wheezing, rales,rhonchi or crepitation. No use of accessory muscles of respiration.  CARDIOVASCULAR: S1, S2 normal. No murmurs, rubs, or gallops.  ABDOMEN: Soft, non-tender, non-distended. Bowel sounds present. No organomegaly or mass.  EXTREMITIES: No pedal edema, cyanosis, or clubbing.  NEUROLOGIC: Cranial nerves II through XII are intact. Muscle strength 5/5 in all extremities. Sensation intact. Gait not checked.  PSYCHIATRIC: The patient is alert and oriented x 3.  SKIN: No obvious rash, lesion, or ulcer.   DATA REVIEW:   CBC  Recent Labs  Lab 08/09/17 2038  WBC 9.6  HGB 15.7  HCT 44.2  PLT 222    Chemistries  Recent Labs  Lab 08/09/17 2038 08/11/17 1110  NA 136  --   K 3.1*  --   CL 103  --   CO2 24  --   GLUCOSE 96  --   BUN 10  --   CREATININE 0.73  --   CALCIUM 8.5*  --   MG  --  2.3  Microbiology Results   No results found for this or any previous visit (from the past 240 hour(s)).  RADIOLOGY:  Dg Chest 2 View  Result Date: 08/09/2017 CLINICAL DATA:  Syncopal episode. EXAM: CHEST - 2 VIEW COMPARISON:  04/14/2016 FINDINGS: The heart size and mediastinal contours are within normal limits. Stable appearance dual-chamber pacemaker. There is no evidence of pulmonary edema, consolidation, pneumothorax, nodule or pleural fluid. The visualized skeletal structures are unremarkable. IMPRESSION: No active cardiopulmonary disease. Electronically Signed   By: Aletta Edouard M.D.   On: 08/09/2017 21:07     Management plans discussed with the patient, family and they are in agreement.  CODE STATUS:     Code Status Orders  (From admission, onward)        Start     Ordered   08/10/17 0825  Full code  Continuous     08/10/17 0824    Code Status History    This patient has a current  code status but no historical code status.      TOTAL TIME TAKING CARE OF THIS PATIENT: *40* minutes.    Fritzi Mandes M.D on 08/11/2017 at 11:51 AM  Between 7am to 6pm - Pager - [phone] After 6pm go to [email or web address] - Proofreader  [facility]  Office  [phone]  CC: Primary care physician; Pleas Koch, NP

## 2017-08-11 NOTE — Progress Notes (Signed)
Patient discharged via wheelchair and private vehicle. IV removed and catheter intact. All discharge instructions given and patient verbalizes understanding. Tele removed and returned. No prescriptions given to patient No distress noted.   

## 2017-08-11 NOTE — Telephone Encounter (Signed)
TCM [facility] for Paroxsymal Afib   Scheduled 7/26 with Christell Faith at 130

## 2017-08-11 NOTE — Progress Notes (Signed)
Cardiovascular and Pulmonary Nurse Navigator Note:    62 year old male with PMHx of sick sinus syndrome, s/p pacemaker placement in 2014, paroxysmal atrial fibrillation - not previously on anticoagulation, HTN, HLD, OSA, CPAP, 2 ppd smoker, drug abuse with marijuana, heavy alcohol abuse, long hx of near syncope/syncope who presented to the ED with afer a near syncopal episode.     Patient for discharge today.  Per Dr. Posey Pronto patient's near syncope/syncope episodes are unclear. Echo results pending.  Patient may need an outpatient ischemic workup if EF is low.    Dr. Fritzi Mandes requested this RN to provide patient with Xarelto coupon.  Discussed Xarelto including side effects with patient .  Xarelto coupon / card given to patient.    Roanna Epley, RN, BSN, [facility] Cardiovascular and Pulmonary Nurse Navigator

## 2017-08-11 NOTE — Progress Notes (Signed)
Patient ambulated around nurses station 3 times. No ectopy or symptoms noted during ambulation. Will proceed with discharge

## 2017-08-11 NOTE — Telephone Encounter (Signed)
No answer. Left message to call back.   

## 2017-08-11 NOTE — Plan of Care (Signed)
  Problem: Education: Goal: Knowledge of General Education information will improve Description: Including pain rating scale, medication(s)/side effects and non-pharmacologic comfort measures Outcome: Progressing   Problem: Health Behavior/Discharge Planning: Goal: Ability to manage health-related needs will improve Outcome: Progressing   Problem: Safety: Goal: Ability to remain free from injury will improve Outcome: Progressing   

## 2017-08-12 NOTE — Telephone Encounter (Signed)
Pt returning our call °Please call back ° °

## 2017-08-15 ENCOUNTER — Telehealth: Payer: Self-pay | Admitting: *Deleted

## 2017-08-15 NOTE — Telephone Encounter (Signed)
Lm requesting return call to complete TCM and confirm hosp f/u appt  

## 2017-08-16 NOTE — Telephone Encounter (Signed)
No answer. Left message to call back.   

## 2017-08-18 ENCOUNTER — Encounter: Payer: Self-pay | Admitting: Primary Care

## 2017-08-18 ENCOUNTER — Ambulatory Visit (INDEPENDENT_AMBULATORY_CARE_PROVIDER_SITE_OTHER): Payer: PPO | Admitting: Primary Care

## 2017-08-18 VITALS — BP 118/68 | HR 88 | Temp 98.0°F | Wt 179.8 lb

## 2017-08-18 DIAGNOSIS — R55 Syncope and collapse: Secondary | ICD-10-CM | POA: Diagnosis not present

## 2017-08-18 DIAGNOSIS — F1010 Alcohol abuse, uncomplicated: Secondary | ICD-10-CM | POA: Diagnosis not present

## 2017-08-18 DIAGNOSIS — I10 Essential (primary) hypertension: Secondary | ICD-10-CM | POA: Diagnosis not present

## 2017-08-18 DIAGNOSIS — Z09 Encounter for follow-up examination after completed treatment for conditions other than malignant neoplasm: Secondary | ICD-10-CM

## 2017-08-18 DIAGNOSIS — E876 Hypokalemia: Secondary | ICD-10-CM | POA: Diagnosis not present

## 2017-08-18 LAB — BASIC METABOLIC PANEL
BUN: 9 mg/dL (ref 6–23)
CHLORIDE: 101 meq/L (ref 96–112)
CO2: 30 meq/L (ref 19–32)
CREATININE: 0.7 mg/dL (ref 0.40–1.50)
Calcium: 9.5 mg/dL (ref 8.4–10.5)
GFR: 121.48 mL/min (ref >60.00)
Glucose, Bld: 78 mg/dL (ref 70–99)
POTASSIUM: 3.6 meq/L (ref 3.5–5.1)
Sodium: 138 mEq/L (ref 135–145)

## 2017-08-18 NOTE — Assessment & Plan Note (Signed)
Reduced overall, now drinking 4-5 beers daily. Provided encouragement and also education regarding alcohol cessation.

## 2017-08-18 NOTE — Progress Notes (Signed)
Subjective:    Patient ID: Brandon Lopez, male    DOB: [birth year], 62 y.o.   MRN: [id number]  HPI  Brandon Lopez is a 62 year old male with a history of sick sinus syndrome with pacemaker (2014), paroxysmal atrial fibrillation, hypertension, hyperlipidemia, alcohol abuse, OSA, tobacco abuse, near syncope/syncope who presents today for hospital follow up.  He presented to [facility] ED with a chief complaint of syncope. He was at home on his porch when he reported to his girlfriend that he didn't feel well, he then had a syncopal episode and was found on the floor nearly unresponsive and cyanotic. His pacemaker was interrogated and was found to be in 3 hours of atrial fibrillation with RVR. He was admitted for further monitoring.  His echocardiogram showed significantly reduced ejection fracture when compared to 2016. He was consulted by cardiology who switched him from aspirin to Xarelto and initiated metoprolol succinate 25 mg daily. His Amlodipine was discontinued given cardiomyopathy. He was discharged home on 08/11/17 with recommendations for cardiology follow up and ischemic work up.   BP Readings from Last 3 Encounters:  08/18/17 118/68  08/11/17 (!) 121/96  04/21/17 (!) 152/82   Since his discharge home he's compliant to metoprolol succinate 25 mg and Xarelto 20 mg. He has noticed chest pain intermittently since discharge home. Also some dizziness. This occurs mainly in the late afternoon with rest and exertion that will last minutes to 1.5 hours. He is scheduled to see his cardiologist tomorrow.   He has noticed some decline in short term memory and word finding since his hospital stay. He sometimes feels like he's in a "fog". He denies unilateral weakness, facial drooping, slurred speech.   He is requesting a handicap placard due to shortness of breath and some tachycardia that he will notice intermittently, especially when shopping and walking long distances. He is trying to cut  back on his alcohol consumption and is now down to 4-5 beers daily.   Review of Systems  Constitutional: Negative for fever.  Respiratory: Negative for cough.        Intermittent shortness of breath  Cardiovascular: Positive for chest pain. Negative for palpitations and leg swelling.  Neurological: Positive for dizziness. Negative for syncope and headaches.       Past Medical History:  Diagnosis Date  . Arthritis   . CHF (congestive heart failure) ([place])   . COPD (chronic obstructive pulmonary disease) ([place])   . Depression   . Diverticulosis    Found during colonoscopy  . Hypertension   . Kidney stones   . 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 Presence of permanent cardiac pacemaker    Biotronik; Dayna Barker DR-T [id number]; placed 08/2012  . Sleep apnea      Social History   Socioeconomic History  . Marital status: Single    Spouse name: Not on file  . Number of children: Not on file  . Years of education: Not on file  . Highest education level: Not on file  Occupational History  . Not on file  Social Needs  . Financial resource strain: Not on file  . Food insecurity:    Worry: Not on file    Inability: Not on file  . Transportation needs:    Medical: Not on file    Non-medical: Not on file  Tobacco Use  . Smoking status: Heavy Tobacco Smoker    Packs/day: 2.00    Years: 40.00    Pack years: 80.00  Types: Cigarettes  . Smokeless tobacco: Never Used  Substance and Sexual Activity  . Alcohol use: Yes    Alcohol/week: 7.2 oz    Types: 12 Cans of beer per week  . Drug use: Yes    Types: Marijuana  . Sexual activity: Not on file  Lifestyle  . Physical activity:    Days per week: Not on file    Minutes per session: Not on file  . Stress: Not on file  Relationships  . Social connections:    Talks on phone: Not on file    Gets together: Not on file    Attends religious service: Not on file    Active member of club or organization: Not on file    Attends meetings of clubs or  organizations: Not on file    Relationship status: Not on file  . Intimate partner violence:    Fear of current or ex partner: Not on file    Emotionally abused: Not on file    Physically abused: Not on file    Forced sexual activity: Not on file  Other Topics Concern  . Not on file  Social History Narrative  . Not on file    Past Surgical History:  Procedure Laterality Date  . APPENDECTOMY  age 89  . CARDIAC CATHETERIZATION     7 years ago  . COLONOSCOPY N/A 05/27/2014   Procedure: COLONOSCOPY;  Surgeon: Hulen Luster, MD;  Location: [facility] ENDOSCOPY;  Service: Gastroenterology;  Laterality: N/A;  . INSERT / REPLACE / REMOVE PACEMAKER     3 years ago    Family History  Problem Relation Age of Onset  . Arthritis Mother   . Heart disease Father   . Hyperlipidemia Father   . Hypertension Father   . Cancer Paternal Grandfather     Allergies  Allergen Reactions  . Simvastatin Diarrhea  . Lisinopril Cough    Current Outpatient Medications on File Prior to Visit  Medication Sig Dispense Refill  . albuterol (PROAIR HFA) 108 (90 Base) MCG/ACT inhaler Inhale 2 puffs into the lungs every 6 (six) hours as needed for wheezing or shortness of breath. 18 g 2  . atorvastatin (LIPITOR) 20 MG tablet TAKE 1 TABLET BY MOUTH IN THE EVENING 90 tablet 1  . gabapentin (NEURONTIN) 100 MG capsule Take 100 mg by mouth 2 (two) times daily as needed.     . loratadine (CLARITIN) 10 MG tablet Take 10 mg by mouth daily as needed for allergies.     . metoprolol succinate (TOPROL-XL) 25 MG 24 hr tablet Take 1 tablet (25 mg total) by mouth daily. 30 tablet 2  . rivaroxaban (XARELTO) 20 MG TABS tablet Take 1 tablet (20 mg total) by mouth daily. 30 tablet 2  . SYMBICORT 160-4.5 MCG/ACT inhaler INHALE 2 PUFFS BY MOUTH TWICE DAILY 3 Inhaler 1  . telmisartan-hydrochlorothiazide (MICARDIS HCT) 80-25 MG tablet TAKE 1 TABLET BY MOUTH ONCE DAILY 90 tablet 0   No current facility-administered medications on file prior  to visit.     BP 118/68   Pulse 88   Temp 98 F (36.7 C) (Oral)   Wt 179 lb 12 oz (81.5 kg)   SpO2 97%   BMI 29.01 kg/m    Objective:   Physical Exam  Constitutional: He appears well-nourished.  Neck: Neck supple.  Cardiovascular: Normal rate and regular rhythm.  Respiratory: Effort normal and breath sounds normal.  Skin: Skin is warm and dry.  Psychiatric: He has a  normal mood and affect.           Assessment & Plan:

## 2017-08-18 NOTE — Assessment & Plan Note (Signed)
Stable in the office today. Continue metoprolol succinate 25 mg and telmisartan-HCTZ. Repeat BMP pending for hypokalemia in hospital.

## 2017-08-18 NOTE — Assessment & Plan Note (Signed)
Admitted and found to have been in atrial fibrillation RVR for 3 hours. Now on Xarelto and metoprolol succinate.  Echocardiogram reviewed and decreased LV function.  He will see cardiology tomorrow for potential ischemic work up and follow up.  All hospital notes, labs, imaging reviewed.

## 2017-08-18 NOTE — Patient Instructions (Signed)
Stop by the lab prior to leaving today. I will notify you of your results once received.   Follow up with the cardiologist as scheduled.   It was a pleasure to see you today!

## 2017-08-19 ENCOUNTER — Ambulatory Visit: Payer: PPO | Admitting: Physician Assistant

## 2017-08-19 ENCOUNTER — Encounter: Payer: Self-pay | Admitting: Physician Assistant

## 2017-08-19 VITALS — BP 104/62 | HR 74 | Ht 66.0 in | Wt 179.5 lb

## 2017-08-19 DIAGNOSIS — R001 Bradycardia, unspecified: Secondary | ICD-10-CM | POA: Diagnosis not present

## 2017-08-19 DIAGNOSIS — I5022 Chronic systolic (congestive) heart failure: Secondary | ICD-10-CM

## 2017-08-19 DIAGNOSIS — R55 Syncope and collapse: Secondary | ICD-10-CM

## 2017-08-19 DIAGNOSIS — E876 Hypokalemia: Secondary | ICD-10-CM

## 2017-08-19 DIAGNOSIS — R079 Chest pain, unspecified: Secondary | ICD-10-CM | POA: Diagnosis not present

## 2017-08-19 DIAGNOSIS — I10 Essential (primary) hypertension: Secondary | ICD-10-CM | POA: Diagnosis not present

## 2017-08-19 DIAGNOSIS — I480 Paroxysmal atrial fibrillation: Secondary | ICD-10-CM | POA: Diagnosis not present

## 2017-08-19 DIAGNOSIS — Z720 Tobacco use: Secondary | ICD-10-CM | POA: Diagnosis not present

## 2017-08-19 MED ORDER — RIVAROXABAN 20 MG PO TABS: 20.0000 mg | ORAL_TABLET | Freq: Every day | ORAL | 3 refills | Status: DC

## 2017-08-19 NOTE — Patient Instructions (Addendum)
Medication Instructions:  Your physician recommends that you continue on your current medications as directed. Please refer to the Current Medication list given to you today.   Labwork: none  Testing/Procedures: Your physician has requested that you have a carotid duplex. This test is an ultrasound of the carotid arteries in your neck. It looks at blood flow through these arteries that supply the brain with blood. Allow one hour for this exam. There are no restrictions or special instructions.  Your physician has requested that you have a lexiscan myoview. For further information please visit [email or web address]. Please follow instruction sheet, as given.  Brandon Lopez  Your caregiver has ordered a Stress Test with nuclear imaging. The purpose of this test is to evaluate the blood supply to your heart muscle. This procedure is referred to as a "Non-Invasive Stress Test." This is because other than having an IV started in your vein, nothing is inserted or "invades" your body. Cardiac stress tests are done to find areas of poor blood flow to the heart by determining the extent of coronary artery disease (CAD). Some patients exercise on a treadmill, which naturally increases the blood flow to your heart, while others who are  unable to walk on a treadmill due to physical limitations have a pharmacologic/chemical stress agent called Lexiscan . This medicine will mimic walking on a treadmill by temporarily increasing your coronary blood flow.   Please note: these test may take anywhere between 2-4 hours to complete  PLEASE REPORT TO [place] AT THE FIRST DESK WILL DIRECT YOU WHERE TO GO  Date of Procedure:_____________________________________  Arrival Time for Procedure:______________________________   PLEASE NOTIFY THE OFFICE AT LEAST 24 HOURS IN ADVANCE IF YOU ARE UNABLE TO KEEP YOUR APPOINTMENT.  [phone] AND  PLEASE NOTIFY NUCLEAR MEDICINE AT  [facility] AT LEAST 24 HOURS IN ADVANCE IF YOU ARE UNABLE TO KEEP YOUR APPOINTMENT. [phone]  How to prepare for your Myoview test:  1. Do not eat or drink after midnight 2. No caffeine for 24 hours prior to test 3. No smoking 24 hours prior to test. 4. Your medication may be taken with water.  If your doctor stopped a medication because of this test, do not take that medication. 5. Pants are appropriate. Please wear a short sleeve shirt. 6. No perfume, cologne or lotion. 7. Wear comfortable walking shoes.     Follow-Up: Your physician recommends that you schedule a follow-up appointment in: [place].  If you need a refill on your cardiac medications before your next appointment, please call your pharmacy.     Cardiac Nuclear Scan A cardiac nuclear scan is a test that measures blood flow to the heart when a person is resting and when he or she is exercising. The test looks for problems such as:  Not enough blood reaching a portion of the heart.  The heart muscle not working normally.  You may need this test if:  You have heart disease.  You have had abnormal lab results.  You have had heart surgery or angioplasty.  You have chest pain.  You have shortness of breath.  In this test, a radioactive dye (tracer) is injected into your bloodstream. After the tracer has traveled to your heart, an imaging device is used to measure how much of the tracer is absorbed by or distributed to various areas of your heart. This procedure is usually done at a hospital and takes  2-4 hours. Tell a health care provider about:  Any allergies you have.  All medicines you are taking, including vitamins, herbs, eye drops, creams, and over-the-counter medicines.  Any problems you or family members have had with the use of anesthetic medicines.  Any blood disorders you have.  Any surgeries you have had.  Any medical conditions you have.  Whether you are  pregnant or may be pregnant. What are the risks? Generally, this is a safe procedure. However, problems may occur, including:  Serious chest pain and heart attack. This is only a risk if the stress portion of the test is done.  Rapid heartbeat.  Sensation of warmth in your chest. This usually passes quickly.  What happens before the procedure?  Ask your health care provider about changing or stopping your regular medicines. This is especially important if you are taking diabetes medicines or blood thinners.  Remove your jewelry on the day of the procedure. What happens during the procedure?  An IV tube will be inserted into one of your veins.  Your health care provider will inject a small amount of radioactive tracer through the tube.  You will wait for 20-40 minutes while the tracer travels through your bloodstream.  Your heart activity will be monitored with an electrocardiogram (ECG).  You will lie down on an exam table.  Images of your heart will be taken for about 15-20 minutes.  You may be asked to exercise on a treadmill or stationary bike. While you exercise, your heart's activity will be monitored with an ECG, and your blood pressure will be checked. If you are unable to exercise, you may be given a medicine to increase blood flow to parts of your heart.  When blood flow to your heart has peaked, a tracer will again be injected through the IV tube.  After 20-40 minutes, you will get back on the exam table and have more images taken of your heart.  When the procedure is over, your IV tube will be removed. The procedure may vary among health care providers and hospitals. Depending on the type of tracer used, scans may need to be repeated 3-4 hours later. What happens after the procedure?  Unless your health care provider tells you otherwise, you may return to your normal schedule, including diet, activities, and medicines.  Unless your health care provider tells you  otherwise, you may increase your fluid intake. This will help flush the contrast dye from your body. Drink enough fluid to keep your urine clear or pale yellow.  It is up to you to get your test results. Ask your health care provider, or the department that is doing the test, when your results will be ready. Summary  A cardiac nuclear scan measures the blood flow to the heart when a person is resting and when he or she is exercising.  You may need this test if you are at risk for heart disease.  Tell your health care provider if you are pregnant.  Unless your health care provider tells you otherwise, increase your fluid intake. This will help flush the contrast dye from your body. Drink enough fluid to keep your urine clear or pale yellow. This information is not intended to replace advice given to you by your health care provider. Make sure you discuss any questions you have with your health care provider. Document Released: 02/06/2004 Document Revised: 01/14/2016 Document Reviewed: 12/20/2012 Elsevier Interactive Patient Education  2017 [employer].

## 2017-08-19 NOTE — Progress Notes (Signed)
Cardiology Office Note Date:  08/19/2017  Patient ID:  Brandon Lopez [birth year], MRN [id number] PCP:  Brandon Koch, NP  Cardiologist:  Dr. Rockey Situ, MD Electrophysiologist: Dr. Caryl Comes, MD    Chief Complaint: Hospital follow up  History of Present Illness: Brandon Lopez is a 62 y.o. 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   Patient previously underwent Biotronik PPM implantation at [facility] for dyspnea, presyncope, and syncope in 2014. Myoview from 05/2014 for dyspnea showed no ischemia with an EF of 50%. Follow up echo in 06/2014 showed an EF of 55-60%. Patient was most recently seen in the office in 12/2015 for routine follow up. At that he was noted to have some Afib. Given his CHADS2VASc was only 1 at that time he was not placed on long term anticoagulation. There was also some exertional chest pain noted.  Patient was admitted to [facility] 7/16 for possible syncopal episode that was preceded by chest pressure. Patient indicated however that he did not suffer LOC. Device interrogation showed 18 episodes of Afib with RVR (total 3 hours of Afib). However, there was no arrhythmia or pause noted at the time of the syncopal episode. Cardiac enzymes were negative. CXR not acute. TSH normal. Ethanol < 10. CBC unremarkable. K+ 3.1, SCr 0.73. Echo showed an EF of 40-45%, unable to exclude RWMA, Gr1DD, LA normal in size, RVSF normal, pacer wire noted in the RV, PASP normal. Patient was eager to be discharged. He was started on Xarelto and his ASA was stopped given hsi Afib burden and elevated CHADS2VASc. He was also started on Toprol XL 25 mg daily. Outpatient ischemic evaluation was advised. His amlodipine was stopped given his CM. Discharge  medications included: albuterol, Lipitor, gabapentin, loratadine, Toprol XL, Xarelto, Symbicort, and Mycardis HCT. Discharge weight 170 pounds.   Of note, patient has had several presyncopal/syncopal episodes over the years.   Patient was seen by PCP on 7/25 for hospital follow up and reported compliance with his medications. There was some concern of declination of his short-term memory. He was noted to be down to 4-5 beers daily (prior 18 beers daily). Labs checked by PCP showed Na 138, K+ 3.6, SCr 0.70, glucose 78.   Patient Lopez in doing well today. Reports one episode of chest discomfort the week prior that occurred while at rest and lasted for 1 hour 10 minutes and spontaneously resolved. Since then, he has had a couple further episodes of chest discomfort that will occur at rest, though are only lasting a couple minutes prior to resolving. Currently chest pain free. He is not checking his blood pressure at home. Weight is stable. He states he is going panning for gold on 7/27-7/28, which typically requires him to "move boulders." he has not had any further possible syncopal episodes. He does express concerns over being able to afford Xarelto long term. No falls, BRBPR or melena.   Past Medical History:  Diagnosis Date  . Arthritis   . Chronic systolic CHF (congestive heart failure) ([place])    a. echo 6/16: EF 55-60%, Gr1DD, LA nl, PASP nl; b. echo 7/19: EF 40-45%, unable to exclude RWMA, Gr1DD, LA nl, RVSF nl, PASP nl  . COPD (chronic obstructive pulmonary disease) ([place])   . Depression   .  Diverticulosis    Found during colonoscopy  . Hypertension   . Kidney stones   . PAF (paroxysmal atrial fibrillation) ([place])    a. short episodes previously noted on PPM interrogation; b. started on Xarelto 07/2017 given further episodes of Afib noted during interrogation; c. CHADS2VASc at least 3 (CHF, HTN, vascualr disease with aortic calcifications noted on CT)  . 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 Presence of  permanent cardiac pacemaker    a. Biotronik; Dayna Barker DR-T [id number]; placed 08/2012  . Sleep apnea   . Symptomatic bradycardia    a. s/p Biotronik; Dayna Barker DR-T [id number]; placed 08/2012 at [facility]    Past Surgical History:  Procedure Laterality Date  . APPENDECTOMY  age 33  . CARDIAC CATHETERIZATION     7 years ago  . COLONOSCOPY N/A 05/27/2014   Procedure: COLONOSCOPY;  Surgeon: Hulen Luster, MD;  Location: [facility] ENDOSCOPY;  Service: Gastroenterology;  Laterality: N/A;  . INSERT / REPLACE / REMOVE PACEMAKER     3 years ago    Current Meds  Medication Sig  . albuterol (PROAIR HFA) 108 (90 Base) MCG/ACT inhaler Inhale 2 puffs into the lungs every 6 (six) hours as needed for wheezing or shortness of breath.  Marland Kitchen atorvastatin (LIPITOR) 20 MG tablet TAKE 1 TABLET BY MOUTH IN THE EVENING  . gabapentin (NEURONTIN) 100 MG capsule Take 100 mg by mouth 2 (two) times daily as needed.   . loratadine (CLARITIN) 10 MG tablet Take 10 mg by mouth daily as needed for allergies.   . metoprolol succinate (TOPROL-XL) 25 MG 24 hr tablet Take 1 tablet (25 mg total) by mouth daily.  . rivaroxaban (XARELTO) 20 MG TABS tablet Take 1 tablet (20 mg total) by mouth daily.  . SYMBICORT 160-4.5 MCG/ACT inhaler INHALE 2 PUFFS BY MOUTH TWICE DAILY  . telmisartan-hydrochlorothiazide (MICARDIS HCT) 80-25 MG tablet TAKE 1 TABLET BY MOUTH ONCE DAILY    Allergies:   Simvastatin and Lisinopril   Social History:  The patient  reports that he has been smoking cigarettes.  He has a 80.00 pack-year smoking history. He has never used smokeless tobacco. He reports that he drinks about 7.2 oz of alcohol per week. He reports that he has current or past drug history. Drug: Marijuana.   Family History:  The patient's family history includes Arthritis in his mother; Cancer in his paternal grandfather; Heart disease in his father; Hyperlipidemia in his father; Hypertension in his father.  ROS:   Review of Systems  Constitutional: Positive for  malaise/fatigue. Negative for chills, diaphoresis, fever and weight loss.  HENT: Negative for congestion.   Eyes: Negative for discharge and redness.  Respiratory: Negative for cough, hemoptysis, sputum production, shortness of breath and wheezing.   Cardiovascular: Positive for chest pain. Negative for palpitations, orthopnea, claudication, leg swelling and PND.  Gastrointestinal: Negative for abdominal pain, blood in stool, heartburn, melena, nausea and vomiting.  Genitourinary: Negative for hematuria.  Musculoskeletal: Negative for falls and myalgias.  Skin: Negative for rash.  Neurological: Positive for weakness. Negative for dizziness, tingling, tremors, sensory change, speech change, focal weakness and loss of consciousness.  Endo/Heme/Allergies: Does not bruise/bleed easily.  Psychiatric/Behavioral: Positive for substance abuse. The patient is not nervous/anxious.   All other systems reviewed and are negative.    PHYSICAL EXAM:  VS:  BP 104/62 (BP Location: Left Arm, Patient Position: Sitting, Cuff Size: Normal)   Pulse 74   Ht 5\' 6"  (1.676 m)   Wt 179 lb 8 oz (81.4  kg)   BMI 28.97 kg/m  BMI: Body mass index is 28.97 kg/m.  Physical Exam  Constitutional: He is oriented to person, place, and time. He appears well-developed and well-nourished.  HENT:  Head: Normocephalic and atraumatic.  Eyes: Right eye exhibits no discharge. Left eye exhibits no discharge.  Neck: Normal range of motion. No JVD present.  Cardiovascular: Normal rate, regular rhythm, S1 normal, S2 normal and normal heart sounds. Exam reveals no distant heart sounds, no friction rub, no midsystolic click and no opening snap.  No murmur heard. Pulses:      Posterior tibial pulses are 2+ on the right side, and 2+ on the left side.  Pulmonary/Chest: Effort normal and breath sounds normal. No respiratory distress. He has no decreased breath sounds. He has no wheezes. He has no rales. He exhibits no tenderness.    Abdominal: Soft. He exhibits no distension. There is no tenderness.  Musculoskeletal: He exhibits no edema.  Neurological: He is alert and oriented to person, place, and time.  Skin: Skin is warm and dry. No cyanosis. Nails show no clubbing.  Psychiatric: He has a normal mood and affect. His speech is normal and behavior is normal. Judgment and thought content normal.     EKG:  Was ordered and interpreted by me today. Shows paced, 74 bpm  Recent Labs: 12/23/2016: ALT 20 08/09/2017: Hemoglobin 15.7; Platelets 222 08/10/2017: TSH 1.288 08/11/2017: Magnesium 2.3 08/18/2017: BUN 9; Creatinine, Ser 0.70; Potassium 3.6; Sodium 138  12/23/2016: Cholesterol 148; HDL 42.00; LDL Cholesterol 89; Total CHOL/HDL Ratio 4; Triglycerides 83.0; VLDL 16.6   Estimated Creatinine Clearance: 97.1 mL/min (by C-G formula based on SCr of 0.7 mg/dL).   Wt Readings from Last 3 Encounters:  08/19/17 179 lb 8 oz (81.4 kg)  08/18/17 179 lb 12 oz (81.5 kg)  08/10/17 170 lb 14.4 oz (77.5 kg)     Other studies reviewed: Additional studies/records reviewed today include: summarized above  ASSESSMENT AND PLAN:  1. Chronic systolic CHF: He does not appear volume up at this time. Schedule stress testing as below to evaluate for ischemia. Cannot rule out a tachy-mediated cardiomyopathy vs alcohol cardiomyopathy. Complete cessation of alcohol is advised. Continue Toprol and Micardis. Plan to add low dose spironolactone following ischemic evaluation. CHF education.   2. PAF: Currently paced. Continue Toprol XL 25 mg daily for rate control. Continue Xarelto 20 mg q dinner. CHADS2VASc at least 3 (CHF, HTN, vascular disease). Patient assistance paperwork has been given to the patient to see if he qualifies for reduced cost of Xarelto. He may need to contact his health insurance provider to see if they cover Eliquis.   3. Chest pain with moderate risk for cardiac etiology: Currently, chest pain free. Schedule Lexiscan Myoview  to evaluate for high risk ischemia. No treadmill at this time given Afib as well as reported possible syncope. On Xarelto in place of ASA. Advised patient to avoid gold panning until his ischemic workup is complete.   4. Symptomatic bradycardia s/p Biotronik PPM: Followed by EP.   5. Possible syncope: No further episodes. PPM interrogation in the hospital did not demonstrate any significant arrhythmia or pause at the time of his episode. Uncertain etiology at this time. Check carotid artery ultrasound. Consider dedicated head imaging if felt indicated. Recommend fasting lipid panel in follow up.   6. HTN: Blood pressure is well controlled today. Continue Toprol XL 25 mg daily and Micardis HCT 80/25 mg daily.   7. Hypokalemia: Improved on recheck bmet  drawn on 7/25.   8. Polysubstance abuse: Complete cessation is advised.   9. OSA: CPAP.  Disposition: F/u with Dr. Rockey Lopez or an APP in 3 months.   Current medicines are reviewed at length with the patient today.  The patient did not have any concerns regarding medicines.  Signed, Christell Faith, PA-C 08/19/2017 1:27 PM     [address], [place] [address] [phone]

## 2017-08-19 NOTE — Telephone Encounter (Signed)
Left a message for the patient to call back. Patient has an appointment today 7/26 at 1:30.

## 2017-08-22 ENCOUNTER — Other Ambulatory Visit: Payer: Self-pay | Admitting: Primary Care

## 2017-08-22 DIAGNOSIS — G8929 Other chronic pain: Secondary | ICD-10-CM

## 2017-08-22 DIAGNOSIS — M542 Cervicalgia: Principal | ICD-10-CM

## 2017-08-26 ENCOUNTER — Encounter
Admission: RE | Admit: 2017-08-26 | Discharge: 2017-08-26 | Disposition: A | Discharge status: Home / Self Care | Payer: PPO | Source: Ambulatory Visit | Attending: Physician Assistant | Admitting: Physician Assistant

## 2017-08-26 DIAGNOSIS — R079 Chest pain, unspecified: Secondary | ICD-10-CM | POA: Diagnosis not present

## 2017-08-26 DIAGNOSIS — R55 Syncope and collapse: Secondary | ICD-10-CM | POA: Insufficient documentation

## 2017-08-26 LAB — NM MYOCAR MULTI W/SPECT W/WALL MOTION / EF
CHL CUP NUCLEAR SSS: 1
CHL CUP RESTING HR STRESS: 65 {beats}/min
CSEPHR: 47 %
LV dias vol: 131 mL (ref 62–150)
LV sys vol: 41 mL
Peak HR: 75 {beats}/min
SDS: 0
SRS: 10
TID: 1.03

## 2017-08-26 MED ORDER — TECHNETIUM TC 99M TETROFOSMIN IV KIT
31.7230 | PACK | Freq: Once | INTRAVENOUS | Status: AC | PRN
  Administered 2017-08-26: 31.723 via INTRAVENOUS

## 2017-08-26 MED ORDER — REGADENOSON 0.4 MG/5ML IV SOLN
0.4000 mg | Freq: Once | INTRAVENOUS | Status: AC
  Administered 2017-08-26: 0.4 mg via INTRAVENOUS

## 2017-08-26 MED ORDER — TECHNETIUM TC 99M TETROFOSMIN IV KIT
14.1900 | PACK | Freq: Once | INTRAVENOUS | Status: AC | PRN
  Administered 2017-08-26: 14.19 via INTRAVENOUS

## 2017-08-31 ENCOUNTER — Ambulatory Visit (INDEPENDENT_AMBULATORY_CARE_PROVIDER_SITE_OTHER): Payer: PPO

## 2017-08-31 ENCOUNTER — Telehealth: Payer: Self-pay | Admitting: Cardiovascular Disease

## 2017-08-31 DIAGNOSIS — R55 Syncope and collapse: Secondary | ICD-10-CM | POA: Diagnosis not present

## 2017-08-31 NOTE — Telephone Encounter (Signed)
I left a message for the patient to call. 

## 2017-08-31 NOTE — Telephone Encounter (Signed)
Pt is returning your call

## 2017-08-31 NOTE — Telephone Encounter (Signed)
No answer. Left message to call back.   

## 2017-08-31 NOTE — Telephone Encounter (Signed)
I spoke with the patient. He states first thing in the morning he has been feeling ok. He takes his morning meds ~ 9 am-10 am, then about 1 hour later he starts to feel fatigue and some SOB.  Home BP readings have been ~ 110/60.  He feels they are a little higher in the office ~ 128/80.  He has not had noticed any low readings in general.  He has a PPM. He states that he discussed his symptoms with Thurmond Butts, PA at his stress test.  He is concerned that his stress test was fine and he is feeling this way. He does confirm he is feeling a little better today.  I reviewed the patient's medications with him. He states he takes ALL of his medications at the same time every morning as he has a tendency to miss the night time doses.  I advised him that his symptoms are most likely related to his medications and that we may need to either cut the metoprolol in half or have him separate it out and take it at night. He is aware that symptoms could potentially be related to anemia, but I think this is less likely as his hemoglobin was normal mid July.  I advised I will review with Thurmond Butts, PA and call him back.   Reviewed with Thurmond Butts, PA and he recommends that the patient take his metoprolol at night. If he is not agreeable to taking this at night, then take a 1/2 a tablet (12.5 mg) of metoprolol succinate in the mornings.   I attempted to call the patient. After 1 ring his voice mail picked up. I advised I would try to call back later.

## 2017-08-31 NOTE — Telephone Encounter (Signed)
I spoke with the patient. He is aware of Thurmond Butts, PA's recommendation to take metoprolol succinate 25 mg once daily at night, however, if he finds that he is forgetting the doses or if this is too difficult for him, then he is aware to take metoprolol succinate 25 mg- 1/2 tablet (12.5 mg) once daily in the AM.  The patient voices understanding and states he will try to take the metoprolol succinate 25 mg once daily at night. I have advised if neither of those options work for him and he is still having symptoms, to call back and let us know, but to give it a few days to start with.   He is agreeable with all of the above.

## 2017-08-31 NOTE — Telephone Encounter (Signed)
Pt c/o medication issue:  1. Name of Medication: Metoprolol and or Xarelto   2. How are you currently taking this medication (dosage and times per day)? Take both in the morning   3. Are you having a reaction (difficulty breathing--STAT)? No   4. What is your medication issue? Most mornings he is okay, but within taking the medications he's states he feels exhausted and tired. Even at one point it has made him SOB. He is not sure if he's to take it at night. He is taking them with food. He is not sure which medication could be causing this  Would like a call back about this  Please advise

## 2017-09-04 ENCOUNTER — Other Ambulatory Visit: Payer: Self-pay | Admitting: Primary Care

## 2017-09-04 DIAGNOSIS — E785 Hyperlipidemia, unspecified: Secondary | ICD-10-CM

## 2017-09-05 NOTE — Telephone Encounter (Signed)
Left voicemail message to call back  

## 2017-09-05 NOTE — Telephone Encounter (Signed)
Patient calling back to let us know he went from being constipated to having loose bowels .  Patient says yesterday he had 2 episodes of incontinence/leakage  of stool when he didn't even have to go and this has never happened .

## 2017-09-06 MED ORDER — CARVEDILOL 3.125 MG PO TABS: 3.1250 mg | ORAL_TABLET | Freq: Two times a day (BID) | ORAL | 3 refills | Status: DC

## 2017-09-06 NOTE — Telephone Encounter (Signed)
No answer. Left message to call back.   

## 2017-09-06 NOTE — Addendum Note (Signed)
Addended by: Vanessa Ralphs on: 09/06/2017 01:08 PM   Modules accepted: Orders

## 2017-09-06 NOTE — Telephone Encounter (Signed)
S/w patient. He is agreeable to stop Toprol and start carvedilol 3.125 mg two times a day. He verbalized understanding. Patient appreciative and will let us know if any other questions or concerns arise.

## 2017-09-06 NOTE — Telephone Encounter (Signed)
Patient returning call.

## 2017-09-06 NOTE — Telephone Encounter (Signed)
Spoke with patient and he reports bowel changes since hospital stay and he feels it is due to the medications. He states that he mentioned this during his stress test to provider. Reviewed that I have not heard of these particular side effects but that I would alert provider to his concerns. Inquired if he had checked with his PCP on this and he states that he has not because medications were started by [place]. Advised he may want to consider contacting them for further work up as well but that I would route this over to provider and would call back if any further recommendations. He verbalized understanding with no further questions.

## 2017-09-06 NOTE — Telephone Encounter (Signed)
Toprol XL can lead to both some constipation and diarrhea. However, given his cardiomyopathy, we would prefer not to use a calcium channel blocker for rate control. His Toprol XL is being used for both rate control (Afib) as well as for his systolic CHF. We can change him over to Coreg 3.125 mg bid, if he would like, in place of Toprol and see if the diarrhea improves.

## 2017-09-06 NOTE — Telephone Encounter (Signed)
Left voicemail message to call back  

## 2017-09-13 ENCOUNTER — Ambulatory Visit (INDEPENDENT_AMBULATORY_CARE_PROVIDER_SITE_OTHER): Payer: PPO | Admitting: *Deleted

## 2017-09-13 DIAGNOSIS — I495 Sick sinus syndrome: Secondary | ICD-10-CM | POA: Diagnosis not present

## 2017-09-13 DIAGNOSIS — R001 Bradycardia, unspecified: Secondary | ICD-10-CM

## 2017-09-13 LAB — CUP PACEART REMOTE DEVICE CHECK
Brady Statistic AP VP Percent: 1 %
Brady Statistic AP VS Percent: 79 %
Brady Statistic AS VS Percent: 15 %
Brady Statistic RV Percent Paced: 1 %
Date Time Interrogation Session: 20190927051937
Implantable Lead Implant Date: 20140801
Implantable Lead Implant Date: 20140801
Implantable Lead Location: 753859
Implantable Lead Serial Number: 29392661
Lead Channel Impedance Value: 506 Ohm
Lead Channel Pacing Threshold Amplitude: 0.7 V
Lead Channel Pacing Threshold Pulse Width: 0.4 ms
Lead Channel Setting Pacing Amplitude: 2.4 V
Lead Channel Setting Sensing Sensitivity: 2.5 mV
MDC IDC LEAD LOCATION: 753860
MDC IDC LEAD SERIAL: 29356730
MDC IDC MSMT BATTERY REMAINING PERCENTAGE: 60 %
MDC IDC MSMT LEADCHNL RA IMPEDANCE VALUE: 513 Ohm
MDC IDC PG IMPLANT DT: 20140801
MDC IDC SET LEADCHNL RV PACING PULSEWIDTH: 0.4 ms
MDC IDC STAT BRADY AS VP PERCENT: 0 %
MDC IDC STAT BRADY RA PERCENT PACED: 81 %
Pulse Gen Serial Number: 68053398

## 2017-09-13 NOTE — Progress Notes (Signed)
Remote pacemaker transmission.   

## 2017-10-06 ENCOUNTER — Ambulatory Visit (INDEPENDENT_AMBULATORY_CARE_PROVIDER_SITE_OTHER): Payer: PPO | Admitting: Internal Medicine

## 2017-10-06 ENCOUNTER — Encounter: Payer: Self-pay | Admitting: Internal Medicine

## 2017-10-06 VITALS — BP 129/79 | HR 74 | Ht 66.0 in | Wt 180.2 lb

## 2017-10-06 DIAGNOSIS — I480 Paroxysmal atrial fibrillation: Secondary | ICD-10-CM

## 2017-10-06 DIAGNOSIS — R001 Bradycardia, unspecified: Secondary | ICD-10-CM

## 2017-10-06 DIAGNOSIS — Z950 Presence of cardiac pacemaker: Secondary | ICD-10-CM | POA: Diagnosis not present

## 2017-10-06 DIAGNOSIS — I5022 Chronic systolic (congestive) heart failure: Secondary | ICD-10-CM

## 2017-10-06 MED ORDER — TELMISARTAN-HCTZ 40-12.5 MG PO TABS: ORAL_TABLET | ORAL | 3 refills | Status: DC

## 2017-10-06 NOTE — Progress Notes (Signed)
Patient Care Team: Pleas Koch, NP as PCP - General (Nurse Practitioner)   HPI  Brandon Lopez is a 62 y.o. male Seen in follow-up for pacemaker implanted at [facility] for dyspnea and presyncope. Also hx of Afib, but duration < 12 hrs, I had Rx has Subclinical Atrial fibrillation present  But was started on anticoagulation at last hospitalization  When seen 5/16 he had complaints of dyspnea on exertion with exertional chest discomfort.  Records and Results Reviewed DATE TEST    6/16    Echo   EF 55-60 %  normal  5/16    Myoview   EF 50 %   No ischemia Echo done to clarify EF   7/19  Echo  EF 40-45%     Hospitalized 7/19 for an unusual episode where he had some abdominal discomfort tingling in his chest and not feeling right.  EMS was called.  Notes not available.  There was a comment about unresponsive and cyanotic.  The day before he had had atrial fibrillation for about 6 hours.   Because of the atrial fibrillation he was started on anticoagulation and his aspirin was discontinued.  Having some lightheadedness with standing  Hospital records reviewed   Past Medical History:  Diagnosis Date  . Arthritis   . Chronic systolic CHF (congestive heart failure) ([place])    a. echo 6/16: EF 55-60%, Gr1DD, LA nl, PASP nl; b. echo 7/19: EF 40-45%, unable to exclude RWMA, Gr1DD, LA nl, RVSF nl, PASP nl  . COPD (chronic obstructive pulmonary disease) ([place])   . Depression   . Diverticulosis    Found during colonoscopy  . Hypertension   . Kidney stones   . PAF (paroxysmal atrial fibrillation) ([place])    a. short episodes previously noted on PPM interrogation; b. started on Xarelto 07/2017 given further episodes of Afib noted during interrogation; c. CHADS2VASc at least 3 (CHF, HTN, vascualr disease with aortic calcifications noted on CT)  . 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 Presence of permanent cardiac pacemaker    a. Biotronik; Dayna Barker DR-T [id number]; placed 08/2012  . Sleep apnea   .  Symptomatic bradycardia    a. s/p Biotronik; Dayna Barker DR-T [id number]; placed 08/2012 at [facility]    Past Surgical History:  Procedure Laterality Date  . APPENDECTOMY  age 19  . CARDIAC CATHETERIZATION     7 years ago  . COLONOSCOPY N/A 05/27/2014   Procedure: COLONOSCOPY;  Surgeon: Hulen Luster, MD;  Location: [facility] ENDOSCOPY;  Service: Gastroenterology;  Laterality: N/A;  . INSERT / REPLACE / REMOVE PACEMAKER     3 years ago    Current Outpatient Medications  Medication Sig Dispense Refill  . albuterol (PROAIR HFA) 108 (90 Base) MCG/ACT inhaler Inhale 2 puffs into the lungs every 6 (six) hours as needed for wheezing or shortness of breath. 18 g 2  . atorvastatin (LIPITOR) 20 MG tablet TAKE 1 TABLET BY MOUTH IN THE EVENING 90 tablet 1  . carvedilol (COREG) 3.125 MG tablet Take 1 tablet (3.125 mg total) by mouth 2 (two) times daily. 180 tablet 3  . gabapentin (NEURONTIN) 100 MG capsule Take 100 mg by mouth 2 (two) times daily as needed.     . loratadine (CLARITIN) 10 MG tablet Take 10 mg by mouth daily as needed for allergies.     . SYMBICORT 160-4.5 MCG/ACT inhaler INHALE 2 PUFFS BY MOUTH TWICE DAILY 3 Inhaler 1  . telmisartan-hydrochlorothiazide (MICARDIS HCT) 40-12.5 MG tablet  Take 1 tablet (40/12.5 mg) by mouth once daily 90 tablet 3   No current facility-administered medications for this visit.     Allergies  Allergen Reactions  . Simvastatin Diarrhea  . Lisinopril Cough      Review of Systems negative except from HPI and PMH  Physical Exam BP 129/79 (BP Location: Right Arm, Patient Position: Sitting, Cuff Size: Normal)   Pulse 74   Ht 5\' 6"  (1.676 m)   Wt 180 lb 4 oz (81.8 kg)   BMI 29.09 kg/m  Well developed and nourished in no acute distress HENT normal Neck supple with JVP-flat Clear Regular rate and rhythm, no murmurs or gallops Abd-soft with active BS No Clubbing cyanosis edema Skin-warm and dry A & Oriented  Grossly normal sensory and motor function   1 ECG  demonstrates atrial pacing at 74 Intervals 25/09/38  Device was interrogated and demonstrated atrial fibrillation 7/15 i.e. the day before presentation with of 6-8 hours duration.  Episodes were shorter because of undersensing  Assessment and  Plan  Sinus bradycardia  Pacemaker-Biotronik The patient's device was interrogated and the information was fully reviewed.  The device was reprogrammed to increase the upper rate limit so as to achieve a broader heart rate excursion  Atrial fibrillation-paroxysmal SCAF  Dyspnea on exertion  Chest pain consistent with angina  Hypertension  Cardiomyopathy-mild  Orthostatic lightheadedness Blood pressure well controlled  Heart rate excursion controlled by his pacemaker. \ Subclinical atrial fibrillation of less than 12 hours duration (in the less than 24 hours duration based on ASSERT) I do not think warrants anticoagulation in a patient without prior stroke.  Hence, I recommended that we discontinue his Xarelto; he should not resume his aspirin.  With orthostatic lightheadedness and relatively low blood pressure, we will decrease his Micardis--80/25---40/12.5.  With his mild cardiomyopathy we will continue his carvedilol     Current medicines are reviewed at length with the patient today .  The patient does not  have concerns regarding medicines.

## 2017-10-06 NOTE — Patient Instructions (Signed)
Medication Instructions: - Your physician has recommended you make the following change in your medication:   1) STOP xarelto 2) DECREASE micardis/hct (telmisartan/hct) 40/12.5 mg- take 1 tablet by mouth once daily  Labwork: - none ordered  Procedures/Testing: - none ordered  Follow-Up: - Remote monitoring is used to monitor your Pacemaker of ICD from home. This monitoring reduces the number of office visits required to check your device to one time per year. It allows [place] to keep an eye on the functioning of your device to ensure it is working properly. You are scheduled for a device check from home on 12/13/17. You may send your transmission at any time that day. If you have a wireless device, the transmission will be sent automatically. After your physician reviews your transmission, you will receive a postcard with your next transmission date.  - Your physician wants you to follow-up in: 1 year with Dr. Caryl Comes. You will receive a reminder letter in the mail two months in advance. If you don't receive a letter, please call our office to schedule the follow-up appointment.   Any Additional Special Instructions Will Be Listed Below (If Applicable).     If you need a refill on your cardiac medications before your next appointment, please call your pharmacy.

## 2017-10-31 DIAGNOSIS — I480 Paroxysmal atrial fibrillation: Secondary | ICD-10-CM | POA: Insufficient documentation

## 2017-10-31 NOTE — Progress Notes (Signed)
Cardiology Office Note  Date:  11/01/2017   ID:  Brandon Lopez, DOB [birth year], MRN [id number]  PCP:  Brandon Koch, NP   Chief Complaint  Patient presents with  . other    F/u carotid and myoview c/o dizziness and cold sweats. Meds reviewed verbally with pt.    HPI:  Brandon Lopez is a 62 y.o. male past medical history of Cardiac cath >10 yr ago, 20% somewhere COPD, smoker 2 ppd Hyperlipidemia pacemaker implanted at [facility] for dyspnea and presyncope.   Afib, but duration < 12 hrs, started on anticoagulation at last hospitalization  Who presents to establish care with me in the office for symptoms of chest pain, atrial fibrillation, shortness of breath  Echocardiogram July 2019 - Left ventricle: The cavity size was normal. Systolic function was   mildly to moderately reduced. The estimated ejection fraction was   in the range of 40% to 45%. Regional wall motion abnormalities   cannot be excluded. Doppler parameters are consistent with   abnormal left ventricular relaxation (grade 1 diastolic   dysfunction). - Left atrium: The atrium was normal in size. - Right ventricle: Pacer wire or catheter noted in right ventricle.   Systolic function was normal. - Pulmonary arteries: Systolic pressure was within the normal   range.  Stress test August 2019 Pharmacological myocardial perfusion imaging study with no significant  Ischemia Small region mild fixed defect apical wall on attenuation corrected images, unable to exclude processing error No apical defect noted on non-attenuation corrected images Non-attenuation corrected images with diaphragmatic attenuation/mild fixed decreased inferior wall perfusion. Defect resolved with attenuation correction Normal wall motion, EF estimated at 57% No EKG changes concerning for ischemia at peak stress or in recovery. Low risk scan  Off blood thinners, per Dr. Michaele Offer fine with some chronic shortness of breath,  stable Able to exert himself without significant chest pain Long discussion concerning results above  CT scan abdomen pulled up he has moderate aortic atherosclerosis, iliac artery disease, SFA disease  EKG personally reviewed by myself on todays visit Shows paced rhythm rate 79 bpm PVC  Other past medical history reviewed Hospitalized 7/19 for an unusual episode where he had some abdominal discomfort tingling in his chest and not feeling right.  EMS was called.  Notes not available.  There was a comment about unresponsive and cyanotic.  The day before he had had atrial fibrillation for about 6 hours.    PMH:   has a past medical history of Arthritis, Chronic systolic CHF (congestive heart failure) ([place]), COPD (chronic obstructive pulmonary disease) ([place]), Depression, Diverticulosis, Hypertension, Kidney stones, PAF (paroxysmal atrial fibrillation) ([place]), 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  PSH:    Past Surgical History:  Procedure Laterality Date  . APPENDECTOMY  age 1  . CARDIAC CATHETERIZATION     7 years ago  . COLONOSCOPY N/A 05/27/2014   Procedure: COLONOSCOPY;  Surgeon: Hulen Luster, MD;  Location: [facility] ENDOSCOPY;  Service: Gastroenterology;  Laterality: N/A;  . INSERT / REPLACE / REMOVE PACEMAKER     3 years ago    Current Outpatient Medications  Medication Sig Dispense Refill  . albuterol (PROAIR HFA) 108 (90 Base) MCG/ACT inhaler Inhale 2 puffs into the lungs every 6 (six) hours as needed for wheezing or shortness of breath. 18 g 2  . atorvastatin (LIPITOR) 20 MG tablet TAKE 1 TABLET BY MOUTH IN THE EVENING 90 tablet 1  .  carvedilol (COREG) 3.125 MG tablet Take 1 tablet (3.125 mg total) by mouth 2 (two) times daily. 180 tablet 3  . gabapentin (NEURONTIN) 100 MG capsule Take 100 mg by mouth 2 (two) times daily as needed.     . loratadine (CLARITIN) 10 MG tablet Take 10 mg by mouth daily as needed for  allergies.     . SYMBICORT 160-4.5 MCG/ACT inhaler INHALE 2 PUFFS BY MOUTH TWICE DAILY 3 Inhaler 1  . telmisartan-hydrochlorothiazide (MICARDIS HCT) 40-12.5 MG tablet Take 1 tablet (40/12.5 mg) by mouth once daily 90 tablet 3   No current facility-administered medications for this visit.      Allergies:   Simvastatin and Lisinopril   Social History:  The patient  reports that he has been smoking cigarettes. He has a 80.00 pack-year smoking history. He has never used smokeless tobacco. He reports that he drinks about 12.0 standard drinks of alcohol per week. He reports that he has current or past drug history. Drug: Marijuana.   Family History:   family history includes Arthritis in his mother; Cancer in his paternal grandfather; Heart disease in his father; Hyperlipidemia in his father; Hypertension in his father.    Review of Systems: Review of Systems  Constitutional: Negative.   Respiratory: Positive for cough and shortness of breath.   Cardiovascular: Negative.   Gastrointestinal: Negative.   Musculoskeletal: Negative.   Neurological: Negative.   Psychiatric/Behavioral: Negative.   All other systems reviewed and are negative.    PHYSICAL EXAM: VS:  BP (!) 137/95 (BP Location: Left Arm, Patient Position: Sitting, Cuff Size: Normal)   Pulse 79   Ht 5\' 6"  (1.676 m)   Wt 182 lb 4 oz (82.7 kg)   BMI 29.42 kg/m  , BMI Body mass index is 29.42 kg/m. GEN: Well nourished, well developed, in no acute distress  HEENT: normal  Neck: no JVD, carotid bruits, or masses Cardiac: RRR; no murmurs, rubs, or gallops,no edema  Respiratory: Mildly decreased breath sounds throughout,  normal work of breathing GI: soft, nontender, nondistended, + BS MS: no deformity or atrophy  Skin: warm and dry, no rash Neuro:  Strength and sensation are intact Psych: euthymic mood, full affect   Recent Labs: 12/23/2016: ALT 20 08/09/2017: Hemoglobin 15.7; Platelets 222 08/10/2017: TSH  1.288 08/11/2017: Magnesium 2.3 08/18/2017: BUN 9; Creatinine, Ser 0.70; Potassium 3.6; Sodium 138    Lipid Panel Lab Results  Component Value Date   CHOL 148 12/23/2016   HDL 42.00 12/23/2016   LDLCALC 89 12/23/2016   TRIG 83.0 12/23/2016      Wt Readings from Last 3 Encounters:  11/01/17 182 lb 4 oz (82.7 kg)  10/06/17 180 lb 4 oz (81.8 kg)  08/19/17 179 lb 8 oz (81.4 kg)       ASSESSMENT AND PLAN:  Chronic obstructive pulmonary disease, unspecified COPD type ([place]) We have encouraged him to continue to work on weaning his cigarettes and smoking cessation. He will continue to work on this and does not want any assistance with chantix.   Paroxysmal atrial fibrillation (HCC) - Plan: EKG 12-Lead Followed by Dr. Caryl Comes, denies any tachycardia or palpitations He is off anticoagulation  Essential hypertension Mild orthostasis drop 10 points, blood pressure stable in the 120s when he is standing No medication changes made  Pacemaker Followed by Caryl Comes  Edema, unspecified type No significant edema on today's visit  Cigarette smoker Again discussed with him, he is trying to quit PAD images discussed with him  Shortness of  breath Unable to exclude underlying ischemia, long discussion with him He does have mildly depressed ejection fraction on echocardiogram, this was discussed Grossly normal stress test but some artifact We would have low threshold for cardiac catheterization in the future if he has worsening shortness of breath fatigue chest pain   Disposition:   F/U  12 months  Long discussion concerning his echocardiogram, stress test, CT scan abdomen with images pulled up, discussed cardiac catheterization risk and benefit  Total encounter time more than 35 minutes  Greater than 50% was spent in counseling and coordination of care with the patient    Orders Placed This Encounter  Procedures  . EKG 12-Lead     Signed, Esmond Plants, M.D., Ph.D. 11/01/2017  [place], [place]

## 2017-11-01 ENCOUNTER — Encounter: Payer: Self-pay | Admitting: Cardiovascular Disease

## 2017-11-01 ENCOUNTER — Ambulatory Visit (INDEPENDENT_AMBULATORY_CARE_PROVIDER_SITE_OTHER): Payer: PPO | Admitting: Cardiovascular Disease

## 2017-11-01 VITALS — BP 137/95 | HR 79 | Ht 66.0 in | Wt 182.2 lb

## 2017-11-01 DIAGNOSIS — R609 Edema, unspecified: Secondary | ICD-10-CM

## 2017-11-01 DIAGNOSIS — I700 Atherosclerosis of aorta: Secondary | ICD-10-CM

## 2017-11-01 DIAGNOSIS — I10 Essential (primary) hypertension: Secondary | ICD-10-CM

## 2017-11-01 DIAGNOSIS — F17210 Nicotine dependence, cigarettes, uncomplicated: Secondary | ICD-10-CM | POA: Diagnosis not present

## 2017-11-01 DIAGNOSIS — Z950 Presence of cardiac pacemaker: Secondary | ICD-10-CM

## 2017-11-01 DIAGNOSIS — J449 Chronic obstructive pulmonary disease, unspecified: Secondary | ICD-10-CM | POA: Diagnosis not present

## 2017-11-01 DIAGNOSIS — I480 Paroxysmal atrial fibrillation: Secondary | ICD-10-CM | POA: Diagnosis not present

## 2017-11-01 MED ORDER — EZETIMIBE 10 MG PO TABS: 10.0000 mg | ORAL_TABLET | Freq: Every day | ORAL | 3 refills | Status: DC

## 2017-11-01 NOTE — Patient Instructions (Addendum)
Medication Instructions:   Please start zetia one a day  Stay on asa 81 daily  Labwork:  No new labs needed  Testing/Procedures:  No further testing at this time  Please call if you have any chest pain, worsening SOB, malaise, fatigue   Follow-Up: It was a pleasure seeing you in the office today. Please call us if you have new issues that need to be addressed before your next appt.  [phone]  Your physician wants you to follow-up in: 12 months.  You will receive a reminder letter in the mail two months in advance. If you don't receive a letter, please call our office to schedule the follow-up appointment.  If you need a refill on your cardiac medications before your next appointment, please call your pharmacy.  For educational health videos Log in to : [email or web address] Or : [email or web address], password : [email or web address]

## 2017-11-09 ENCOUNTER — Other Ambulatory Visit: Payer: Self-pay | Admitting: Primary Care

## 2017-11-09 DIAGNOSIS — J449 Chronic obstructive pulmonary disease, unspecified: Secondary | ICD-10-CM

## 2017-12-02 ENCOUNTER — Other Ambulatory Visit: Payer: Self-pay | Admitting: Primary Care

## 2017-12-02 DIAGNOSIS — I10 Essential (primary) hypertension: Secondary | ICD-10-CM

## 2017-12-02 DIAGNOSIS — E785 Hyperlipidemia, unspecified: Secondary | ICD-10-CM

## 2017-12-02 DIAGNOSIS — Z125 Encounter for screening for malignant neoplasm of prostate: Secondary | ICD-10-CM

## 2017-12-06 ENCOUNTER — Encounter: Payer: Self-pay | Admitting: Primary Care

## 2017-12-06 ENCOUNTER — Ambulatory Visit (INDEPENDENT_AMBULATORY_CARE_PROVIDER_SITE_OTHER): Payer: PPO | Admitting: Primary Care

## 2017-12-06 ENCOUNTER — Ambulatory Visit (INDEPENDENT_AMBULATORY_CARE_PROVIDER_SITE_OTHER): Payer: PPO

## 2017-12-06 VITALS — BP 118/80 | HR 64 | Temp 98.1°F | Ht 65.75 in | Wt 183.0 lb

## 2017-12-06 DIAGNOSIS — J449 Chronic obstructive pulmonary disease, unspecified: Secondary | ICD-10-CM | POA: Diagnosis not present

## 2017-12-06 DIAGNOSIS — Z23 Encounter for immunization: Secondary | ICD-10-CM

## 2017-12-06 DIAGNOSIS — Z0000 Encounter for general adult medical examination without abnormal findings: Secondary | ICD-10-CM

## 2017-12-06 DIAGNOSIS — F329 Major depressive disorder, single episode, unspecified: Secondary | ICD-10-CM | POA: Diagnosis not present

## 2017-12-06 DIAGNOSIS — Z125 Encounter for screening for malignant neoplasm of prostate: Secondary | ICD-10-CM | POA: Diagnosis not present

## 2017-12-06 DIAGNOSIS — Z950 Presence of cardiac pacemaker: Secondary | ICD-10-CM | POA: Diagnosis not present

## 2017-12-06 DIAGNOSIS — I480 Paroxysmal atrial fibrillation: Secondary | ICD-10-CM

## 2017-12-06 DIAGNOSIS — Z720 Tobacco use: Secondary | ICD-10-CM

## 2017-12-06 DIAGNOSIS — G4733 Obstructive sleep apnea (adult) (pediatric): Secondary | ICD-10-CM

## 2017-12-06 DIAGNOSIS — M159 Polyosteoarthritis, unspecified: Secondary | ICD-10-CM

## 2017-12-06 DIAGNOSIS — M150 Primary generalized (osteo)arthritis: Secondary | ICD-10-CM

## 2017-12-06 DIAGNOSIS — F419 Anxiety disorder, unspecified: Secondary | ICD-10-CM | POA: Diagnosis not present

## 2017-12-06 DIAGNOSIS — F32A Depression, unspecified: Secondary | ICD-10-CM

## 2017-12-06 DIAGNOSIS — I10 Essential (primary) hypertension: Secondary | ICD-10-CM

## 2017-12-06 DIAGNOSIS — F1010 Alcohol abuse, uncomplicated: Secondary | ICD-10-CM

## 2017-12-06 DIAGNOSIS — E785 Hyperlipidemia, unspecified: Secondary | ICD-10-CM

## 2017-12-06 LAB — LIPID PANEL
Cholesterol: 124 mg/dL (ref 0–200)
HDL: 39.4 mg/dL (ref >39.00)
LDL Cholesterol: 64 mg/dL (ref 0–99)
NONHDL: 84.22
Total CHOL/HDL Ratio: 3
Triglycerides: 103 mg/dL (ref 0.0–149.0)
VLDL: 20.6 mg/dL (ref 0.0–40.0)

## 2017-12-06 LAB — PSA: PSA: 0.25 ng/mL (ref 0.10–4.00)

## 2017-12-06 LAB — COMPREHENSIVE METABOLIC PANEL
ALT: 27 U/L (ref 0–53)
AST: 17 U/L (ref 0–37)
Albumin: 4.2 g/dL (ref 3.5–5.2)
Alkaline Phosphatase: 69 U/L (ref 39–117)
BILIRUBIN TOTAL: 0.7 mg/dL (ref 0.2–1.2)
BUN: 10 mg/dL (ref 6–23)
CO2: 30 meq/L (ref 19–32)
CREATININE: 0.76 mg/dL (ref 0.40–1.50)
Calcium: 9.2 mg/dL (ref 8.4–10.5)
Chloride: 102 mEq/L (ref 96–112)
GFR: 110.37 mL/min (ref >60.00)
Glucose, Bld: 82 mg/dL (ref 70–99)
Potassium: 3.9 mEq/L (ref 3.5–5.1)
Sodium: 139 mEq/L (ref 135–145)
TOTAL PROTEIN: 6.6 g/dL (ref 6.0–8.3)

## 2017-12-06 NOTE — Assessment & Plan Note (Signed)
Stable in the office today, continue carvedilol and Micardis. BMP pending.

## 2017-12-06 NOTE — Progress Notes (Signed)
Subjective:    Patient ID: Brandon Lopez, male    DOB: Apr 27, 1955, 62 y.o.   MRN: [id number]  HPI  Brandon Lopez is a 62 year old male who presents today for complete physical.  Immunizations: -Tetanus: Completed in 2013 -Influenza: Completed this season -Pneumonia: Completed in 2016 -Shingles: Completed in 2017  Diet: He endorses a fair diet. Breakfast: Ham biscuits, eggs, bacon Lunch: Skips Dinner: Chili, hamburgers, pizza Snacks: Nuts, crackers, candy Desserts: Daily  Beverages: Beer (8-12 twelve ounce cans daily), soda,coffee, water  Exercise: He is active around his home Eye exam: Completed in 2018 Dental exam: Completes annually Colonoscopy: Completed in 2016, due again 2026 PSA: Pending today Hep C Screen: Completed in 2017  BP Readings from Last 3 Encounters:  12/06/17 118/80  12/06/17 118/80  11/01/17 (!) 137/95     Review of Systems  Constitutional: Negative for unexpected weight change.  HENT: Negative for rhinorrhea.   Respiratory: Negative for cough and shortness of breath.   Cardiovascular: Negative for chest pain.  Gastrointestinal: Negative for constipation and diarrhea.  Genitourinary: Negative for difficulty urinating.  Musculoskeletal: Negative for arthralgias.  Skin: Negative for rash.  Allergic/Immunologic: Negative for environmental allergies.  Neurological: Negative for dizziness, numbness and headaches.  Psychiatric/Behavioral:       Overall doing well off of medications       Past Medical History:  Diagnosis Date  . Arthritis   . Chronic systolic CHF (congestive heart failure) ([place])    a. echo 6/16: EF 55-60%, Gr1DD, LA nl, PASP nl; b. echo 7/19: EF 40-45%, unable to exclude RWMA, Gr1DD, LA nl, RVSF nl, PASP nl  . COPD (chronic obstructive pulmonary disease) ([place])   . Depression   . Diverticulosis    Found during colonoscopy  . Hypertension   . Kidney stones   . PAF (paroxysmal atrial fibrillation) ([place])    a. short  episodes previously noted on PPM interrogation; b. started on Xarelto 07/2017 given further episodes of Afib noted during interrogation; c. CHADS2VASc at least 3 (CHF, HTN, vascualr disease with aortic calcifications noted on CT)  . 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 Presence of permanent cardiac pacemaker    a. Biotronik; Dayna Barker DR-T [id number]; placed 08/2012  . Sleep apnea   . Symptomatic bradycardia    a. s/p Biotronik; Dayna Barker DR-T [id number]; placed 08/2012 at [place] History  . Marital status: Single    Spouse name: Not on file  . Number of children: Not on file  . Years of education: Not on file  . Highest education level: Not on file  Occupational History  . Not on file  Social Needs  . Financial resource strain: Not on file  . Food insecurity:    Worry: Not on file    Inability: Not on file  . Transportation needs:    Medical: Not on file    Non-medical: Not on file  Tobacco Use  . Smoking status: Heavy Tobacco Smoker    Packs/day: 2.00    Years: 40.00    Pack years: 80.00    Types: Cigarettes  . Smokeless tobacco: Never Used  Substance and Sexual Activity  . Alcohol use: Yes    Alcohol/week: 8.0 - 14.0 standard drinks    Types: 8 - 14 Cans of beer per week  . Drug use: Yes    Types: Marijuana  . Sexual activity: Not on file  Lifestyle  . Physical activity:  Days per week: Not on file    Minutes per session: Not on file  . Stress: Not on file  Relationships  . Social connections:    Talks on phone: Not on file    Gets together: Not on file    Attends religious service: Not on file    Active member of club or organization: Not on file    Attends meetings of clubs or organizations: Not on file    Relationship status: Not on file  . Intimate partner violence:    Fear of current or ex partner: Not on file    Emotionally abused: Not on file    Physically abused: Not on file    Forced sexual activity: Not on file  Other Topics Concern  .  Not on file  Social History Narrative  . Not on file    Past Surgical History:  Procedure Laterality Date  . APPENDECTOMY  age 24  . CARDIAC CATHETERIZATION     7 years ago  . COLONOSCOPY N/A 05/27/2014   Procedure: COLONOSCOPY;  Surgeon: Hulen Luster, MD;  Location: [facility] ENDOSCOPY;  Service: Gastroenterology;  Laterality: N/A;  . INSERT / REPLACE / REMOVE PACEMAKER     3 years ago    Family History  Problem Relation Age of Onset  . Arthritis Mother   . Heart disease Father   . Hyperlipidemia Father   . Hypertension Father   . Cancer Paternal Grandfather     Allergies  Allergen Reactions  . Simvastatin Diarrhea  . Lisinopril Cough    Current Outpatient Medications on File Prior to Visit  Medication Sig Dispense Refill  . albuterol (PROAIR HFA) 108 (90 Base) MCG/ACT inhaler Inhale 2 puffs into the lungs every 6 (six) hours as needed for wheezing or shortness of breath. 18 g 2  . atorvastatin (LIPITOR) 20 MG tablet TAKE 1 TABLET BY MOUTH IN THE EVENING 90 tablet 1  . ezetimibe (ZETIA) 10 MG tablet Take 1 tablet (10 mg total) by mouth daily. 90 tablet 3  . gabapentin (NEURONTIN) 100 MG capsule Take 100 mg by mouth 2 (two) times daily as needed.     . loratadine (CLARITIN) 10 MG tablet Take 10 mg by mouth daily as needed for allergies.     . SYMBICORT 160-4.5 MCG/ACT inhaler INHALE 2 PUFFS BY MOUTH TWICE DAILY 3 Inhaler 1  . telmisartan-hydrochlorothiazide (MICARDIS HCT) 40-12.5 MG tablet Take 1 tablet (40/12.5 mg) by mouth once daily 90 tablet 3  . carvedilol (COREG) 3.125 MG tablet Take 1 tablet (3.125 mg total) by mouth 2 (two) times daily. 180 tablet 3   No current facility-administered medications on file prior to visit.     BP 118/80 (BP Location: Right Arm, Patient Position: Sitting, Cuff Size: Normal)   Pulse 64   Temp 98.1 F (36.7 C) (Oral)   Ht 5' 5.75" (1.67 m) Comment: shoes  Wt 183 lb (83 kg)   SpO2 96%   BMI 29.76 kg/m    Objective:   Physical Exam    Constitutional: He is oriented to person, place, and time. He appears well-nourished.  HENT:  Mouth/Throat: No oropharyngeal exudate.  Eyes: Pupils are equal, round, and reactive to light. EOM are normal.  Neck: Neck supple. No thyromegaly present.  Cardiovascular: Normal rate and regular rhythm.  Respiratory: Effort normal and breath sounds normal.  GI: Soft. Bowel sounds are normal. There is no tenderness.  Musculoskeletal: Normal range of motion.  Neurological: He is alert and  oriented to person, place, and time.  Skin: Skin is warm and dry.  Psychiatric: He has a normal mood and affect.           Assessment & Plan:

## 2017-12-06 NOTE — Assessment & Plan Note (Signed)
Overall doing well off of medications, continue to monitor. Denies SI/HI.

## 2017-12-06 NOTE — Assessment & Plan Note (Signed)
Recent follow up with electrophysiology in August. Continue regular visits.

## 2017-12-06 NOTE — Assessment & Plan Note (Signed)
Continues to drink 8-12 twelve ounce cans of beer daily, discouraged this and encouraged him to cut back. LFT's pending.

## 2017-12-06 NOTE — Assessment & Plan Note (Addendum)
Pacemaker in place, recent visit in August 2019. Unremarkable. Continue to monitor. No longer on Xaralto.

## 2017-12-06 NOTE — Assessment & Plan Note (Signed)
Infrequent use of gabapentin. Overall doing well. Continue regular activity. Continue to monitor.

## 2017-12-06 NOTE — Assessment & Plan Note (Signed)
No longer sleeping with Bipap, felt worse with than without. No recent follow up with pulmonology, encouraged him to schedule an appointment.

## 2017-12-06 NOTE — Progress Notes (Signed)
PCP notes:   Health maintenance:  Flu vaccine - administered  Abnormal screenings:   None  Patient concerns:   None  Nurse concerns:  None  Next PCP appt:   12/06/2017 @ 1020

## 2017-12-06 NOTE — Patient Instructions (Signed)
You will be contacted regarding your referral for lung cancer screening.  Please let us know if you have not been contacted within one week.   It's important to improve your diet by reducing consumption of fast food, fried food, processed snack foods, sugary drinks. Increase consumption of fresh vegetables and fruits, whole grains, water.  Ensure you are drinking 64 ounces of water daily.  Start exercising. You should be getting 150 minutes of exercise weekly.  You should quit drinking alcohol, work on cutting back.   We will see you in one year for your annual exam or sooner if needed.  It was a pleasure to see you today!   Preventive Care 40-64 Years, Male Preventive care refers to lifestyle choices and visits with your health care provider that can promote health and wellness. What does preventive care include?  A yearly physical exam. This is also called an annual well check.  Dental exams once or twice a year.  Routine eye exams. Ask your health care provider how often you should have your eyes checked.  Personal lifestyle choices, including: ? Daily care of your teeth and gums. ? Regular physical activity. ? Eating a healthy diet. ? Avoiding tobacco and drug use. ? Limiting alcohol use. ? Practicing safe sex. ? Taking low-dose aspirin every day starting at age 36. What happens during an annual well check? The services and screenings done by your health care provider during your annual well check will depend on your age, overall health, lifestyle risk factors, and family history of disease. Counseling Your health care provider may ask you questions about your:  Alcohol use.  Tobacco use.  Drug use.  Emotional well-being.  Home and relationship well-being.  Sexual activity.  Eating habits.  Work and work Statistician.  Screening You may have the following tests or measurements:  Height, weight, and BMI.  Blood pressure.  Lipid and cholesterol levels.  These may be checked every 5 years, or more frequently if you are over 92 years old.  Skin check.  Lung cancer screening. You may have this screening every year starting at age 42 if you have a 30-pack-year history of smoking and currently smoke or have quit within the past 15 years.  Fecal occult blood test (FOBT) of the stool. You may have this test every year starting at age 71.  Flexible sigmoidoscopy or colonoscopy. You may have a sigmoidoscopy every 5 years or a colonoscopy every 10 years starting at age 17.  Prostate cancer screening. Recommendations will vary depending on your family history and other risks.  Hepatitis C blood test.  Hepatitis B blood test.  Sexually transmitted disease (STD) testing.  Diabetes screening. This is done by checking your blood sugar (glucose) after you have not eaten for a while (fasting). You may have this done every 1-3 years.  Discuss your test results, treatment options, and if necessary, the need for more tests with your health care provider. Vaccines Your health care provider may recommend certain vaccines, such as:  Influenza vaccine. This is recommended every year.  Tetanus, diphtheria, and acellular pertussis (Tdap, Td) vaccine. You may need a Td booster every 10 years.  Varicella vaccine. You may need this if you have not been vaccinated.  Zoster vaccine. You may need this after age 45.  Measles, mumps, and rubella (MMR) vaccine. You may need at least one dose of MMR if you were born in 1957 or later. You may also need a second dose.  Pneumococcal 13-valent  conjugate (PCV13) vaccine. You may need this if you have certain conditions and have not been vaccinated.  Pneumococcal polysaccharide (PPSV23) vaccine. You may need one or two doses if you smoke cigarettes or if you have certain conditions.  Meningococcal vaccine. You may need this if you have certain conditions.  Hepatitis A vaccine. You may need this if you have certain  conditions or if you travel or work in places where you may be exposed to hepatitis A.  Hepatitis B vaccine. You may need this if you have certain conditions or if you travel or work in places where you may be exposed to hepatitis B.  Haemophilus influenzae type b (Hib) vaccine. You may need this if you have certain risk factors.  Talk to your health care provider about which screenings and vaccines you need and how often you need them. This information is not intended to replace advice given to you by your health care provider. Make sure you discuss any questions you have with your health care provider. Document Released: 02/07/2015 Document Revised: 10/01/2015 Document Reviewed: 11/12/2014 Elsevier Interactive Patient Education  [employer].

## 2017-12-06 NOTE — Progress Notes (Signed)
Subjective:   Brandon Lopez is a 62 y.o. male who presents for Medicare Annual/Subsequent preventive examination.  Review of Systems:  N/A Cardiac Risk Factors include: advanced age (>48men, >64 women);dyslipidemia;smoking/ tobacco exposure;male gender;hypertension     Objective:    Vitals: BP 118/80 (BP Location: Right Arm, Patient Position: Sitting, Cuff Size: Normal)   Pulse 64   Temp 98.1 F (36.7 C) (Oral)   Ht 5' 5.75" (1.67 m) Comment: shoes  Wt 183 lb (83 kg)   SpO2 96%   BMI 29.76 kg/m   Body mass index is 29.76 kg/m.  Advanced Directives 12/06/2017 08/10/2017 11/05/2015 05/27/2014  Does Patient Have a Medical Advance Directive? Yes No No No  Type of Paramedic of [place];Living will - - -  Copy of [place] in Chart? No - copy requested - - -  Would patient like information on creating a medical advance directive? - No - Patient declined Yes - Educational materials given No - patient declined information    Tobacco Social History   Tobacco Use  Smoking Status Heavy Tobacco Smoker  . Packs/day: 2.00  . Years: 40.00  . Pack years: 80.00  . Types: Cigarettes  Smokeless Tobacco Never Used     Ready to quit: No Counseling given: No   Clinical Intake:  Pre-visit preparation completed: Yes  Pain : No/denies pain Pain Score: 0-No pain     Nutritional Status: BMI > 30  Obese Nutritional Risks: None Diabetes: No  How often do you need to have someone help you when you read instructions, pamphlets, or other written materials from your doctor or pharmacy?: 1 - Never What is the last grade level you completed in school?: GED  Interpreter Needed?: No  Comments: pt lives with significant other Information entered by :: LPinson, LPN  Past Medical History:  Diagnosis Date  . Arthritis   . Chronic systolic CHF (congestive heart failure) ([place])    a. echo 6/16: EF 55-60%, Gr1DD, LA nl, PASP nl; b. echo  7/19: EF 40-45%, unable to exclude RWMA, Gr1DD, LA nl, RVSF nl, PASP nl  . COPD (chronic obstructive pulmonary disease) ([place])   . Depression   . Diverticulosis    Found during colonoscopy  . Hypertension   . Kidney stones   . PAF (paroxysmal atrial fibrillation) ([place])    a. short episodes previously noted on PPM interrogation; b. started on Xarelto 07/2017 given further episodes of Afib noted during interrogation; c. CHADS2VASc at least 3 (CHF, HTN, vascualr disease with aortic calcifications noted on CT)  . 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 Presence of permanent cardiac pacemaker    a. Biotronik; Dayna Barker DR-T [id number]; placed 08/2012  . Sleep apnea   . Symptomatic bradycardia    a. s/p Biotronik; Dayna Barker DR-T [id number]; placed 08/2012 at [facility]   Past Surgical History:  Procedure Laterality Date  . APPENDECTOMY  age 1  . CARDIAC CATHETERIZATION     7 years ago  . COLONOSCOPY N/A 05/27/2014   Procedure: COLONOSCOPY;  Surgeon: Hulen Luster, MD;  Location: [facility] ENDOSCOPY;  Service: Gastroenterology;  Laterality: N/A;  . INSERT / REPLACE / REMOVE PACEMAKER     3 years ago   Family History  Problem Relation Age of Onset  . Arthritis Mother   . Heart disease Father   . Hyperlipidemia Father   . Hypertension Father   . Cancer Paternal Grandfather    Social History   Socioeconomic History  .  Marital status: Single    Spouse name: Not on file  . Number of children: Not on file  . Years of education: Not on file  . Highest education level: Not on file  Occupational History  . Not on file  Social Needs  . Financial resource strain: Not on file  . Food insecurity:    Worry: Not on file    Inability: Not on file  . Transportation needs:    Medical: Not on file    Non-medical: Not on file  Tobacco Use  . Smoking status: Heavy Tobacco Smoker    Packs/day: 2.00    Years: 40.00    Pack years: 80.00    Types: Cigarettes  . Smokeless tobacco: Never Used  Substance and Sexual Activity  . Alcohol  use: Yes    Alcohol/week: 8.0 - 14.0 standard drinks    Types: 8 - 14 Cans of beer per week  . Drug use: Yes    Types: Marijuana  . Sexual activity: Not on file  Lifestyle  . Physical activity:    Days per week: Not on file    Minutes per session: Not on file  . Stress: Not on file  Relationships  . Social connections:    Talks on phone: Not on file    Gets together: Not on file    Attends religious service: Not on file    Active member of club or organization: Not on file    Attends meetings of clubs or organizations: Not on file    Relationship status: Not on file  Other Topics Concern  . Not on file  Social History Narrative  . Not on file    Outpatient Encounter Medications as of 12/06/2017  Medication Sig  . albuterol (PROAIR HFA) 108 (90 Base) MCG/ACT inhaler Inhale 2 puffs into the lungs every 6 (six) hours as needed for wheezing or shortness of breath.  Marland Kitchen atorvastatin (LIPITOR) 20 MG tablet TAKE 1 TABLET BY MOUTH IN THE EVENING  . ezetimibe (ZETIA) 10 MG tablet Take 1 tablet (10 mg total) by mouth daily.  Marland Kitchen gabapentin (NEURONTIN) 100 MG capsule Take 100 mg by mouth 2 (two) times daily as needed.   . loratadine (CLARITIN) 10 MG tablet Take 10 mg by mouth daily as needed for allergies.   . SYMBICORT 160-4.5 MCG/ACT inhaler INHALE 2 PUFFS BY MOUTH TWICE DAILY  . telmisartan-hydrochlorothiazide (MICARDIS HCT) 40-12.5 MG tablet Take 1 tablet (40/12.5 mg) by mouth once daily  . carvedilol (COREG) 3.125 MG tablet Take 1 tablet (3.125 mg total) by mouth 2 (two) times daily.   No facility-administered encounter medications on file as of 12/06/2017.     Activities of Daily Living In your present state of health, do you have any difficulty performing the following activities: 12/06/2017 08/10/2017  Hearing? N N  Vision? N N  Difficulty concentrating or making decisions? N N  Walking or climbing stairs? N N  Dressing or bathing? N N  Doing errands, shopping? N N  Preparing  Food and eating ? N -  Using the Toilet? N -  In the past six months, have you accidently leaked urine? N -  Do you have problems with loss of bowel control? N -  Managing your Medications? N -  Managing your Finances? N -  Housekeeping or managing your Housekeeping? N -  Some recent data might be hidden    Patient Care Team: Pleas Koch, NP as PCP - General (Nurse Practitioner)  Assessment:   This is a routine wellness examination for Mohammad.   Hearing Screening   125Hz  250Hz  500Hz  1000Hz  2000Hz  3000Hz  4000Hz  6000Hz  8000Hz   Right ear:   40 40 40  40    Left ear:   40 40 40  40      Visual Acuity Screening   Right eye Left eye Both eyes  Without correction:     With correction: 20/30 20/15 20/20      Exercise Activities and Dietary recommendations Current Exercise Habits: The patient does not participate in regular exercise at present, Exercise limited by: None identified  Goals    . Increase water intake     Starting 12/06/2017, I will continue to drink 3-4 glasses of water daily.        Fall Risk Fall Risk  12/06/2017 11/05/2015  Falls in the past year? 0 No   Depression Screen PHQ 2/9 Scores 12/06/2017 04/14/2016 11/05/2015 07/11/2014  PHQ - 2 Score 0 6 0 0  PHQ- 9 Score 0 17 - -    Cognitive Function MMSE - Mini Mental State Exam 12/06/2017 11/05/2015  Orientation to time 5 5  Orientation to Place 5 5  Registration 3 3  Attention/ Calculation 0 0  Recall 3 3  Language- name 2 objects 0 0  Language- repeat 1 1  Language- follow 3 step command 3 3  Language- read & follow direction 0 0  Write a sentence 0 0  Copy design 0 0  Total score 20 20     PLEASE NOTE: A Mini-Cog screen was completed. Maximum score is 20. A value of 0 denotes this part of Folstein MMSE was not completed or the patient failed this part of the Mini-Cog screening.   Mini-Cog Screening Orientation to Time - Max 5 pts Orientation to Place - Max 5 pts Registration - Max 3  pts Recall - Max 3 pts Language Repeat - Max 1 pts Language Follow 3 Step Command - Max 3 pts     Immunization History  Administered Date(s) Administered  . Influenza,inj,Quad PF,6+ Mos 11/15/2014, 11/05/2015, 12/23/2016, 12/06/2017  . Pneumococcal Polysaccharide-23 11/04/2014  . Td 10/26/2011  . Zoster 11/18/2015    Screening Tests Health Maintenance  Topic Date Due  . HIV Screening  11/04/2025 (Originally 09/11/1970)  . TETANUS/TDAP  10/25/2021  . COLONOSCOPY  05/26/2024  . INFLUENZA VACCINE  Completed  . Hepatitis C Screening  Completed       Plan:     I have personally reviewed, addressed, and noted the following in the patient's chart:  A. Medical and social history B. Use of alcohol, tobacco or illicit drugs  C. Current medications and supplements D. Functional ability and status E.  Nutritional status F.  Physical activity G. Advance directives H. List of other physicians I.  Hospitalizations, surgeries, and ER visits in previous 12 months J.  [place] to include hearing, vision, cognitive, depression L. Referrals and appointments - none  In addition, I have reviewed and discussed with patient certain preventive protocols, quality metrics, and best practice recommendations. A written personalized care plan for preventive services as well as general preventive health recommendations were provided to patient.  See attached scanned questionnaire for additional information.   Signed,   Lindell Noe, MHA, BS, LPN Health Coach

## 2017-12-06 NOTE — Assessment & Plan Note (Signed)
Immunizations UTD. PSA pending. Colonoscopy UTD. Strongly advised he work on diet, start with regular exercise.  Exam unremarkable. Labs pending. Follow up in 1 year for CPE.

## 2017-12-06 NOTE — Assessment & Plan Note (Signed)
Compliant to atorvastatin and Zetia. Continue same. Lipid panel pending. Goal LDL <70. Encouraged a healthy diet and regular exercise.

## 2017-12-06 NOTE — Assessment & Plan Note (Addendum)
Occasional use of albuterol, once every several weeks. Lung clear on exam. Compliant to Symbicort twice daily. Continue same.

## 2017-12-06 NOTE — Assessment & Plan Note (Signed)
Smoking 2 packs of cigarettes daily. Smoking since the age of 48. Chest xray in July 2019 clear. Referral placed for lung cancer screening.

## 2017-12-06 NOTE — Progress Notes (Signed)
I reviewed health advisor's note, was available for consultation, and agree with documentation and plan.  

## 2017-12-06 NOTE — Patient Instructions (Signed)
Brandon Lopez , Thank you for taking time to come for your Medicare Wellness Visit. I appreciate your ongoing commitment to your health goals. Please review the following plan we discussed and let me know if I can assist you in the future.   These are the goals we discussed: Goals    . Increase water intake     Starting 12/06/2017, I will continue to drink 3-4 glasses of water daily.        This is a list of the screening recommended for you and due dates:  Health Maintenance  Topic Date Due  . HIV Screening  11/04/2025*  . Tetanus Vaccine  10/25/2021  . Colon Cancer Screening  05/26/2024  . Flu Shot  Completed  .  Hepatitis C: One time screening is recommended by Center for Disease Control  (CDC) for  adults born from [birth year] through 1965.   Completed  *Topic was postponed. The date shown is not the original due date.   Preventive Care for Adults  A healthy lifestyle and preventive care can promote health and wellness. Preventive health guidelines for adults include the following key practices.  . A routine yearly physical is a good way to check with your health care provider about your health and preventive screening. It is a chance to share any concerns and updates on your health and to receive a thorough exam.  . Visit your dentist for a routine exam and preventive care every 6 months. Brush your teeth twice a day and floss once a day. Good oral hygiene prevents tooth decay and gum disease.  . The frequency of eye exams is based on your age, health, family medical history, use  of contact lenses, and other factors. Follow your health care provider's recommendations for frequency of eye exams.  . Eat a healthy diet. Foods like vegetables, fruits, whole grains, low-fat dairy products, and lean protein foods contain the nutrients you need without too many calories. Decrease your intake of foods high in solid fats, added sugars, and salt. Eat the right amount of calories for you. Get  information about a proper diet from your health care provider, if necessary.  . Regular physical exercise is one of the most important things you can do for your health. Most adults should get at least 150 minutes of moderate-intensity exercise (any activity that increases your heart rate and causes you to sweat) each week. In addition, most adults need muscle-strengthening exercises on 2 or more days a week.  Silver Sneakers may be a benefit available to you. To determine eligibility, you may visit the website: www.silversneakers.com or contact program at (760) 760-9201 Mon-Fri between 8AM-8PM.   . Maintain a healthy weight. The body mass index (BMI) is a screening tool to identify possible weight problems. It provides an estimate of body fat based on height and weight. Your health care provider can find your BMI and can help you achieve or maintain a healthy weight.   For adults 20 years and older: ? A BMI below 18.5 is considered underweight. ? A BMI of 18.5 to 24.9 is normal. ? A BMI of 25 to 29.9 is considered overweight. ? A BMI of 30 and above is considered obese.   . Maintain normal blood lipids and cholesterol levels by exercising and minimizing your intake of saturated fat. Eat a balanced diet with plenty of fruit and vegetables. Blood tests for lipids and cholesterol should begin at age 27 and be repeated every 5 years. If your lipid  or cholesterol levels are high, you are over 50, or you are at high risk for heart disease, you may need your cholesterol levels checked more frequently. Ongoing high lipid and cholesterol levels should be treated with medicines if diet and exercise are not working.  . If you smoke, find out from your health care provider how to quit. If you do not use tobacco, please do not start.  . If you choose to drink alcohol, please do not consume more than 2 drinks per day. One drink is considered to be 12 ounces (355 mL) of beer, 5 ounces (148 mL) of wine, or 1.5  ounces (44 mL) of liquor.  . If you are 32-46 years old, ask your health care provider if you should take aspirin to prevent strokes.  . Use sunscreen. Apply sunscreen liberally and repeatedly throughout the day. You should seek shade when your shadow is shorter than you. Protect yourself by wearing long sleeves, pants, a wide-brimmed hat, and sunglasses year round, whenever you are outdoors.  . Once a month, do a whole body skin exam, using a mirror to look at the skin on your back. Tell your health care provider of new moles, moles that have irregular borders, moles that are larger than a pencil eraser, or moles that have changed in shape or color.

## 2017-12-12 ENCOUNTER — Telehealth: Payer: Self-pay | Admitting: *Deleted

## 2017-12-12 DIAGNOSIS — F17210 Nicotine dependence, cigarettes, uncomplicated: Secondary | ICD-10-CM

## 2017-12-12 DIAGNOSIS — Z122 Encounter for screening for malignant neoplasm of respiratory organs: Secondary | ICD-10-CM

## 2017-12-12 DIAGNOSIS — Z87891 Personal history of nicotine dependence: Secondary | ICD-10-CM

## 2017-12-13 ENCOUNTER — Ambulatory Visit (INDEPENDENT_AMBULATORY_CARE_PROVIDER_SITE_OTHER): Payer: PPO

## 2017-12-13 DIAGNOSIS — I495 Sick sinus syndrome: Secondary | ICD-10-CM

## 2017-12-13 DIAGNOSIS — I5022 Chronic systolic (congestive) heart failure: Secondary | ICD-10-CM

## 2017-12-13 NOTE — Progress Notes (Signed)
Remote pacemaker transmission.   

## 2017-12-14 NOTE — Telephone Encounter (Signed)
Spoke with pt and scheduled SDMV 01-04-18 9:30 CT ordered Nothing further needed

## 2018-01-04 ENCOUNTER — Encounter: Payer: Self-pay | Admitting: Acute Care

## 2018-01-04 ENCOUNTER — Ambulatory Visit (INDEPENDENT_AMBULATORY_CARE_PROVIDER_SITE_OTHER)
Admission: RE | Admit: 2018-01-04 | Discharge: 2018-01-04 | Disposition: A | Discharge status: Home / Self Care | Payer: PPO | Source: Ambulatory Visit | Attending: Acute Care | Admitting: Acute Care

## 2018-01-04 ENCOUNTER — Ambulatory Visit (INDEPENDENT_AMBULATORY_CARE_PROVIDER_SITE_OTHER): Payer: PPO | Admitting: Acute Care

## 2018-01-04 VITALS — BP 124/74 | HR 90 | Ht 65.0 in | Wt 183.0 lb

## 2018-01-04 DIAGNOSIS — Z87891 Personal history of nicotine dependence: Secondary | ICD-10-CM | POA: Diagnosis not present

## 2018-01-04 DIAGNOSIS — F17210 Nicotine dependence, cigarettes, uncomplicated: Secondary | ICD-10-CM

## 2018-01-04 DIAGNOSIS — Z122 Encounter for screening for malignant neoplasm of respiratory organs: Secondary | ICD-10-CM

## 2018-01-04 NOTE — Progress Notes (Signed)
Shared Decision Making Visit Lung Cancer Screening Program [id number])   Eligibility:  Age 62 y.o.  Pack Years Smoking History Calculation 100 pack year smoking history (# packs/per year x # years smoked)  Recent History of coughing up blood  no  Unexplained weight loss? no ( >Than 15 pounds within the last 6 months )  Prior History Lung / other cancer no (Diagnosis within the last 5 years already requiring surveillance chest CT Scans).  Smoking Status Current Smoker  Former Smokers: Years since quit: NA  Quit Date: NA  Visit Components:  Discussion included one or more decision making aids. yes  Discussion included risk/benefits of screening. yes  Discussion included potential follow up diagnostic testing for abnormal scans. yes  Discussion included meaning and risk of over diagnosis. yes  Discussion included meaning and risk of False Positives. yes  Discussion included meaning of total radiation exposure. yes  Counseling Included:  Importance of adherence to annual lung cancer LDCT screening. yes  Impact of comorbidities on ability to participate in the program. yes  Ability and willingness to under diagnostic treatment. yes  Smoking Cessation Counseling:  Current Smokers:   Discussed importance of smoking cessation. yes  Information about tobacco cessation classes and interventions provided to patient. yes  Patient provided with "ticket" for LDCT Scan. yes  Symptomatic Patient. no  Counseling  Diagnosis Code: Tobacco Use Z72.0  Asymptomatic Patient yes  Counseling (Intermediate counseling: > three minutes counseling) [id number]  Former Smokers:   Discussed the importance of maintaining cigarette abstinence. yes  Diagnosis Code: Personal History of Nicotine Dependence. D32.671  Information about tobacco cessation classes and interventions provided to patient. Yes  Patient provided with "ticket" for LDCT Scan. yes  Written Order for Lung Cancer  Screening with LDCT placed in Epic. Yes (CT Chest Lung Cancer Screening Low Dose W/O CM) [id number] Z12.2-Screening of respiratory organs Z87.891-Personal history of nicotine dependence  I have spent 25 minutes of face to face time with Mr. Michel discussing the risks and benefits of lung cancer screening. We viewed a power point together that explained in detail the above noted topics. We paused at intervals to allow for questions to be asked and answered to ensure understanding.We discussed that the single most powerful action that he can take to decrease his risk of developing lung cancer is to quit smoking. We discussed whether or not he is ready to commit to setting a quit date. We discussed options for tools to aid in quitting smoking including nicotine replacement therapy, non-nicotine medications, support groups, Quit Smart classes, and behavior modification. We discussed that often times setting smaller, more achievable goals, such as eliminating 1 cigarette a day for a week and then 2 cigarettes a day for a week can be helpful in slowly decreasing the number of cigarettes smoked. This allows for a sense of accomplishment as well as providing a clinical benefit. I gave him the " Be Stronger Than Your Excuses" card with contact information for community resources, classes, free nicotine replacement therapy, and access to mobile apps, text messaging, and on-line smoking cessation help. I have also given him my card and contact information in the event he needs to contact me. We discussed the time and location of the scan, and that either Doroteo Glassman RN or I will call with the results within 24-48 hours of receiving them. I have offered him  a copy of the power point we viewed  as a resource in the event they need reinforcement of  the concepts we discussed today in the office. The patient verbalized understanding of all of  the above and had no further questions upon leaving the office. They have my  contact information in the event they have any further questions.  I spent 3 minutes counseling on smoking cessation and the health risks of continued tobacco abuse.  I explained to the patient that there has been a high incidence of coronary artery disease noted on these exams. I explained that this is a non-gated exam therefore degree or severity cannot be determined. This patient is on statin therapy. I have asked the patient to follow-up with their PCP regarding any incidental finding of coronary artery disease and management with diet or medication as their PCP  feels is clinically indicated. The patient verbalized understanding of the above and had no further questions upon completion of the visit.       Magdalen Spatz, NP 01/04/2018 10:36 AM

## 2018-01-05 ENCOUNTER — Other Ambulatory Visit: Payer: Self-pay | Admitting: Acute Care

## 2018-01-05 DIAGNOSIS — Z122 Encounter for screening for malignant neoplasm of respiratory organs: Secondary | ICD-10-CM

## 2018-01-05 DIAGNOSIS — F17210 Nicotine dependence, cigarettes, uncomplicated: Principal | ICD-10-CM

## 2018-02-07 LAB — CUP PACEART REMOTE DEVICE CHECK
Date Time Interrogation Session: 20200114202239
Implantable Lead Implant Date: 20140801
Implantable Lead Implant Date: 20140801
Implantable Lead Location: 753859
Implantable Lead Location: 753860
Implantable Lead Serial Number: 29356730
Implantable Lead Serial Number: 29392661
Implantable Pulse Generator Implant Date: 20140801
Pulse Gen Serial Number: 68053398

## 2018-03-14 ENCOUNTER — Ambulatory Visit (INDEPENDENT_AMBULATORY_CARE_PROVIDER_SITE_OTHER): Payer: PPO

## 2018-03-14 DIAGNOSIS — I495 Sick sinus syndrome: Secondary | ICD-10-CM

## 2018-03-14 DIAGNOSIS — R55 Syncope and collapse: Secondary | ICD-10-CM

## 2018-03-14 LAB — CUP PACEART REMOTE DEVICE CHECK
Date Time Interrogation Session: 20200218121715
Implantable Lead Implant Date: 20140801
Implantable Lead Location: 753859
Implantable Lead Location: 753860
Implantable Lead Serial Number: 29356730
Implantable Lead Serial Number: 29392661
Implantable Pulse Generator Implant Date: 20140801
MDC IDC LEAD IMPLANT DT: 20140801
Pulse Gen Serial Number: 68053398

## 2018-03-16 ENCOUNTER — Other Ambulatory Visit: Payer: Self-pay | Admitting: Primary Care

## 2018-03-16 DIAGNOSIS — E785 Hyperlipidemia, unspecified: Secondary | ICD-10-CM

## 2018-03-22 NOTE — Progress Notes (Signed)
Remote pacemaker transmission.   

## 2018-03-27 ENCOUNTER — Encounter: Payer: Self-pay | Admitting: Cardiology

## 2018-04-21 ENCOUNTER — Telehealth: Payer: Self-pay | Admitting: Cardiology

## 2018-04-21 NOTE — Telephone Encounter (Signed)
Patient called and left VM and I called him back. He stated that he has not been sleeping within 4-6 feet of the monitor and he would move it to where he is sleeping. I will make sure the monitor is updating on Monday 04-24-2018.

## 2018-04-21 NOTE — Telephone Encounter (Signed)
LMOVM for pt to return call in regards to disconnected monitor. Monitor has not updated since 02/22/2018.

## 2018-04-23 ENCOUNTER — Other Ambulatory Visit: Payer: Self-pay | Admitting: Primary Care

## 2018-04-23 DIAGNOSIS — J449 Chronic obstructive pulmonary disease, unspecified: Secondary | ICD-10-CM

## 2018-04-24 NOTE — Telephone Encounter (Signed)
Home monitor is updated.

## 2018-06-13 ENCOUNTER — Ambulatory Visit (INDEPENDENT_AMBULATORY_CARE_PROVIDER_SITE_OTHER): Payer: PPO | Admitting: *Deleted

## 2018-06-13 ENCOUNTER — Other Ambulatory Visit: Payer: Self-pay

## 2018-06-13 DIAGNOSIS — I495 Sick sinus syndrome: Secondary | ICD-10-CM | POA: Diagnosis not present

## 2018-06-13 LAB — CUP PACEART REMOTE DEVICE CHECK
Date Time Interrogation Session: 20200519180937
Implantable Lead Implant Date: 20140801
Implantable Lead Implant Date: 20140801
Implantable Lead Location: 753859
Implantable Lead Location: 753860
Implantable Lead Serial Number: 29356730
Implantable Lead Serial Number: 29392661
Implantable Pulse Generator Implant Date: 20140801
Pulse Gen Serial Number: 68053398

## 2018-06-22 ENCOUNTER — Encounter: Payer: Self-pay | Admitting: Cardiology

## 2018-06-22 NOTE — Progress Notes (Signed)
Remote pacemaker transmission.   

## 2018-09-12 ENCOUNTER — Ambulatory Visit (INDEPENDENT_AMBULATORY_CARE_PROVIDER_SITE_OTHER): Payer: PPO | Admitting: *Deleted

## 2018-09-12 DIAGNOSIS — I495 Sick sinus syndrome: Secondary | ICD-10-CM

## 2018-09-13 LAB — CUP PACEART REMOTE DEVICE CHECK
Date Time Interrogation Session: 20200819085806
Implantable Lead Implant Date: 20140801
Implantable Lead Implant Date: 20140801
Implantable Lead Location: 753859
Implantable Lead Location: 753860
Implantable Lead Serial Number: 29356730
Implantable Lead Serial Number: 29392661
Implantable Pulse Generator Implant Date: 20140801
Pulse Gen Serial Number: 68053398

## 2018-09-14 ENCOUNTER — Other Ambulatory Visit: Payer: Self-pay | Admitting: Primary Care

## 2018-09-14 ENCOUNTER — Other Ambulatory Visit: Payer: Self-pay | Admitting: Physician Assistant

## 2018-09-14 DIAGNOSIS — E785 Hyperlipidemia, unspecified: Secondary | ICD-10-CM

## 2018-09-19 ENCOUNTER — Ambulatory Visit (INDEPENDENT_AMBULATORY_CARE_PROVIDER_SITE_OTHER): Payer: PPO | Admitting: Primary Care

## 2018-09-19 ENCOUNTER — Encounter: Payer: Self-pay | Admitting: Primary Care

## 2018-09-19 ENCOUNTER — Other Ambulatory Visit: Payer: Self-pay

## 2018-09-19 DIAGNOSIS — K625 Hemorrhage of anus and rectum: Secondary | ICD-10-CM

## 2018-09-19 HISTORY — DX: Hemorrhage of anus and rectum: K62.5

## 2018-09-19 LAB — POC HEMOCCULT BLD/STL (OFFICE/1-CARD/DIAGNOSTIC): Fecal Occult Blood, POC: NEGATIVE

## 2018-09-19 NOTE — Progress Notes (Signed)
Subjective:    Patient ID: Brandon Lopez, male    DOB: May 21, 1955, 63 y.o.   MRN: [id number]  HPI  Brandon Lopez is a 63 year old male with a history of syncope, hypertension, paroxysmal atrial fibrillation, OSA, COPD, pacemaker, tobacco abuse who presents today with a chief complaint of rectal bleeding.  His last colonoscopy was in May of 2016 with findings of diverticulosis to the sigmoid colon, otherwise unremarkable. Recall due in 2026. He typically has two bowel movements every morning.   Yesterday morning he work up to have his typical morning bowel movement, noticed that the stool was very firm and difficult/painful to pass. After his bowel movement he noticed bright red blood on the toilet paper after wiping. He had a second bowel movement about 45 minutes later, this bowel movement wasn't as painful, he did notice some blood on the toilet paper after wiping after this movement. He had no bleeding for the rest of the day. No bleeding or firm stool during movement this morning.   Several months ago he started to notice non bloody, intermittent "anal leakage" that was daily for about one month, minor overall but caused skin irritation to his buttocks. No recent anal leakage.   He continues to smoke. Denies family history of colon cancer.   Review of Systems  Respiratory: Negative for shortness of breath.   Gastrointestinal: Negative for abdominal pain.       Rectal bleeding after bowel movement   Skin: Negative for color change.  Neurological: Negative for weakness.       Past Medical History:  Diagnosis Date  . Arthritis   . Chronic systolic CHF (congestive heart failure) ([place])    a. echo 6/16: EF 55-60%, Gr1DD, LA nl, PASP nl; b. echo 7/19: EF 40-45%, unable to exclude RWMA, Gr1DD, LA nl, RVSF nl, PASP nl  . COPD (chronic obstructive pulmonary disease) ([place])   . Depression   . Diverticulosis    Found during colonoscopy  . Hypertension   . Kidney stones   . PAF  (paroxysmal atrial fibrillation) ([place])    a. short episodes previously noted on PPM interrogation; b. started on Xarelto 07/2017 given further episodes of Afib noted during interrogation; c. CHADS2VASc at least 3 (CHF, HTN, vascualr disease with aortic calcifications noted on CT)  . 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 Presence of permanent cardiac pacemaker    a. Biotronik; Dayna Barker DR-T [id number]; placed 08/2012  . Sleep apnea   . Symptomatic bradycardia    a. s/p Biotronik; Dayna Barker DR-T [id number]; placed 08/2012 at [place] History  . Marital status: Single    Spouse name: Not on file  . Number of children: Not on file  . Years of education: Not on file  . Highest education level: Not on file  Occupational History  . Not on file  Social Needs  . Financial resource strain: Not on file  . Food insecurity    Worry: Not on file    Inability: Not on file  . Transportation needs    Medical: Not on file    Non-medical: Not on file  Tobacco Use  . Smoking status: Heavy Tobacco Smoker    Packs/day: 2.00    Years: 50.00    Pack years: 100.00    Types: Cigarettes    Start date: 1964  . Smokeless tobacco: Never Used  . Tobacco comment: started smoking at age 13, 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  Substance and Sexual Activity  . Alcohol use: Yes    Alcohol/week: 8.0 - 14.0 standard drinks    Types: 8 - 14 Cans of beer per week  . Drug use: Yes    Types: Marijuana  . Sexual activity: Not on file  Lifestyle  . Physical activity    Days per week: Not on file    Minutes per session: Not on file  . Stress: Not on file  Relationships  . Social Herbalist on phone: Not on file    Gets together: Not on file    Attends religious service: Not on file    Active member of club or organization: Not on file    Attends meetings of clubs or organizations: Not on file    Relationship status: Not on file  . Intimate partner violence    Fear of current or ex partner: Not  on file    Emotionally abused: Not on file    Physically abused: Not on file    Forced sexual activity: Not on file  Other Topics Concern  . Not on file  Social History Narrative  . Not on file    Past Surgical History:  Procedure Laterality Date  . APPENDECTOMY  age 86  . CARDIAC CATHETERIZATION     7 years ago  . COLONOSCOPY N/A 05/27/2014   Procedure: COLONOSCOPY;  Surgeon: Hulen Luster, MD;  Location: [facility] ENDOSCOPY;  Service: Gastroenterology;  Laterality: N/A;  . INSERT / REPLACE / REMOVE PACEMAKER     3 years ago    Family History  Problem Relation Age of Onset  . Arthritis Mother   . Heart disease Father   . Hyperlipidemia Father   . Hypertension Father   . Cancer Paternal Grandfather     Allergies  Allergen Reactions  . Simvastatin Diarrhea  . Lisinopril Cough    Current Outpatient Medications on File Prior to Visit  Medication Sig Dispense Refill  . albuterol (PROAIR HFA) 108 (90 Base) MCG/ACT inhaler Inhale 2 puffs into the lungs every 6 (six) hours as needed for wheezing or shortness of breath. 18 g 2  . atorvastatin (LIPITOR) 20 MG tablet Take 1 tablet by mouth in the evening 90 tablet 0  . budesonide-formoterol (SYMBICORT) 160-4.5 MCG/ACT inhaler Inhale 2 puffs by mouth twice daily 3 Inhaler 1  . carvedilol (COREG) 3.125 MG tablet Take 1 tablet by mouth twice daily 180 tablet 0  . ezetimibe (ZETIA) 10 MG tablet Take 1 tablet (10 mg total) by mouth daily. 90 tablet 3  . gabapentin (NEURONTIN) 100 MG capsule Take 100 mg by mouth 2 (two) times daily as needed.     . loratadine (CLARITIN) 10 MG tablet Take 10 mg by mouth daily as needed for allergies.     Marland Kitchen telmisartan-hydrochlorothiazide (MICARDIS HCT) 40-12.5 MG tablet Take 1 tablet (40/12.5 mg) by mouth once daily 90 tablet 3   No current facility-administered medications on file prior to visit.     BP 130/86   Pulse 68   Temp 97.9 F (36.6 C) (Temporal)   Ht 5\' 5"  (1.651 m)   Wt 187 lb (84.8 kg)   SpO2  97%   BMI 31.12 kg/m    Objective:   Physical Exam  Constitutional: He appears well-nourished.  Respiratory: Effort normal.  Genitourinary: Rectum:     Guaiac result negative.     Internal hemorrhoid present.     No rectal mass, external hemorrhoid or abnormal anal  tone.     Genitourinary Comments: Chaperone present.   Skin: Skin is warm and dry.           Assessment & Plan:

## 2018-09-19 NOTE — Assessment & Plan Note (Signed)
Occurred after firm bowel movement that was hard to pass. Characteristics of situation are representative of hemorrhoid from irritation after bowel movement. Irritation noted to rectum without obvious external hemorrhoid, perhaps small internal hemorrhoid was noted. Hemoccult stool card today was negative.  Given that he's not experienced rectal bleeding since the second bowel movement and lack of alarm signs, we will have him monitor for symptoms and report any continued rectal bleeding.   Colonoscopy from 2016 grossly unremarkable, due again in 2026.

## 2018-09-19 NOTE — Patient Instructions (Signed)
Please notify me if your bleeding occurs outside of having a firm/hard to pass bowel movement.  You can try some preporation H cream as needed for rectal discomfort.   It was a pleasure to see you today!   Hemorrhoids Hemorrhoids are swollen veins that may develop:  In the butt (rectum). These are called internal hemorrhoids.  Around the opening of the butt (anus). These are called external hemorrhoids. Hemorrhoids can cause pain, itching, or bleeding. Most of the time, they do not cause serious problems. They usually get better with diet changes, lifestyle changes, and other home treatments. What are the causes? This condition may be caused by:  Having trouble pooping (constipation).  Pushing hard (straining) to poop.  Watery poop (diarrhea).  Pregnancy.  Being very overweight (obese).  Sitting for long periods of time.  Heavy lifting or other activity that causes you to strain.  Anal sex.  Riding a bike for a long period of time. What are the signs or symptoms? Symptoms of this condition include:  Pain.  Itching or soreness in the butt.  Bleeding from the butt.  Leaking poop.  Swelling in the area.  One or more lumps around the opening of your butt. How is this diagnosed? A doctor can often diagnose this condition by looking at the affected area. The doctor may also:  Do an exam that involves feeling the area with a gloved hand (digital rectal exam).  Examine the area inside your butt using a small tube (anoscope).  Order blood tests. This may be done if you have lost a lot of blood.  Have you get a test that involves looking inside the colon using a flexible tube with a camera on the end (sigmoidoscopy or colonoscopy). How is this treated? This condition can usually be treated at home. Your doctor may tell you to change what you eat, make lifestyle changes, or try home treatments. If these do not help, procedures can be done to remove the hemorrhoids or  make them smaller. These may involve:  Placing rubber bands at the base of the hemorrhoids to cut off their blood supply.  Injecting medicine into the hemorrhoids to shrink them.  Shining a type of light energy onto the hemorrhoids to cause them to fall off.  Doing surgery to remove the hemorrhoids or cut off their blood supply. Follow these instructions at home: Eating and drinking   Eat foods that have a lot of fiber in them. These include whole grains, beans, nuts, fruits, and vegetables.  Ask your doctor about taking products that have added fiber (fibersupplements).  Reduce the amount of fat in your diet. You can do this by: ? Eating low-fat dairy products. ? Eating less red meat. ? Avoiding processed foods.  Drink enough fluid to keep your pee (urine) pale yellow. Managing pain and swelling   Take a warm-water bath (sitz bath) for 20 minutes to ease pain. Do this 3-4 times a day. You may do this in a bathtub or using a portable sitz bath that fits over the toilet.  If told, put ice on the painful area. It may be helpful to use ice between your warm baths. ? Put ice in a plastic bag. ? Place a towel between your skin and the bag. ? Leave the ice on for 20 minutes, 2-3 times a day. General instructions  Take over-the-counter and prescription medicines only as told by your doctor. ? Medicated creams and medicines may be used as told.  Exercise  often. Ask your doctor how much and what kind of exercise is best for you.  Go to the bathroom when you have the urge to poop. Do not wait.  Avoid pushing too hard when you poop.  Keep your butt dry and clean. Use wet toilet paper or moist towelettes after pooping.  Do not sit on the toilet for a long time.  Keep all follow-up visits as told by your doctor. This is important. Contact a doctor if you:  Have pain and swelling that do not get better with treatment or medicine.  Have trouble pooping.  Cannot poop.  Have  pain or swelling outside the area of the hemorrhoids. Get help right away if you have:  Bleeding that will not stop. Summary  Hemorrhoids are swollen veins in the butt or around the opening of the butt.  They can cause pain, itching, or bleeding.  Eat foods that have a lot of fiber in them. These include whole grains, beans, nuts, fruits, and vegetables.  Take a warm-water bath (sitz bath) for 20 minutes to ease pain. Do this 3-4 times a day. This information is not intended to replace advice given to you by your health care provider. Make sure you discuss any questions you have with your health care provider. Document Released: 10/21/2007 Document Revised: 01/19/2018 Document Reviewed: 06/02/2017 Elsevier Patient Education  2020 [employer].

## 2018-09-20 ENCOUNTER — Encounter: Payer: Self-pay | Admitting: Cardiology

## 2018-09-20 NOTE — Progress Notes (Signed)
Remote pacemaker transmission.   

## 2018-09-22 ENCOUNTER — Telehealth: Payer: Self-pay

## 2018-09-22 MED ORDER — ALBUTEROL SULFATE HFA 108 (90 BASE) MCG/ACT IN AERS: 2.0000 | INHALATION_SPRAY | Freq: Four times a day (QID) | RESPIRATORY_TRACT | 2 refills | Status: DC | PRN

## 2018-09-22 NOTE — Telephone Encounter (Signed)
Spoken to patient and refill sent as requested.

## 2018-09-22 NOTE — Telephone Encounter (Signed)
Pt called triage line stating he called [facility] asking for a refill of his albuterol inhaler twice in the last 2 days. I did not see an electronic refill request. Pt is asking if [name] will send a refill to [place]. Please call pt if you have questions at [phone].

## 2018-10-17 ENCOUNTER — Other Ambulatory Visit: Payer: Self-pay | Admitting: Internal Medicine

## 2018-10-18 NOTE — Telephone Encounter (Signed)
Scheduled 10/13 w/ Caryl Comes

## 2018-10-18 NOTE — Telephone Encounter (Signed)
Please schedule 12 mo F/U with Dr. Caryl Comes. Thank you!

## 2018-10-18 NOTE — Telephone Encounter (Signed)
lvm for patient to callback and schedule fu

## 2018-10-19 ENCOUNTER — Other Ambulatory Visit: Payer: Self-pay | Admitting: Internal Medicine

## 2018-10-30 ENCOUNTER — Other Ambulatory Visit: Payer: Self-pay | Admitting: Cardiovascular Disease

## 2018-10-31 ENCOUNTER — Other Ambulatory Visit: Payer: Self-pay | Admitting: Primary Care

## 2018-10-31 DIAGNOSIS — J449 Chronic obstructive pulmonary disease, unspecified: Secondary | ICD-10-CM

## 2018-11-07 ENCOUNTER — Ambulatory Visit (INDEPENDENT_AMBULATORY_CARE_PROVIDER_SITE_OTHER): Payer: PPO | Admitting: Internal Medicine

## 2018-11-07 ENCOUNTER — Other Ambulatory Visit: Payer: Self-pay

## 2018-11-07 ENCOUNTER — Encounter: Payer: Self-pay | Admitting: Internal Medicine

## 2018-11-07 VITALS — BP 130/86 | HR 77 | Temp 97.0°F | Ht 66.0 in | Wt 184.5 lb

## 2018-11-07 DIAGNOSIS — I495 Sick sinus syndrome: Secondary | ICD-10-CM | POA: Diagnosis not present

## 2018-11-07 DIAGNOSIS — I480 Paroxysmal atrial fibrillation: Secondary | ICD-10-CM | POA: Diagnosis not present

## 2018-11-07 DIAGNOSIS — Z950 Presence of cardiac pacemaker: Secondary | ICD-10-CM | POA: Diagnosis not present

## 2018-11-07 LAB — CUP PACEART INCLINIC DEVICE CHECK
Brady Statistic RA Percent Paced: 91 %
Brady Statistic RV Percent Paced: 2 %
Date Time Interrogation Session: 20201013103051
Implantable Lead Implant Date: 20140801
Implantable Lead Implant Date: 20140801
Implantable Lead Location: 753859
Implantable Lead Location: 753860
Implantable Lead Serial Number: 29356730
Implantable Lead Serial Number: 29392661
Implantable Pulse Generator Implant Date: 20140801
Lead Channel Impedance Value: 565 Ohm
Lead Channel Impedance Value: 585 Ohm
Lead Channel Pacing Threshold Amplitude: 0.7 V
Lead Channel Pacing Threshold Amplitude: 0.8 V
Lead Channel Pacing Threshold Pulse Width: 0.4 ms
Lead Channel Pacing Threshold Pulse Width: 0.4 ms
Lead Channel Sensing Intrinsic Amplitude: 3.4 mV
Lead Channel Sensing Intrinsic Amplitude: 6.8 mV
Lead Channel Setting Pacing Amplitude: 1.8 V
Lead Channel Setting Pacing Amplitude: 2.4 V
Lead Channel Setting Pacing Pulse Width: 0.4 ms
Lead Channel Setting Sensing Sensitivity: 2.5 mV
Pulse Gen Serial Number: 68053398

## 2018-11-07 MED ORDER — CARVEDILOL 3.125 MG PO TABS: 3.1250 mg | ORAL_TABLET | Freq: Two times a day (BID) | ORAL | 3 refills | Status: DC

## 2018-11-07 MED ORDER — TELMISARTAN-HCTZ 40-12.5 MG PO TABS: ORAL_TABLET | ORAL | 3 refills | Status: DC

## 2018-11-07 NOTE — Patient Instructions (Signed)
Medication Instructions:  - Your physician recommends that you continue on your current medications as directed. Please refer to the Current Medication list given to you today.  If you need a refill on your cardiac medications before your next appointment, please call your pharmacy.   Lab work: - none ordered  If you have labs (blood work) drawn today and your tests are completely normal, you will receive your results only by: Marland Kitchen MyChart Message (if you have MyChart) OR . A paper copy in the mail If you have any lab test that is abnormal or we need to change your treatment, we will call you to review the results.  Testing/Procedures: - none ordered  Follow-Up: At [facility], you and your health needs are our priority.  As part of our continuing mission to provide you with exceptional heart care, we have created designated Provider Care Teams.  These Care Teams include your primary Cardiologist (physician) and Advanced Practice Providers (APPs -  Physician Assistants and Nurse Practitioners) who all work together to provide you with the care you need, when you need it.  - You will need a follow up appointment in 1 year (October 2021) with Dr. Caryl Comes.    - Please call our office 2 months in advance to schedule this appointment.  (Call in early August 2021 to schedule)  - Remote monitoring is used to monitor your Pacemaker of ICD from home. This monitoring reduces the number of office visits required to check your device to one time per year. It allows [place] to keep an eye on the functioning of your device to ensure it is working properly. You are scheduled for a device check from home on 12/12/18. You may send your transmission at any time that day. If you have a wireless device, the transmission will be sent automatically. After your physician reviews your transmission, you will receive a postcard with your next transmission date.   Any Other Special Instructions Will Be Listed Below (If  Applicable). - N/A

## 2018-11-07 NOTE — Progress Notes (Signed)
Patient Care Team: Pleas Koch, NP as PCP - General (Nurse Practitioner)   HPI  Brandon Lopez is a 63 y.o. male Seen in follow-up for pacemaker implanted at [facility] for dyspnea and presyncope. Also hx of Afib, but duration < 12 hrs, I had Rx has Subclinical Atrial fibrillation present     Records and Results Reviewed DATE TEST    6/16    Echo   EF 55-60 %  normal  5/16    Myoview   EF 50 %   No ischemia Echo done to clarify EF   7/19  Echo  EF 40-45%     Date Cr K Hgb  11/19 0.76 3.9 15.7<<17.2           Hospitalized 7/19 for an unusual episode where he had some abdominal discomfort tingling in his chest and not feeling right.  EMS was called.  Notes not available.  There was a comment about unresponsive and cyanotic.  The day before he had had atrial fibrillation for about 6 hours.   Because of the atrial fibrillation he was started on anticoagulation and his aspirin was discontinued.        Past Medical History:  Diagnosis Date  . Arthritis   . Chronic systolic CHF (congestive heart failure) ([place])    a. echo 6/16: EF 55-60%, Gr1DD, LA nl, PASP nl; b. echo 7/19: EF 40-45%, unable to exclude RWMA, Gr1DD, LA nl, RVSF nl, PASP nl  . COPD (chronic obstructive pulmonary disease) ([place])   . Depression   . Diverticulosis    Found during colonoscopy  . Hypertension   . Kidney stones   . PAF (paroxysmal atrial fibrillation) ([place])    a. short episodes previously noted on PPM interrogation; b. started on Xarelto 07/2017 given further episodes of Afib noted during interrogation; c. CHADS2VASc at least 3 (CHF, HTN, vascualr disease with aortic calcifications noted on CT)  . 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 Presence of permanent cardiac pacemaker    a. Biotronik; Dayna Barker DR-T [id number]; placed 08/2012  . Sleep apnea   . Symptomatic bradycardia    a. s/p Biotronik; Dayna Barker DR-T [id number]; placed 08/2012 at [facility]    Past Surgical History:  Procedure Laterality Date  .  APPENDECTOMY  age 67  . CARDIAC CATHETERIZATION     7 years ago  . COLONOSCOPY N/A 05/27/2014   Procedure: COLONOSCOPY;  Surgeon: Hulen Luster, MD;  Location: [facility] ENDOSCOPY;  Service: Gastroenterology;  Laterality: N/A;  . INSERT / REPLACE / REMOVE PACEMAKER     3 years ago    Current Outpatient Medications  Medication Sig Dispense Refill  . albuterol (PROAIR HFA) 108 (90 Base) MCG/ACT inhaler Inhale 2 puffs into the lungs every 6 (six) hours as needed for wheezing or shortness of breath. 18 g 2  . atorvastatin (LIPITOR) 20 MG tablet Take 1 tablet by mouth in the evening 90 tablet 0  . carvedilol (COREG) 3.125 MG tablet Take 1 tablet by mouth twice daily 180 tablet 0  . ezetimibe (ZETIA) 10 MG tablet Take 1 tablet by mouth once daily 30 tablet 0  . gabapentin (NEURONTIN) 100 MG capsule Take 100 mg by mouth 2 (two) times daily as needed.     . loratadine (CLARITIN) 10 MG tablet Take 10 mg by mouth daily as needed for allergies.     . SYMBICORT 160-4.5 MCG/ACT inhaler Inhale 2 puffs by mouth twice daily 33 g 0  . telmisartan-hydrochlorothiazide (  MICARDIS HCT) 40-12.5 MG tablet Take 1 tablet by mouth once daily 30 tablet 0   No current facility-administered medications for this visit.     Allergies  Allergen Reactions  . Simvastatin Diarrhea  . Lisinopril Cough      Review of Systems negative except from HPI and PMH  Physical Exam BP 130/86 (BP Location: Left Arm, Patient Position: Sitting, Cuff Size: Normal)   Pulse 77   Temp (!) 97 F (36.1 C)   Ht 5\' 6"  (1.676 m)   Wt 184 lb 8 oz (83.7 kg)   BMI 29.78 kg/m  Well developed and well nourished in no acute distress HENT normal Neck supple with JVP-flat Clear Device pocket well healed; without hematoma or erythema.  There is no tethering  Regular rate and rhythm, no  gallop murmur Abd-soft with active BS No Clubbing cyanosis  edema Skin-warm and dry A & Oriented  Grossly normal sensory and motor function  ECG sinus @ 77  25/09/38   Assessment and  Plan  Sinus bradycardia / 1AVB  Pacemaker-Biotronik    Atrial fibrillation-paroxysmal SCAF  Dyspnea on exertion  Chest pain    Hypertension  Cardiomyopathy-mild  Orthostatic lightheadedness  PVC   Cold intolerance  Polycythemia   Blood pressure well controlled  Less LH with interval programming adjustments and decrease in his [place]is  Euvolemic continue current meds  No symptoms with PVC-- burden about 5 %-- continue carvedilol  Some SCAF   Has blood work scheduled for PCP next month -- to review polycythemia, renal function on ARBs and thyroid        Current medicines are reviewed at length with the patient today .  The patient does not  have concerns regarding medicines.

## 2018-11-28 ENCOUNTER — Other Ambulatory Visit: Payer: Self-pay | Admitting: Cardiovascular Disease

## 2018-11-29 NOTE — Telephone Encounter (Signed)
Please schedule overdue 12 mo F/U with Dr. Rockey Situ. Thank you!

## 2018-11-29 NOTE — Telephone Encounter (Signed)
LMOV to schedule  

## 2018-12-01 ENCOUNTER — Other Ambulatory Visit: Payer: Self-pay

## 2018-12-11 ENCOUNTER — Ambulatory Visit: Payer: PPO

## 2018-12-11 ENCOUNTER — Ambulatory Visit (INDEPENDENT_AMBULATORY_CARE_PROVIDER_SITE_OTHER): Payer: PPO

## 2018-12-11 DIAGNOSIS — Z0000 Encounter for general adult medical examination without abnormal findings: Secondary | ICD-10-CM

## 2018-12-11 NOTE — Patient Instructions (Signed)
Brandon Lopez , Thank you for taking time to come for your Medicare Wellness Visit. I appreciate your ongoing commitment to your health goals. Please review the following plan we discussed and let me know if I can assist you in the future.   Screening recommendations/referrals: Colonoscopy: Up to date, completed 05/27/2014 Recommended yearly ophthalmology/optometry visit for glaucoma screening and checkup Recommended yearly dental visit for hygiene and checkup  Vaccinations: Influenza vaccine: will get at his physical  Pneumococcal vaccine: age 59 Tdap vaccine: Up to date, completed 10/26/2011 Shingles vaccine:  Check with insurance   Advanced directives: Advance directive discussed with you today. I have provided a copy for you to complete at home and have notarized. Once this is complete please bring a copy in to our office so we can scan it into your chart.  Conditions/risks identified: hypertension, hyperlipidemia  Next appointment: 12/12/2018 @ 9:40 am   Preventive Care 40-64 Years, Male Preventive care refers to lifestyle choices and visits with your health care provider that can promote health and wellness. What does preventive care include?  A yearly physical exam. This is also called an annual well check.  Dental exams once or twice a year.  Routine eye exams. Ask your health care provider how often you should have your eyes checked.  Personal lifestyle choices, including:  Daily care of your teeth and gums.  Regular physical activity.  Eating a healthy diet.  Avoiding tobacco and drug use.  Limiting alcohol use.  Practicing safe sex.  Taking low-dose aspirin every day starting at age 31. What happens during an annual well check? The services and screenings done by your health care provider during your annual well check will depend on your age, overall health, lifestyle risk factors, and family history of disease. Counseling  Your health care provider may ask you  questions about your:  Alcohol use.  Tobacco use.  Drug use.  Emotional well-being.  Home and relationship well-being.  Sexual activity.  Eating habits.  Work and work Statistician. Screening  You may have the following tests or measurements:  Height, weight, and BMI.  Blood pressure.  Lipid and cholesterol levels. These may be checked every 5 years, or more frequently if you are over 66 years old.  Skin check.  Lung cancer screening. You may have this screening every year starting at age 22 if you have a 30-pack-year history of smoking and currently smoke or have quit within the past 15 years.  Fecal occult blood test (FOBT) of the stool. You may have this test every year starting at age 43.  Flexible sigmoidoscopy or colonoscopy. You may have a sigmoidoscopy every 5 years or a colonoscopy every 10 years starting at age 30.  Prostate cancer screening. Recommendations will vary depending on your family history and other risks.  Hepatitis C blood test.  Hepatitis B blood test.  Sexually transmitted disease (STD) testing.  Diabetes screening. This is done by checking your blood sugar (glucose) after you have not eaten for a while (fasting). You may have this done every 1-3 years. Discuss your test results, treatment options, and if necessary, the need for more tests with your health care provider. Vaccines  Your health care provider may recommend certain vaccines, such as:  Influenza vaccine. This is recommended every year.  Tetanus, diphtheria, and acellular pertussis (Tdap, Td) vaccine. You may need a Td booster every 10 years.  Zoster vaccine. You may need this after age 76.  Pneumococcal 13-valent conjugate (PCV13) vaccine. You  may need this if you have certain conditions and have not been vaccinated.  Pneumococcal polysaccharide (PPSV23) vaccine. You may need one or two doses if you smoke cigarettes or if you have certain conditions. Talk to your health care  provider about which screenings and vaccines you need and how often you need them. This information is not intended to replace advice given to you by your health care provider. Make sure you discuss any questions you have with your health care provider. Document Released: 02/07/2015 Document Revised: 10/01/2015 Document Reviewed: 11/12/2014 Elsevier Interactive Patient Education  2017 [place] Prevention in the Home Falls can cause injuries. They can happen to people of all ages. There are many things you can do to make your home safe and to help prevent falls. What can I do on the outside of my home?  Regularly fix the edges of walkways and driveways and fix any cracks.  Remove anything that might make you trip as you walk through a door, such as a raised step or threshold.  Trim any bushes or trees on the path to your home.  Use bright outdoor lighting.  Clear any walking paths of anything that might make someone trip, such as rocks or tools.  Regularly check to see if handrails are loose or broken. Make sure that both sides of any steps have handrails.  Any raised decks and porches should have guardrails on the edges.  Have any leaves, snow, or ice cleared regularly.  Use sand or salt on walking paths during winter.  Clean up any spills in your garage right away. This includes oil or grease spills. What can I do in the bathroom?  Use night lights.  Install grab bars by the toilet and in the tub and shower. Do not use towel bars as grab bars.  Use non-skid mats or decals in the tub or shower.  If you need to sit down in the shower, use a plastic, non-slip stool.  Keep the floor dry. Clean up any water that spills on the floor as soon as it happens.  Remove soap buildup in the tub or shower regularly.  Attach bath mats securely with double-sided non-slip rug tape.  Do not have throw rugs and other things on the floor that can make you trip. What can I do in  the bedroom?  Use night lights.  Make sure that you have a light by your bed that is easy to reach.  Do not use any sheets or blankets that are too big for your bed. They should not hang down onto the floor.  Have a firm chair that has side arms. You can use this for support while you get dressed.  Do not have throw rugs and other things on the floor that can make you trip. What can I do in the kitchen?  Clean up any spills right away.  Avoid walking on wet floors.  Keep items that you use a lot in easy-to-reach places.  If you need to reach something above you, use a strong step stool that has a grab bar.  Keep electrical cords out of the way.  Do not use floor polish or wax that makes floors slippery. If you must use wax, use non-skid floor wax.  Do not have throw rugs and other things on the floor that can make you trip. What can I do with my stairs?  Do not leave any items on the stairs.  Make sure that there  are handrails on both sides of the stairs and use them. Fix handrails that are broken or loose. Make sure that handrails are as long as the stairways.  Check any carpeting to make sure that it is firmly attached to the stairs. Fix any carpet that is loose or worn.  Avoid having throw rugs at the top or bottom of the stairs. If you do have throw rugs, attach them to the floor with carpet tape.  Make sure that you have a light switch at the top of the stairs and the bottom of the stairs. If you do not have them, ask someone to add them for you. What else can I do to help prevent falls?  Wear shoes that:  Do not have high heels.  Have rubber bottoms.  Are comfortable and fit you well.  Are closed at the toe. Do not wear sandals.  If you use a stepladder:  Make sure that it is fully opened. Do not climb a closed stepladder.  Make sure that both sides of the stepladder are locked into place.  Ask someone to hold it for you, if possible.  Clearly mark and  make sure that you can see:  Any grab bars or handrails.  First and last steps.  Where the edge of each step is.  Use tools that help you move around (mobility aids) if they are needed. These include:  Canes.  Walkers.  Scooters.  Crutches.  Turn on the lights when you go into a dark area. Replace any light bulbs as soon as they burn out.  Set up your furniture so you have a clear path. Avoid moving your furniture around.  If any of your floors are uneven, fix them.  If there are any pets around you, be aware of where they are.  Review your medicines with your doctor. Some medicines can make you feel dizzy. This can increase your chance of falling. Ask your doctor what other things that you can do to help prevent falls. This information is not intended to replace advice given to you by your health care provider. Make sure you discuss any questions you have with your health care provider. Document Released: 11/07/2008 Document Revised: 06/19/2015 Document Reviewed: 02/15/2014 Elsevier Interactive Patient Education  2017 [employer].

## 2018-12-11 NOTE — Progress Notes (Signed)
PCP notes:  Health Maintenance: Needs flu vaccine   Abnormal Screenings: none   Patient concerns: Wants thyroid levels checked because he stays cold all the time   Nurse concerns: none   Next PCP appt.: 12/12/2018 @ 9:40 am

## 2018-12-11 NOTE — Progress Notes (Signed)
Subjective:   Brandon Lopez is a 63 y.o. male who presents for Medicare Annual/Subsequent preventive examination.  Review of Systems: N/A   This visit is being conducted through telemedicine via telephone at the nurse health advisor's home address due to the COVID-19 pandemic. This patient has given me verbal consent via doximity to conduct this visit, patient states they are participating from their home address. Patient and myself are on the telephone call. There is no referral for this visit. Some vital signs may be absent or patient reported.    Patient identification: identified by name, DOB, and current address   Cardiac Risk Factors include: advanced age (>73men, >54 women);male gender;hypertension;dyslipidemia;smoking/ tobacco exposure     Objective:    Vitals: There were no vitals taken for this visit.  There is no height or weight on file to calculate BMI.  Advanced Directives 12/11/2018 12/06/2017 08/10/2017 11/05/2015 05/27/2014  Does Patient Have a Medical Advance Directive? No Yes No No No  Type of Advance Directive - Healthcare Power of [place];Living will - - -  Copy of [place] in Chart? - No - copy requested - - -  Would patient like information on creating a medical advance directive? Yes (MAU/Ambulatory/Procedural Areas - Information given) - No - Patient declined Yes - Educational materials given No - patient declined information    Tobacco Social History   Tobacco Use  Smoking Status Heavy Tobacco Smoker  . Packs/day: 2.00  . Years: 50.00  . Pack years: 100.00  . Types: Cigarettes  . Start date: 1964  Smokeless Tobacco Never Used  Tobacco Comment   started smoking at age 64, 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     Ready to quit: Not Answered Counseling given: Not Answered Comment: started smoking at age 9, 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   Clinical Intake:  Pre-visit preparation completed: Yes  Pain : 0-10 Pain Score: 5  Pain  Type: Chronic pain Pain Location: Shoulder Pain Orientation: Right, Left Pain Descriptors / Indicators: Aching Pain Onset: More than a month ago Pain Frequency: Intermittent     Nutritional Risks: None Diabetes: No  How often do you need to have someone help you when you read instructions, pamphlets, or other written materials from your doctor or pharmacy?: 1 - Never What is the last grade level you completed in school?: GED  Interpreter Needed?: No  Information entered by :: CJohnson, LPN  Past Medical History:  Diagnosis Date  . Arthritis   . Chronic systolic CHF (congestive heart failure) ([place])    a. echo 6/16: EF 55-60%, Gr1DD, LA nl, PASP nl; b. echo 7/19: EF 40-45%, unable to exclude RWMA, Gr1DD, LA nl, RVSF nl, PASP nl  . COPD (chronic obstructive pulmonary disease) ([place])   . Depression   . Diverticulosis    Found during colonoscopy  . Hypertension   . Kidney stones   . PAF (paroxysmal atrial fibrillation) ([place])    a. short episodes previously noted on PPM interrogation; b. started on Xarelto 07/2017 given further episodes of Afib noted during interrogation; c. CHADS2VASc at least 3 (CHF, HTN, vascualr disease with aortic calcifications noted on CT)  . 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 Presence of permanent cardiac pacemaker    a. Biotronik; Dayna Barker DR-T [id number]; placed 08/2012  . Sleep apnea   . Symptomatic bradycardia    a. s/p Biotronik; Dayna Barker DR-T [id number]; placed 08/2012 at [facility]   Past Surgical History:  Procedure Laterality Date  . APPENDECTOMY  age 34  .  CARDIAC CATHETERIZATION     7 years ago  . COLONOSCOPY N/A 05/27/2014   Procedure: COLONOSCOPY;  Surgeon: Hulen Luster, MD;  Location[facility] ENDOSCOPY;  Service: Gastroenterology;  Laterality: N/A;  . INSERT / REPLACE / REMOVE PACEMAKER     3 years ago   Family History  Problem Relation Age of Onset  . Arthritis Mother   . Heart disease Father   . Hyperlipidemia Father   . Hypertension Father   . Cancer Paternal  Grandfather    Social History   Socioeconomic History  . Marital status: Single    Spouse name: Not on file  . Number of children: Not on file  . Years of education: Not on file  . Highest education level: Not on file  Occupational History  . Not on file  Social Needs  . Financial resource strain: Not hard at all  . Food insecurity    Worry: Never true    Inability: Never true  . Transportation needs    Medical: No    Non-medical: No  Tobacco Use  . Smoking status: Heavy Tobacco Smoker    Packs/day: 2.00    Years: 50.00    Pack years: 100.00    Types: Cigarettes    Start date: 1964  . Smokeless tobacco: Never Used  . Tobacco comment: started smoking at age 29, 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  Substance and Sexual Activity  . Alcohol use: Yes    Alcohol/week: 8.0 - 14.0 standard drinks    Types: 8 - 14 Cans of beer per week  . Drug use: Yes    Types: Marijuana  . Sexual activity: Not on file  Lifestyle  . Physical activity    Days per week: 0 days    Minutes per session: 0 min  . Stress: Not at all  Relationships  . Social Herbalist on phone: Not on file    Gets together: Not on file    Attends religious service: Not on file    Active member of club or organization: Not on file    Attends meetings of clubs or organizations: Not on file    Relationship status: Not on file  Other Topics Concern  . Not on file  Social History Narrative  . Not on file    Outpatient Encounter Medications as of 12/11/2018  Medication Sig  . albuterol (PROAIR HFA) 108 (90 Base) MCG/ACT inhaler Inhale 2 puffs into the lungs every 6 (six) hours as needed for wheezing or shortness of breath.  Marland Kitchen atorvastatin (LIPITOR) 20 MG tablet Take 1 tablet by mouth in the evening  . carvedilol (COREG) 3.125 MG tablet Take 1 tablet (3.125 mg total) by mouth 2 (two) times daily.  Marland Kitchen ezetimibe (ZETIA) 10 MG tablet Take 1 tablet by mouth once daily  . gabapentin (NEURONTIN) 100 MG capsule Take  100 mg by mouth 2 (two) times daily as needed.   . loratadine (CLARITIN) 10 MG tablet Take 10 mg by mouth daily as needed for allergies.   . SYMBICORT 160-4.5 MCG/ACT inhaler Inhale 2 puffs by mouth twice daily  . telmisartan-hydrochlorothiazide (MICARDIS HCT) 40-12.5 MG tablet Take 1 tablet by mouth once daily   No facility-administered encounter medications on file as of 12/11/2018.     Activities of Daily Living In your present state of health, do you have any difficulty performing the following activities: 12/11/2018  Hearing? N  Vision? N  Difficulty concentrating or making decisions? N  Walking or  climbing stairs? N  Dressing or bathing? N  Doing errands, shopping? N  Preparing Food and eating ? N  Using the Toilet? N  In the past six months, have you accidently leaked urine? N  Do you have problems with loss of bowel control? N  Managing your Medications? N  Managing your Finances? N  Housekeeping or managing your Housekeeping? N  Some recent data might be hidden    Patient Care Team: Pleas Koch, NP as PCP - General (Nurse Practitioner)   Assessment:   This is a routine wellness examination for Brandon Lopez.  Exercise Activities and Dietary recommendations Current Exercise Habits: The patient does not participate in regular exercise at present, Exercise limited by: None identified  Goals    . Increase water intake     Starting 12/06/2017, I will continue to drink 3-4 glasses of water daily.     . Patient Stated     12/11/2018, I will maintain and continue medications as prescribed.        Fall Risk Fall Risk  12/11/2018 12/06/2017 11/05/2015  Falls in the past year? 0 0 No  Number falls in past yr: 0 - -  Injury with Fall? 0 - -  Risk for fall due to : Medication side effect - -  Follow up Falls evaluation completed;Falls prevention discussed - -   Is the patient's home free of loose throw rugs in walkways, pet beds, electrical cords, etc?   yes      Grab  bars in the bathroom? no      Handrails on the stairs?   yes      Adequate lighting?   yes  Timed Get Up and Go Performed: N/A  Depression Screen PHQ 2/9 Scores 12/11/2018 12/06/2017 04/14/2016 11/05/2015  PHQ - 2 Score 0 0 6 0  PHQ- 9 Score 0 0 17 -    Cognitive Function MMSE - Mini Mental State Exam 12/11/2018 12/06/2017 11/05/2015  Orientation to time 5 5 5   Orientation to Place 5 5 5   Registration 3 3 3   Attention/ Calculation 5 0 0  Recall 3 3 3   Language- name 2 objects - 0 0  Language- repeat 1 1 1   Language- follow 3 step command - 3 3  Language- read & follow direction - 0 0  Write a sentence - 0 0  Copy design - 0 0  Total score - 20 20  Mini Cog  Mini-Cog screen was completed. Maximum score is 22. A value of 0 denotes this part of the MMSE was not completed or the patient failed this part of the Mini-Cog screening.       Immunization History  Administered Date(s) Administered  . Influenza,inj,Quad PF,6+ Mos 11/15/2014, 11/05/2015, 12/23/2016, 12/06/2017  . Pneumococcal Polysaccharide-23 11/04/2014  . Td 10/26/2011  . Zoster 11/18/2015    Qualifies for Shingles Vaccine? Yes  Screening Tests Health Maintenance  Topic Date Due  . INFLUENZA VACCINE  08/26/2018  . HIV Screening  11/04/2025 (Originally 09/11/1970)  . TETANUS/TDAP  10/25/2021  . COLONOSCOPY  05/26/2024  . Hepatitis C Screening  Completed   Cancer Screenings: Lung: Low Dose CT Chest recommended if Age 32-80 years, 30 pack-year currently smoking OR have quit w/in 15years. Patient does qualify and it was completed on 01/04/2018. Colorectal: completed 05/27/2014  Additional Screenings:  Hepatitis C Screening: 11/18/2015      Plan:    Patient will maintain and continue medications as prescribed.   I have personally reviewed  and noted the following in the patient's chart:   . Medical and social history . Use of alcohol, tobacco or illicit drugs  . Current medications and supplements .  Functional ability and status . Nutritional status . Physical activity . Advanced directives . List of other physicians . Hospitalizations, surgeries, and ER visits in previous 12 months . Vitals . Screenings to include cognitive, depression, and falls . Referrals and appointments  In addition, I have reviewed and discussed with patient certain preventive protocols, quality metrics, and best practice recommendations. A written personalized care plan for preventive services as well as general preventive health recommendations were provided to patient.     Andrez Grime, LPN  [id number]

## 2018-12-12 ENCOUNTER — Ambulatory Visit (INDEPENDENT_AMBULATORY_CARE_PROVIDER_SITE_OTHER)
Admission: RE | Admit: 2018-12-12 | Discharge: 2018-12-12 | Disposition: A | Discharge status: Home / Self Care | Payer: PPO | Source: Ambulatory Visit | Attending: Primary Care | Admitting: Primary Care

## 2018-12-12 ENCOUNTER — Ambulatory Visit (INDEPENDENT_AMBULATORY_CARE_PROVIDER_SITE_OTHER): Payer: PPO | Admitting: *Deleted

## 2018-12-12 ENCOUNTER — Other Ambulatory Visit: Payer: Self-pay

## 2018-12-12 ENCOUNTER — Encounter: Payer: Self-pay | Admitting: Primary Care

## 2018-12-12 ENCOUNTER — Ambulatory Visit (INDEPENDENT_AMBULATORY_CARE_PROVIDER_SITE_OTHER): Payer: PPO | Admitting: Primary Care

## 2018-12-12 ENCOUNTER — Ambulatory Visit: Payer: Self-pay

## 2018-12-12 VITALS — BP 126/82 | HR 76 | Temp 97.2°F | Ht 65.0 in | Wt 185.5 lb

## 2018-12-12 DIAGNOSIS — I10 Essential (primary) hypertension: Secondary | ICD-10-CM | POA: Diagnosis not present

## 2018-12-12 DIAGNOSIS — M159 Polyosteoarthritis, unspecified: Secondary | ICD-10-CM

## 2018-12-12 DIAGNOSIS — G8929 Other chronic pain: Secondary | ICD-10-CM

## 2018-12-12 DIAGNOSIS — F419 Anxiety disorder, unspecified: Secondary | ICD-10-CM

## 2018-12-12 DIAGNOSIS — F1010 Alcohol abuse, uncomplicated: Secondary | ICD-10-CM | POA: Diagnosis not present

## 2018-12-12 DIAGNOSIS — I480 Paroxysmal atrial fibrillation: Secondary | ICD-10-CM

## 2018-12-12 DIAGNOSIS — J449 Chronic obstructive pulmonary disease, unspecified: Secondary | ICD-10-CM | POA: Diagnosis not present

## 2018-12-12 DIAGNOSIS — Z23 Encounter for immunization: Secondary | ICD-10-CM

## 2018-12-12 DIAGNOSIS — M25519 Pain in unspecified shoulder: Secondary | ICD-10-CM | POA: Insufficient documentation

## 2018-12-12 DIAGNOSIS — G4733 Obstructive sleep apnea (adult) (pediatric): Secondary | ICD-10-CM

## 2018-12-12 DIAGNOSIS — M25512 Pain in left shoulder: Secondary | ICD-10-CM | POA: Diagnosis not present

## 2018-12-12 DIAGNOSIS — E785 Hyperlipidemia, unspecified: Secondary | ICD-10-CM

## 2018-12-12 DIAGNOSIS — R55 Syncope and collapse: Secondary | ICD-10-CM

## 2018-12-12 DIAGNOSIS — Z720 Tobacco use: Secondary | ICD-10-CM | POA: Diagnosis not present

## 2018-12-12 DIAGNOSIS — F329 Major depressive disorder, single episode, unspecified: Secondary | ICD-10-CM

## 2018-12-12 DIAGNOSIS — M8949 Other hypertrophic osteoarthropathy, multiple sites: Secondary | ICD-10-CM

## 2018-12-12 DIAGNOSIS — Z0000 Encounter for general adult medical examination without abnormal findings: Secondary | ICD-10-CM | POA: Diagnosis not present

## 2018-12-12 LAB — TSH: TSH: 1.93 u[IU]/mL (ref 0.35–4.50)

## 2018-12-12 LAB — COMPREHENSIVE METABOLIC PANEL
ALT: 26 U/L (ref 0–53)
AST: 16 U/L (ref 0–37)
Albumin: 4.3 g/dL (ref 3.5–5.2)
Alkaline Phosphatase: 64 U/L (ref 39–117)
BUN: 11 mg/dL (ref 6–23)
CO2: 26 mEq/L (ref 19–32)
Calcium: 9.2 mg/dL (ref 8.4–10.5)
Chloride: 103 mEq/L (ref 96–112)
Creatinine, Ser: 0.75 mg/dL (ref 0.40–1.50)
GFR: 105.1 mL/min (ref >60.00)
Glucose, Bld: 85 mg/dL (ref 70–99)
Potassium: 4.3 mEq/L (ref 3.5–5.1)
Sodium: 137 mEq/L (ref 135–145)
Total Bilirubin: 0.7 mg/dL (ref 0.2–1.2)
Total Protein: 6.7 g/dL (ref 6.0–8.3)

## 2018-12-12 LAB — CBC
HCT: 46.2 % (ref 39.0–52.0)
Hemoglobin: 16 g/dL (ref 13.0–17.0)
MCHC: 34.6 g/dL (ref 30.0–36.0)
MCV: 95 fl (ref 78.0–100.0)
Platelets: 217 10*3/uL (ref 150.0–400.0)
RBC: 4.86 Mil/uL (ref 4.22–5.81)
RDW: 12.7 % (ref 11.5–15.5)
WBC: 8.8 10*3/uL (ref 4.0–10.5)

## 2018-12-12 LAB — LIPID PANEL
Cholesterol: 124 mg/dL (ref 0–200)
HDL: 37.9 mg/dL — ABNORMAL LOW (ref >39.00)
LDL Cholesterol: 71 mg/dL (ref 0–99)
NonHDL: 85.79
Total CHOL/HDL Ratio: 3
Triglycerides: 73 mg/dL (ref 0.0–149.0)
VLDL: 14.6 mg/dL (ref 0.0–40.0)

## 2018-12-12 NOTE — Assessment & Plan Note (Signed)
Immunizations UTD. PSA UTD. Colonoscopy UTD, due in 2026. Lung cancer screening pending. Encouraged a healthy diet with regular exercise. Exam today stable. Labs pending.

## 2018-12-12 NOTE — Assessment & Plan Note (Addendum)
Stable in the office today, continue to monitor. Continue carvedilol and Micardis.  CMP pending.

## 2018-12-12 NOTE — Assessment & Plan Note (Signed)
Suspect this is contributing to chronic left shoulder pain. Check plain films today.  Consider PT vs Sports Medicine evaluation.

## 2018-12-12 NOTE — Assessment & Plan Note (Addendum)
Rate and rhythm regular today. Pacemaker in place, following with Dr. Caryl Comes with last visit being in October 2020, pacemaker reviewed.   Continue carvedilol.

## 2018-12-12 NOTE — Telephone Encounter (Signed)
lmov to schedule  °

## 2018-12-12 NOTE — Assessment & Plan Note (Signed)
Not sleeping with Bipap or CPAP any longer.

## 2018-12-12 NOTE — Assessment & Plan Note (Addendum)
Chronic for years with pain and stiffness in the morning. Taking daily Advil, was on gabapentin without improvement.    Check plain films of the shoulder today. No major alarm signs today. Will refer to Sports Medicine for further evaluation.  Discussed to alternate with Tylenol.

## 2018-12-12 NOTE — Assessment & Plan Note (Signed)
Reduction in overall beer consumption and is down to less than 12 pack per day. Strongly advised he reduce/stop consumption.

## 2018-12-12 NOTE — Patient Instructions (Addendum)
Stop by the lab and xray prior to leaving today. I will notify you of your results once received.   Start exercising. You should be getting 150 minutes of moderate intensity exercise weekly.  It's important to improve your diet by reducing consumption of fast food, fried food, processed snack foods, sugary drinks. Increase consumption of fresh vegetables and fruits, whole grains, water.  Ensure you are drinking 64 ounces of water daily.  Reduce alcohol consumption.  Schedule a visit with Dr. Lorelei Pont as discussed.  It was a pleasure to see you today!   Preventive Care 63-63 Years Old, Male Preventive care refers to lifestyle choices and visits with your health care provider that can promote health and wellness. This includes:  A yearly physical exam. This is also called an annual well check.  Regular dental and eye exams.  Immunizations.  Screening for certain conditions.  Healthy lifestyle choices, such as eating a healthy diet, getting regular exercise, not using drugs or products that contain nicotine and tobacco, and limiting alcohol use. What can I expect for my preventive care visit? Physical exam Your health care provider will check:  Height and weight. These may be used to calculate body mass index (BMI), which is a measurement that tells if you are at a healthy weight.  Heart rate and blood pressure.  Your skin for abnormal spots. Counseling Your health care provider may ask you questions about:  Alcohol, tobacco, and drug use.  Emotional well-being.  Home and relationship well-being.  Sexual activity.  Eating habits.  Work and work Statistician. What immunizations do I need?  Influenza (flu) vaccine  This is recommended every year. Tetanus, diphtheria, and pertussis (Tdap) vaccine  You may need a Td booster every 10 years. Varicella (chickenpox) vaccine  You may need this vaccine if you have not already been vaccinated. Zoster (shingles)  vaccine  You may need this after age 49. Measles, mumps, and rubella (MMR) vaccine  You may need at least one dose of MMR if you were born in 1957 or later. You may also need a second dose. Pneumococcal conjugate (PCV13) vaccine  You may need this if you have certain conditions and were not previously vaccinated. Pneumococcal polysaccharide (PPSV23) vaccine  You may need one or two doses if you smoke cigarettes or if you have certain conditions. Meningococcal conjugate (MenACWY) vaccine  You may need this if you have certain conditions. Hepatitis A vaccine  You may need this if you have certain conditions or if you travel or work in places where you may be exposed to hepatitis A. Hepatitis B vaccine  You may need this if you have certain conditions or if you travel or work in places where you may be exposed to hepatitis B. Haemophilus influenzae type b (Hib) vaccine  You may need this if you have certain risk factors. Human papillomavirus (HPV) vaccine  If recommended by your health care provider, you may need three doses over 6 months. You may receive vaccines as individual doses or as more than one vaccine together in one shot (combination vaccines). Talk with your health care provider about the risks and benefits of combination vaccines. What tests do I need? Blood tests  Lipid and cholesterol levels. These may be checked every 5 years, or more frequently if you are over 70 years old.  Hepatitis C test.  Hepatitis B test. Screening  Lung cancer screening. You may have this screening every year starting at age 61 if you have a 30-pack-year  history of smoking and currently smoke or have quit within the past 15 years.  Prostate cancer screening. Recommendations will vary depending on your family history and other risks.  Colorectal cancer screening. All adults should have this screening starting at age 23 and continuing until age 31. Your health care provider may recommend  screening at age 81 if you are at increased risk. You will have tests every 1-10 years, depending on your results and the type of screening test.  Diabetes screening. This is done by checking your blood sugar (glucose) after you have not eaten for a while (fasting). You may have this done every 1-3 years.  Sexually transmitted disease (STD) testing. Follow these instructions at home: Eating and drinking  Eat a diet that includes fresh fruits and vegetables, whole grains, lean protein, and low-fat dairy products.  Take vitamin and mineral supplements as recommended by your health care provider.  Do not drink alcohol if your health care provider tells you not to drink.  If you drink alcohol: ? Limit how much you have to 0-2 drinks a day. ? Be aware of how much alcohol is in your drink. In the [place] one drink equals one 12 oz bottle of beer (355 mL), one 5 oz glass of wine (148 mL), or one 1 oz glass of hard liquor (44 mL). Lifestyle  Take daily care of your teeth and gums.  Stay active. Exercise for at least 30 minutes on 5 or more days each week.  Do not use any products that contain nicotine or tobacco, such as cigarettes, e-cigarettes, and chewing tobacco. If you need help quitting, ask your health care provider.  If you are sexually active, practice safe sex. Use a condom or other form of protection to prevent STIs (sexually transmitted infections).  Talk with your health care provider about taking a low-dose aspirin every day starting at age 72. What's next?  Go to your health care provider once a year for a well check visit.  Ask your health care provider how often you should have your eyes and teeth checked.  Stay up to date on all vaccines. This information is not intended to replace advice given to you by your health care provider. Make sure you discuss any questions you have with your health care provider. Document Released: 02/07/2015 Document Revised: 01/05/2018  Document Reviewed: 01/05/2018 Elsevier Patient Education  2020 [employer].

## 2018-12-12 NOTE — Assessment & Plan Note (Signed)
Denies concerns for anxiety and depression. Continue to monitor.

## 2018-12-12 NOTE — Assessment & Plan Note (Signed)
Doing well on daily Symbicort, using albuterol sparingly as needed. Lungs overall clear. Lung cancer screening pending for December 2020.

## 2018-12-12 NOTE — Progress Notes (Signed)
Subjective:    Patient ID: Brandon Lopez, male    DOB: 1955/04/12, 63 y.o.   MRN: [id number]  HPI  Brandon Lopez is a 63 year old male who presents today for complete physical.   He would also like to discuss chronic left shoulder pain. Chronic for years, previously seeing orthopedics who prescribed gabapentin. He is no longer taking gabapentin as it was like "eating candy". He's now taking three Advil every morning due to stiffness and pain. He denies recent injury/trauma, numbness tingling, decrease in ROM. He's had no recent evaluation of his symptoms.   Immunizations: -Tetanus: Completed in 2013 -Influenza: Due today -Shingles: Completed in 2017 -Pneumonia: Completed last in 2016  Diet: He endorses a fair diet. He is mostly eating hamburgers, steak, chicken, vegetables. Candy daily. He is drinking about 6-8 beers daily, water and coffee. Exercise: He is active on his land, no regular exercise  Eye exam: Completed three years ago Dental exam: No recent exam  Colonoscopy: Completed in 2016, due in 2026 PSA: 0.25 in 2019 Hep C Screen: Negative Lung Cancer Screening: Completes annually, due in December 2020.  BP Readings from Last 3 Encounters:  12/12/18 126/82  11/07/18 130/86  09/19/18 130/86     Review of Systems  Constitutional: Negative for unexpected weight change.  HENT: Negative for rhinorrhea.   Respiratory: Negative for cough and shortness of breath.   Cardiovascular: Negative for chest pain.  Gastrointestinal: Negative for constipation and diarrhea.  Genitourinary: Negative for difficulty urinating.  Musculoskeletal: Positive for arthralgias. Negative for myalgias.  Skin: Negative for rash.  Allergic/Immunologic: Negative for environmental allergies.  Neurological: Negative for dizziness, weakness, numbness and headaches.  Psychiatric/Behavioral: The patient is not nervous/anxious.        Past Medical History:  Diagnosis Date  . Arthritis   .  Chronic systolic CHF (congestive heart failure) ([place])    a. echo 6/16: EF 55-60%, Gr1DD, LA nl, PASP nl; b. echo 7/19: EF 40-45%, unable to exclude RWMA, Gr1DD, LA nl, RVSF nl, PASP nl  . COPD (chronic obstructive pulmonary disease) ([place])   . Depression   . Diverticulosis    Found during colonoscopy  . Hypertension   . Kidney stones   . PAF (paroxysmal atrial fibrillation) ([place])    a. short episodes previously noted on PPM interrogation; b. started on Xarelto 07/2017 given further episodes of Afib noted during interrogation; c. CHADS2VASc at least 3 (CHF, HTN, vascualr disease with aortic calcifications noted on CT)  . 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 Presence of permanent cardiac pacemaker    a. Biotronik; Dayna Barker DR-T [id number]; placed 08/2012  . Sleep apnea   . Symptomatic bradycardia    a. s/p Biotronik; Dayna Barker DR-T [id number]; placed 08/2012 at [place] History  . Marital status: Single    Spouse name: Not on file  . Number of children: Not on file  . Years of education: Not on file  . Highest education level: Not on file  Occupational History  . Not on file  Social Needs  . Financial resource strain: Not hard at all  . Food insecurity    Worry: Never true    Inability: Never true  . Transportation needs    Medical: No    Non-medical: No  Tobacco Use  . Smoking status: Heavy Tobacco Smoker    Packs/day: 2.00    Years: 50.00    Pack years: 100.00    Types: Cigarettes  Start date: 1964  . Smokeless tobacco: Never Used  . Tobacco comment: started smoking at age 51, 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  Substance and Sexual Activity  . Alcohol use: Yes    Alcohol/week: 8.0 - 14.0 standard drinks    Types: 8 - 14 Cans of beer per week  . Drug use: Yes    Types: Marijuana  . Sexual activity: Not on file  Lifestyle  . Physical activity    Days per week: 0 days    Minutes per session: 0 min  . Stress: Not at all  Relationships  . Social Product manager on phone: Not on file    Gets together: Not on file    Attends religious service: Not on file    Active member of club or organization: Not on file    Attends meetings of clubs or organizations: Not on file    Relationship status: Not on file  . Intimate partner violence    Fear of current or ex partner: No    Emotionally abused: No    Physically abused: No    Forced sexual activity: No  Other Topics Concern  . Not on file  Social History Narrative  . Not on file    Past Surgical History:  Procedure Laterality Date  . APPENDECTOMY  age 57  . CARDIAC CATHETERIZATION     7 years ago  . COLONOSCOPY N/A 05/27/2014   Procedure: COLONOSCOPY;  Surgeon: Hulen Luster, MD;  Location: [facility] ENDOSCOPY;  Service: Gastroenterology;  Laterality: N/A;  . INSERT / REPLACE / REMOVE PACEMAKER     3 years ago    Family History  Problem Relation Age of Onset  . Arthritis Mother   . Heart disease Father   . Hyperlipidemia Father   . Hypertension Father   . Cancer Paternal Grandfather     Allergies  Allergen Reactions  . Simvastatin Diarrhea  . Lisinopril Cough    Current Outpatient Medications on File Prior to Visit  Medication Sig Dispense Refill  . albuterol (PROAIR HFA) 108 (90 Base) MCG/ACT inhaler Inhale 2 puffs into the lungs every 6 (six) hours as needed for wheezing or shortness of breath. 18 g 2  . atorvastatin (LIPITOR) 20 MG tablet Take 1 tablet by mouth in the evening 90 tablet 0  . carvedilol (COREG) 3.125 MG tablet Take 1 tablet (3.125 mg total) by mouth 2 (two) times daily. 180 tablet 3  . ezetimibe (ZETIA) 10 MG tablet Take 1 tablet by mouth once daily 15 tablet 0  . loratadine (CLARITIN) 10 MG tablet Take 10 mg by mouth daily as needed for allergies.     . SYMBICORT 160-4.5 MCG/ACT inhaler Inhale 2 puffs by mouth twice daily 33 g 0  . telmisartan-hydrochlorothiazide (MICARDIS HCT) 40-12.5 MG tablet Take 1 tablet by mouth once daily 90 tablet 3   No current  facility-administered medications on file prior to visit.     BP 126/82   Pulse 76   Temp (!) 97.2 F (36.2 C) (Temporal)   Ht 5\' 5"  (1.651 m)   Wt 185 lb 8 oz (84.1 kg)   SpO2 97%   BMI 30.87 kg/m    Objective:   Physical Exam  Constitutional: He is oriented to person, place, and time. He appears well-nourished.  HENT:  Right Ear: Tympanic membrane and ear canal normal.  Left Ear: Tympanic membrane and ear canal normal.  Mouth/Throat: Oropharynx is clear and moist.  Eyes: Pupils are  equal, round, and reactive to light. EOM are normal.  Neck: Neck supple.  Cardiovascular: Normal rate and regular rhythm.  Respiratory: Effort normal and breath sounds normal.  GI: Soft. Bowel sounds are normal. There is no abdominal tenderness.  Musculoskeletal: Normal range of motion.     Comments: 5/5 strength to bilateral upper extremities.   Neurological: He is alert and oriented to person, place, and time.  Skin: Skin is warm and dry.  Psychiatric: He has a normal mood and affect.           Assessment & Plan:

## 2018-12-12 NOTE — Assessment & Plan Note (Signed)
Continues to smoke, not ready to quit. Pending low dose CT scan for screening.

## 2018-12-12 NOTE — Addendum Note (Signed)
Addended by: Jacqualin Combes on: 12/12/2018 02:51 PM   Modules accepted: Orders

## 2018-12-12 NOTE — Assessment & Plan Note (Signed)
Repeat lipids pending. Continue atorvastatin and Zetia.

## 2018-12-13 LAB — CUP PACEART REMOTE DEVICE CHECK
Date Time Interrogation Session: 20201118092607
Implantable Lead Implant Date: 20140801
Implantable Lead Implant Date: 20140801
Implantable Lead Location: 753859
Implantable Lead Location: 753860
Implantable Lead Serial Number: 29356730
Implantable Lead Serial Number: 29392661
Implantable Pulse Generator Implant Date: 20140801
Pulse Gen Serial Number: 68053398

## 2018-12-14 ENCOUNTER — Other Ambulatory Visit: Payer: Self-pay | Admitting: Cardiovascular Disease

## 2018-12-14 ENCOUNTER — Other Ambulatory Visit: Payer: Self-pay | Admitting: Primary Care

## 2018-12-14 DIAGNOSIS — E785 Hyperlipidemia, unspecified: Secondary | ICD-10-CM

## 2018-12-18 ENCOUNTER — Ambulatory Visit (INDEPENDENT_AMBULATORY_CARE_PROVIDER_SITE_OTHER): Payer: PPO | Admitting: Family Medicine

## 2018-12-18 ENCOUNTER — Encounter: Payer: Self-pay | Admitting: Family Medicine

## 2018-12-18 ENCOUNTER — Other Ambulatory Visit: Payer: Self-pay

## 2018-12-18 VITALS — BP 130/84 | HR 62 | Temp 98.3°F | Ht 65.0 in | Wt 184.0 lb

## 2018-12-18 DIAGNOSIS — M19012 Primary osteoarthritis, left shoulder: Secondary | ICD-10-CM

## 2018-12-18 MED ORDER — METHYLPREDNISOLONE ACETATE 40 MG/ML IJ SUSP
80.0000 mg | Freq: Once | INTRAMUSCULAR | Status: AC
  Administered 2018-12-18: 80 mg via INTRA_ARTICULAR

## 2018-12-18 NOTE — Addendum Note (Signed)
Addended by: Carter Kitten on: 12/18/2018 09:57 AM   Modules accepted: Orders

## 2018-12-18 NOTE — Progress Notes (Signed)
Brandon Pantoja T. Brandon Castagnola, MD Primary Care and Sports Medicine [facility] at [facility] [place], [address] Phone: [phone]  FAX: [phone]  Brandon Lopez - 63 y.o. male  MRN [id number]  Date of Birth: [birth year]  Visit Date: 12/18/2018  PCP: Pleas Koch, NP  Referred by: Pleas Koch, NP  Chief Complaint  Patient presents with  . Shoulder Pain    Left   Subjective:   Brandon Lopez is a 63 y.o. very pleasant male patient with Body mass index is 30.62 kg/m. who presents with the following:  He is a pleasant 63 year old gentleman who presents courtesy of Mrs. Carlis Abbott for evaluation of left shoulder pain. Patient's left shoulder films are reviewed independently by myself and he does have some mild glenohumeral osteoarthritis. 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  This is his longstanding career.  He still is having some pain after work he does still work occasionally.  He rates his pain at 10 out of 10.  It is altered his sleeping.  No trauma, fx, no surgery.   Extremely hard work and labor.    Pease OA inj GH joint  Past Medical History, Surgical History, Social History, Family History, Problem List, Medications, and Allergies have been reviewed and updated if relevant.  Patient Active Problem List   Diagnosis Date Noted  . Chronic shoulder pain 12/12/2018  . Rectal bleeding 09/19/2018  . Paroxysmal atrial fibrillation ([place]) 10/31/2017  . Syncope 08/10/2017  . Osteoarthritis 03/17/2017  . Edema 07/21/2016  . Cigarette smoker 11/17/2015  . Diverticulosis 12/16/2014  . Chronic obstructive pulmonary disease ([place]) 11/26/2014  . Cervical pain (neck) 10/14/2014  . Hyperlipidemia 07/11/2014  . Preventative health care 07/11/2014  . Pacemaker 06/03/2014  . Anxiety and depression 06/03/2014  . Alcohol abuse 06/03/2014  . OSA  (obstructive sleep apnea) 06/03/2014  . Essential hypertension 06/03/2014  . Tobacco abuse 06/03/2014    Past Medical History:  Diagnosis Date  . Arthritis   . Chronic systolic CHF (congestive heart failure) ([place])    a. echo 6/16: EF 55-60%, Gr1DD, LA nl, PASP nl; b. echo 7/19: EF 40-45%, unable to exclude RWMA, Gr1DD, LA nl, RVSF nl, PASP nl  . COPD (chronic obstructive pulmonary disease) ([place])   . Depression   . Diverticulosis    Found during colonoscopy  . Hypertension   . Kidney stones   . PAF (paroxysmal atrial fibrillation) ([place])    a. short episodes previously noted on PPM interrogation; b. started on Xarelto 07/2017 given further episodes of Afib noted during interrogation; c. CHADS2VASc at least 3 (CHF, HTN, vascualr disease with aortic calcifications noted on CT)  . 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 Presence of permanent cardiac pacemaker    a. Biotronik; Dayna Barker DR-T [id number]; placed 08/2012  . Sleep apnea   . Symptomatic bradycardia    a. s/p Biotronik; Dayna Barker DR-T [id number]; placed 08/2012 at [facility]    Past Surgical History:  Procedure Laterality Date  . APPENDECTOMY  age 29  . CARDIAC CATHETERIZATION     7 years ago  . COLONOSCOPY N/A 05/27/2014   Procedure: COLONOSCOPY;  Surgeon: Hulen Luster, MD;  Location: [facility] ENDOSCOPY;  Service: Gastroenterology;  Laterality: N/A;  . INSERT / REPLACE / REMOVE PACEMAKER     3 years ago    Social History   Socioeconomic History  . Marital status: Single  Spouse name: Not on file  . Number of children: Not on file  . Years of education: Not on file  . Highest education level: Not on file  Occupational History  . Not on file  Social Needs  . Financial resource strain: Not hard at all  . Food insecurity    Worry: Never true    Inability: Never true  . Transportation needs    Medical: No    Non-medical: No  Tobacco Use  . Smoking status: Heavy Tobacco Smoker    Packs/day: 2.00    Years: 50.00    Pack years: 100.00    Types:  Cigarettes    Start date: 1964  . Smokeless tobacco: Never Used  . Tobacco comment: started smoking at age 63, 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  Substance and Sexual Activity  . Alcohol use: Yes    Alcohol/week: 8.0 - 14.0 standard drinks    Types: 8 - 14 Cans of beer per week  . Drug use: Yes    Types: Marijuana  . Sexual activity: Not on file  Lifestyle  . Physical activity    Days per week: 0 days    Minutes per session: 0 min  . Stress: Not at all  Relationships  . Social Herbalist on phone: Not on file    Gets together: Not on file    Attends religious service: Not on file    Active member of club or organization: Not on file    Attends meetings of clubs or organizations: Not on file    Relationship status: Not on file  . Intimate partner violence    Fear of current or ex partner: No    Emotionally abused: No    Physically abused: No    Forced sexual activity: No  Other Topics Concern  . Not on file  Social History Narrative  . Not on file    Family History  Problem Relation Age of Onset  . Arthritis Mother   . Heart disease Father   . Hyperlipidemia Father   . Hypertension Father   . Cancer Paternal Grandfather     Allergies  Allergen Reactions  . Simvastatin Diarrhea  . Lisinopril Cough    Medication list reviewed and updated in full in [place].  GEN: No fevers, chills. Nontoxic. Primarily MSK c/o today. MSK: Detailed in the HPI GI: tolerating PO intake without difficulty Neuro: No numbness, parasthesias, or tingling associated. Otherwise the pertinent positives of the ROS are noted above.   Objective:   BP 130/84   Pulse 62   Temp 98.3 F (36.8 C) (Temporal)   Ht 5\' 5"  (1.651 m)   Wt 184 lb (83.5 kg)   SpO2 95%   BMI 30.62 kg/m    GEN: WDWN, NAD, Non-toxic, Alert & Oriented x 3 HEENT: Atraumatic, Normocephalic.  Ears and Nose: No external deformity. EXTR: No clubbing/cyanosis/edema NEURO: Normal gait.  PSYCH: Normally  interactive. Conversant. Not depressed or anxious appearing.  Calm demeanor.   Shoulder: L Inspection: No muscle wasting or winging Ecchymosis/edema: neg  AC joint, scapula, clavicle: NT Cervical spine: NT, full ROM Spurling's: neg Abduction: full, 5/5 Flexion: full, 5/5 IR, full, lift-off: 5/5 ER at neutral: full, 5/5 He does have some crepitus with motion. AC crossover and compression: Mildly positive. Neer: neg Hawkins: neg Drop Test: neg Empty Can: neg Supraspinatus insertion: NT Bicipital groove: NT Speed's: neg Yergason's: neg Sulcus sign: neg Scapular dyskinesis: none C5-T1 intact Sensation intact Grip  5/5   Radiology: Dg Shoulder Left  Result Date: 12/12/2018 CLINICAL DATA:  Chronic shoulder pain EXAM: LEFT SHOULDER - 2+ VIEW COMPARISON:  None. FINDINGS: Possible mild joint space narrowing of the inferior glenohumeral joint. No advanced finding. Normal humeral acromial distance. AC joint appears normal. No other regional bone finding. IMPRESSION: Possible mild osteoarthritis of the glenohumeral articulation. Otherwise normal regional structures. Electronically Signed   By: Nelson Chimes M.D.   On: 12/12/2018 15:17    Assessment and Plan:     ICD-10-CM   1. Glenohumeral arthritis, left  M19.012    His rotator cuff is intact, his strength is actually quite excellent and his range of motion is good.  OA on radiograph, I think that this is all glenohumeral arthritis with exacerbation and likely he will have symptoms with this on and on the rest of his life.  Intraarticular Shoulder Aspiration/Injection Procedure Note [facility] Saling 12-17-1955 Date of procedure: 12/18/2018  Procedure: Large Joint Aspiration / Injection of Shoulder, Intraarticular, LEFT Indications: Pain  Procedure Details Verbal consent was obtained from the patient. Risks including infection explained and contrasted with benefits and alternatives. Patient prepped with Chloraprep and Ethyl  Chloride used for anesthesia. An intraarticular shoulder injection was performed using the posterior approach; needle placed into joint capsule without difficulty. The patient tolerated the procedure well and had decreased pain post injection. No complications. Injection: 8 cc of Lidocaine 1% and 2 mL Depo-Medrol 40 mg. Needle: 21 gauge, 2 inch   Follow-up: prn  No orders of the defined types were placed in this encounter.  No orders of the defined types were placed in this encounter.   Signed,  Maud Deed. Terril Amaro, MD   Outpatient Encounter Medications as of 12/18/2018  Medication Sig  . albuterol (PROAIR HFA) 108 (90 Base) MCG/ACT inhaler Inhale 2 puffs into the lungs every 6 (six) hours as needed for wheezing or shortness of breath.  Marland Kitchen atorvastatin (LIPITOR) 20 MG tablet Take 1 tablet by mouth in the evening  . carvedilol (COREG) 3.125 MG tablet Take 1 tablet (3.125 mg total) by mouth 2 (two) times daily.  Marland Kitchen ezetimibe (ZETIA) 10 MG tablet Take 1 tablet (10 mg total) by mouth daily. *NEEDS OFFICE VISIT FOR FURTHER REFILLS-3RD AND FINAL ATTEMPT*  . loratadine (CLARITIN) 10 MG tablet Take 10 mg by mouth daily as needed for allergies.   . SYMBICORT 160-4.5 MCG/ACT inhaler Inhale 2 puffs by mouth twice daily  . telmisartan-hydrochlorothiazide (MICARDIS HCT) 40-12.5 MG tablet Take 1 tablet by mouth once daily   No facility-administered encounter medications on file as of 12/18/2018.

## 2018-12-25 NOTE — Telephone Encounter (Signed)
lmov to schedule.  Mailed letter .  Closing encounter.

## 2019-01-05 ENCOUNTER — Other Ambulatory Visit: Payer: Self-pay | Admitting: Cardiovascular Disease

## 2019-01-05 ENCOUNTER — Telehealth: Payer: Self-pay | Admitting: Cardiovascular Disease

## 2019-01-05 NOTE — Telephone Encounter (Signed)
Left detailed voicemail message that would fine to push it out if he is doing good but that it could affect refills and if that happened to just call and ask for nurse to call back with instructions to call me if he should have any further questions.

## 2019-01-05 NOTE — Telephone Encounter (Signed)
Patient due for recall with [facility] and wants to know if it is ok to push this out for a while.  He states he just has a physical with pcp and labs and also last visit with Caryl Comes was October.    Please advise.

## 2019-01-08 NOTE — Progress Notes (Signed)
Remote pacemaker transmission.   

## 2019-01-16 ENCOUNTER — Other Ambulatory Visit: Payer: Self-pay

## 2019-01-16 ENCOUNTER — Ambulatory Visit
Admission: RE | Admit: 2019-01-16 | Discharge: 2019-01-16 | Disposition: A | Discharge status: Home / Self Care | Payer: PPO | Source: Ambulatory Visit | Attending: Acute Care | Admitting: Acute Care

## 2019-01-16 DIAGNOSIS — J439 Emphysema, unspecified: Secondary | ICD-10-CM | POA: Diagnosis not present

## 2019-01-16 DIAGNOSIS — I700 Atherosclerosis of aorta: Secondary | ICD-10-CM | POA: Diagnosis not present

## 2019-01-16 DIAGNOSIS — F17210 Nicotine dependence, cigarettes, uncomplicated: Secondary | ICD-10-CM | POA: Diagnosis not present

## 2019-01-16 DIAGNOSIS — F17200 Nicotine dependence, unspecified, uncomplicated: Secondary | ICD-10-CM | POA: Diagnosis present

## 2019-01-16 DIAGNOSIS — Z122 Encounter for screening for malignant neoplasm of respiratory organs: Secondary | ICD-10-CM | POA: Diagnosis not present

## 2019-01-29 ENCOUNTER — Other Ambulatory Visit: Payer: Self-pay | Admitting: *Deleted

## 2019-01-29 DIAGNOSIS — Z87891 Personal history of nicotine dependence: Secondary | ICD-10-CM

## 2019-01-29 DIAGNOSIS — F17210 Nicotine dependence, cigarettes, uncomplicated: Secondary | ICD-10-CM

## 2019-02-05 ENCOUNTER — Other Ambulatory Visit: Payer: Self-pay | Admitting: Primary Care

## 2019-02-05 DIAGNOSIS — J449 Chronic obstructive pulmonary disease, unspecified: Secondary | ICD-10-CM

## 2019-02-20 ENCOUNTER — Telehealth: Payer: Self-pay | Admitting: Internal Medicine

## 2019-02-20 NOTE — Progress Notes (Signed)
Cardiology Office Note  Date:  02/21/2019   ID:  Brandon Lopez, DOB July 16, 1955, MRN [id number]  PCP:  Pleas Koch, NP   Chief Complaint  Patient presents with  . OTHER    12 month f/u wants to discuss lung CT cancer screening due to spotted calcium build up. Meds reviewed verbally with pt.    HPI:  Brandon Lopez is a 64 y.o. male past medical history of Cardiac cath >10 yr ago, 20% somewhere COPD, smoker 2 ppd Hyperlipidemia pacemaker implanted at [facility] for dyspnea and presyncope.   Afib, but duration < 12 hrs, started on anticoagulation at last hospitalization  Pacer, followed by Dr. Caryl Comes Who presents for f/u of his chest pain, atrial fibrillation, shortness of breath  Still smoking weight climbing Poor diet No regular exercise program Denies any chest pain or shortness of breath on exertion No recent COPD exacerbation  Denies any tachycardia concerning for atrial fibrillation Not on anticoagulation  Pacemaker followed by Dr. Therisa Doyne downloads reviewed, minimal atrial fibrillation  EKG personally reviewed by myself on todays visit Shows paced rhythm rate no significant ST-T wave changes  Echocardiogram July 2019 - Left ventricle: The cavity size was normal. Systolic function was   mildly to moderately reduced. The estimated ejection fraction was   in the range of 40% to 45%. Regional wall motion abnormalities   cannot be excluded. Doppler parameters are consistent with   abnormal left ventricular relaxation (grade 1 diastolic   dysfunction). - Left atrium: The atrium was normal in size. - Right ventricle: Pacer wire or catheter noted in right ventricle.   Systolic function was normal. - Pulmonary arteries: Systolic pressure was within the normal   range.  Stress test August 2019 reviewed with him Pharmacological myocardial perfusion imaging study with no significant  Ischemia Low risk scan  CT scan abdomen  moderate aortic atherosclerosis,  iliac artery disease, SFA disease  Other past medical history reviewed Hospitalized 7/19 for an unusual episode where he had some abdominal discomfort tingling in his chest and not feeling right.  EMS was called.  Notes not available.  There was a comment about unresponsive and cyanotic.  The day before he had had atrial fibrillation for about 6 hours.    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  PSH:    Past Surgical History:  Procedure Laterality Date  . APPENDECTOMY  age 74  . CARDIAC CATHETERIZATION     7 years ago  . COLONOSCOPY N/A 05/27/2014   Procedure: COLONOSCOPY;  Surgeon: Hulen Luster, MD;  Location: [facility] ENDOSCOPY;  Service: Gastroenterology;  Laterality: N/A;  . INSERT / REPLACE / REMOVE PACEMAKER     3 years ago    Current Outpatient Medications  Medication Sig Dispense Refill  . albuterol (PROAIR HFA) 108 (90 Base) MCG/ACT inhaler Inhale 2 puffs into the lungs every 6 (six) hours as needed for wheezing or shortness of breath. 18 g 2  . atorvastatin (LIPITOR) 20 MG tablet Take 1 tablet by mouth in the evening 90 tablet 2  . carvedilol (COREG) 3.125 MG tablet Take 1 tablet (3.125 mg total) by mouth 2 (two) times daily. 180 tablet 3  . ezetimibe (ZETIA) 10 MG tablet TAKE 1 TABLET BY MOUTH ONCE DAILY (  NEEDS  OFFICE  VISIT  FOR  FURTHER  REFILLS) 30 tablet 3  . loratadine (CLARITIN) 10 MG tablet Take 10 mg by mouth daily as needed for allergies.     . SYMBICORT 160-4.5 MCG/ACT inhaler Inhale 2 puffs by mouth twice daily 33 g 1  . telmisartan-hydrochlorothiazide (MICARDIS HCT) 40-12.5 MG tablet Take 1 tablet by mouth once daily 90 tablet 3   No current  facility-administered medications for this visit.     Allergies:   Simvastatin and Lisinopril   Social History:  The patient  reports that he has been smoking cigarettes. He started smoking about 57 years ago. He has a 100.00 pack-year smoking history. He has never used smokeless tobacco. He reports current alcohol use of about 8.0 - 14.0 standard drinks of alcohol per week. He reports current drug use. Drug: Marijuana.   Family History:   family history includes Arthritis in his mother; Cancer in his paternal grandfather; Heart disease in his father; Hyperlipidemia in his father; Hypertension in his father.    Review of Systems: Review of Systems  Constitutional: Negative.   Respiratory: Positive for cough and shortness of breath.   Cardiovascular: Negative.   Gastrointestinal: Negative.   Musculoskeletal: Negative.   Neurological: Negative.   Psychiatric/Behavioral: Negative.   All other systems reviewed and are negative.   PHYSICAL EXAM: VS:  BP 120/90 (BP Location: Left Arm, Patient Position: Sitting, Cuff Size: Normal)   Pulse 77   Ht 5' 5.5" (1.664 m)   Wt 188 lb 4 oz (85.4 kg)   SpO2 97%   BMI 30.85 kg/m  , BMI Body mass index is 30.85 kg/m. Constitutional:  oriented to person, place, and time. No distress.  HENT:  Head: Grossly normal Eyes:  no discharge. No scleral icterus.  Neck: No JVD, no carotid bruits  Cardiovascular: Regular rate and rhythm, no murmurs appreciated Pulmonary/Chest: Clear to auscultation bilaterally, no wheezes or rails Abdominal: Soft.  no distension.  no tenderness.  Musculoskeletal: Normal range of motion Neurological:  normal muscle tone. Coordination normal. No atrophy Skin: Skin warm and dry Psychiatric: normal affect, pleasant   Recent Labs: 12/12/2018: ALT 26; BUN 11; Creatinine, Ser 0.75; Hemoglobin 16.0; Platelets 217.0; Potassium 4.3; Sodium 137; TSH 1.93    Lipid Panel Lab Results  Component Value Date   CHOL 124  12/12/2018   HDL 37.90 (L) 12/12/2018   LDLCALC 71 12/12/2018   TRIG 73.0 12/12/2018    Wt Readings from Last 3 Encounters:  02/21/19 188 lb 4 oz (85.4 kg)  01/16/19 184 lb (83.5 kg)  12/18/18 184 lb (83.5 kg)      ASSESSMENT AND PLAN:  Chronic obstructive pulmonary disease, unspecified COPD type ([place]) Smoking cessation discussion today concerning smoking cessation techniques He does not want Chantix He may try other modalities  Paroxysmal atrial fibrillation ([place])  Followed by Dr. Caryl Comes, denies any tachycardia or palpitations off anticoagulation Pacemaker downloads reviewed showing no significant atrial fibrillation  CAD, Coronary calcium noted on prior scans On lipitor and zetia, LDL 70 Unable to tolerate higher dose Lipitor  Essential hypertension Diastolic elevated, Walked into ofice long distance Will monitor at home for now, no changes made  Pacemaker Followed by Caryl Comes  Edema, unspecified type Minimal, no further work-up, recommended compression hose leg elevation if gets worse  Cigarette smoker Long discussion, Does not want chantix, >5 min spend discussing  Shortness of breath Stable, mild Still smoking No regular exercise Myoview: 08/2017: low risk  Disposition:  F/U  12 months   Total encounter time more than 25 minutes  Greater than 50% was spent in counseling and coordination of care with the patient   Orders Placed This Encounter  Procedures  . EKG 12-Lead     Signed, Esmond Plants, M.D., Ph.D. 02/21/2019  [place], [place]

## 2019-02-20 NOTE — Telephone Encounter (Signed)
Magdalen Spatz, NP  Brand Males, MD; Cori Henningsen, Waldemar Dickens, CMA  Dr. Chase Caller and Raquel Sarna,  This patient was found to have new fibrotic changes on his LDCT this year. Can you please see him as consult for new findings of fibrosis on CT? Thanks          Attempted to call pt to get him scheduled for consult with MR but unable to reach. Left message for pt to return call.

## 2019-02-21 ENCOUNTER — Encounter: Payer: Self-pay | Admitting: Cardiovascular Disease

## 2019-02-21 ENCOUNTER — Other Ambulatory Visit: Payer: Self-pay

## 2019-02-21 ENCOUNTER — Ambulatory Visit (INDEPENDENT_AMBULATORY_CARE_PROVIDER_SITE_OTHER): Payer: PPO | Admitting: Cardiovascular Disease

## 2019-02-21 VITALS — BP 120/90 | HR 77 | Ht 65.5 in | Wt 188.2 lb

## 2019-02-21 DIAGNOSIS — I480 Paroxysmal atrial fibrillation: Secondary | ICD-10-CM

## 2019-02-21 DIAGNOSIS — R55 Syncope and collapse: Secondary | ICD-10-CM

## 2019-02-21 DIAGNOSIS — Z950 Presence of cardiac pacemaker: Secondary | ICD-10-CM

## 2019-02-21 DIAGNOSIS — I495 Sick sinus syndrome: Secondary | ICD-10-CM | POA: Diagnosis not present

## 2019-02-21 DIAGNOSIS — I5022 Chronic systolic (congestive) heart failure: Secondary | ICD-10-CM | POA: Diagnosis not present

## 2019-02-21 DIAGNOSIS — J449 Chronic obstructive pulmonary disease, unspecified: Secondary | ICD-10-CM | POA: Diagnosis not present

## 2019-02-21 DIAGNOSIS — I700 Atherosclerosis of aorta: Secondary | ICD-10-CM

## 2019-02-21 NOTE — Patient Instructions (Signed)
Work on the smoking   Medication Instructions:  No changes  If you need a refill on your cardiac medications before your next appointment, please call your pharmacy.    Lab work: No new labs needed   If you have labs (blood work) drawn today and your tests are completely normal, you will receive your results only by: Marland Kitchen MyChart Message (if you have MyChart) OR . A paper copy in the mail If you have any lab test that is abnormal or we need to change your treatment, we will call you to review the results.   Testing/Procedures: No new testing needed   Follow-Up: At [facility], you and your health needs are our priority.  As part of our continuing mission to provide you with exceptional heart care, we have created designated Provider Care Teams.  These Care Teams include your primary Cardiologist (physician) and Advanced Practice Providers (APPs -  Physician Assistants and Nurse Practitioners) who all work together to provide you with the care you need, when you need it.  . You will need a follow up appointment in 12 months   . Providers on your designated Care Team:   . Murray Hodgkins, NP . Christell Faith, PA-C . Marrianne Mood, PA-C  Any Other Special Instructions Will Be Listed Below (If Applicable).  For educational health videos Log in to : [email or web address] Or : [email or web address], password : [email or web address]

## 2019-03-02 NOTE — Telephone Encounter (Signed)
Attempted to call pt x2 but unable to reach. Left message for pt to return call. 

## 2019-03-08 NOTE — Telephone Encounter (Signed)
Attempted to call pt but unable to reach x3. Left message for pt to return call. Due to multiple attempts trying to reach pt with unable to do so, encounter will be closed and a letter will also be mailed to address we have on file to have pt contact office so we can get him scheduled for a consult appt with MR per SG.

## 2019-03-13 ENCOUNTER — Ambulatory Visit (INDEPENDENT_AMBULATORY_CARE_PROVIDER_SITE_OTHER): Payer: PPO | Admitting: *Deleted

## 2019-03-13 DIAGNOSIS — I480 Paroxysmal atrial fibrillation: Secondary | ICD-10-CM

## 2019-03-13 LAB — CUP PACEART REMOTE DEVICE CHECK
Date Time Interrogation Session: 20210216062309
Implantable Lead Implant Date: 20140801
Implantable Lead Implant Date: 20140801
Implantable Lead Location: 753859
Implantable Lead Location: 753860
Implantable Lead Model: 362
Implantable Lead Serial Number: 29356730
Implantable Lead Serial Number: 29392661
Implantable Pulse Generator Implant Date: 20140801
Pulse Gen Serial Number: 68053398

## 2019-03-14 NOTE — Progress Notes (Signed)
PPM Remote  

## 2019-03-23 ENCOUNTER — Telehealth: Payer: Self-pay | Admitting: Internal Medicine

## 2019-03-23 NOTE — Telephone Encounter (Signed)
Attempted to call pt but unable to reach. Left message for pt to return call.  When pt returns call, please schedule pt a consult appt with MR in ILD clinic.

## 2019-03-23 NOTE — Telephone Encounter (Signed)
-----   Message from Magdalen Spatz, NP sent at 01/25/2019 11:27 AM EST ----- Please note this is a lung cancer screening patient with notation of Mild subpleural reticulation/fibrosis in the lungs bilaterally, lower lobe predominant. Looks like he was followed by [facility] in [place] for CPAP therapy. No recent follow up with them. No real notation of fibrosis. Not seen on last years screening scan. We need him to establish with ILD  clinic here in [place].   Belenda Cruise , are you ok if we refer him to the ILD clinic here for management of this finding and the finding of bronchiectasis, which is also new ? Let me know. Thanks so much

## 2019-03-23 NOTE — Telephone Encounter (Signed)
He has ILD on his LDCT. Brandon Lopez referred patient to me. Pls arrange followup- 30 min slot Needs to do ILD questionnaire first. Ideally also HRCT but if he wants to hold off on that is fine . Can decide serology and PFT after visit

## 2019-03-26 NOTE — Telephone Encounter (Signed)
Attempted to call pt but unable to reach. Left message for pt to return call. 

## 2019-03-27 NOTE — Telephone Encounter (Signed)
Pt has been scheduled for a consult with MR 4/13. I have placed ILD questionnaire packet in mail for pt. Nothing further needed.

## 2019-05-06 ENCOUNTER — Other Ambulatory Visit: Payer: Self-pay | Admitting: Cardiovascular Disease

## 2019-05-08 ENCOUNTER — Other Ambulatory Visit: Payer: Self-pay

## 2019-05-08 ENCOUNTER — Encounter: Payer: Self-pay | Admitting: Internal Medicine

## 2019-05-08 ENCOUNTER — Ambulatory Visit: Payer: PPO | Admitting: Internal Medicine

## 2019-05-08 VITALS — BP 132/76 | HR 70 | Temp 97.6°F | Ht 66.0 in | Wt 186.8 lb

## 2019-05-08 DIAGNOSIS — Z77090 Contact with and (suspected) exposure to asbestos: Secondary | ICD-10-CM

## 2019-05-08 DIAGNOSIS — R683 Clubbing of fingers: Secondary | ICD-10-CM | POA: Diagnosis not present

## 2019-05-08 DIAGNOSIS — R053 Chronic cough: Secondary | ICD-10-CM

## 2019-05-08 DIAGNOSIS — R0609 Other forms of dyspnea: Secondary | ICD-10-CM

## 2019-05-08 DIAGNOSIS — R05 Cough: Secondary | ICD-10-CM | POA: Diagnosis not present

## 2019-05-08 DIAGNOSIS — R0600 Dyspnea, unspecified: Secondary | ICD-10-CM | POA: Diagnosis not present

## 2019-05-08 DIAGNOSIS — J8410 Pulmonary fibrosis, unspecified: Secondary | ICD-10-CM | POA: Diagnosis not present

## 2019-05-08 DIAGNOSIS — J439 Emphysema, unspecified: Secondary | ICD-10-CM

## 2019-05-08 LAB — SEDIMENTATION RATE: Sed Rate: 13 mm/hr (ref 0–20)

## 2019-05-08 NOTE — Patient Instructions (Addendum)
ICD-10-CM   1. Dyspnea on exertion  R06.00   2. Chronic cough  R05   3. Clubbing of fingers  R68.3   4. History of exposure to asbestos  Z77.090   5. 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

## 2019-05-08 NOTE — Progress Notes (Signed)
OV 05/08/2019  Subjective:  Patient ID: Brandon Lopez, male , DOB: [birth year] , age 64 y.o. , MRN: [id number] , ADDRESS: [address] [place] [address]   05/08/2019 -   Chief Complaint  Patient presents with  . Consult     HPI Brandon Lopez 64 y.o. -  [place] Integrated Comprehensive ILD Questionnaire  Symptoms:  -Referred by Brandon Lopez nurse practitioner for the lung cancer screening program.  Patient had lung cancer low-dose CT scan of the chest in December 2020 that showed emphysema associated with ILD changes.  Therefore has been referred here.  There is no high-resolution CT scan of the chest.  Patient reports insidious onset of shortness of breath for the last 10 months to a year.  Since it started it is the same.  Dyspnea is currently mild and particularly with heel and walking upstairs.  There is an associated cough that started 15 years ago since it started it is the same.  It is mild and severity there is associated white sputum.  He does clear his throat and does feel a tickle in the back of the throat but there is no hemoptysis.  Cough is not worse lying down.  Cough does not affect voice.  SYMPTOM SCALE - ILD 05/08/2019   O2 use ra  Shortness of Breath 0 -> 5 scale with 5 being worst (score 6 If unable to do)  At rest 0  Simple tasks - showers, clothes change, eating, shaving 0  Household (dishes, doing bed, laundry) 0  Shopping 0  Walking level at own pace 0  Walking up Stairs 1  Total (30-36) Dyspnea Score 1  How bad is your cough? yes  How bad is your fatigue x  How bad is nausea x  How bad is vomiting?  x  How bad is diarrhea? x  How bad is anxiety? x  How bad is depression x       Past Medical History :  -He reports positive for cardiomyopathy with ejection fraction 45% as I personally reviewed on the echo.  Positive for COPD.  Positive for arthritis.  He marked rheumatoid arthritis but he says he is never seen a  rheumatologist.  He states it could easily be osteoarthritis.  Positive for sleep apnea and hypertension.  He does have kidney stones and a history of pneumonia.  There is no confirmed diagnosis of collagen vascular disease or vasculitis.  He does have hypertension.  No diabetes no thyroid disease.   ROS:  -Positive for fatigue for the last few decades.  Positive for arthralgia.  He thinks he has lost 60 pounds of weight.  He has nausea occasionally.  There is occasional vomiting occasional acid reflux.  And occasional snoring despite CPAP use.  No rash or ulcers.  No dysphagia.  No Raynaud's   FAMILY HISTORY of LUNG DISEASE:  -Marked positive for hypersensitivity pneumonitis [need to inquire further].  Denies any pulmonary fibrosis or COPD in the family.  No autoimmune disease.   EXPOSURE HISTORY: * -Smoke cigarettes 1968 and onwards.  On average smokes 40 cigarettes/day.  He still smokes.  Does not live with any passive smoking.  No vaping or electronic cigarette use of marijuana use currently.  In the past used marijuana in 1972 for 1 joint a day.  In the past and 19 therapy also used cocaine very little.  No intravenous drug use.   HOME and HOBBY DETAILS : [place] 6 [place] age of the home is 60 years.  He has a CPAP but there is no moisture content or mildew in it.  No feather pillows or duvet.  No AC duct.  No gardening no wind instruments.  No mold or mildew in the shower curtains or anywhere in the bathroom.  No other organic antigen exposure.   OCCUPATIONAL HISTORY (122 questions) : Positive for asbestos exposure in the 1970s for 7 years.  He install asbestos.  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  He says the asbestos exposure was significant.  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  He has done some woodwork Brewing technologist work.  He is  also done metal grinding, welding and machine operations.  He has done Financial trader, pipe work up, Development worker, community, Armed forces technical officer, wood trim and furniture work.  He is done flooring work is done Psychologist, forensic work.  He is on Teacher, English as a foreign language work.  Of all these the asbestos exposure was the most significant.  Because he installed and removed asbestos.  Otherwise negative   PULMONARY TOXICITY HISTORY (27 items): Essentially negative.    Simple office walk 185 feet x  3 laps goal with forehead probe 05/08/2019   O2 used ra  Number laps completed 3  Comments about pace norma  Resting Pulse Ox/HR 98% and 70/min  Final Pulse Ox/HR 97% and 96/min  Desaturated </= 88% no  Desaturated <= 3% points no  Got Tachycardic >/= 90/min yes  Symptoms at end of test none  Miscellaneous comments x      ROS - per HPI     has a past medical history of Arthritis, Chronic systolic CHF (congestive heart failure) ([place]), COPD (chronic obstructive pulmonary disease) ([place]), Depression, Diverticulosis, Hypertension, Kidney stones, PAF (paroxysmal atrial fibrillation) ([place]), 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   reports that he has been smoking cigarettes. He started smoking about 57 years ago. He has a 100.00 pack-year smoking history. He has never used smokeless tobacco.  Past Surgical History:  Procedure Laterality Date  . APPENDECTOMY  age 78  . CARDIAC CATHETERIZATION     7 years ago  . COLONOSCOPY N/A 05/27/2014   Procedure: COLONOSCOPY;  Surgeon: Hulen Luster, MD;  Location: [facility] ENDOSCOPY;  Service: Gastroenterology;  Laterality: N/A;  . INSERT / REPLACE / REMOVE PACEMAKER     3 years ago    Allergies  Allergen Reactions  . Simvastatin Diarrhea  . Lisinopril Cough    Immunization History  Administered Date(s) Administered  . Influenza,inj,Quad PF,6+ Mos 11/15/2014, 11/05/2015, 12/23/2016, 12/06/2017,  12/12/2018  . Pneumococcal Polysaccharide-23 11/04/2014  . Td 10/26/2011  . Zoster 11/18/2015    Family History  Problem Relation Age of Onset  . Arthritis Mother   . Heart disease Father   . Hyperlipidemia Father   . Hypertension Father   . Cancer Paternal Grandfather      Current Outpatient Medications:  .  albuterol (PROAIR HFA) 108 (90 Base) MCG/ACT inhaler, Inhale 2 puffs into the lungs every 6 (six) hours as needed for wheezing or shortness of breath., Disp: 18 g, Rfl: 2 .  atorvastatin (LIPITOR) 20 MG tablet, Take 1 tablet by mouth in the evening, Disp: 90 tablet, Rfl: 2 .  carvedilol (COREG) 3.125 MG tablet, Take 1 tablet (3.125 mg total) by mouth 2 (two) times  daily., Disp: 180 tablet, Rfl: 3 .  ezetimibe (ZETIA) 10 MG tablet, Take 1 tablet (10 mg total) by mouth daily., Disp: 30 tablet, Rfl: 8 .  loratadine (CLARITIN) 10 MG tablet, Take 10 mg by mouth daily as needed for allergies. , Disp: , Rfl:  .  SYMBICORT 160-4.5 MCG/ACT inhaler, Inhale 2 puffs by mouth twice daily, Disp: 33 g, Rfl: 1 .  telmisartan-hydrochlorothiazide (MICARDIS HCT) 40-12.5 MG tablet, Take 1 tablet by mouth once daily, Disp: 90 tablet, Rfl: 3      Objective:   Vitals:   05/08/19 1005  BP: 132/76  Pulse: 70  Temp: 97.6 F (36.4 C)  TempSrc: Temporal  SpO2: 98%  Weight: 186 lb 12.8 oz (84.7 kg)  Height: _0  (1.676 m)    Estimated body mass index is 30.15 kg/m as calculated from the following:   Height as of this encounter: _1  (1.676 m).   Weight as of this encounter: 186 lb 12.8 oz (84.7 kg).  _2 @  [employer]   05/08/19 1005  Weight: 186 lb 12.8 oz (84.7 kg)     Physical Exam  General Appearance:    Alert, cooperative, no distress, appears stated age - yes , Deconditioned looking - no , OBESE  - no, Sitting on Wheelchair -  no  Head:    Normocephalic, without obvious abnormality, atraumatic  Eyes:    PERRL, conjunctiva/corneas clear,  Ears:    Normal TM's and  external ear canals, both ears  Nose:   Nares normal, septum midline, mucosa normal, no drainage    or sinus tenderness. OXYGEN ON  - no . Patient is @ ra   Throat:   Lips, mucosa, and tongue normal; teeth and gums normal. Cyanosis on lips - no  Neck:   Supple, symmetrical, trachea midline, no adenopathy;    thyroid:  no enlargement/tenderness/nodules; no carotid   bruit or JVD  Back:     Symmetric, no curvature, ROM normal, no CVA tenderness  Lungs:     Distress - no , Wheeze no, Barrell Chest - no, Purse lip breathing - no, Crackles - no   Chest Wall:    No tenderness or deformity.    Heart:    Regular rate and rhythm, S1 and S2 normal, no rub   or gallop, Murmur - no  Breast Exam:    NOT DONE  Abdomen:     Soft, non-tender, bowel sounds active all four quadrants,    no masses, no organomegaly. Visceral obesity - mild yes  Genitalia:   NOT DONE  Rectal:   NOT DONE  Extremities:   Extremities - normal, Has Cane - no, Clubbing - yes, Edema - no  Pulses:   2+ and symmetric all extremities  Skin:   Stigmata of Connective Tissue Disease - no  Lymph nodes:   Cervical, supraclavicular, and axillary nodes normal  Psychiatric:  Neurologic:   Pleasant - yes, Anxious - no, Flat affect - no  CAm-ICU - neg, Alert and Oriented x 3 - yes, Moves all 4s - yes, Speech - normal, Cognition - intact           Assessment:       ICD-10-CM   1. Dyspnea on exertion  R06.00   2. Chronic cough  R05   3. Clubbing of fingers  R68.3   4. History of exposure to asbestos  Z77.090   5. Pulmonary emphysema with fibrosis of lung ([place])  J43.9    J84.10  He definitely has emphysema.  He probably has concomitant ILD.  The ILD itself could be IPF or asbestosis.  There is no evidence of hypersensitive pneumonitis based on history.  We need to better define the severely in the presence and etiology.  Therefore we will do the following investigations including high-resolution CT scan of the chest pulmonary  function test and serology profile.  We will reevaluate him after that.    Plan:     Patient Instructions     ICD-10-CM   1. Dyspnea on exertion  R06.00   2. Chronic cough  R05   3. Clubbing of fingers  R68.3   4. History of exposure to asbestos  Z77.090   5. Pulmonary emphysema with fibrosis of lung ([place])  J43.9    J84.10     Do HRCT supine, prone, inspiratory and expiratory images  Do FUll PFT  Do blood work - Serum: ESR, ANA, DS-DNA, RF, anti-CCP,  scl-70, ssA, ssB, Hypersensitivity Pneumonitis Panel   Followup - Lopez/june 2021 with Dr Chase Caller - 30 min slot in [facility] or [place] clinic but after completing all of above    SIGNATURE    Dr. Brand Males, M.D., F.C.C.P,  Pulmonary and Critical Care Medicine Staff Physician, [facility] Director - Interstitial Lung Disease  Program  Pulmonary [place] at [place], [place], [address]  Pager: [phone], If no answer or between  15:00h - 7:00h: call [phone] Telephone: [phone]  10:39 AM 05/08/2019

## 2019-05-11 ENCOUNTER — Telehealth: Payer: Self-pay | Admitting: Primary Care

## 2019-05-11 NOTE — Chronic Care Management (AMB) (Signed)
  Chronic Care Management   Note  05/11/2019 Name: Brandon Lopez MRN: [id number] DOB: July 24, 1955  Brandon Lopez is a 64 y.o. year old male who is a primary care patient of Pleas Koch, NP. I reached out to [name] by phone today in response to a referral sent by Brandon Lopez's PCP, Pleas Koch, NP.   Brandon Lopez was given information about Chronic Care Management services today including:  1. CCM service includes personalized support from designated clinical staff supervised by his physician, including individualized plan of care and coordination with other care providers 2. 24/7 contact phone numbers for assistance for urgent and routine care needs. 3. Service will only be billed when office clinical staff spend 20 minutes or more in a month to coordinate care. 4. Only one practitioner may furnish and bill the service in a calendar month. 5. The patient may stop CCM services at any time (effective at the end of the month) by phone call to the office staff.   Patient agreed to services and verbal consent obtained.   Follow up plan:   [name] [employer] Scheduler

## 2019-05-11 NOTE — Progress Notes (Signed)
  Chronic Care Management   Outreach Note  05/11/2019 Name: Brandon Lopez MRN: [id number] DOB: Mar 10, 1955  Referred by: Pleas Koch, NP Reason for referral : No chief complaint on file.   An unsuccessful telephone outreach was attempted today. The patient was referred to the pharmacist for assistance with care management and care coordination.   Follow Up Plan:   [name] [employer] Scheduler

## 2019-05-14 LAB — ANTI-SCLERODERMA ANTIBODY: Scleroderma (Scl-70) (ENA) Antibody, IgG: <1 AI

## 2019-05-14 LAB — ANTI-DNA ANTIBODY, DOUBLE-STRANDED: ds DNA Ab: <1 IU/mL

## 2019-05-14 LAB — HYPERSENSITIVITY PNUEMONITIS PROFILE
ASPERGILLUS FUMIGATUS: NEGATIVE
Faenia retivirgula: NEGATIVE
Pigeon Serum: NEGATIVE
S. VIRIDIS: NEGATIVE
T. CANDIDUS: NEGATIVE
T. VULGARIS: NEGATIVE

## 2019-05-14 LAB — SJOGREN'S SYNDROME ANTIBODS(SSA + SSB)
SSA (Ro) (ENA) Antibody, IgG: <1 AI
SSB (La) (ENA) Antibody, IgG: <1 AI

## 2019-05-14 LAB — RHEUMATOID FACTOR: Rheumatoid fact SerPl-aCnc: 16 IU/mL — ABNORMAL HIGH (ref <14)

## 2019-05-14 LAB — CYCLIC CITRUL PEPTIDE ANTIBODY, IGG: Cyclic Citrullin Peptide Ab: <16 UNITS

## 2019-05-14 LAB — ANA: Anti Nuclear Antibody (ANA): NEGATIVE

## 2019-05-18 ENCOUNTER — Other Ambulatory Visit: Payer: Self-pay

## 2019-05-18 ENCOUNTER — Ambulatory Visit
Admission: RE | Admit: 2019-05-18 | Discharge: 2019-05-18 | Disposition: A | Discharge status: Home / Self Care | Payer: PPO | Source: Ambulatory Visit | Attending: Internal Medicine | Admitting: Internal Medicine

## 2019-05-18 DIAGNOSIS — R683 Clubbing of fingers: Secondary | ICD-10-CM | POA: Insufficient documentation

## 2019-05-18 DIAGNOSIS — R053 Chronic cough: Secondary | ICD-10-CM

## 2019-05-18 DIAGNOSIS — Z77090 Contact with and (suspected) exposure to asbestos: Secondary | ICD-10-CM | POA: Insufficient documentation

## 2019-05-18 DIAGNOSIS — R0609 Other forms of dyspnea: Secondary | ICD-10-CM

## 2019-05-18 DIAGNOSIS — J432 Centrilobular emphysema: Secondary | ICD-10-CM | POA: Diagnosis not present

## 2019-05-18 DIAGNOSIS — J8410 Pulmonary fibrosis, unspecified: Secondary | ICD-10-CM | POA: Diagnosis not present

## 2019-05-18 DIAGNOSIS — R05 Cough: Secondary | ICD-10-CM | POA: Diagnosis not present

## 2019-05-18 DIAGNOSIS — R0602 Shortness of breath: Secondary | ICD-10-CM | POA: Diagnosis not present

## 2019-05-18 DIAGNOSIS — R0600 Dyspnea, unspecified: Secondary | ICD-10-CM | POA: Insufficient documentation

## 2019-05-18 DIAGNOSIS — J439 Emphysema, unspecified: Secondary | ICD-10-CM | POA: Diagnosis not present

## 2019-05-25 NOTE — Progress Notes (Signed)
Emphysema on ct. No fibrosis or cancer of lung. Wil discuss at next OV end of may 2021

## 2019-05-28 ENCOUNTER — Telehealth: Payer: Self-pay | Admitting: Internal Medicine

## 2019-05-28 NOTE — Telephone Encounter (Signed)
Advised pt of results. Pt understood and nothing further is needed.    Brand Males, MD  05/25/2019 5:05 PM EDT    Emphysema on ct. No fibrosis or cancer of lung. Wil discuss at next OV end of may 20

## 2019-05-29 ENCOUNTER — Telehealth: Payer: Self-pay | Admitting: Primary Care

## 2019-05-29 NOTE — Progress Notes (Signed)
  Chronic Care Management   Outreach Note  05/29/2019 Name: Brandon Lopez MRN: [id number] DOB: 1955-11-07  Referred by: Pleas Koch, NP Reason for referral : No chief complaint on file.   An unsuccessful telephone outreach was attempted today. The patient was referred to the pharmacist for assistance with care management and care coordination.    This note is not being shared with the patient for the following reason: To respect privacy (The patient or proxy has requested that the information not be shared).  Follow Up Plan:   [name] [employer] Scheduler

## 2019-06-06 ENCOUNTER — Telehealth: Payer: PPO

## 2019-06-12 ENCOUNTER — Ambulatory Visit (INDEPENDENT_AMBULATORY_CARE_PROVIDER_SITE_OTHER): Payer: PPO | Admitting: *Deleted

## 2019-06-12 DIAGNOSIS — I480 Paroxysmal atrial fibrillation: Secondary | ICD-10-CM

## 2019-06-12 LAB — CUP PACEART REMOTE DEVICE CHECK
Date Time Interrogation Session: 20210518132122
Implantable Lead Implant Date: 20140801
Implantable Lead Implant Date: 20140801
Implantable Lead Location: 753859
Implantable Lead Location: 753860
Implantable Lead Model: 362
Implantable Lead Serial Number: 29356730
Implantable Lead Serial Number: 29392661
Implantable Pulse Generator Implant Date: 20140801
Pulse Gen Serial Number: 68053398

## 2019-06-13 NOTE — Progress Notes (Signed)
Remote pacemaker transmission.   

## 2019-06-18 ENCOUNTER — Other Ambulatory Visit: Payer: Self-pay

## 2019-06-18 ENCOUNTER — Other Ambulatory Visit
Admission: RE | Admit: 2019-06-18 | Discharge: 2019-06-18 | Disposition: A | Discharge status: Home / Self Care | Payer: PPO | Source: Ambulatory Visit | Attending: Internal Medicine | Admitting: Internal Medicine

## 2019-06-18 DIAGNOSIS — Z01812 Encounter for preprocedural laboratory examination: Secondary | ICD-10-CM | POA: Diagnosis not present

## 2019-06-18 DIAGNOSIS — Z20822 Contact with and (suspected) exposure to covid-19: Secondary | ICD-10-CM | POA: Insufficient documentation

## 2019-06-18 LAB — SARS CORONAVIRUS 2 (TAT 6-24 HRS): SARS Coronavirus 2: NEGATIVE

## 2019-06-19 ENCOUNTER — Ambulatory Visit: Payer: PPO | Admitting: Internal Medicine

## 2019-06-19 ENCOUNTER — Ambulatory Visit (INDEPENDENT_AMBULATORY_CARE_PROVIDER_SITE_OTHER): Payer: PPO | Admitting: Internal Medicine

## 2019-06-19 ENCOUNTER — Encounter: Payer: Self-pay | Admitting: Internal Medicine

## 2019-06-19 VITALS — BP 128/72 | HR 70 | Temp 97.9°F | Ht 66.0 in | Wt 187.8 lb

## 2019-06-19 DIAGNOSIS — R053 Chronic cough: Secondary | ICD-10-CM

## 2019-06-19 DIAGNOSIS — F17210 Nicotine dependence, cigarettes, uncomplicated: Secondary | ICD-10-CM

## 2019-06-19 DIAGNOSIS — Z77090 Contact with and (suspected) exposure to asbestos: Secondary | ICD-10-CM | POA: Diagnosis not present

## 2019-06-19 DIAGNOSIS — R683 Clubbing of fingers: Secondary | ICD-10-CM

## 2019-06-19 DIAGNOSIS — Z7185 Encounter for immunization safety counseling: Secondary | ICD-10-CM

## 2019-06-19 DIAGNOSIS — J439 Emphysema, unspecified: Secondary | ICD-10-CM

## 2019-06-19 DIAGNOSIS — Z7189 Other specified counseling: Secondary | ICD-10-CM

## 2019-06-19 DIAGNOSIS — R0609 Other forms of dyspnea: Secondary | ICD-10-CM

## 2019-06-19 DIAGNOSIS — R0600 Dyspnea, unspecified: Secondary | ICD-10-CM | POA: Diagnosis not present

## 2019-06-19 LAB — PULMONARY FUNCTION TEST
DL/VA % pred: 81 %
DL/VA: 3.46 ml/min/mmHg/L
DLCO cor % pred: 94 %
DLCO cor: 22.64 ml/min/mmHg
DLCO unc % pred: 94 %
DLCO unc: 22.64 ml/min/mmHg
FEF 25-75 Post: 1.94 L/sec
FEF 25-75 Pre: 1.6 L/sec
FEF2575-%Change-Post: 21 %
FEF2575-%Pred-Post: 78 %
FEF2575-%Pred-Pre: 64 %
FEV1-%Change-Post: 6 %
FEV1-%Pred-Post: 91 %
FEV1-%Pred-Pre: 85 %
FEV1-Post: 2.75 L
FEV1-Pre: 2.58 L
FEV1FVC-%Change-Post: 0 %
FEV1FVC-%Pred-Pre: 90 %
FEV6-%Change-Post: 5 %
FEV6-%Pred-Post: 104 %
FEV6-%Pred-Pre: 98 %
FEV6-Post: 3.98 L
FEV6-Pre: 3.76 L
FEV6FVC-%Change-Post: 0 %
FEV6FVC-%Pred-Post: 103 %
FEV6FVC-%Pred-Pre: 104 %
FVC-%Change-Post: 6 %
FVC-%Pred-Post: 101 %
FVC-%Pred-Pre: 95 %
FVC-Post: 4.08 L
FVC-Pre: 3.82 L
Post FEV1/FVC ratio: 67 %
Post FEV6/FVC ratio: 98 %
Pre FEV1/FVC ratio: 68 %
Pre FEV6/FVC Ratio: 99 %
RV % pred: 122 %
RV: 2.55 L
TLC % pred: 109 %
TLC: 6.8 L

## 2019-06-19 NOTE — Progress Notes (Signed)
OV 05/08/2019  Subjective:  Patient ID: Brandon Lopez, male , DOB: [birth year] , age 64 y.o. , MRN: [id number] , ADDRESS: [address] [place] [address]   05/08/2019 -   Chief Complaint  Patient presents with  . Consult     HPI Brandon Lopez 64 y.o. -  [facility] Integrated Comprehensive ILD Questionnaire  Symptoms:  -Referred by Eric Form nurse practitioner for the lung cancer screening program.  Patient had lung cancer low-dose CT scan of the chest in December 2020 that showed emphysema associated with ILD changes.  Therefore has been referred here.  There is no high-resolution CT scan of the chest.  Patient reports insidious onset of shortness of breath for the last 10 months to a year.  Since it started it is the same.  Dyspnea is currently mild and particularly with heel and walking upstairs.  There is an associated cough that started 15 years ago since it started it is the same.  It is mild and severity there is associated white sputum.  He does clear his throat and does feel a tickle in the back of the throat but there is no hemoptysis.  Cough is not worse lying down.  Cough does not affect voice.  SYMPTOM SCALE - ILD 05/08/2019   O2 use ra  Shortness of Breath 0 -> 5 scale with 5 being worst (score 6 If unable to do)  At rest 0  Simple tasks - showers, clothes change, eating, shaving 0  Household (dishes, doing bed, laundry) 0  Shopping 0  Walking level at own pace 0  Walking up Stairs 1  Total (30-36) Dyspnea Score 1  How bad is your cough? yes  How bad is your fatigue x  How bad is nausea x  How bad is vomiting?  x  How bad is diarrhea? x  How bad is anxiety? x  How bad is depression x       Past Medical History :  -He reports positive for cardiomyopathy with ejection fraction 45% as I personally reviewed on the echo.  Positive for COPD.  Positive for arthritis.  He marked rheumatoid arthritis but he says he is never seen a  rheumatologist.  He states it could easily be osteoarthritis.  Positive for sleep apnea and hypertension.  He does have kidney stones and a history of pneumonia.  There is no confirmed diagnosis of collagen vascular disease or vasculitis.  He does have hypertension.  No diabetes no thyroid disease.   ROS:  -Positive for fatigue for the last few decades.  Positive for arthralgia.  He thinks he has lost 60 pounds of weight.  He has nausea occasionally.  There is occasional vomiting occasional acid reflux.  And occasional snoring despite CPAP use.  No rash or ulcers.  No dysphagia.  No Raynaud's   FAMILY HISTORY of LUNG DISEASE:  -Marked positive for hypersensitivity pneumonitis [need to inquire further].  Denies any pulmonary fibrosis or COPD in the family.  No autoimmune disease.   EXPOSURE HISTORY: * -Smoke cigarettes 1968 and onwards.  On average smokes 40 cigarettes/day.  He still smokes.  Does not live with any passive smoking.  No vaping or electronic cigarette use of marijuana use currently.  In the past used marijuana in 1972 for 1 joint a day.  In the past and 19 therapy also used cocaine very little.  No intravenous drug use.   HOME and HOBBY DETAILS : [place] age of the home is 60 years.  He has a CPAP but there is no moisture content or mildew in it.  No feather pillows or duvet.  No AC duct.  No gardening no wind instruments.  No mold or mildew in the shower curtains or anywhere in the bathroom.  No other organic antigen exposure.   OCCUPATIONAL HISTORY (122 questions) : Positive for asbestos exposure in the 1970s for 7 years.  He install asbestos.  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  He says the asbestos exposure was significant.  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  He has done some woodwork Brewing technologist work.  He is  also done metal grinding, welding and machine operations.  He has done Financial trader, pipe work up, Development worker, community, Armed forces technical officer, wood trim and furniture work.  He is done flooring work is done Psychologist, forensic work.  He is on Teacher, English as a foreign language work.  Of all these the asbestos exposure was the most significant.  Because he installed and removed asbestos.  Otherwise negative   PULMONARY TOXICITY HISTORY (27 items): Essentially negative.    Simple office walk 185 feet x  3 laps goal with forehead probe 05/08/2019   O2 used ra  Number laps completed 3  Comments about pace norma  Resting Pulse Ox/HR 98% and 70/min  Final Pulse Ox/HR 97% and 96/min  Desaturated </= 88% no  Desaturated <= 3% points no  Got Tachycardic >/= 90/min yes  Symptoms at end of test none  Miscellaneous comments x      ROS - per HPI   OV 06/19/2019  Subjective:  Patient ID: Brandon Lopez, male , DOB: 12/15/1955 , age 24 y.o. , MRN: [id number] , ADDRESS: [place] [address]   06/19/2019 -   Chief Complaint  Patient presents with  . Follow-up    f/u PFT    Follow-up heavy smoker Follow-up exposure to asbestos Follow-up concern for interstitial lung disease  HPI Brandon Lopez 64 y.o. -presents for follow-up.  This visit is to go over the results.  There was concern for interstitial lung disease on his low-dose CT scan of the chest.  He tells me he had low-dose CT scan of the chest for lung cancer surveillance dejection the last few years.  ILD has never been brought up with the most recent one there was concern for ILD.  Therefore had a high-resolution CT scan of the chest that is documented below.  I personally visualized this and reviewed it.  I agree with the radiologist findings there is no ILD.  Instead there is moderate emphysema.  Paradoxically his pulmonary function test is normal.  He feels stable.  He maintains himself on Symbicort he does not want to  change.  He continues to smoke.  He says he is used Chantix in the past and quit for 1 year but has relapsed.  He is interested in quitting smoking but he says that he needs to think about it and come up with a plan before he is fully motivated to quit smoking.  At that point in time he will make a visit to reconsider Chantix.  He is willing to have low-dose repeat CT scan of the chest for lung cancer surveillance in 1 year from now.  He has not had his Covid vaccine.  We discussed that  and he wants to wait and think about it.  He does not have any allergies to any injectables.  He has had other vaccines in the past without problem.     Simple office walk 185 feet x  3 laps goal with forehead probe 05/08/2019   O2 used ra  Number laps completed 3  Comments about pace norma  Resting Pulse Ox/HR 98% and 70/min  Final Pulse Ox/HR 97% and 96/min  Desaturated </= 88% no  Desaturated <= 3% points no  Got Tachycardic >/= 90/min yes  Symptoms at end of test none  Miscellaneous comments x   CT chest IMPRESSION: 1. Moderate to severe centrilobular emphysema. No evidence of fibrotic interstitial lung disease.  2. Diffuse bilateral bronchial wall thickening, consistent with nonspecific infectious or inflammatory bronchitis.  3.  No stigmata of asbestos exposure.  4.  Coronary artery disease.  Aortic atherosclerosis.   Electronically Signed   By: Eddie Candle M.D.   On: 05/19/2019 18:23  Results for BAYLER, BOYACK (MRN [id number]) as of 06/19/2019 10:57  Ref. Range 05/08/2019 10:43  Aspergillus Fumigatus Latest Ref Range: NEGATIVE  NEGATIVE  Pigeon Serum Latest Ref Range: NEGATIVE  NEGATIVE  Anti Nuclear Antibody (ANA) Latest Ref Range: NEGATIVE  NEGATIVE  Cyclic Citrullin Peptide Ab Latest Units: UNITS <16  ds DNA Ab Latest Units: IU/mL <1  RA Latex Turbid. 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   reports that he has been smoking cigarettes. He started smoking about 57 years ago. He has a 100.00 pack-year smoking history. He has never used smokeless tobacco.  Past Surgical History:  Procedure Laterality Date  . APPENDECTOMY  age 62  . CARDIAC CATHETERIZATION     7 years ago  . COLONOSCOPY N/A 05/27/2014   Procedure: COLONOSCOPY;  Surgeon: Hulen Luster, MD;  [facility] END[facility]COPY;  Service: Gastroenterology;  Laterality: N/A;  . INSERT / REPLACE / REMOVE PACEMAKER     3 years ago    Allergies  Allergen Reactions  . Simvastatin Diarrhea  . Lisinopril Cough    Immunization History  Administered Date(s) Administered  . Influenza,inj,Quad PF,6+ Mos 11/15/2014, 11/05/2015, 12/23/2016, 12/06/2017, 12/12/2018  . Pneumococcal Polysaccharide-23 11/04/2014  . Td 10/26/2011  . Zoster 11/18/2015    Family History  Problem Relation Age of Onset  . Arthritis Mother   . Heart disease Father   . Hyperlipidemia Father   . Hypertension Father   . Cancer Paternal Grandfather      Current Outpatient Medications:  .  albuterol (PROAIR HFA) 108 (90 Base) MCG/ACT inhaler, Inhale 2 puffs into the lungs every 6 (six) hours as needed for wheezing or shortness of breath., Disp: 18 g, Rfl: 2 .  atorvastatin (LIPITOR) 20 MG tablet, Take 1 tablet by mouth in the evening, Disp: 90 tablet, Rfl: 2 .  carvedilol (COREG) 3.125 MG tablet, Take 1 tablet (3.125 mg total) by mouth 2 (two) times  daily., Disp: 180 tablet, Rfl: 3 .  ezetimibe (ZETIA) 10 MG tablet, Take 1 tablet (10 mg total) by mouth daily., Disp: 30 tablet, Rfl: 8 .  loratadine (CLARITIN) 10 MG tablet, Take 10 mg by mouth daily as needed for allergies. , Disp: , Rfl:  .  SYMBICORT 160-4.5 MCG/ACT inhaler, Inhale 2 puffs by mouth twice daily, Disp: 33 g, Rfl: 1 .  telmisartan-hydrochlorothiazide (MICARDIS HCT) 40-12.5 MG tablet, Take 1 tablet by mouth once daily, Disp: 90 tablet, Rfl: 3      Objective:   Vitals:   06/19/19 1110  BP: 128/72  Pulse: 70  Temp: 97.9 F (36.6 C)  TempSrc: Oral  SpO2: 94%  Weight: 187 lb 12.8 oz (85.2 kg)  Height: 5\' 6"  (1.676 m)    Estimated body mass index is 30.31 kg/m as calculated from the following:   Height as of this encounter: 5\' 6"  (1.676 m).   Weight as of this encounter: 187 lb 12.8 oz (85.2 kg).  @WEIGHTCHANGE @  [employer]   06/19/19 1110  Weight: 187 lb 12.8 oz (85.2 kg)     Physical Exam Well-built male nonfocal exam no crackles.  Clubbing present.  Normal heart sounds.  Alert and oriented x3.  No rash.  No stigmata of connective tissue disease.     Assessment:       ICD-10-CM   1. Pulmonary emphysema, unspecified emphysema type ([place])  J43.9   2. History of exposure to asbestos  Z77.090   3. Cigarette smoker  F17.210   4. Vaccine counseling  Z71.89        Plan:     Patient Instructions  Pulmonary emphysema, unspecified emphysema type ([place])    You have emphsyema You do  Not have fibrosios on CT chest  Plan  - cotninue symbicort scheudled with albuterol as needed - when you want to downsize we can look at change to spiriva  - check alpha 1 AT at next visit   History of exposure to asbestos  - No evidence of asbestosis or asbestos pleural plaques in the current CT   Plan -Routine monitoring expectant follow-up   Cigarette smoker  -Counseled to quit.  He will think about Chantix and let us know  Vaccine counseling  -Strongly  advocate Covid 19 vaccine  Cancer screening  -Low-dose CT scan chest sometime in June or July 2022 for lung cancer  Follow-up -1 year but after low-dose CT scan of the chest -Return sooner if needed especially if you want to schedule a quit smoking visit you can do a telephone visit for that       SIGNATURE    Dr. Brand Males, M.D., F.C.C.P,  Pulmonary and Critical Care Medicine Staff Physician, [place] Director - Interstitial Lung Disease  Program  Pulmonary [place] [facility] [place], [place], [address]  Pager: [phone], If no answer or between  15:00h - 7:00h: call [phone] Telephone: [phone]  11:26 AM 06/19/2019

## 2019-06-19 NOTE — Patient Instructions (Addendum)
Pulmonary emphysema, unspecified emphysema type (Brandon Lopez)    You have emphsyema You do  Not have fibrosios on CT chest  Plan  - cotninue symbicort scheudled with albuterol as needed - when you want to downsize we can look at change to spiriva  - check alpha 1 AT at next visit   History of exposure to asbestos  - No evidence of asbestosis or asbestos pleural plaques in the current CT   Plan -Routine monitoring expectant follow-up   Cigarette smoker  -Counseled to quit.  He will think about Chantix and let us know  Vaccine counseling  -Strongly advocate Covid 19 vaccine  Cancer screening  -Low-dose CT scan chest sometime in June or July 2022 for lung cancer  Follow-up -1 year but after low-dose CT scan of the chest -Return sooner if needed especially if you want to schedule a quit smoking visit you can do a telephone visit for that

## 2019-06-19 NOTE — Progress Notes (Signed)
PFT done today. 

## 2019-07-06 ENCOUNTER — Telehealth: Payer: Self-pay

## 2019-07-06 DIAGNOSIS — I480 Paroxysmal atrial fibrillation: Secondary | ICD-10-CM

## 2019-07-06 DIAGNOSIS — I10 Essential (primary) hypertension: Secondary | ICD-10-CM

## 2019-07-06 NOTE — Telephone Encounter (Signed)
Per written referral from PCP, requesting referral in Epic for Brandon Lopez to chronic care management pharmacy services for the following conditions:   Essential hypertension, benign  [I10]  PAF (paroxysmal atrial fibrillation) ([place]) [I48.0]  [name], PharmD Clinical Pharmacist [facility] [phone]

## 2019-07-08 NOTE — Chronic Care Management (AMB) (Signed)
Chronic Care Management Pharmacy  Name: Brandon Lopez  MRN: [id number] DOB: [birth year]  Chief Complaint/ HPI  Brandon Lopez,  64 y.o. , male presents for their Initial CCM visit with the clinical pharmacist via telephone.  PCP : Pleas Koch, NP  Their chronic conditions include: hypertension, PAF, OSA, COPD, OA, diverticulosis, anxiety and depression, hyperlipidemia, tobacco use, alcohol abuse   Patient concerns: denies medication concerns  Office Visits:  12/18/18: [facility] for mild glenohumeral osteoarthritis, career in Architect and manual labor, still works occasionally, rates pain as 10 out of 10, likely exacerbation of arthritis, given depo-medrol 40 mg injection   12/12/18: PCP visit for chronic left shoulder pain, prior on gabapentin per orthopedics, ineffective, now taking 3 Advil every morning for stiffness/pain, fair diet, 6-8 beers daily, xray today, begin exercising - 150 minutes weekly, improve diet, increase water intake and reduce alcohol  Consult Visit:  06/19/19: Pulmonology - no ILD, positive for moderate emphysema with COPD, PFT nomral maintained on Symbicort, denies change in therapy, feels stable, continues to smoke, used Chantix in the past and quit for 1 year then relapsed, willing to think about quitting again, has not had Covid vaccine   02/21/19: Cardiology - continue current medications   Allergies  Allergen Reactions  . Simvastatin Diarrhea  . Lisinopril Cough   Medications: Outpatient Encounter Medications as of 07/09/2019  Medication Sig  . albuterol (PROAIR HFA) 108 (90 Base) MCG/ACT inhaler Inhale 2 puffs into the lungs every 6 (six) hours as needed for wheezing or shortness of breath.  Marland Kitchen atorvastatin (LIPITOR) 20 MG tablet Take 1 tablet by mouth in the evening  . carvedilol (COREG) 3.125 MG tablet Take 1 tablet (3.125 mg total) by mouth 2 (two) times daily.  Marland Kitchen ezetimibe (ZETIA) 10 MG tablet Take 1 tablet (10 mg total) by  mouth daily.  Marland Kitchen loratadine (CLARITIN) 10 MG tablet Take 10 mg by mouth daily as needed for allergies.   . SYMBICORT 160-4.5 MCG/ACT inhaler Inhale 2 puffs by mouth twice daily  . telmisartan-hydrochlorothiazide (MICARDIS HCT) 40-12.5 MG tablet Take 1 tablet by mouth once daily   No facility-administered encounter medications on file as of 07/09/2019.   Current Diagnosis/Assessment:   Emergency planning/management officer Strain: Low Risk   . Difficulty of Paying Living Expenses: Not very hard   Goals Addressed            This Visit's Progress   . Pharmacy Care Plan       CARE PLAN ENTRY  Current Barriers:  . Chronic Disease Management support, education, and care coordination needs related to Hypertension, Hyperlipidemia, and Tobacco use   Hypertension . Pharmacist Clinical Goal(s): o Over the next 6 months, patient will work with PharmD and providers to maintain BP goal <140/90 mmHg . Current regimen:   Carvedilol 3.125 mg - 1 tablet twice daily  Telmisartan-HCTZ 40-12.5 mg - 1 tablet daily  . Interventions: o Reviewed clinic readings and blood pressure is well controlled . Patient self care activities - Over the next 6 months, patient will: o Check BP once monthly, document, and provide at future appointments o Ensure daily salt intake < 2300 mg/day  Hyperlipidemia . Pharmacist Clinical Goal(s): o Over the next 6 months, patient will work with PharmD and providers to maintain LDL goal < 100  . Current regimen:  . Atorvastatin 20 mg - 1 tablet daily . Ezetimibe 10 mg - 1 tablet daily . Interventions: o Reviewed adherence and safety  . Patient self  care activities - Over the next 6 months, patient will: o Continue current medications as prescribed  Tobacco Use/Alcohol  . Pharmacist Clinical Goal(s) o Over the next 6 months, patient will work with PharmD and providers towards reducing tobacco and alcohol use . Current regimen:  o No pharmacotherapy - tried Chantix before for tobacco  cessation o Reports 3-4 successful quit attempts in past . Current tobacco use: 2 packs of cigarettes/day . Current alcohol use: 6-12 beers/day . Interventions: o Encouraged tobacco cessation; offered medication management and support  o Encouraged reducing alcohol slowly to recommended intake at 2 drinks/day . Patient self care activities - Over the next 6 months, patient will: o Consider slowly cutting back on cigarette use  o Consider reducing alcohol intake with goal of 2 or less beers per day o Please call if you are interested in medication to reduce tobacco or alcohol cravings   Initial goal documentation      Hypertension   CMP Latest Ref Rng & Units 12/12/2018 12/06/2017 08/18/2017  Glucose 70 - 99 mg/dL 85 82 78  BUN 6 - 23 mg/dL 11 10 9   Creatinine 0.40 - 1.50 mg/dL 0.75 0.76 0.70  Sodium 135 - 145 mEq/L 137 139 138  Potassium 3.5 - 5.1 mEq/L 4.3 3.9 3.6  Chloride 96 - 112 mEq/L 103 102 101  CO2 19 - 32 mEq/L 26 30 30   Calcium 8.4 - 10.5 mg/dL 9.2 9.2 9.5  Total Protein 6.0 - 8.3 g/dL 6.7 6.6 -  Total Bilirubin 0.2 - 1.2 mg/dL 0.7 0.7 -  Alkaline Phos 39 - 117 U/L 64 69 -  AST 0 - 37 U/L 16 17 -  ALT 0 - 53 U/L 26 27 -   Office blood pressures are: BP Readings from Last 3 Encounters:  06/19/19 128/72  05/08/19 132/76  02/21/19 120/90   BP goal < 140/90 mmHg Patient has failed these meds in the past: none Patient checks BP at home: infrequently if symptomatic Patient home BP readings are ranging: none reported  Patient is currently controlled on the following medications:   Carvedilol 3.125 mg - 1 tablet BID  Telmisartan-HCTZ 40-12.5 mg - 1 tablet daily   Adherence: confirms adherence, denies missed doses; denies dizziness or falls   Plan: Continue current medications  Hyperlipidemia/CAD   Lipid Panel     Component Value Date/Time   CHOL 124 12/12/2018 1030   TRIG 73.0 12/12/2018 1030   HDL 37.90 (L) 12/12/2018 1030   LDLCALC 71 12/12/2018 1030      The ASCVD Risk score (Goff DC Jr., et al., 2013) failed to calculate for the following reasons:   The valid total cholesterol range is 130 to 320 mg/dL   CBC Latest Ref Rng & Units 12/12/2018 08/09/2017 05/27/2016  WBC 4.0 - 10.5 K/uL 8.8 9.6 7.6  Hemoglobin 13.0 - 17.0 g/dL 16.0 15.7 17.2(H)  Hematocrit 39 - 52 % 46.2 44.2 49.7  Platelets 150 - 400 K/uL 217.0 222 237.0   LDL goal < 100 Patient has failed these meds in past: per cardio, unable to tolerate higher dose of Lipitor Patient is currently controlled on the following medications:  . Atorvastatin 20 mg - 1 tablet daily . Ezetimibe 10 mg - 1 tablet daily . Aspirin 81 mg - 1 tablet daily   Adherence: confirms adherence, denies missed doses or adverse effects We discussed: denies abnormal bruising/bleeding, CBC stable   Plan: Continue current medications  Paroxysmal AFIB   Pacemaker - followed by  Dr. Caryl Comes Patient is currently rate controlled.  Patient has tried these meds in past: Xarelto, discontinued per Dr. Caryl Comes 09/2017, did not think < 12 hours of AFIB warranted anticoagulation in pt without history of stroke   Patient is currently controlled on the following medications:  Carvedilol 3.125 mg - 1 tablet BID  We discussed: denies symptoms of AFIB  Plan: Continue current medications  COPD / Tobacco   Moderate emphysema per last pulmonology note  Eosinophil count:   Lab Results  Component Value Date/Time   EOSPCT 1.0 05/27/2016 12:00 PM   EOSPCT 1.7 02/13/2014 04:35 AM  %                               Eos (Absolute):  Lab Results  Component Value Date/Time   EOSABS 0.1 05/27/2016 12:00 PM   EOSABS 0.2 02/13/2014 04:35 AM   Tobacco Status:  Social History   Tobacco Use  Smoking Status Heavy Tobacco Smoker  . Packs/day: 2.00  . Years: 50.00  . Pack years: 100.00  . Types: Cigarettes  . Start date: 1964  Smokeless Tobacco Never Used  Tobacco Comment   started smoking at age 50, significant asbestos  exposure   Patient has failed these meds in past: none reported Patient is currently controlled on the following medications:   Symbicort 160-4.5 mcg/act - 2 puffs BID  Albuterol 90 mcg - 2 puffs q6h PRN  Loratadine 10 mg daily PRN  Using maintenance inhaler regularly? Yes Frequency of rescue inhaler use: has not used in several months, used occasionally during allergy season  We discussed: happy with current treatment, report symptoms do not interfere with daily activities  Tobacco use: quit 3-4 times over the years; current use is about 2 PPD, encouraged smoking cessation, currently not ready to quit in the short term, thinking about quitting in the next several years Alcohol: 6-12 beers/day, brand - natural light, encouraged limiting intake slowly to 2 drinks/day  Plan: Continue current medications; Continue to encourage tobacco cessation and reduced alcohol intake.   Constipation   Patient has failed these meds in past: none reported Patient is currently controlled on the following medications:   Dulcolax Stool Softener PRN (taking 3-4 days per week)  We discussed: reports some straining with bowel movements, incomplete passing of stool; started stool softener in December after episode of hemorrhoids; reports drinking 4-5 cups of coffee every morning and very minimal water  Plan: Continue current medications; Recommend increasing water intake. Mail constipation handout.   Vaccines   Reviewed and discussed patient's vaccination history.    Immunization History  Administered Date(s) Administered  . Influenza,inj,Quad PF,6+ Mos 11/15/2014, 11/05/2015, 12/23/2016, 12/06/2017, 12/12/2018  . Pneumococcal Polysaccharide-23 11/04/2014  . Td 10/26/2011  . Zoster 11/18/2015   Plan: Recommended patient receive Shingrix.   Medication Management  OTCs: denies additional OTCs  Pharmacy/Benefits: [facility]  Adherence: refills timely, 90 DS  Affordability: no concerns  CCM  Follow Up: 01/08/20 at 10:00 AM (telephone)  [name], PharmD Clinical Pharmacist [facility] [phone]

## 2019-07-09 ENCOUNTER — Other Ambulatory Visit: Payer: Self-pay

## 2019-07-09 ENCOUNTER — Ambulatory Visit: Payer: PPO

## 2019-07-09 DIAGNOSIS — I10 Essential (primary) hypertension: Secondary | ICD-10-CM

## 2019-07-09 DIAGNOSIS — Z720 Tobacco use: Secondary | ICD-10-CM

## 2019-07-09 DIAGNOSIS — E785 Hyperlipidemia, unspecified: Secondary | ICD-10-CM

## 2019-07-09 NOTE — Telephone Encounter (Signed)
Noted. Referral sent as requested.

## 2019-07-09 NOTE — Patient Instructions (Addendum)
July 09, 2019  Dear Brandon Lopez,  It was a pleasure meeting you during our initial appointment on July 09, 2019. Below is a summary of the goals we discussed and components of chronic care management. Please contact me anytime with questions or concerns.   Visit Information  Goals Addressed            This Visit's Progress   . Pharmacy Care Plan       CARE PLAN ENTRY  Current Barriers:  . Chronic Disease Management support, education, and care coordination needs related to Hypertension, Hyperlipidemia, and Tobacco use   Hypertension . Pharmacist Clinical Goal(s): o Over the next 6 months, patient will work with PharmD and providers to maintain BP goal <140/90 mmHg . Current regimen:   Carvedilol 3.125 mg - 1 tablet twice daily  Telmisartan-HCTZ 40-12.5 mg - 1 tablet daily  . Interventions: o Reviewed clinic readings and blood pressure is well controlled . Patient self care activities - Over the next 6 months, patient will: o Check BP once monthly, document, and provide at future appointments o Ensure daily salt intake < 2300 mg/day  Hyperlipidemia . Pharmacist Clinical Goal(s): o Over the next 6 months, patient will work with PharmD and providers to maintain LDL goal < 100  . Current regimen:  . Atorvastatin 20 mg - 1 tablet daily . Ezetimibe 10 mg - 1 tablet daily . Interventions: o Reviewed adherence and safety  . Patient self care activities - Over the next 6 months, patient will: o Continue current medications as prescribed  Tobacco Use/Alcohol  . Pharmacist Clinical Goal(s) o Over the next 6 months, patient will work with PharmD and providers towards reducing tobacco and alcohol use . Current regimen:  o No pharmacotherapy - tried Chantix before for tobacco cessation o Reports 3-4 successful quit attempts in past . Current tobacco use: 2 packs of cigarettes/day . Current alcohol use: 6-12 beers/day . Interventions: o Encouraged tobacco cessation;  offered medication management and support  o Encouraged reducing alcohol slowly to recommended intake at 2 drinks/day . Patient self care activities - Over the next 6 months, patient will: o Consider slowly cutting back on cigarette use  o Consider reducing alcohol intake with goal of 2 or less beers per day o Please call if you are interested in medication to reduce tobacco or alcohol cravings   Initial goal documentation       Brandon Lopez was given information about Chronic Care Management services today including:  1. CCM service includes personalized support from designated clinical staff supervised by his physician, including individualized plan of care and coordination with other care providers 2. 24/7 contact phone numbers for assistance for urgent and routine care needs. 3. Standard insurance, coinsurance, copays and deductibles apply for chronic care management only during months in which we provide at least 20 minutes of these services. Most insurances cover these services at 100%, however patients may be responsible for any copay, coinsurance and/or deductible if applicable. This service may help you avoid the need for more expensive face-to-face services. 4. Only one practitioner may furnish and bill the service in a calendar month. 5. The patient may stop CCM services at any time (effective at the end of the month) by phone call to the office staff.  Patient agreed to services and verbal consent obtained.   The patient verbalized understanding of instructions provided today and agreed to receive a mailed copy of patient instruction and/or educational materials. Telephone follow up  appointment with pharmacy team member scheduled for: 01/08/20 at 10:00 AM (telephone visit)   [name], PharmD Clinical Pharmacist [facility] [phone]   Constipation, Adult Constipation is when a person:  Poops (has a bowel movement) fewer times in a week  than normal.  Has a hard time pooping.  Has poop that is dry, hard, or bigger than normal. Follow these instructions at home: Eating and drinking   Eat foods that have a lot of fiber, such as: ? Fresh fruits and vegetables. ? Whole grains. ? Beans.  Eat less of foods that are high in fat, low in fiber, or overly processed, such as: ? [place] fries. ? Hamburgers. ? Cookies. ? Candy. ? Soda.  Drink enough fluid to keep your pee (urine) clear or pale yellow. General instructions  Exercise regularly or as told by your doctor.  Go to the restroom when you feel like you need to poop. Do not hold it in.  Take over-the-counter and prescription medicines only as told by your doctor. These include any fiber supplements.  Do pelvic floor retraining exercises, such as: ? Doing deep breathing while relaxing your lower belly (abdomen). ? Relaxing your pelvic floor while pooping.  Watch your condition for any changes.  Keep all follow-up visits as told by your doctor. This is important. Contact a doctor if:  You have pain that gets worse.  You have a fever.  You have not pooped for 4 days.  You throw up (vomit).  You are not hungry.  You lose weight.  You are bleeding from the anus.  You have thin, pencil-like poop (stool). Get help right away if:  You have a fever, and your symptoms suddenly get worse.  You leak poop or have blood in your poop.  Your belly feels hard or bigger than normal (is bloated).  You have very bad belly pain.  You feel dizzy or you faint. This information is not intended to replace advice given to you by your health care provider. Make sure you discuss any questions you have with your health care provider. Document Revised: 12/24/2016 Document Reviewed: 07/02/2015 Elsevier Patient Education  2020 [employer].

## 2019-07-12 NOTE — Progress Notes (Signed)
I have collaborated with the care management provider regarding care management and care coordination activities outlined in this encounter and have reviewed this encounter including documentation in the note and care plan. I am certifying that I agree with the content of this note and encounter as supervising physician.  

## 2019-08-13 ENCOUNTER — Other Ambulatory Visit: Payer: Self-pay | Admitting: Primary Care

## 2019-08-13 DIAGNOSIS — J449 Chronic obstructive pulmonary disease, unspecified: Secondary | ICD-10-CM

## 2019-08-16 ENCOUNTER — Ambulatory Visit: Payer: PPO | Admitting: Primary Care

## 2019-08-17 ENCOUNTER — Ambulatory Visit (INDEPENDENT_AMBULATORY_CARE_PROVIDER_SITE_OTHER)
Admission: RE | Admit: 2019-08-17 | Discharge: 2019-08-17 | Disposition: A | Discharge status: Home / Self Care | Payer: PPO | Source: Ambulatory Visit | Attending: Primary Care | Admitting: Primary Care

## 2019-08-17 ENCOUNTER — Other Ambulatory Visit: Payer: Self-pay

## 2019-08-17 ENCOUNTER — Ambulatory Visit (INDEPENDENT_AMBULATORY_CARE_PROVIDER_SITE_OTHER): Payer: PPO | Admitting: Primary Care

## 2019-08-17 ENCOUNTER — Encounter: Payer: Self-pay | Admitting: Primary Care

## 2019-08-17 VITALS — BP 124/82 | HR 62 | Temp 95.7°F | Ht 66.0 in | Wt 191.0 lb

## 2019-08-17 DIAGNOSIS — M5442 Lumbago with sciatica, left side: Secondary | ICD-10-CM

## 2019-08-17 DIAGNOSIS — M549 Dorsalgia, unspecified: Secondary | ICD-10-CM | POA: Insufficient documentation

## 2019-08-17 DIAGNOSIS — M545 Low back pain: Secondary | ICD-10-CM | POA: Diagnosis not present

## 2019-08-17 DIAGNOSIS — G8929 Other chronic pain: Secondary | ICD-10-CM | POA: Diagnosis not present

## 2019-08-17 DIAGNOSIS — I700 Atherosclerosis of aorta: Secondary | ICD-10-CM | POA: Diagnosis not present

## 2019-08-17 MED ORDER — CYCLOBENZAPRINE HCL 5 MG PO TABS: 5.0000 mg | ORAL_TABLET | Freq: Three times a day (TID) | ORAL | 0 refills | Status: DC | PRN

## 2019-08-17 NOTE — Patient Instructions (Signed)
Complete xray(s) prior to leaving today. I will notify you of your results once received.  You may take cyclobenzaprine 5 mg every 8 hours as needed for muscle spasm. This may cause drowsiness.  Be sure to stretch and remain active when possible.  It was a pleasure to see you today!

## 2019-08-17 NOTE — Progress Notes (Signed)
Subjective:    Patient ID: Brandon Lopez, male    DOB: 1955-03-16, 64 y.o.   MRN: [id number]  HPI  This visit occurred during the SARS-CoV-2 public health emergency.  Safety protocols were in place, including screening questions prior to the visit, additional usage of staff PPE, and extensive cleaning of exam room while observing appropriate contact time as indicated for disinfecting solutions.   Brandon Lopez is a 64 year old male with a history of hypertension, paroxysmal atrial fibrillation, COPD, OSA, alcohol and tobacco abuse, neck pain, chronic shoulder pain who presents today with a chief complaint of back pain.   His pain is located to the mid lumbar spine which began four days ago after rising from bending forward.  Prior to his pain beginning he was bending forward in his bathroom into a lower cabinet, when raising up his pain began.   He's also noticed intermittent left lower extremity pain down to the upper lateral thigh. Some numbness. His pain and symptoms dissipate when sitting, but return with standing, ambulation, movement. He describes his pain as tightness, spasms.   He denies loss of bowel or bladder control, right sided lower extremity radiculopathy. He's not taken anything for his pain. Today his pain is slightly better as he's applied ice and stretching with slight improvement.   BP Readings from Last 3 Encounters:  08/17/19 124/82  06/19/19 128/72  05/08/19 132/76     Review of Systems  Musculoskeletal: Positive for arthralgias, back pain and myalgias.  Skin: Negative for color change.  Neurological: Positive for numbness. Negative for weakness.       Past Medical History:  Diagnosis Date  . Arthritis   . Chronic systolic CHF (congestive heart failure) ([place])    a. echo 6/16: EF 55-60%, Gr1DD, LA nl, PASP nl; b. echo 7/19: EF 40-45%, unable to exclude RWMA, Gr1DD, LA nl, RVSF nl, PASP nl  . COPD (chronic obstructive pulmonary disease) ([place])   .  Depression   . Diverticulosis    Found during colonoscopy  . Hypertension   . Kidney stones   . PAF (paroxysmal atrial fibrillation) ([place])    a. short episodes previously noted on PPM interrogation; b. started on Xarelto 07/2017 given further episodes of Afib noted during interrogation; c. CHADS2VASc at least 3 (CHF, HTN, vascualr disease with aortic calcifications noted on CT)  . 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 Presence of permanent cardiac pacemaker    a. Biotronik; Dayna Barker DR-T [id number]; placed 08/2012  . Sleep apnea   . Symptomatic bradycardia    a. s/p Biotronik; Dayna Barker DR-T [id number]; placed 08/2012 at [place] History  . Marital status: Single    Spouse name: Not on file  . Number of children: Not on file  . Years of education: Not on file  . Highest education level: Not on file  Occupational History  . Not on file  Tobacco Use  . Smoking status: Heavy Tobacco Smoker    Packs/day: 2.00    Years: 50.00    Pack years: 100.00    Types: Cigarettes    Start date: 1964  . Smokeless tobacco: Never Used  . Tobacco comment: started smoking at age 59, 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  Vaping Use  . Vaping Use: Never used  Substance and Sexual Activity  . Alcohol use: Yes    Alcohol/week: 8.0 - 14.0 standard drinks    Types: 8 - 14 Cans of beer per week  .  Drug use: Yes    Types: Marijuana  . Sexual activity: Not on file  Other Topics Concern  . Not on file  Social History Narrative  . Not on file   Social Determinants of Health   Financial Resource Strain: Low Risk   . Difficulty of Paying Living Expenses: Not very hard  Food Insecurity: No Food Insecurity  . Worried About Charity fundraiser in the Last Year: Never true  . Ran Out of Food in the Last Year: Never true  Transportation Needs: No Transportation Needs  . Lack of Transportation (Medical): No  . Lack of Transportation (Non-Medical): No  Physical Activity: Inactive  . Days of Exercise per  Week: 0 days  . Minutes of Exercise per Session: 0 min  Stress: No Stress Concern Present  . Feeling of Stress : Not at all  Social Connections:   . Frequency of Communication with Friends and Family:   . Frequency of Social Gatherings with Friends and Family:   . Attends Religious Services:   . Active Member of Clubs or Organizations:   . Attends Archivist Meetings:   Marland Kitchen Marital Status:   Intimate Partner Violence: Not At Risk  . Fear of Current or Ex-Partner: No  . Emotionally Abused: No  . Physically Abused: No  . Sexually Abused: No    Past Surgical History:  Procedure Laterality Date  . APPENDECTOMY  age 18  . CARDIAC CATHETERIZATION     7 years ago  . COLONOSCOPY N/A 05/27/2014   Procedure: COLONOSCOPY;  Surgeon: Hulen Luster, MD;  Location: [facility] ENDOSCOPY;  Service: Gastroenterology;  Laterality: N/A;  . INSERT / REPLACE / REMOVE PACEMAKER     3 years ago    Family History  Problem Relation Age of Onset  . Arthritis Mother   . Heart disease Father   . Hyperlipidemia Father   . Hypertension Father   . Cancer Paternal Grandfather     Allergies  Allergen Reactions  . Simvastatin Diarrhea  . Lisinopril Cough    Current Outpatient Medications on File Prior to Visit  Medication Sig Dispense Refill  . albuterol (PROAIR HFA) 108 (90 Base) MCG/ACT inhaler Inhale 2 puffs into the lungs every 6 (six) hours as needed for wheezing or shortness of breath. 18 g 2  . atorvastatin (LIPITOR) 20 MG tablet Take 1 tablet by mouth in the evening 90 tablet 2  . carvedilol (COREG) 3.125 MG tablet Take 1 tablet (3.125 mg total) by mouth 2 (two) times daily. 180 tablet 3  . ezetimibe (ZETIA) 10 MG tablet Take 1 tablet (10 mg total) by mouth daily. 30 tablet 8  . loratadine (CLARITIN) 10 MG tablet Take 10 mg by mouth daily as needed for allergies.     . SYMBICORT 160-4.5 MCG/ACT inhaler Inhale 2 puffs by mouth twice daily 33 g 1  . telmisartan-hydrochlorothiazide (MICARDIS HCT)  40-12.5 MG tablet Take 1 tablet by mouth once daily 90 tablet 3   No current facility-administered medications on file prior to visit.    BP 124/82   Pulse 62   Temp (!) 95.7 F (35.4 C) (Temporal)   Ht 5\' 6"  (1.676 m)   Wt 191 lb (86.6 kg)   SpO2 98%   BMI 30.83 kg/m    Objective:   Physical Exam Cardiovascular:     Rate and Rhythm: Normal rate and regular rhythm.  Pulmonary:     Effort: Pulmonary effort is normal.     Breath  sounds: Normal breath sounds.  Musculoskeletal:     Comments: Decrease in ROM with forward flexion. Negative straight leg raise bilaterally. 5/5 strength to lower extremities. Ambulates well.  Skin:    General: Skin is warm and dry.  Neurological:     Mental Status: He is alert.            Assessment & Plan:

## 2019-08-17 NOTE — Assessment & Plan Note (Signed)
Acute without trauma.  Overall exam is reassuring, appears to be more of a lumbar strain. No alarm signs.  Checking plain films of the lumbar spine given radiculopathy.   Rx for cyclobenzaprine provided to use PRN. Discussed stretching, activity.  He will update if no improvement.

## 2019-08-20 ENCOUNTER — Other Ambulatory Visit: Payer: Self-pay | Admitting: Primary Care

## 2019-08-20 DIAGNOSIS — M5442 Lumbago with sciatica, left side: Secondary | ICD-10-CM

## 2019-09-11 ENCOUNTER — Telehealth: Payer: Self-pay

## 2019-09-11 ENCOUNTER — Ambulatory Visit (INDEPENDENT_AMBULATORY_CARE_PROVIDER_SITE_OTHER): Payer: PPO | Admitting: *Deleted

## 2019-09-11 DIAGNOSIS — I495 Sick sinus syndrome: Secondary | ICD-10-CM | POA: Diagnosis not present

## 2019-09-11 LAB — CUP PACEART REMOTE DEVICE CHECK
Date Time Interrogation Session: 20210817080048
Implantable Lead Implant Date: 20140801
Implantable Lead Implant Date: 20140801
Implantable Lead Location: 753859
Implantable Lead Location: 753860
Implantable Lead Model: 362
Implantable Lead Serial Number: 29356730
Implantable Lead Serial Number: 29392661
Implantable Pulse Generator Implant Date: 20140801
Pulse Gen Serial Number: 68053398

## 2019-09-11 NOTE — Telephone Encounter (Signed)
Biotronik alert received 09/11/19 for AF buden 2%, last EGM recorded 08/29/19 showing AF w/ controlled rates. Called patient to verify if he is or is not taking [place]. Hx of SCAF AF <12 hours.

## 2019-09-11 NOTE — Telephone Encounter (Signed)
Called patient,. No answer, LMOVM.

## 2019-09-13 NOTE — Progress Notes (Signed)
Remote pacemaker transmission.   

## 2019-09-17 ENCOUNTER — Encounter: Payer: Self-pay | Admitting: Internal Medicine

## 2019-09-18 ENCOUNTER — Other Ambulatory Visit: Payer: Self-pay | Admitting: Primary Care

## 2019-09-18 DIAGNOSIS — E785 Hyperlipidemia, unspecified: Secondary | ICD-10-CM

## 2019-11-07 ENCOUNTER — Other Ambulatory Visit: Payer: Self-pay | Admitting: Primary Care

## 2019-11-07 ENCOUNTER — Telehealth: Payer: Self-pay | Admitting: *Deleted

## 2019-11-07 DIAGNOSIS — U071 COVID-19: Secondary | ICD-10-CM

## 2019-11-07 NOTE — Telephone Encounter (Signed)
Spoke with patient via phone, he sounds stable, but would benefit from monoclonal antibody treatment as soon as possible.  He is scheduled to receive the treatment on 11/08/2019 and 1:30 PM.  Instructions provided.  Orders placed.

## 2019-11-07 NOTE — Progress Notes (Signed)
I connected by phone with Brandon Lopez on 11/07/2019 at 4:21 PM to discuss the potential use of a new treatment for mild to moderate COVID-19 viral infection in non-hospitalized patients.  This patient is a 64 y.o. male that meets the FDA criteria for Emergency Use Authorization of COVID monoclonal antibody casirivimab/imdevimab or bamlanivimab/eteseviamb.  Has a (+) direct SARS-CoV-2 viral test result  Has mild or moderate COVID-19   Is NOT hospitalized due to COVID-19  Is within 10 days of symptom onset  Has at least one of the high risk factor(s) for progression to severe COVID-19 and/or hospitalization as defined in EUA.  Specific high risk criteria : Cardiovascular disease or hypertension and Chronic Lung Disease   I have spoken and communicated the following to the patient or parent/caregiver regarding COVID monoclonal antibody treatment:  1. FDA has authorized the emergency use for the treatment of mild to moderate COVID-19 in adults and pediatric patients with positive results of direct SARS-CoV-2 viral testing who are 39 years of age and older weighing at least 40 kg, and who are at high risk for progressing to severe COVID-19 and/or hospitalization.  2. The significant known and potential risks and benefits of COVID monoclonal antibody, and the extent to which such potential risks and benefits are unknown.  3. Information on available alternative treatments and the risks and benefits of those alternatives, including clinical trials.  4. Patients treated with COVID monoclonal antibody should continue to self-isolate and use infection control measures (e.g., wear mask, isolate, social distance, avoid sharing personal items, clean and disinfect "high touch" surfaces, and frequent handwashing) according to CDC guidelines.   5. The patient or parent/caregiver has the option to accept or refuse COVID monoclonal antibody treatment.  After reviewing this information with the  patient, the patient has agreed to receive one of the available covid 19 monoclonal antibodies and will be provided an appropriate fact sheet prior to infusion. Pleas Koch, NP 11/07/2019 4:21 PM

## 2019-11-07 NOTE — Telephone Encounter (Signed)
Patient called stating that he did a home covid test today and it was positive. Patient stated he started with symptoms Sunday and is getting worse each day. Patient complains of diarrhea, fever, cough, SOB and some difficulty breathing. Advised patient that Allie Bossier NP will be calling him shortly about setting him up for the infusion.

## 2019-11-08 ENCOUNTER — Ambulatory Visit (HOSPITAL_COMMUNITY)
Admission: RE | Admit: 2019-11-08 | Discharge: 2019-11-08 | Disposition: A | Discharge status: Home / Self Care | Payer: Medicare Other | Source: Ambulatory Visit | Attending: Pulmonary Disease | Admitting: Pulmonary Disease

## 2019-11-08 DIAGNOSIS — U071 COVID-19: Secondary | ICD-10-CM | POA: Diagnosis present

## 2019-11-08 DIAGNOSIS — Z23 Encounter for immunization: Secondary | ICD-10-CM | POA: Insufficient documentation

## 2019-11-08 MED ORDER — EPINEPHRINE 0.3 MG/0.3ML IJ SOAJ: 0.3000 mg | Freq: Once | INTRAMUSCULAR | Status: DC | PRN

## 2019-11-08 MED ORDER — SODIUM CHLORIDE 0.9 % IV SOLN: Freq: Once | INTRAVENOUS | Status: AC

## 2019-11-08 MED ORDER — ALBUTEROL SULFATE HFA 108 (90 BASE) MCG/ACT IN AERS: 2.0000 | INHALATION_SPRAY | Freq: Once | RESPIRATORY_TRACT | Status: DC | PRN

## 2019-11-08 MED ORDER — DIPHENHYDRAMINE HCL 50 MG/ML IJ SOLN: 50.0000 mg | Freq: Once | INTRAMUSCULAR | Status: DC | PRN

## 2019-11-08 MED ORDER — METHYLPREDNISOLONE SODIUM SUCC 125 MG IJ SOLR: 125.0000 mg | Freq: Once | INTRAMUSCULAR | Status: DC | PRN

## 2019-11-08 MED ORDER — FAMOTIDINE IN NACL 20-0.9 MG/50ML-% IV SOLN: 20.0000 mg | Freq: Once | INTRAVENOUS | Status: DC | PRN

## 2019-11-08 MED ORDER — SODIUM CHLORIDE 0.9 % IV SOLN: INTRAVENOUS | Status: DC | PRN

## 2019-11-08 NOTE — Discharge Instructions (Signed)

## 2019-11-08 NOTE — Progress Notes (Signed)
  Diagnosis: COVID-19  Physician:dr wright  Procedure: [facility] Infusion [facility] Med: bamlanivimab\etesevimab infusion - Provided patient with bamlanimivab\etesevimab fact sheet for patients, parents and caregivers prior to infusion.  Complications: No immediate complications noted.  Discharge: Discharged home   [place] 11/08/2019

## 2019-12-06 ENCOUNTER — Other Ambulatory Visit: Payer: Self-pay | Admitting: Internal Medicine

## 2019-12-06 NOTE — Telephone Encounter (Signed)
This is a [place] pt 

## 2019-12-11 ENCOUNTER — Ambulatory Visit (INDEPENDENT_AMBULATORY_CARE_PROVIDER_SITE_OTHER): Payer: PPO

## 2019-12-11 DIAGNOSIS — I495 Sick sinus syndrome: Secondary | ICD-10-CM | POA: Diagnosis not present

## 2019-12-11 LAB — CUP PACEART REMOTE DEVICE CHECK
Date Time Interrogation Session: 20211116074142
Implantable Lead Implant Date: 20140801
Implantable Lead Implant Date: 20140801
Implantable Lead Location: 753859
Implantable Lead Location: 753860
Implantable Lead Model: 362
Implantable Lead Serial Number: 29356730
Implantable Lead Serial Number: 29392661
Implantable Pulse Generator Implant Date: 20140801
Pulse Gen Serial Number: 68053398

## 2019-12-12 NOTE — Progress Notes (Signed)
Remote pacemaker transmission.   

## 2019-12-24 ENCOUNTER — Other Ambulatory Visit: Payer: Self-pay | Admitting: Primary Care

## 2019-12-24 DIAGNOSIS — E785 Hyperlipidemia, unspecified: Secondary | ICD-10-CM

## 2020-01-08 ENCOUNTER — Telehealth: Payer: PPO

## 2020-01-08 ENCOUNTER — Telehealth: Payer: Self-pay

## 2020-01-08 NOTE — Chronic Care Management (AMB) (Addendum)
Chronic Care Management Pharmacy Assistant   Name: Brandon Lopez  MRN: [id number] DOB: 1955-10-26  Reason for Encounter: Disease State  Patient Questions:  1.  Have you seen any other providers since your last visit? Yes, 08/17/19 [name], NP- PCP-    2.  Any changes in your medicines or health? Yes 08/17/19, [name] added short course of cyclobenzaprine for low back pain   PCP : Pleas Koch, NP  Allergies:   Allergies  Allergen Reactions   Simvastatin Diarrhea   Lisinopril Cough    Medications: Outpatient Encounter Medications as of 01/08/2020  Medication Sig   albuterol (PROAIR HFA) 108 (90 Base) MCG/ACT inhaler Inhale 2 puffs into the lungs every 6 (six) hours as needed for wheezing or shortness of breath.   atorvastatin (LIPITOR) 20 MG tablet Take 1 tablet by mouth in the evening   carvedilol (COREG) 3.125 MG tablet Take 1 tablet (3.125 mg total) by mouth 2 (two) times daily.   cyclobenzaprine (FLEXERIL) 5 MG tablet Take 1 tablet (5 mg total) by mouth 3 (three) times daily as needed for muscle spasms.   ezetimibe (ZETIA) 10 MG tablet Take 1 tablet (10 mg total) by mouth daily.   loratadine (CLARITIN) 10 MG tablet Take 10 mg by mouth daily as needed for allergies.    SYMBICORT 160-4.5 MCG/ACT inhaler Inhale 2 puffs by mouth twice daily   telmisartan-hydrochlorothiazide (MICARDIS HCT) 40-12.5 MG tablet Take 1 tablet by mouth once daily   No facility-administered encounter medications on file as of 01/08/2020.    Current Diagnosis: Patient Active Problem List   Diagnosis Date Noted   Acute midline low back pain with left-sided sciatica 08/17/2019   Chronic shoulder pain 12/12/2018   Rectal bleeding 09/19/2018   Paroxysmal atrial fibrillation ([place]) 10/31/2017   Syncope 08/10/2017   Osteoarthritis 03/17/2017   Edema 07/21/2016   Cigarette smoker 11/17/2015   Diverticulosis 12/16/2014   Chronic obstructive pulmonary disease ([place])  11/26/2014   Cervical pain (neck) 10/14/2014   Hyperlipidemia 07/11/2014   Preventative health care 07/11/2014   Pacemaker 06/03/2014   Anxiety and depression 06/03/2014   Alcohol abuse 06/03/2014   OSA (obstructive sleep apnea) 06/03/2014   Essential hypertension 06/03/2014   Tobacco abuse 06/03/2014   Reviewed chart prior to disease state call. Spoke with patient regarding BP  Recent Office Vitals: BP Readings from Last 3 Encounters:  11/08/19 105/84  08/17/19 124/82  06/19/19 128/72   Pulse Readings from Last 3 Encounters:  11/08/19 74  08/17/19 62  06/19/19 70    Wt Readings from Last 3 Encounters:  08/17/19 191 lb (86.6 kg)  06/19/19 187 lb 12.8 oz (85.2 kg)  05/08/19 186 lb 12.8 oz (84.7 kg)     Kidney Function Lab Results  Component Value Date/Time   CREATININE 0.75 12/12/2018 10:30 AM   CREATININE 0.76 12/06/2017 09:24 AM   CREATININE 0.75 02/13/2014 04:35 AM   CREATININE 0.74 02/12/2014 01:54 PM   GFR 105.10 12/12/2018 10:30 AM   GFRNONAA >60 08/09/2017 08:38 PM   GFRNONAA >60 02/13/2014 04:35 AM   GFRAA >60 08/09/2017 08:38 PM   GFRAA >60 02/13/2014 04:35 AM    BMP Latest Ref Rng & Units 12/12/2018 12/06/2017 08/18/2017  Glucose 70 - 99 mg/dL 85 82 78  BUN 6 - 23 mg/dL 11 10 9   Creatinine 0.40 - 1.50 mg/dL 0.75 0.76 0.70  Sodium 135 - 145 mEq/L 137 139 138  Potassium 3.5 - 5.1 mEq/L 4.3  3.9 3.6  Chloride 96 - 112 mEq/L 103 102 101  CO2 19 - 32 mEq/L 26 30 30   Calcium 8.4 - 10.5 mg/dL 9.2 9.2 9.5    Current antihypertensive regimen:  Carvedilol 3.125 mg - 1 tablet twice daily Telmisartan-HCTZ 40-12.5 mg - 1 tablet daily   How often are you checking your Blood Pressure? infrequently - asked patient to check for the next 7 days and will follow up 01/15/20 around 8 AM to review   Current home BP readings: none reported  Follow-Up:  Pharmacist Review   [name], CPP notified  [name], [place] [phone]  I have reviewed the care management and care coordination activities outlined in this encounter and I am certifying that I agree with the content of this note. No further action required.  [name], PharmD Clinical Pharmacist [facility] at [facility] [phone]

## 2020-01-11 ENCOUNTER — Other Ambulatory Visit: Payer: Self-pay

## 2020-01-11 ENCOUNTER — Ambulatory Visit (INDEPENDENT_AMBULATORY_CARE_PROVIDER_SITE_OTHER): Payer: PPO | Admitting: Primary Care

## 2020-01-11 ENCOUNTER — Encounter: Payer: Self-pay | Admitting: Primary Care

## 2020-01-11 VITALS — BP 134/80 | HR 68 | Temp 97.1°F | Ht 65.0 in | Wt 191.0 lb

## 2020-01-11 DIAGNOSIS — F32A Depression, unspecified: Secondary | ICD-10-CM

## 2020-01-11 DIAGNOSIS — E785 Hyperlipidemia, unspecified: Secondary | ICD-10-CM | POA: Diagnosis not present

## 2020-01-11 DIAGNOSIS — G8929 Other chronic pain: Secondary | ICD-10-CM

## 2020-01-11 DIAGNOSIS — M545 Low back pain, unspecified: Secondary | ICD-10-CM

## 2020-01-11 DIAGNOSIS — Z125 Encounter for screening for malignant neoplasm of prostate: Secondary | ICD-10-CM | POA: Diagnosis not present

## 2020-01-11 DIAGNOSIS — F419 Anxiety disorder, unspecified: Secondary | ICD-10-CM

## 2020-01-11 DIAGNOSIS — F1010 Alcohol abuse, uncomplicated: Secondary | ICD-10-CM | POA: Diagnosis not present

## 2020-01-11 DIAGNOSIS — G4733 Obstructive sleep apnea (adult) (pediatric): Secondary | ICD-10-CM

## 2020-01-11 DIAGNOSIS — I10 Essential (primary) hypertension: Secondary | ICD-10-CM | POA: Diagnosis not present

## 2020-01-11 DIAGNOSIS — Z23 Encounter for immunization: Secondary | ICD-10-CM

## 2020-01-11 DIAGNOSIS — Z950 Presence of cardiac pacemaker: Secondary | ICD-10-CM | POA: Diagnosis not present

## 2020-01-11 DIAGNOSIS — Z0000 Encounter for general adult medical examination without abnormal findings: Secondary | ICD-10-CM | POA: Diagnosis not present

## 2020-01-11 DIAGNOSIS — I480 Paroxysmal atrial fibrillation: Secondary | ICD-10-CM

## 2020-01-11 DIAGNOSIS — J449 Chronic obstructive pulmonary disease, unspecified: Secondary | ICD-10-CM | POA: Diagnosis not present

## 2020-01-11 LAB — LIPID PANEL
Cholesterol: 137 mg/dL (ref 0–200)
HDL: 44.8 mg/dL (ref >39.00)
LDL Cholesterol: 63 mg/dL (ref 0–99)
NonHDL: 92.49
Total CHOL/HDL Ratio: 3
Triglycerides: 147 mg/dL (ref 0.0–149.0)
VLDL: 29.4 mg/dL (ref 0.0–40.0)

## 2020-01-11 LAB — CBC
HCT: 45.1 % (ref 39.0–52.0)
Hemoglobin: 15.8 g/dL (ref 13.0–17.0)
MCHC: 35.1 g/dL (ref 30.0–36.0)
MCV: 94.8 fl (ref 78.0–100.0)
Platelets: 227 10*3/uL (ref 150.0–400.0)
RBC: 4.76 Mil/uL (ref 4.22–5.81)
RDW: 12.7 % (ref 11.5–15.5)
WBC: 7.9 10*3/uL (ref 4.0–10.5)

## 2020-01-11 LAB — COMPREHENSIVE METABOLIC PANEL
ALT: 28 U/L (ref 0–53)
AST: 22 U/L (ref 0–37)
Albumin: 4.2 g/dL (ref 3.5–5.2)
Alkaline Phosphatase: 74 U/L (ref 39–117)
BUN: 9 mg/dL (ref 6–23)
CO2: 27 mEq/L (ref 19–32)
Calcium: 9.1 mg/dL (ref 8.4–10.5)
Chloride: 100 mEq/L (ref 96–112)
Creatinine, Ser: 0.76 mg/dL (ref 0.40–1.50)
GFR: 95.1 mL/min (ref >60.00)
Glucose, Bld: 92 mg/dL (ref 70–99)
Potassium: 3.6 mEq/L (ref 3.5–5.1)
Sodium: 134 mEq/L — ABNORMAL LOW (ref 135–145)
Total Bilirubin: 0.8 mg/dL (ref 0.2–1.2)
Total Protein: 6.8 g/dL (ref 6.0–8.3)

## 2020-01-11 LAB — PSA, MEDICARE: PSA: 0.24 ng/ml (ref 0.10–4.00)

## 2020-01-11 LAB — HEMOGLOBIN A1C: Hgb A1c MFr Bld: 5.6 % (ref 4.6–6.5)

## 2020-01-11 MED ORDER — TELMISARTAN-HCTZ 40-12.5 MG PO TABS: 1.0000 | ORAL_TABLET | Freq: Every day | ORAL | 0 refills | Status: DC

## 2020-01-11 NOTE — Assessment & Plan Note (Signed)
Denies concerns today. °Continue to monitor. °

## 2020-01-11 NOTE — Assessment & Plan Note (Signed)
No use of CPAP machine in quite some time.

## 2020-01-11 NOTE — Assessment & Plan Note (Signed)
Chronic, intermittent with numbness to lower extremities at thigh level at times.  No alarm signs. Xray of lumbar spine from July 2021 overall unremarkable.  Discussed to work on stretching, weight loss.  Offered PT for which he kindly declines.  He will update if symptoms do not improve.

## 2020-01-11 NOTE — Assessment & Plan Note (Signed)
Last remote check was in November 2021, follows with Dr. Caryl Comes.

## 2020-01-11 NOTE — Assessment & Plan Note (Signed)
Borderline today, continue carvedilol 3.125 mg BID, telmisartan-HCTZ 40-12.5 mg. CMP pending.

## 2020-01-11 NOTE — Assessment & Plan Note (Signed)
Stable, follows with pulmonology. Doing well on Symbicort, no recent use of albuterol.  Lung cancer screening UTD.

## 2020-01-11 NOTE — Assessment & Plan Note (Signed)
Influenza vaccination provided today, strongly advised he complete the Covid-19 series.  PSA due and pending. Colonoscopy UTD, due in 2026. Lung cancer screening UTD.  Discussed the importance of a healthy diet and regular exercise in order for weight loss, and to reduce the risk of any potential medical problems.  Exam today stable. Labs pending.

## 2020-01-11 NOTE — Patient Instructions (Signed)
I recommend that you stop drinking all alcohol.  Start exercising. You should be getting 150 minutes of moderate intensity exercise weekly.  It's important to improve your diet by reducing consumption of fast food, fried food, processed snack foods, sugary drinks. Increase consumption of fresh vegetables and fruits, whole grains, water.  Ensure you are drinking 64 ounces of water daily.  Set up an appointment with Dr. Caryl Comes as discussed.  It was a pleasure to see you today!   Preventive Care 64-103 Years Old, Male Preventive care refers to lifestyle choices and visits with your health care provider that can promote health and wellness. This includes:  A yearly physical exam. This is also called an annual well check.  Regular dental and eye exams.  Immunizations.  Screening for certain conditions.  Healthy lifestyle choices, such as eating a healthy diet, getting regular exercise, not using drugs or products that contain nicotine and tobacco, and limiting alcohol use. What can I expect for my preventive care visit? Physical exam Your health care provider will check:  Height and weight. These may be used to calculate body mass index (BMI), which is a measurement that tells if you are at a healthy weight.  Heart rate and blood pressure.  Your skin for abnormal spots. Counseling Your health care provider may ask you questions about:  Alcohol, tobacco, and drug use.  Emotional well-being.  Home and relationship well-being.  Sexual activity.  Eating habits.  Work and work Statistician. What immunizations do I need?  Influenza (flu) vaccine  This is recommended every year. Tetanus, diphtheria, and pertussis (Tdap) vaccine  You may need a Td booster every 10 years. Varicella (chickenpox) vaccine  You may need this vaccine if you have not already been vaccinated. Zoster (shingles) vaccine  You may need this after age 18. Measles, mumps, and rubella (MMR)  vaccine  You may need at least one dose of MMR if you were born in 1957 or later. You may also need a second dose. Pneumococcal conjugate (PCV13) vaccine  You may need this if you have certain conditions and were not previously vaccinated. Pneumococcal polysaccharide (PPSV23) vaccine  You may need one or two doses if you smoke cigarettes or if you have certain conditions. Meningococcal conjugate (MenACWY) vaccine  You may need this if you have certain conditions. Hepatitis A vaccine  You may need this if you have certain conditions or if you travel or work in places where you may be exposed to hepatitis A. Hepatitis B vaccine  You may need this if you have certain conditions or if you travel or work in places where you may be exposed to hepatitis B. Haemophilus influenzae type b (Hib) vaccine  You may need this if you have certain risk factors. Human papillomavirus (HPV) vaccine  If recommended by your health care provider, you may need three doses over 6 months. You may receive vaccines as individual doses or as more than one vaccine together in one shot (combination vaccines). Talk with your health care provider about the risks and benefits of combination vaccines. What tests do I need? Blood tests  Lipid and cholesterol levels. These may be checked every 5 years, or more frequently if you are over 70 years old.  Hepatitis C test.  Hepatitis B test. Screening  Lung cancer screening. You may have this screening every year starting at age 64 if you have a 30-pack-year history of smoking and currently smoke or have quit within the past 15 years.  Prostate cancer screening. Recommendations will vary depending on your family history and other risks.  Colorectal cancer screening. All adults should have this screening starting at age 64 and continuing until age 34. Your health care provider may recommend screening at age 64 if you are at increased risk. You will have tests every  1-10 years, depending on your results and the type of screening test.  Diabetes screening. This is done by checking your blood sugar (glucose) after you have not eaten for a while (fasting). You may have this done every 1-3 years.  Sexually transmitted disease (STD) testing. Follow these instructions at home: Eating and drinking  Eat a diet that includes fresh fruits and vegetables, whole grains, lean protein, and low-fat dairy products.  Take vitamin and mineral supplements as recommended by your health care provider.  Do not drink alcohol if your health care provider tells you not to drink.  If you drink alcohol: ? Limit how much you have to 0-2 drinks a day. ? Be aware of how much alcohol is in your drink. In the [place] one drink equals one 12 oz bottle of beer (355 mL), one 5 oz glass of wine (148 mL), or one 1 oz glass of hard liquor (44 mL). Lifestyle  Take daily care of your teeth and gums.  Stay active. Exercise for at least 30 minutes on 5 or more days each week.  Do not use any products that contain nicotine or tobacco, such as cigarettes, e-cigarettes, and chewing tobacco. If you need help quitting, ask your health care provider.  If you are sexually active, practice safe sex. Use a condom or other form of protection to prevent STIs (sexually transmitted infections).  Talk with your health care provider about taking a low-dose aspirin every day starting at age 64. What's next?  Go to your health care provider once a year for a well check visit.  Ask your health care provider how often you should have your eyes and teeth checked.  Stay up to date on all vaccines. This information is not intended to replace advice given to you by your health care provider. Make sure you discuss any questions you have with your health care provider. Document Revised: 01/05/2018 Document Reviewed: 01/05/2018 Elsevier Patient Education  2020 [employer].

## 2020-01-11 NOTE — Assessment & Plan Note (Signed)
Compliant to atorvastatin 20 mg, continue same.  Repeat lipids pending. 

## 2020-01-11 NOTE — Progress Notes (Signed)
Subjective:    Patient ID: Brandon Lopez, male    DOB: November 15, 1955, 64 y.o.   MRN: [id number]  HPI  This visit occurred during the SARS-CoV-2 public health emergency.  Safety protocols were in place, including screening questions prior to the visit, additional usage of staff PPE, and extensive cleaning of exam room while observing appropriate contact time as indicated for disinfecting solutions.   Brandon Lopez is a 64 year old male who presents today for complete physical.  Immunizations: -Tetanus: Completed in 2013 -Influenza: Due -Shingles: Completed Zostavax -Pneumonia: Completed Pneumovax in 2016 -Covid-19: Has not completed  Diet: He endorses a fair diet, doesn't eat much. He continues to drink alcohol daily. Exercise: He is not exercising. He is active at home.   Eye exam: No recent exam Dental exam: Scheduled  Colonoscopy: Completed in 2016, due in 2026 PSA: Due Hep C Screen: Negative Lung Cancer Screening: Completes annually, last one in 04/2019  BP Readings from Last 3 Encounters:  01/11/20 134/80  11/08/19 105/84  08/17/19 124/82   Wt Readings from Last 3 Encounters:  01/11/20 191 lb (86.6 kg)  08/17/19 191 lb (86.6 kg)  06/19/19 187 lb 12.8 oz (85.2 kg)     Review of Systems  Constitutional: Negative for unexpected weight change.  HENT: Negative for rhinorrhea.   Eyes: Negative for visual disturbance.  Respiratory: Negative for cough and shortness of breath.   Cardiovascular: Negative for chest pain.  Gastrointestinal: Negative for constipation and diarrhea.  Genitourinary: Negative for difficulty urinating.  Musculoskeletal: Negative for arthralgias and myalgias.  Skin: Negative for rash.  Allergic/Immunologic: Negative for environmental allergies.  Neurological: Negative for numbness and headaches.       Occasional dizziness  Psychiatric/Behavioral: The patient is not nervous/anxious.        Past Medical History:  Diagnosis Date    Arthritis    Chronic systolic CHF (congestive heart failure) ([place])    a. echo 6/16: EF 55-60%, Gr1DD, LA nl, PASP nl; b. echo 7/19: EF 40-45%, unable to exclude RWMA, Gr1DD, LA nl, RVSF nl, PASP nl   COPD (chronic obstructive pulmonary disease) ([facility])    Depression    Diverticulosis    Found during colonoscopy   Hypertension    Kidney stones    PAF (paroxysmal atrial fibrillation) ([place])    a. short episodes previously noted on PPM interrogation; b. started on Xarelto 07/2017 given further episodes of Afib noted during interrogation; c. 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 Biotronik; Dayna Barker DR-T [id number]; placed 08/2012   Sleep apnea    Symptomatic bradycardia    a. s/p Biotronik; Dayna Barker DR-T [id number]; placed 08/2012 at [place] History   Socioeconomic History   Marital status: Single    Spouse name: Not on file   Number of children: Not on file   Years of education: Not on file   Highest education level: Not on file  Occupational History   Not on file  Tobacco Use   Smoking status: Heavy Tobacco Smoker    Packs/day: 2.00    Years: 50.00    Pack years: 100.00    Types: Cigarettes    Start date: 1964   Smokeless tobacco: Never Used   Tobacco comment: started smoking at age 8, 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  Vaping Use   Vaping Use: Never used  Substance and Sexual Activity   Alcohol use: Yes    Alcohol/week: 8.0 - 14.0 standard drinks    Types: 8 - 14 Cans of beer per week   Drug use: Yes    Types: Marijuana   Sexual activity: Not on file  Other Topics Concern   Not on file  Social History Narrative   Not on file   Social Determinants of Health   Financial Resource Strain: Low Risk    Difficulty of Paying Living Expenses: Not very hard  Food Insecurity: Not on file  Transportation Needs: Not on file  Physical  Activity: Not on file  Stress: Not on file  Social Connections: Not on file  Intimate Partner Violence: Not on file    Past Surgical History:  Procedure Laterality Date   APPENDECTOMY  age 82   CARDIAC CATHETERIZATION     7 years ago   COLONOSCOPY N/A 05/27/2014   Procedure: COLONOSCOPY;  Surgeon: Hulen Luster, MD;  [facility] END[facility]COPY;  Service: Gastroenterology;  Laterality: N/A;   INSERT / REPLACE / REMOVE PACEMAKER     3 years ago    Family History  Problem Relation Age of Onset   Arthritis Mother    Heart disease Father    Hyperlipidemia Father    Hypertension Father    Cancer Paternal Grandfather     Allergies  Allergen Reactions   Simvastatin Diarrhea   Lisinopril Cough    Current Outpatient Medications on File Prior to Visit  Medication Sig Dispense Refill   albuterol (PROAIR HFA) 108 (90 Base) MCG/ACT inhaler Inhale 2 puffs into the lungs every 6 (six) hours as needed for wheezing or shortness of breath. 18 g 2   atorvastatin (LIPITOR) 20 MG tablet Take 1 tablet by mouth in the evening 90 tablet 0   carvedilol (COREG) 3.125 MG tablet Take 1 tablet (3.125 mg total) by mouth 2 (two) times daily. 180 tablet 3   ezetimibe (ZETIA) 10 MG tablet Take 1 tablet (10 mg total) by mouth daily. 30 tablet 8   loratadine (CLARITIN) 10 MG tablet Take 10 mg by mouth daily as needed for allergies.      SYMBICORT 160-4.5 MCG/ACT inhaler Inhale 2 puffs by mouth twice daily 33 g 1   cyclobenzaprine (FLEXERIL) 5 MG tablet Take 1 tablet (5 mg total) by mouth 3 (three) times daily as needed for muscle spasms. (Patient not taking: Reported on 01/11/2020) 15 tablet 0   No current facility-administered medications on file prior to visit.    BP 134/80    Pulse 68    Temp (!) 97.1 F (36.2 C) (Temporal)    Ht 5\' 5"  (1.651 m)    Wt 191 lb (86.6 kg)    SpO2 96%    BMI 31.78 kg/m    Objective:   Physical Exam Constitutional:      Appearance: He is well-nourished.   HENT:     Right Ear: Tympanic membrane and ear canal normal.     Left Ear: Tympanic membrane and ear canal normal.     Mouth/Throat:     Mouth: Oropharynx is clear and moist.  Eyes:     Extraocular Movements: EOM normal.     Pupils: Pupils are equal, round, and reactive to light.  Cardiovascular:     Rate and Rhythm: Normal rate and regular rhythm.  Pulmonary:     Effort: Pulmonary effort is normal.     Breath sounds: Normal breath sounds.  Abdominal:  General: Bowel sounds are normal.     Palpations: Abdomen is soft.     Tenderness: There is no abdominal tenderness.  Musculoskeletal:        General: Normal range of motion.     Cervical back: Neck supple.  Skin:    General: Skin is warm and dry.  Neurological:     Mental Status: He is alert and oriented to person, place, and time.     Cranial Nerves: No cranial nerve deficit.     Deep Tendon Reflexes:     Reflex Scores:      Patellar reflexes are 2+ on the right side and 2+ on the left side. Psychiatric:        Mood and Affect: Mood and affect and mood normal.            Assessment & Plan:

## 2020-01-11 NOTE — Assessment & Plan Note (Signed)
Rate and rhythm regular.  Pacemaker in place. Follows with cardiology and electrophysiology.

## 2020-01-11 NOTE — Assessment & Plan Note (Signed)
Chronic, continued and is now mostly drinking liquor (1/2 pint per day). Strongly advised he stop drinking alcohol.

## 2020-01-13 ENCOUNTER — Other Ambulatory Visit: Payer: Self-pay | Admitting: Internal Medicine

## 2020-01-14 NOTE — Telephone Encounter (Signed)
Please schedule overdue F/U appointment with Dr. Caryl Comes. Thank you!

## 2020-01-14 NOTE — Telephone Encounter (Signed)
Scheduled both recalls. [facility] first available was 4/5

## 2020-01-15 ENCOUNTER — Telehealth: Payer: Self-pay

## 2020-01-15 NOTE — Chronic Care Management (AMB) (Addendum)
Chronic Care Management Pharmacy Assistant   Name: Brandon Lopez  MRN: [id number] DOB: 02-02-1955  Reason for Encounter: HTN Review  Patient Questions:  1.  Have you seen any other providers since your last visit? Yes 03/14/19 Vernona Rieger NP- PCP   2.  Any changes in your medicines or health? Yes 08/17/19, Vernona Rieger added short course of cyclobenzaprine for low back pain  PCP : Doreene Nest, NP  Allergies:   Allergies  Allergen Reactions   Simvastatin Diarrhea   Lisinopril Cough    Medications: Outpatient Encounter Medications as of 01/15/2020  Medication Sig   albuterol (PROAIR HFA) 108 (90 Base) MCG/ACT inhaler Inhale 2 puffs into the lungs every 6 (six) hours as needed for wheezing or shortness of breath.   atorvastatin (LIPITOR) 20 MG tablet Take 1 tablet by mouth in the evening   carvedilol (COREG) 3.125 MG tablet Take 1 tablet by mouth twice daily   cyclobenzaprine (FLEXERIL) 5 MG tablet Take 1 tablet (5 mg total) by mouth 3 (three) times daily as needed for muscle spasms. (Patient not taking: Reported on 01/11/2020)   ezetimibe (ZETIA) 10 MG tablet Take 1 tablet (10 mg total) by mouth daily.   loratadine (CLARITIN) 10 MG tablet Take 10 mg by mouth daily as needed for allergies.    SYMBICORT 160-4.5 MCG/ACT inhaler Inhale 2 puffs by mouth twice daily   telmisartan-hydrochlorothiazide (MICARDIS HCT) 40-12.5 MG tablet Take 1 tablet by mouth daily. For blood pressure.   No facility-administered encounter medications on file as of 01/15/2020.    Current Diagnosis: Patient Active Problem List   Diagnosis Date Noted   Chronic back pain 08/17/2019   Chronic shoulder pain 12/12/2018   Rectal bleeding 09/19/2018   Paroxysmal atrial fibrillation (HCC) 10/31/2017   Syncope 08/10/2017   Osteoarthritis 03/17/2017   Edema 07/21/2016   Cigarette smoker 11/17/2015   Diverticulosis 12/16/2014   Chronic obstructive pulmonary disease (HCC) 11/26/2014    Cervical pain (neck) 10/14/2014   Hyperlipidemia 07/11/2014   Preventative health care 07/11/2014   Pacemaker 06/03/2014   Anxiety and depression 06/03/2014   Alcohol abuse 06/03/2014   OSA (obstructive sleep apnea) 06/03/2014   Essential hypertension 06/03/2014   Tobacco abuse 06/03/2014   Reviewed chart prior to disease state call. Spoke with patient regarding BP  Recent Office Vitals: BP Readings from Last 3 Encounters:  01/11/20 134/80  11/08/19 105/84  08/17/19 124/82   Pulse Readings from Last 3 Encounters:  01/11/20 68  11/08/19 74  08/17/19 62    Wt Readings from Last 3 Encounters:  01/11/20 191 lb (86.6 kg)  08/17/19 191 lb (86.6 kg)  06/19/19 187 lb 12.8 oz (85.2 kg)     Kidney Function Lab Results  Component Value Date/Time   CREATININE 0.76 01/11/2020 12:27 PM   CREATININE 0.75 12/12/2018 10:30 AM   CREATININE 0.75 02/13/2014 04:35 AM   CREATININE 0.74 02/12/2014 01:54 PM   GFR 95.10 01/11/2020 12:27 PM   GFRNONAA >60 08/09/2017 08:38 PM   GFRNONAA >60 02/13/2014 04:35 AM   GFRAA >60 08/09/2017 08:38 PM   GFRAA >60 02/13/2014 04:35 AM    BMP Latest Ref Rng & Units 01/11/2020 12/12/2018 12/06/2017  Glucose 70 - 99 mg/dL 92 85 82  BUN 6 - 23 mg/dL 9 11 10   Creatinine 0.40 - 1.50 mg/dL 5.00 9.38  Sodium 135 - 145 mEq/L 134(L) 137 139  Potassium 3.5 - 5.1 mEq/L 3.6 4.3 3.9  Chloride 96 - 112  mEq/L 100 103 102  CO2 19 - 32 mEq/L 27 26 30   Calcium 8.4 - 10.5 mg/dL 9.1 9.2 9.2   Current antihypertensive regimen:  Carvedilol 3.125 mg - 1 tablet twice daily Telmisartan-HCTZ 40-12.5 mg - 1 tablet daily    How often are you checking your Blood Pressure? Once daily when he remembers  Current home BP readings:  01/08/2020 - 122/81 12:05pm 01/10/2020 - 134/82 5:30pm 01/11/2020 - 143/96 9:15am 01/12/2020 - 134/80 11:40am 01/13/2020 - 122/89 2:55pm   What recent interventions/DTPs have been made by any provider to improve Blood Pressure control since  last CPP Visit:  Checking BP monthly and documenting. Ensuring salt intake is below 2300mg /day   Any recent hospitalizations or ED visits since last visit with CPP? No   What diet changes have been made to improve Blood Pressure Control? Working on eating healthier, low salt diet  What exercise is being done to improve your Blood Pressure Control?  No changes states he stays busy around the house such as cutting wood outside.    Adherence Review: Is the patient currently on ACE/ARB medication? Yes Is patient currently on a STATIN medication? Yes Does the patient have > 5 day gap between last estimated fill dates for [facility] medications? No    Asked about adherence on atorvastatin, patient states he has not missed a dose and had it filled a few weeks ago. He states he "takes his medications religiously".    Patient states he has had a lot going on recently and is finding it difficult to remember to check blood pressure.   Follow-Up:  Pharmacist Review   [name], CPP notified  [name], [place] Assistant [phone]  I have reviewed the care management and care coordination activities outlined in this encounter and I am certifying that I agree with the content of this note. BP is within goal of < 140/90 mmHg at home. Confirms adherence to statin. No further action required.  [name], PharmD Clinical Pharmacist [facility] [phone]

## 2020-02-22 ENCOUNTER — Other Ambulatory Visit: Payer: Self-pay | Admitting: Cardiovascular Disease

## 2020-02-24 NOTE — Progress Notes (Unsigned)
Cardiology Office Note  Date:  02/25/2020   ID:  Brandon Lopez, DOB 1955/06/08, MRN [id number]  PCP:  Pleas Koch, NP   Chief Complaint  Patient presents with  . Follow-up    Annual follow up. Medications verbally reviewed with patient.     HPI:  Brandon Lopez is a 65 y.o. male past medical history of Cardiac cath >10 yr ago, 20% somewhere COPD, smoker 2 ppd Hyperlipidemia pacemaker implanted at [facility] for dyspnea and presyncope.  Afib, but duration < 12 hrs, started on anticoagulation at last hospitalization  Pacer, followed by Dr. Caryl Comes Who presents for f/u of his chest pain, atrial fibrillation, shortness of breath  Had covid sept 2021 Doing better  Smoking 2 ppd Previously tried Chantix  Lab work reviewed with him HGBA1C 5.6 Total chiol 137, LDL 63  Pacemaker followed by Dr. Therisa Doyne downloads reviewed afib 1%  Nov 2021 Was 2% in 08/2019  CHADS VASC 1 Not on anticoagulation  No exercise program but likes to go panning for gold, Also works on house remodel Denies any chest pain or shortness of breath on exertion No recent COPD exacerbation  Denies any tachycardia concerning for atrial fibrillation  EKG personally reviewed by myself on todays visit Shows paced rhythm rate 75 bpm  no significant ST-T wave changes  Echocardiogram July 2019 - Left ventricle: The cavity size was normal. Systolic function was   mildly to moderately reduced. The estimated ejection fraction was   in the range of 40% to 45%. Regional wall motion abnormalities   cannot be excluded. Doppler parameters are consistent with   abnormal left ventricular relaxation (grade 1 diastolic   dysfunction). - Left atrium: The atrium was normal in size. - Right ventricle: Pacer wire or catheter noted in right ventricle.   Systolic function was normal. - Pulmonary arteries: Systolic pressure was within the normal   range.  Stress test August 2019 reviewed with  him Pharmacological myocardial perfusion imaging study with no significant  Ischemia Low risk scan  CT scan abdomen  moderate aortic atherosclerosis, iliac artery disease, SFA disease  Other past medical history reviewed Hospitalized 7/19 for an unusual episode where he had some abdominal discomfort tingling in his chest and not feeling right.  EMS was called.  Notes not available.  There was a comment about unresponsive and cyanotic.  The day before he had had atrial fibrillation for about 6 hours.    PMH:   has a past medical history of Arthritis, Chronic systolic CHF (congestive heart failure) ([place]), COPD (chronic obstructive pulmonary disease) (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  PSH:    Past Surgical History:  Procedure Laterality Date  . APPENDECTOMY  age 3  . CARDIAC CATHETERIZATION     7 years ago  . COLONOSCOPY N/A 05/27/2014   Procedure: COLONOSCOPY;  Surgeon: Hulen Luster, MD;  Location: [facility] ENDOSCOPY;  Service: Gastroenterology;  Laterality: N/A;  . INSERT / REPLACE / REMOVE PACEMAKER     3 years ago    Current Outpatient Medications  Medication Sig Dispense Refill  . albuterol (PROAIR HFA) 108 (90 Base) MCG/ACT inhaler Inhale 2 puffs into the lungs every 6 (six) hours as needed for wheezing or shortness of breath. 18 g 2  . atorvastatin (LIPITOR) 20 MG tablet Take 1 tablet by mouth in the evening 90 tablet 0  . carvedilol (COREG)  3.125 MG tablet Take 1 tablet by mouth twice daily 180 tablet 0  . ezetimibe (ZETIA) 10 MG tablet Take 1 tablet (10 mg total) by mouth daily. 30 tablet 8  . loratadine (CLARITIN) 10 MG tablet Take 10 mg by mouth daily as needed for allergies.     . SYMBICORT 160-4.5 MCG/ACT inhaler Inhale 2 puffs by mouth twice daily 33 g 1  . telmisartan-hydrochlorothiazide (MICARDIS  HCT) 40-12.5 MG tablet Take 1 tablet by mouth daily. For blood pressure. 90 tablet 0   No current facility-administered medications for this visit.     Allergies:   Simvastatin and Lisinopril   Social History:  The patient  reports that he has been smoking cigarettes. He started smoking about 58 years ago. He has a 100.00 pack-year smoking history. He has never used smokeless tobacco. He reports current alcohol use of about 8.0 - 14.0 standard drinks of alcohol per week. He reports current drug use. Drug: Marijuana.   Family History:   family history includes Arthritis in his mother; Cancer in his paternal grandfather; Heart disease in his father; Hyperlipidemia in his father; Hypertension in his father.    Review of Systems: Review of Systems  Constitutional: Negative.   Respiratory: Positive for cough and shortness of breath.   Cardiovascular: Negative.   Gastrointestinal: Negative.   Musculoskeletal: Negative.   Neurological: Negative.   Psychiatric/Behavioral: Negative.   All other systems reviewed and are negative.  PHYSICAL EXAM: VS:  BP 126/82 (BP Location: Left Arm, Patient Position: Sitting, Cuff Size: Normal)   Pulse 75   Ht 5\' 5"  (1.651 m)   Wt 188 lb (85.3 kg)   SpO2 97%   BMI 31.28 kg/m  , BMI Body mass index is 31.28 kg/m. Constitutional:  oriented to person, place, and time. No distress.  HENT:  Head: Grossly normal Eyes:  no discharge. No scleral icterus.  Neck: No JVD, no carotid bruits  Cardiovascular: Regular rate and rhythm, no murmurs appreciated Pulmonary/Chest: Clear to auscultation bilaterally, no wheezes or rails Abdominal: Soft.  no distension.  no tenderness.  Musculoskeletal: Normal range of motion Neurological:  normal muscle tone. Coordination normal. No atrophy Skin: Skin warm and dry Psychiatric: normal affect, pleasant  Recent Labs: 01/11/2020: ALT 28; BUN 9; Creatinine, Ser 0.76; Hemoglobin 15.8; Platelets 227.0; Potassium 3.6; Sodium  134    Lipid Panel Lab Results  Component Value Date   CHOL 137 01/11/2020   HDL 44.80 01/11/2020   LDLCALC 63 01/11/2020   TRIG 147.0 01/11/2020    Wt Readings from Last 3 Encounters:  02/25/20 188 lb (85.3 kg)  01/11/20 191 lb (86.6 kg)  08/17/19 191 lb (86.6 kg)     ASSESSMENT AND PLAN:  Chronic obstructive pulmonary disease, unspecified COPD type ([place]) We have encouraged him to continue to work on weaning his cigarettes and smoking cessation. He will continue to work on this and does not want any assistance with chantix.  Again discussed with him at length  Paroxysmal atrial fibrillation (HCC)  Followed by Dr. Caryl Comes, denies any tachycardia or palpitations Pacemaker downloads reviewed  1 to 2% atrial fibrillation burden CHADS VASC 1, discussed with him in detail Would be 2 if you consider his coronary calcification  CAD, Coronary calcium noted on prior scans On lipitor and zetia,  Numbers at goal Recommended smoking cessation Unable to tolerate higher dose Lipitor  Essential hypertension .Blood pressure is well controlled on today's visit. No changes made to the medications.  Pacemaker Followed by  [facility]  Edema, unspecified type No significant edema  Cigarette smoker Long discussion, Does not want Chantix, Discussed other modalities  Shortness of breath Myoview: 08/2017: low risk Symptoms stable, still smoking    Total encounter time more than 25 minutes  Greater than 50% was spent in counseling and coordination of care with the patient   No orders of the defined types were placed in this encounter.    Signed, Esmond Plants, M.D., Ph.D. 02/25/2020  [place], [place]

## 2020-02-25 ENCOUNTER — Other Ambulatory Visit: Payer: Self-pay

## 2020-02-25 ENCOUNTER — Other Ambulatory Visit: Payer: Self-pay | Admitting: Primary Care

## 2020-02-25 ENCOUNTER — Ambulatory Visit: Payer: PPO | Admitting: Cardiovascular Disease

## 2020-02-25 ENCOUNTER — Encounter: Payer: Self-pay | Admitting: Cardiovascular Disease

## 2020-02-25 VITALS — BP 126/82 | HR 75 | Ht 65.0 in | Wt 188.0 lb

## 2020-02-25 DIAGNOSIS — Z950 Presence of cardiac pacemaker: Secondary | ICD-10-CM | POA: Diagnosis not present

## 2020-02-25 DIAGNOSIS — I495 Sick sinus syndrome: Secondary | ICD-10-CM

## 2020-02-25 DIAGNOSIS — I700 Atherosclerosis of aorta: Secondary | ICD-10-CM | POA: Diagnosis not present

## 2020-02-25 DIAGNOSIS — I5022 Chronic systolic (congestive) heart failure: Secondary | ICD-10-CM

## 2020-02-25 DIAGNOSIS — J449 Chronic obstructive pulmonary disease, unspecified: Secondary | ICD-10-CM

## 2020-02-25 DIAGNOSIS — I480 Paroxysmal atrial fibrillation: Secondary | ICD-10-CM | POA: Diagnosis not present

## 2020-02-25 NOTE — Patient Instructions (Signed)
Medication Instructions:  No changes  If you need a refill on your cardiac medications before your next appointment, please call your pharmacy.    Lab work: No new labs needed   If you have labs (blood work) drawn today and your tests are completely normal, you will receive your results only by: . MyChart Message (if you have MyChart) OR . A paper copy in the mail If you have any lab test that is abnormal or we need to change your treatment, we will call you to review the results.   Testing/Procedures: No new testing needed   Follow-Up: At [facility], you and your health needs are our priority.  As part of our continuing mission to provide you with exceptional heart care, we have created designated Provider Care Teams.  These Care Teams include your primary Cardiologist (physician) and Advanced Practice Providers (APPs -  Physician Assistants and Nurse Practitioners) who all work together to provide you with the care you need, when you need it.  . You will need a follow up appointment in 12 months  . Providers on your designated Care Team:   . Christopher Berge, NP . Ryan Dunn, PA-C . Jacquelyn Visser, PA-C  Any Other Special Instructions Will Be Listed Below (If Applicable).  COVID-19 Vaccine Information can be found at: [email or web address] For questions related to vaccine distribution or appointments, please email [email or web address] or call [phone].     

## 2020-02-25 NOTE — Telephone Encounter (Signed)
Refills sent to pharmacy. 

## 2020-03-11 ENCOUNTER — Ambulatory Visit (INDEPENDENT_AMBULATORY_CARE_PROVIDER_SITE_OTHER): Payer: PPO

## 2020-03-11 DIAGNOSIS — I495 Sick sinus syndrome: Secondary | ICD-10-CM | POA: Diagnosis not present

## 2020-03-11 LAB — CUP PACEART REMOTE DEVICE CHECK
Date Time Interrogation Session: 20220214165247
Implantable Lead Implant Date: 20140801
Implantable Lead Implant Date: 20140801
Implantable Lead Location: 753859
Implantable Lead Location: 753860
Implantable Lead Model: 362
Implantable Lead Serial Number: 29356730
Implantable Lead Serial Number: 29392661
Implantable Pulse Generator Implant Date: 20140801
Pulse Gen Serial Number: 68053398

## 2020-03-17 NOTE — Progress Notes (Signed)
Remote pacemaker transmission.   

## 2020-03-21 ENCOUNTER — Other Ambulatory Visit: Payer: Self-pay | Admitting: Primary Care

## 2020-03-21 DIAGNOSIS — I10 Essential (primary) hypertension: Secondary | ICD-10-CM

## 2020-03-21 NOTE — Telephone Encounter (Signed)
At cpe only gave 90 day refill did not see note as to why or if you wanted him to follow up in 3 mo. Ok to refill?

## 2020-03-21 NOTE — Telephone Encounter (Signed)
Refills sent to pharmacy. 

## 2020-03-26 ENCOUNTER — Other Ambulatory Visit: Payer: Self-pay | Admitting: Primary Care

## 2020-03-26 DIAGNOSIS — E785 Hyperlipidemia, unspecified: Secondary | ICD-10-CM

## 2020-04-05 IMAGING — CT CT CHEST LUNG CANCER SCREENING LOW DOSE W/O CM
2 of 5 series · 15 of 40 positions shown, 18 images · non-contrast
Comparison: Low-dose lung cancer screening CT chest dated
01/04/2018

CLINICAL DATA: 63-year-old male current smoker, with 100 pack-year
history of smoking, for follow-up lung cancer screening

EXAM:
CT CHEST WITHOUT CONTRAST LOW-DOSE FOR LUNG CANCER SCREENING
TECHNIQUE: Multidetector CT imaging of the chest was performed following the
standard protocol without IV contrast.

[Series 3: lung 1.00 · axial · 0.79mm/px · z∈[-1205,-904]mm · 12 of 333 slices shown, 15 images]
[im 16/333  mediastinal]
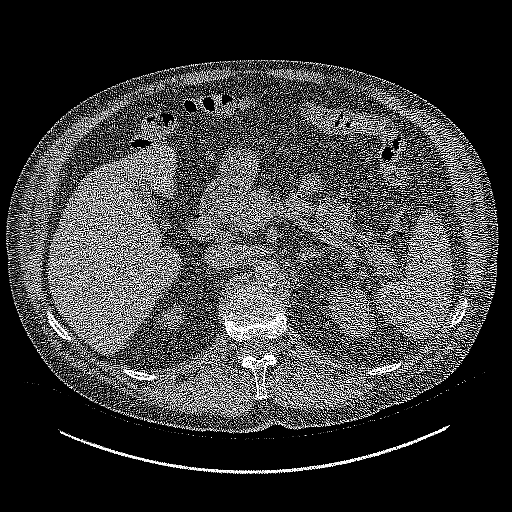
[im 16/333  lung]
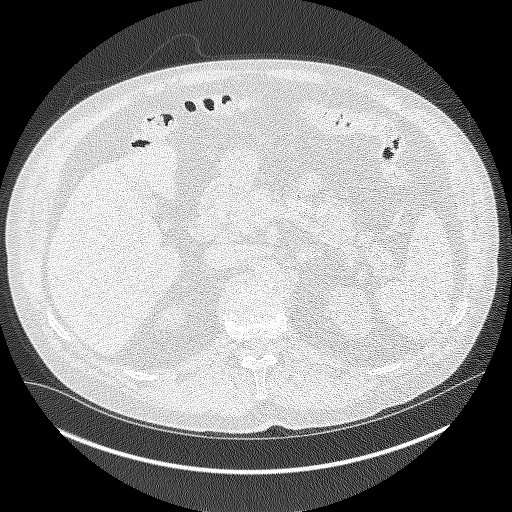
[im 48/333  lung]
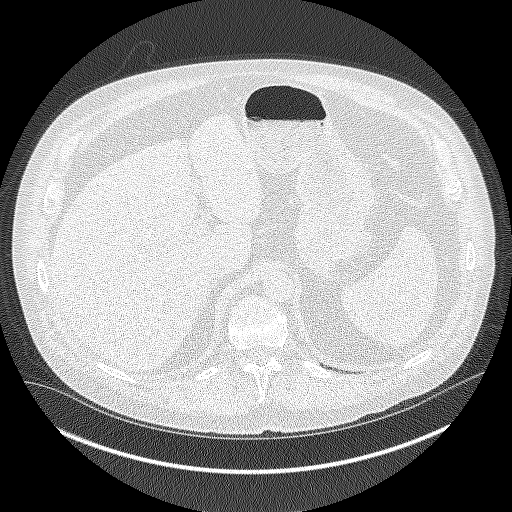
[im 80/333  lung]
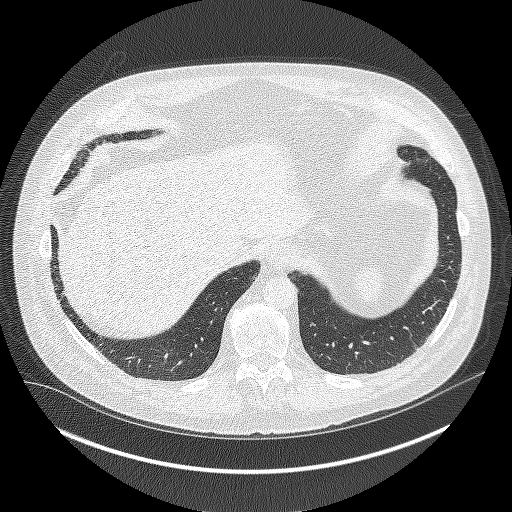
[im 95/333  lung]
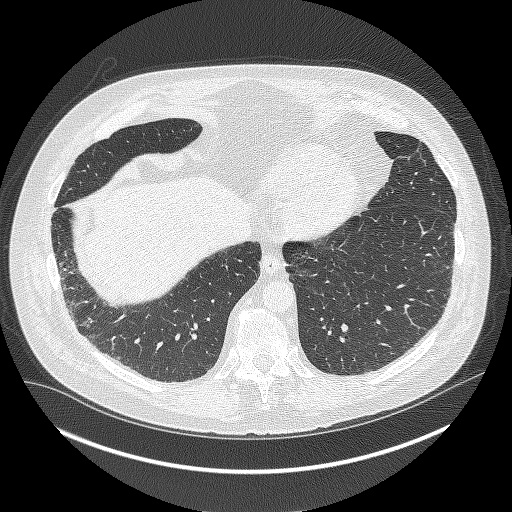
[im 127/333  mediastinal]
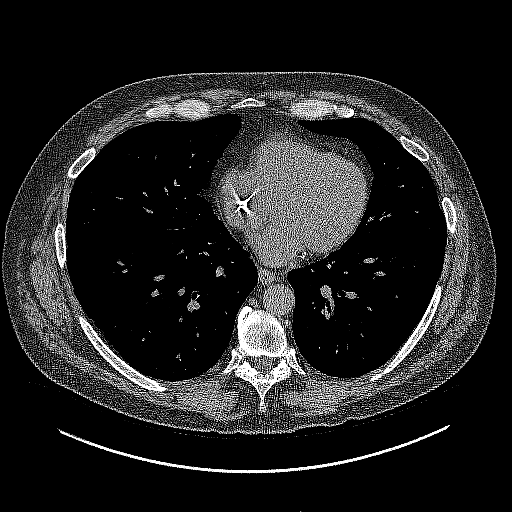
[im 127/333  lung]
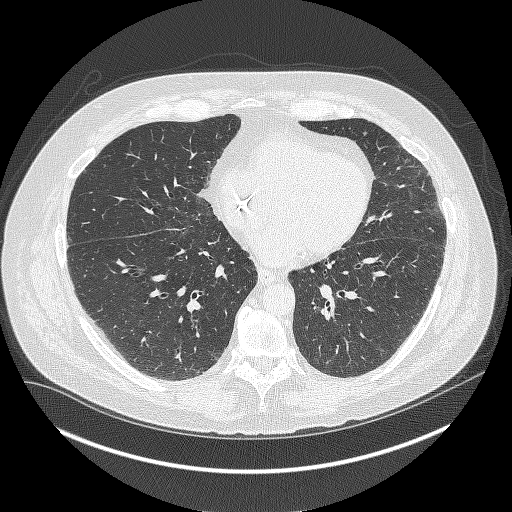
[im 159/333  lung]
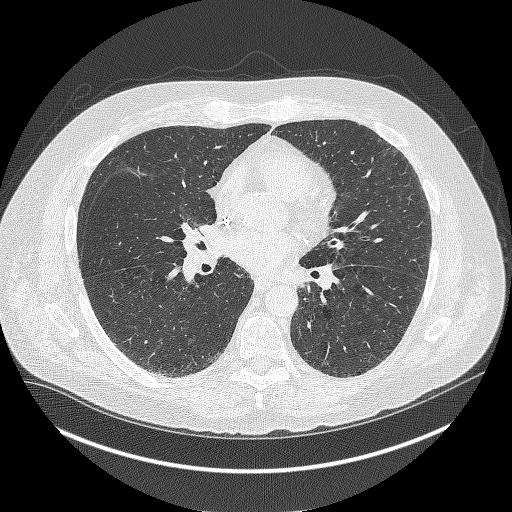
[im 174/333  lung]
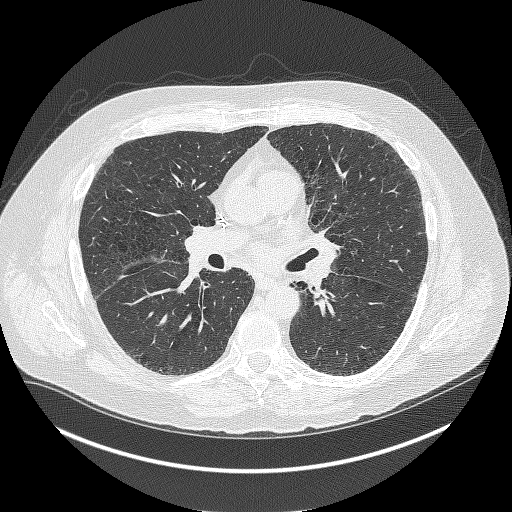
[im 206/333  lung]
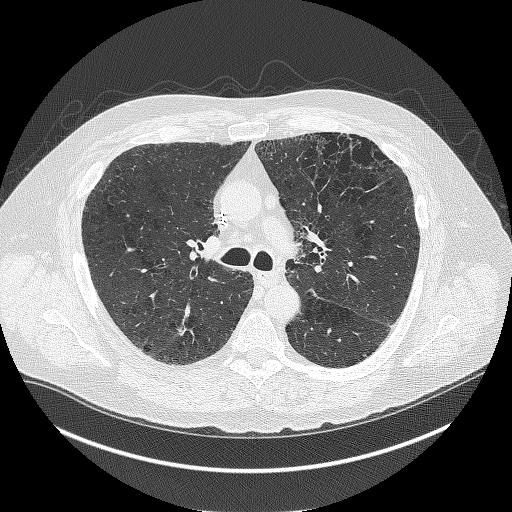
[im 238/333  mediastinal]
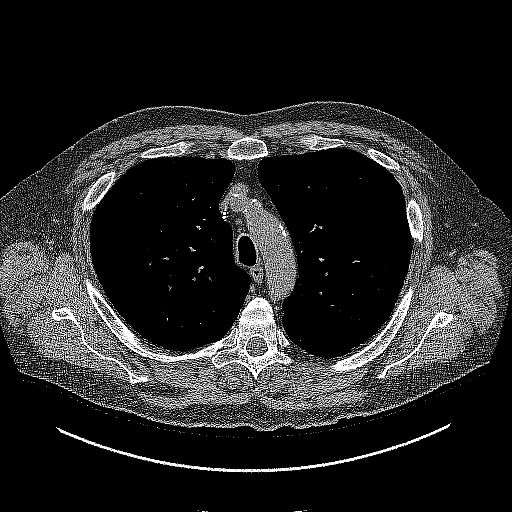
[im 238/333  lung]
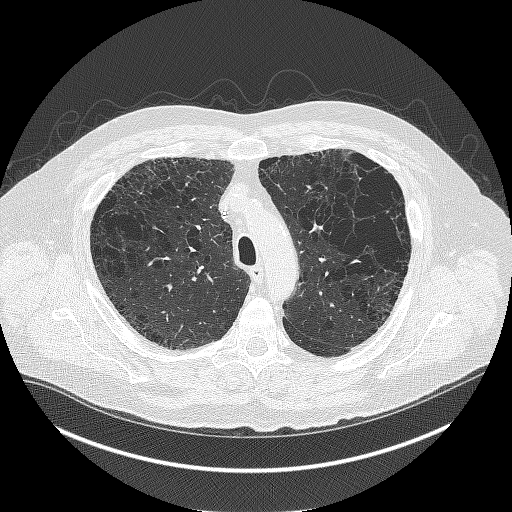
[im 253/333  lung]
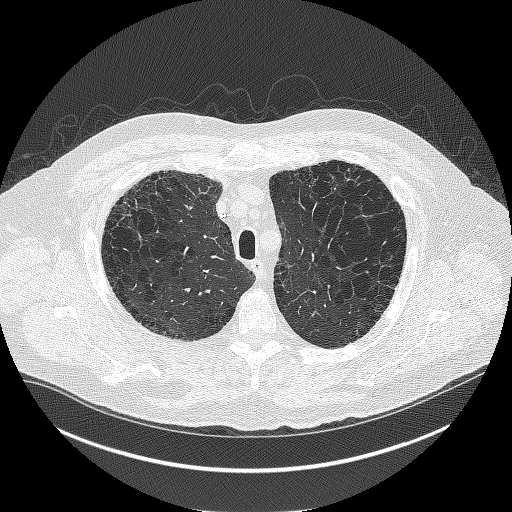
[im 285/333  lung]
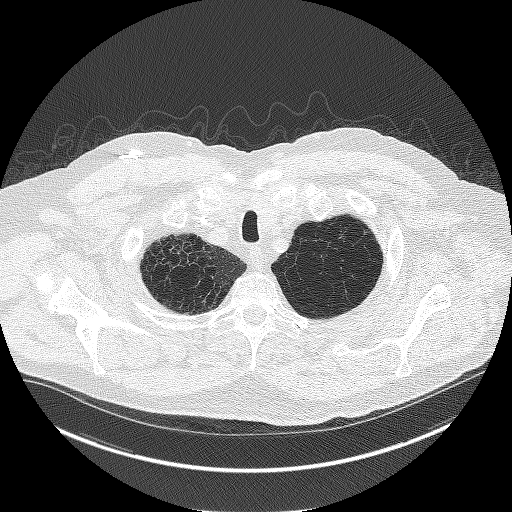
[im 317/333  lung]
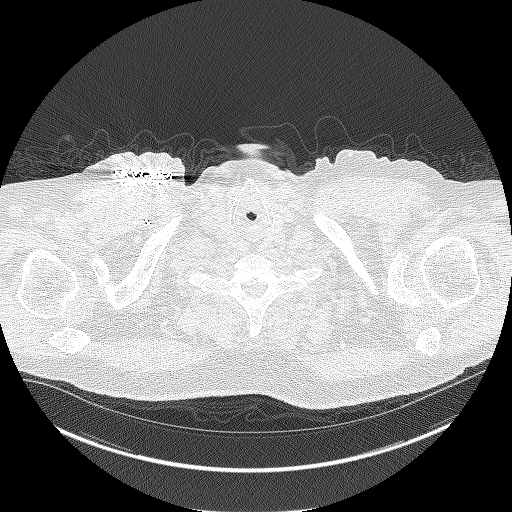

[Series 4: coronals lung 1.00 cor · coronal · 0.65mm/px · 3 of 397 slices shown]
[im 80/397  lung]
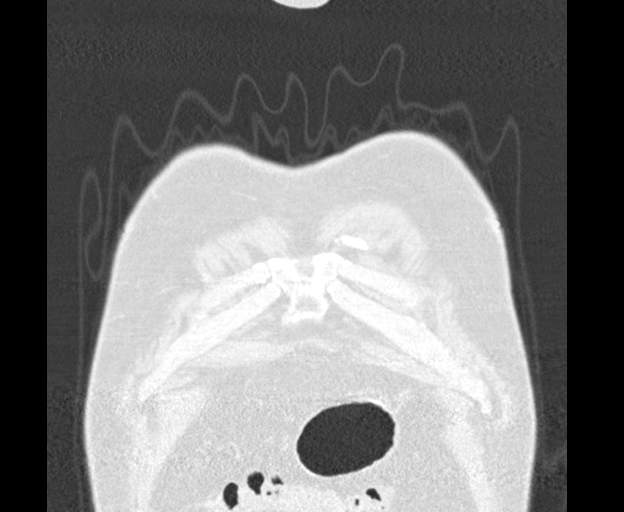
[im 159/397  lung]
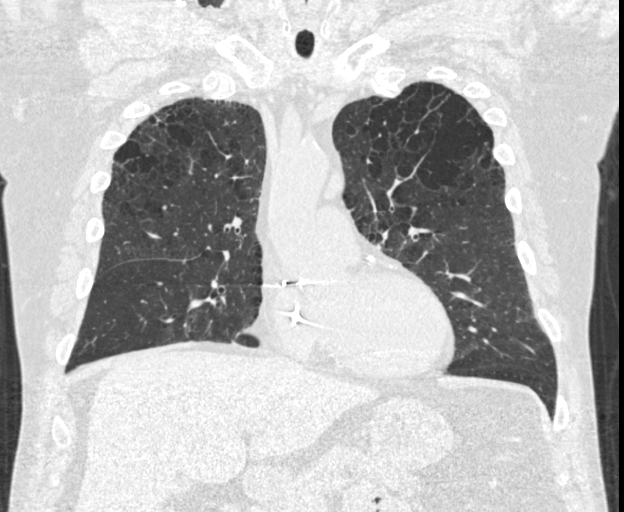
[im 238/397  lung]
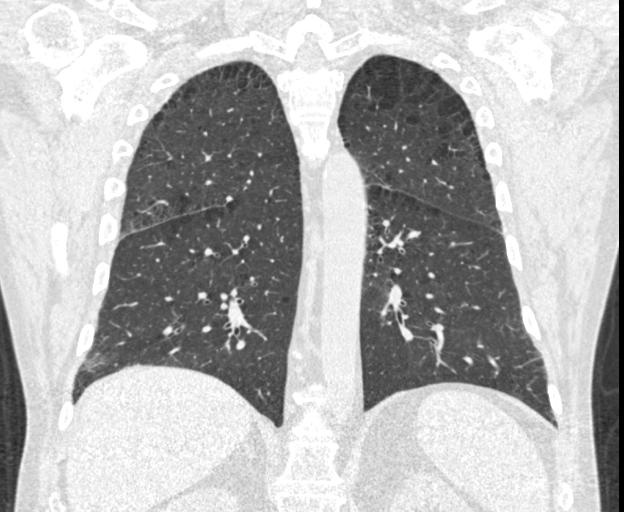

[15 of 40 positions shown; findings below may reference images not displayed]

FINDINGS: Cardiovascular: The heart is normal in size. No pericardial
effusion.

No evidence of thoracic aortic aneurysm. Atherosclerotic
calcifications of the aortic arch.

3 vessel coronary atherosclerosis.

Right subclavian pacemaker.

Mediastinum/Nodes: Small mediastinal lymph nodes, including a 9 mm
short axis subcarinal node and an 8 mm short axis prevascular node,
within normal limits.

Visualized thyroid is unremarkable.

Lungs/Pleura: Moderate centrilobular and paraseptal emphysematous
changes, upper lung predominant.

Bronchiectasis with bronchial wall thickening in the left upper
lobe, right middle lobe, and bilateral lower lobes.

Mild subpleural reticulation/fibrosis in the lungs bilaterally,
lower lobe predominant.

No focal consolidation.

Small calcified granulomas in the left lung, measuring up to 1.3 mm.

No pleural effusion or pneumothorax.

Upper Abdomen: Visualized upper abdomen is grossly unremarkable,
noting vascular calcifications.

Musculoskeletal: Degenerative changes of the visualized
thoracolumbar spine.
IMPRESSION: Lung-RADS 1, negative. Continue annual screening with low-dose chest
CT without contrast in 12 months.

Aortic Atherosclerosis (BDS0D-7PG.G) and Emphysema (BDS0D-EP0.X).

## 2020-04-08 ENCOUNTER — Telehealth: Payer: Self-pay | Admitting: *Deleted

## 2020-04-08 DIAGNOSIS — Z720 Tobacco use: Secondary | ICD-10-CM

## 2020-04-08 MED ORDER — CHANTIX STARTING MONTH PAK 0.5 MG X 11 & 1 MG X 42 PO TABS: ORAL_TABLET | ORAL | 0 refills | Status: DC

## 2020-04-08 NOTE — Telephone Encounter (Signed)
Okay to start Chantix. Will send starter pack to his pharmacy. Please instruct him to notify me once he's nearing the end of his starter pack so we can send continuing pack if needed.  Make sure he's aware that he needs to choose a quit date prior to starting, that date should be within the first week of the starter pack.

## 2020-04-08 NOTE — Telephone Encounter (Signed)
Patient called stating that he would like a script for Chantix to help him quit smoking. Patient stated he has used Chantix before which helped. Patient stated that he has made his mind up that he needs to quit smoking.  Patient stated Dr. Rockey Situ his heart doctor told him that he needs to quit and recommended that he try Chantix. Patient stated that he has tried the patch before and he does not like them.  Pharmacy [facility]

## 2020-04-09 MED ORDER — VARENICLINE TARTRATE 1 MG PO TABS: ORAL_TABLET | ORAL | 0 refills | Status: DC

## 2020-04-09 NOTE — Telephone Encounter (Signed)
Noted, Rx changed. 

## 2020-04-09 NOTE — Telephone Encounter (Signed)
Called patient reviewed all information and repeated back to me. Will call if any questions.   I also received fax from pharmacy states that needs to be re sent as just [facility]. I have let patient know that I will need to send this message to you and to check with pharmacy before he goes by to pick up. Advised that it may be tomorrow before ready for pick up.

## 2020-04-23 ENCOUNTER — Ambulatory Visit (INDEPENDENT_AMBULATORY_CARE_PROVIDER_SITE_OTHER): Payer: PPO | Admitting: Family Medicine

## 2020-04-23 ENCOUNTER — Other Ambulatory Visit: Payer: Self-pay

## 2020-04-23 ENCOUNTER — Encounter: Payer: Self-pay | Admitting: Family Medicine

## 2020-04-23 VITALS — BP 130/80 | HR 93 | Temp 97.9°F | Ht 65.0 in | Wt 189.5 lb

## 2020-04-23 DIAGNOSIS — M7541 Impingement syndrome of right shoulder: Secondary | ICD-10-CM

## 2020-04-23 DIAGNOSIS — M19019 Primary osteoarthritis, unspecified shoulder: Secondary | ICD-10-CM | POA: Diagnosis not present

## 2020-04-23 MED ORDER — TRIAMCINOLONE ACETONIDE 40 MG/ML IJ SUSP
40.0000 mg | Freq: Once | INTRAMUSCULAR | Status: AC
  Administered 2020-04-23: 40 mg via INTRA_ARTICULAR

## 2020-04-23 NOTE — Progress Notes (Signed)
Brandon Lopez T. Brandon Criado, MD, [place]  [facility] at [facility] [place], [address]  Phone: [phone]  FAX: [phone]  Brandon Lopez - 65 y.o. male  MRN [id number]  Date of Birth: Aug 26, 1955  Date: 04/23/2020  PCP: Pleas Koch, NP  Referral: Pleas Koch, NP  Chief Complaint  Patient presents with  . Shoulder Pain    Right    This visit occurred during the SARS-CoV-2 public health emergency.  Safety protocols were in place, including screening questions prior to the visit, additional usage of staff PPE, and extensive cleaning of exam room while observing appropriate contact time as indicated for disinfecting solutions.   Subjective:   This 65 y.o. male patient noted above presents with shoulder pain that has been ongoing for little bit over 1 week there is no history of trauma or accident recently The patient denies neck pain or radicular symptoms. Denies dislocation, subluxation, separation of the shoulder. The patient does complain of pain in the overhead plane with significant painful arc of motion.  He does do a lot of things repetitive, and he has some work coming up where he will do a fence and some painting.   NO injury.    Going to instrall a fence and paint, etc.   Inj, intra a R  Medications Tried: Tylenol, NSAIDS Ice or Heat: minimally helpful Tried PT: No  Prior shoulder Injury: No Prior surgery: No Prior fracture: No   Review of Systems is noted in the HPI, as appropriate  Objective:   Blood pressure 130/80, pulse 93, temperature 97.9 F (36.6 C), temperature source Temporal, height 5\' 5"  (1.651 m), weight 189 lb 8 oz (86 kg), SpO2 97 %.    Shoulder: Right Inspection: No muscle wasting or winging Ecchymosis/edema: neg  AC joint, scapula, clavicle: Tender to palpation at the [facility] joint Cervical spine: NT, full ROM Spurling's:  neg Abduction: full, 5/5 Flexion: full, 5/5 IR, full, lift-off: 5/5 ER at neutral: full, 5/5 AC crossover: Positive Neer: pos Hawkins: pos Drop Test: neg Empty Can: pos Supraspinatus insertion: mild-mod T Bicipital groove: NT Speed's: neg Yergason's: neg Sulcus sign: neg Scapular dyskinesis: none C5-T1 intact  Neuro: Sensation intact Grip 5/5   Radiology: No results found.  Assessment and Plan:      ICD-10-CM   1. 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  Suspect playing a role with exacerbation.  Primary issue is impingement with rotator cuff tendinopathy.  He is very active, and I told him to continue to use his shoulder fairly normally after a few days.  The patient could benefit ideally from formal PT to assist with scapular stabilization and RTC strengthening, and certainly if symptoms persist.  Intraarticular Shoulder Aspiration/Injection Procedure Note Brandon Lopez 1955-07-17 Date of procedure: 04/23/2020  Procedure: Large Joint Aspiration / Injection of Shoulder, Intraarticular,R Indications: Pain  Procedure Details Verbal consent was obtained from the patient. Risks explained and contrasted with benefits and alternatives. Patient prepped with Chloraprep and Ethyl Chloride used for anesthesia. An intraarticular shoulder injection was performed using the posterior approach; needle placed into joint capsule without difficulty. The patient tolerated the procedure well and had decreased pain post injection. No complications.  Injection: 9 cc of Lidocaine 1% and 1 mL Kenalog 40 mg. Needle: 21 gauge, 2 inch   Follow-up: If symptoms persist  Meds ordered this encounter  Medications  . triamcinolone acetonide (KENALOG-40) injection 40  mg   No orders of the defined types were placed in this encounter.   Signed,  Maud Deed. Audryna Wendt, MD   Patient's Medications  New Prescriptions   No medications on file  Previous Medications   ALBUTEROL (PROAIR HFA) 108 (90 BASE) MCG/ACT INHALER    Inhale 2 puffs into the lungs every 6 (six) hours as needed for wheezing or shortness of breath.   ATORVASTATIN (LIPITOR) 20 MG TABLET    Take 1 tablet (20 mg total) by mouth every evening. For cholesterol   CARVEDILOL (COREG) 3.125 MG TABLET    Take 1 tablet by mouth twice daily   EZETIMIBE (ZETIA) 10 MG TABLET    Take 1 tablet by mouth once daily   LORATADINE (CLARITIN) 10 MG TABLET    Take 10 mg by mouth daily as needed for allergies.    SYMBICORT 160-4.5 MCG/ACT INHALER    Inhale 2 puffs by mouth twice daily   TELMISARTAN-HYDROCHLOROTHIAZIDE (MICARDIS HCT) 40-12.5 MG TABLET    Take 1 tablet by mouth once daily for blood pressure   VARENICLINE (CHANTIX) 1 MG TABLET    Take 1/2 tablet by mouth once daily x 3 days, then 1/2 tablet twice daily x 4 days, then 1 tablet twice daily thereafter.  Modified Medications   No medications on file  Discontinued Medications   No medications on file

## 2020-04-27 DIAGNOSIS — M545 Low back pain, unspecified: Secondary | ICD-10-CM | POA: Diagnosis not present

## 2020-04-29 ENCOUNTER — Other Ambulatory Visit: Payer: Self-pay

## 2020-04-29 ENCOUNTER — Encounter: Payer: Self-pay | Admitting: Internal Medicine

## 2020-04-29 ENCOUNTER — Ambulatory Visit (INDEPENDENT_AMBULATORY_CARE_PROVIDER_SITE_OTHER): Payer: PPO | Admitting: Internal Medicine

## 2020-04-29 VITALS — BP 146/90 | HR 82 | Ht 65.0 in | Wt 189.0 lb

## 2020-04-29 DIAGNOSIS — I495 Sick sinus syndrome: Secondary | ICD-10-CM

## 2020-04-29 DIAGNOSIS — I480 Paroxysmal atrial fibrillation: Secondary | ICD-10-CM

## 2020-04-29 DIAGNOSIS — Z950 Presence of cardiac pacemaker: Secondary | ICD-10-CM

## 2020-04-29 LAB — PACEMAKER DEVICE OBSERVATION

## 2020-04-29 MED ORDER — DAPAGLIFLOZIN PROPANEDIOL 10 MG PO TABS: 10.0000 mg | ORAL_TABLET | Freq: Every day | ORAL | 6 refills | Status: DC

## 2020-04-29 NOTE — Progress Notes (Signed)
Patient Care Team: Pleas Koch, NP as PCP - General (Nurse Practitioner) [name], [facility] as Pharmacist (Pharmacist)   HPI  Brandon Lopez is a 65 y.o. male Seen in follow-up for pacemaker-Biotronik  implanted at [facility] for dyspnea and presyncope. Hx of Afib,  anticoagulation started and then stopped as all SCAF-duration < 12 hrs.Also w PVCs. Orthostatic intolerance   Hospitalized 7/19 for an unusual episode where he had some abdominal discomfort tingling in his chest and not feeling right.  EMS was called.  Notes not available.  There was a comment about unresponsive and cyanotic.    Because of the atrial fibrillation he was started on anticoagulation and his aspirin was discontinued.  The patient denies chest pain, orthopnea or peripheral edema.  There have been no palpitations, lightheadedness or syncope.   Has DOE and PND with symptoms abating in about 10-15 minutes and is able to lie flat afterwards.  Blood pressure at home in the 130 range  Has a diagnosis of sleep apnea but has been not able to tolerate mask; it has been more than 3 or 4 years since he tried   Records and Results Reviewed DATE TEST EF   6/16    Echo  55-60 %  normal  5/16    Myoview  50 %   No ischemia Echo done to clarify EF   7/19  Echo  40-45%     Date Cr K Hgb  11/19 0.76 3.9 15.7<<17.2   12/21 0.76 3.6 15.8          Past Medical History:  Diagnosis Date  . Arthritis   . Chronic systolic CHF (congestive heart failure) ([place])    a. echo 6/16: EF 55-60%, Gr1DD, LA nl, PASP nl; b. echo 7/19: EF 40-45%, unable to exclude RWMA, Gr1DD, LA nl, RVSF nl, PASP nl  . COPD (chronic obstructive pulmonary disease) ([place])   . Depression   . Diverticulosis    Found during colonoscopy  . Hypertension   . Kidney stones   . PAF (paroxysmal atrial fibrillation) ([place])    a. short episodes previously noted on PPM interrogation; b. started on Xarelto 07/2017 given further episodes of Afib  noted during interrogation; c. CHADS2VASc at least 3 (CHF, HTN, vascualr disease with aortic calcifications noted on CT)  . 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 Presence of permanent cardiac pacemaker    a. Biotronik; Dayna Barker DR-T [id number]; placed 08/2012  . Sleep apnea   . Symptomatic bradycardia    a. s/p Biotronik; Dayna Barker DR-T [id number]; placed 08/2012 at [facility]    Past Surgical History:  Procedure Laterality Date  . APPENDECTOMY  age 83  . CARDIAC CATHETERIZATION     7 years ago  . COLONOSCOPY N/A 05/27/2014   Procedure: COLONOSCOPY;  Surgeon: Hulen Luster, MD;  Location: [facility] ENDOSCOPY;  Service: Gastroenterology;  Laterality: N/A;  . INSERT / REPLACE / REMOVE PACEMAKER     3 years ago    Current Outpatient Medications  Medication Sig Dispense Refill  . albuterol (PROAIR HFA) 108 (90 Base) MCG/ACT inhaler Inhale 2 puffs into the lungs every 6 (six) hours as needed for wheezing or shortness of breath. 18 g 2  . atorvastatin (LIPITOR) 20 MG tablet Take 1 tablet (20 mg total) by mouth every evening. For cholesterol 90 tablet 2  . carvedilol (COREG) 3.125 MG tablet Take 1 tablet by mouth twice daily 180 tablet 0  . ezetimibe (ZETIA) 10 MG tablet Take  1 tablet by mouth once daily 90 tablet 3  . lidocaine (LIDODERM) 5 % 1 patch daily.    Marland Kitchen loratadine (CLARITIN) 10 MG tablet Take 10 mg by mouth daily as needed for allergies.     . methocarbamol (ROBAXIN) 500 MG tablet Take 1 tablet by mouth 2 (two) times daily as needed.    . methylPREDNISolone (MEDROL DOSEPAK) 4 MG TBPK tablet Take by mouth as directed.    . SYMBICORT 160-4.5 MCG/ACT inhaler Inhale 2 puffs by mouth twice daily 33 g 1  . telmisartan-hydrochlorothiazide (MICARDIS HCT) 40-12.5 MG tablet Take 1 tablet by mouth once daily for blood pressure 90 tablet 3  . varenicline (CHANTIX) 1 MG tablet Take 1/2 tablet by mouth once daily x 3 days, then 1/2 tablet twice daily x 4 days, then 1 tablet twice daily thereafter. 60 tablet 0   No current  facility-administered medications for this visit.    Allergies  Allergen Reactions  . Simvastatin Diarrhea  . Lisinopril Cough      Review of Systems negative except from HPI and PMH  Physical Exam BP (!) 146/90   Pulse 82   Ht 5\' 5"  (1.651 m)   Wt 189 lb (85.7 kg)   BMI 31.45 kg/m  Well developed and well nourished in no acute distress HENT normal Neck supple with JVP-8 Clear Device pocket well healed; without hematoma or erythema.  There is no tethering  Regular rate and rhythm, no  gallop No murmur Abd-soft with active BS No Clubbing cyanosis  edema Skin-warm and dry A & Oriented  Grossly normal sensory and motor function  ECG atrial paced at 82 Interval 22/09/36 Low voltage Rightward axis  Assessment and  Plan  Sinus bradycardia / 1AVB  Pacemaker-Biotronik    Atrial fibrillation-paroxysmal SCAF  Dyspnea on exertion/HFpEF  Chest pain    Hypertension  Cardiomyopathy-mild  Orthostatic lightheadedness  PVC   Cold intolerance  Continued intermittent atrial fibrillation but duration of all the episodes of less than 12 hours.  Hence, we will consider the SCAF and will not initiate anticoagulation  Dyspnea with exertion.  With his mild cardiomyopathy, we will begin [place].  We will have him follow-up in a couple months.  Will probably benefit from an echocardiogram at that time.  He is volume overloaded; for right now we will not change his diuretic as we are beginning the SGLT2  Blood pressure is borderline elevated; however, in the context of his steroids it is hard to know what to do.  His carvedilol dose is low and following his echo it may be appropriate to uptitrate carvedilol  PVC burden is estimated at 1% by his device  I have encouraged him to follow-up with his PCP regarding sleep apnea and trying new strategies.  His nocturnal dyspnea is relatively rapidly resolving suggesting that its not simply fluid.  To the degree that it is it may be  helped by the SGLT2     Current medicines are reviewed at length with the patient today .  The patient does not  have concerns regarding medicines.

## 2020-04-29 NOTE — Patient Instructions (Addendum)
Medication Instructions:  - Your physician has recommended you make the following change in your medication:   1) START Farxiga 10 mg- take 1 tablet by mouth once daily   Samples Given: Farxiga 10 mg Lot: [id number] Exp: 01/2022 # 1 box    *If you need a refill on your cardiac medications before your next appointment, please call your pharmacy*   Lab Work: - none ordered  If you have labs (blood work) drawn today and your tests are completely normal, you will receive your results only by: Marland Kitchen MyChart Message (if you have MyChart) OR . A paper copy in the mail If you have any lab test that is abnormal or we need to change your treatment, we will call you to review the results.   Testing/Procedures: - none ordered   Follow-Up: At [facility], you and your health needs are our priority.  As part of our continuing mission to provide you with exceptional heart care, we have created designated Provider Care Teams.  These Care Teams include your primary Cardiologist (physician) and Advanced Practice Providers (APPs -  Physician Assistants and Nurse Practitioners) who all work together to provide you with the care you need, when you need it.  We recommend signing up for the patient portal called "MyChart".  Sign up information is provided on this After Visit Summary.  MyChart is used to connect with patients for Virtual Visits (Telemedicine).  Patients are able to view lab/test results, encounter notes, upcoming appointments, etc.  Non-urgent messages can be sent to your provider as well.   To learn more about what you can do with MyChart, go to [email or web address].    Your next appointment:    1) in 3 months with Dr. Rockey Situ APP   2) in 1 year with Dr. Caryl Comes  The format for your next appointment:   In Person  Provider:   as above   Other Instructions  Farxiga (Dapagliflozin) tablets What is this medicine? DAPAGLIFLOZIN (DAP a gli FLOE zin) controls blood sugar in people  with diabetes. It is used with lifestyle changes like diet and exercise. It also treats heart failure and kidney disease. It may lower the risk for treatment of heart failure in the hospital or worsened kidney disease. This medicine may be used for other purposes; ask your health care provider or pharmacist if you have questions. COMMON BRAND NAME(S): Wilder Glade What should I tell my health care provider before I take this medicine? They need to know if you have any of these conditions:  dehydration  diabetic ketoacidosis  diet low in salt  eating less due to illness, surgery, dieting, or any other reason  having surgery  history of pancreatitis or pancreas problems  history of yeast infection of the penis or vagina  if you often drink alcohol  infection in the bladder, kidneys, or urinary tract  kidney disease  low blood pressure  on dialysis  problems urinating  type 1 diabetes  uncircumcised male  an unusual or allergic reaction to dapagliflozin, other medicines, foods, dyes, or preservatives  pregnant or trying to get pregnant  breast-feeding How should I use this medicine? Take this medicine by mouth with water. Take it as directed on the prescription label at the same time every day. You can take it with or without food. If it upsets your stomach, take it with food. Keep taking it unless your health care provider tells you to stop. A special MedGuide will be given to you by the  pharmacist with each prescription and refill. Be sure to read this information carefully each time. Talk to your health care provider about the use of this medicine in children. Special care may be needed. Overdosage: If you think you have taken too much of this medicine contact a poison control center or emergency room at once. NOTE: This medicine is only for you. Do not share this medicine with others. What if I miss a dose? If you miss a dose, take it as soon as you can. If it is almost  time for your next dose, take only that dose. Do not take double or extra doses. What may interact with this medicine? Interactions are not expected. This list may not describe all possible interactions. Give your health care provider a list of all the medicines, herbs, non-prescription drugs, or dietary supplements you use. Also tell them if you smoke, drink alcohol, or use illegal drugs. Some items may interact with your medicine. What should I watch for while using this medicine? Visit your health care provider for regular checks on your progress. Tell your health care provider if your symptoms do not start to get better or if they get worse. This medicine can cause a serious condition in which there is too much acid in the blood. If you develop nausea, vomiting, stomach pain, unusual tiredness, or breathing problems, stop taking this medicine and call your doctor right away. If possible, use a ketone dipstick to check for ketones in your urine. Check with your health care provider if you have severe diarrhea, nausea, and vomiting, or if you sweat a lot. The loss of too much body fluid may make it dangerous for you to take this medicine. A test called the HbA1C (A1C) will be monitored. This is a simple blood test. It measures your blood sugar control over the last 2 to 3 months. You will receive this test every 3 to 6 months. Learn how to check your blood sugar. Learn the symptoms of low and high blood sugar and how to manage them. Always carry a quick-source of sugar with you in case you have symptoms of low blood sugar. Examples include hard sugar candy or glucose tablets. Make sure others know that you can choke if you eat or drink when you develop serious symptoms of low blood sugar, such as seizures or unconsciousness. Get medical help at once. Tell your health care provider if you have high blood sugar. You might need to change the dose of your medicine. If you are sick or exercising more than  usual, you may need to change the dose of your medicine. Do not skip meals. Ask your health care provider if you should avoid alcohol. Many nonprescription cough and cold products contain sugar or alcohol. These can affect blood sugar. Wear a medical ID bracelet or chain. Carry a card that describes your condition. List the medicines and doses you take on the card. What side effects may I notice from receiving this medicine? Side effects that you should report to your doctor or health care professional as soon as possible:  allergic reactions (skin rash, itching or hives, swelling of the face, lips, or tongue)  breathing problems  dizziness  feeling faint or lightheaded, falls  genital infection (fever; tenderness, redness, or swelling in the genitals or area from the genitals to the back of the rectum)  kidney injury (trouble passing urine or change in the amount of urine)  low blood sugar (feeling anxious; confusion; dizziness; increased  hunger; unusually weak or tired; increased sweating; shakiness; cold, clammy skin; irritable; headache; blurred vision; fast heartbeat; loss of consciousness)  muscle weakness  nausea, vomiting, unusual stomach upset or pain  new pain or tenderness, change in skin color, sores or ulcers, or infection in legs or feet  penile discharge, itching, or pain  unusual tiredness  unusual vaginal discharge, itching, or odor  urinary tract infection (fever; chills; a burning feeling when urinating; urgent need to urinate more often; blood in the urine; back pain) Side effects that usually do not require medical attention (report to your doctor or health care professional if they continue or are bothersome):  mild increase in urination  thirsty This list may not describe all possible side effects. Call your doctor for medical advice about side effects. You may report side effects to FDA at 1-800-FDA-1088. Where should I keep my medicine? Keep out of the  reach of children and pets. Store at room temperature between 20 and 25 degrees C (68 and 77 degrees F). Get rid of any unused medicine after the expiration date. To get rid of medicines that are no longer needed or have expired:  Take the medicine to a medicine take-back program. Check with your pharmacy or law enforcement to find a location.  If you cannot return the medicine, check the label or package insert to see if the medicine should be thrown out in the garbage or flushed down the toilet. If you are not sure, ask your health care provider. If it is safe to put it in the trash, take the medicine out of the container. Mix the medicine with cat litter, dirt, coffee grounds, or other unwanted substance. Seal the mixture in a bag or container. Put it in the trash. NOTE: This sheet is a summary. It may not cover all possible information. If you have questions about this medicine, talk to your doctor, pharmacist, or health care provider.  2021 Elsevier/Gold Standard (2019-06-20 13:18:47)

## 2020-05-01 ENCOUNTER — Other Ambulatory Visit: Payer: Self-pay | Admitting: Internal Medicine

## 2020-05-01 ENCOUNTER — Ambulatory Visit (INDEPENDENT_AMBULATORY_CARE_PROVIDER_SITE_OTHER): Payer: PPO | Admitting: Family Medicine

## 2020-05-01 ENCOUNTER — Other Ambulatory Visit: Payer: Self-pay

## 2020-05-01 ENCOUNTER — Telehealth: Payer: Self-pay | Admitting: Family Medicine

## 2020-05-01 ENCOUNTER — Ambulatory Visit (INDEPENDENT_AMBULATORY_CARE_PROVIDER_SITE_OTHER): Payer: PPO

## 2020-05-01 DIAGNOSIS — M545 Low back pain, unspecified: Secondary | ICD-10-CM

## 2020-05-01 DIAGNOSIS — M25511 Pain in right shoulder: Secondary | ICD-10-CM

## 2020-05-01 DIAGNOSIS — G8929 Other chronic pain: Secondary | ICD-10-CM

## 2020-05-01 MED ORDER — TRAMADOL HCL 50 MG PO TABS: 50.0000 mg | ORAL_TABLET | Freq: Four times a day (QID) | ORAL | 0 refills | Status: DC | PRN

## 2020-05-01 MED ORDER — CELECOXIB 100 MG PO CAPS: 100.0000 mg | ORAL_CAPSULE | Freq: Two times a day (BID) | ORAL | 3 refills | Status: DC | PRN

## 2020-05-01 MED ORDER — BACLOFEN 10 MG PO TABS: 5.0000 mg | ORAL_TABLET | Freq: Three times a day (TID) | ORAL | 3 refills | Status: DC | PRN

## 2020-05-01 NOTE — Progress Notes (Signed)
Office Visit Note   Patient: Brandon Lopez           Date of Birth: [birth year]           MRN: [id number] Visit Date: 05/01/2020 Requested by: Pleas Koch, NP [address] [place],  [place] [address] PCP: Pleas Koch, NP  Subjective: Chief Complaint  Patient presents with  . Lower Back - Pain    Pain started over 2 weeks ago, after unloading a truck full of wood. Has been seen at [facility] and to Dr. Noberto Retort for chiropractic care. [facility] gave him prednisone and muscle relaxer (methacarbamol). These have not helped him. Using heat without much relief.     HPI: He is here with low back pain.  Symptoms started about 2 weeks ago.  2 days prior to the onset of his pain he helped unload firewood from a truck.  He did not notice any pain while doing that but 2 days later he woke up with pain and he has had constant pain since then.  He tried over-the-counter remedies for the first week but that was not helping so he went to [facility] and was given prednisone and Robaxin.  Those have not helped either, so he went to see Dr. Noberto Retort 2 days ago but due to the intensity of his pain, he was referred here.  He has had some back troubles in the past but nothing like this.  He has a history of kidney stones in the past but not in this area.  No history of gout or known rheumatologic disease.  No previous fractures.  Denies any rash, denies any bowel or bladder dysfunction.  He thinks he might of had chills recently but no definite fevers.  He also asked me to examine his right shoulder.  He has had chronic pain.  He had a subacromial injection recently which did not help.  He has had x-rays in the past of the left shoulder which showed mild degenerative change.  Apparently he cannot have MRI scans due to a pacemaker.                  ROS:   All other systems were reviewed and are negative.  Objective: Vital Signs: There were no vitals taken for this visit.  Physical  Exam:  General:  Alert and oriented, in no acute distress. Pulm:  Breathing unlabored. Psy:  Normal mood, congruent affect. Skin: No rash Right shoulder: He has pain with overhead reach and behind the back reach.  Range of motion is pretty good overall.  Rotator cuff strength is 5/5 throughout with minimal pain.  No tenderness over the [facility] joint. Back: Very tender to palpation in the midline over the L4 and L5 spinous processes.  Slight tenderness in the paraspinous muscles in this area.  No pain in the sciatic notch, negative straight leg raise.  Lower extremity strength and reflexes are normal.    Imaging: XR Lumbar Spine 2-3 Views  Result Date: 05/01/2020 X-rays lumbar spine compared to July x-rays show no obvious compression fracture.  He does have mild to moderate disc disease at the lower levels and facet arthropathy.  No sign of neoplasm.   Assessment & Plan: 1.  Low back pain, possibly muscular versus facet arthropathy.  Cannot rule out occult compression fracture. -We will try baclofen, tramadol as needed and Celebrex as needed.  Proceed with chiropractic per Dr. Noberto Retort.  If symptoms do not improve, then  could contemplate referral for a one-time injection or imaging with CT scan.  2.  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 -If new medicines do not help, we will consider glenohumeral injection.    Procedures: No procedures performed        PMFS History: Patient Active Problem List   Diagnosis Date Noted  . Chronic back pain 08/17/2019  . Chronic shoulder pain 12/12/2018  . Rectal bleeding 09/19/2018  . Paroxysmal atrial fibrillation ([place]) 10/31/2017  . Syncope 08/10/2017  . Osteoarthritis 03/17/2017  . Edema 07/21/2016  . Cigarette smoker 11/17/2015  . Diverticulosis 12/16/2014  . Chronic obstructive pulmonary disease ([place]) 11/26/2014  . Cervical pain (neck) 10/14/2014  . Hyperlipidemia 07/11/2014  . Preventative health care 07/11/2014  .  Pacemaker 06/03/2014  . Anxiety and depression 06/03/2014  . Alcohol abuse 06/03/2014  . OSA (obstructive sleep apnea) 06/03/2014  . Essential hypertension 06/03/2014  . Tobacco abuse 06/03/2014   Past Medical History:  Diagnosis Date  . Arthritis   . Chronic systolic CHF (congestive heart failure) ([place])    a. echo 6/16: EF 55-60%, Gr1DD, LA nl, PASP nl; b. echo 7/19: EF 40-45%, unable to exclude RWMA, Gr1DD, LA nl, RVSF nl, PASP nl  . COPD (chronic obstructive pulmonary disease) ([place])   . Depression   . Diverticulosis    Found during colonoscopy  . Hypertension   . Kidney stones   . PAF (paroxysmal atrial fibrillation) ([place])    a. short episodes previously noted on PPM interrogation; b. started on Xarelto 07/2017 given further episodes of Afib noted during interrogation; c. CHADS2VASc at least 3 (CHF, HTN, vascualr disease with aortic calcifications noted on CT)  . 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 Presence of permanent cardiac pacemaker    a. Biotronik; Dayna Barker DR-T [id number]; placed 08/2012  . Sleep apnea   . Symptomatic bradycardia    a. s/p Biotronik; Dayna Barker DR-T [id number]; placed 08/2012 at [facility] History  Problem Relation Age of Onset  . Arthritis Mother   . Heart disease Father   . Hyperlipidemia Father   . Hypertension Father   . Cancer Paternal Grandfather     Past Surgical History:  Procedure Laterality Date  . APPENDECTOMY  age 24  . CARDIAC CATHETERIZATION     7 years ago  . COLONOSCOPY N/A 05/27/2014   Procedure: COLONOSCOPY;  Surgeon: Hulen Luster, MD;  Location: [facility] ENDOSCOPY;  Service: Gastroenterology;  Laterality: N/A;  . INSERT / REPLACE / REMOVE PACEMAKER     3 years ago   Social History   Occupational History  . Not on file  Tobacco Use  . Smoking status: Heavy Tobacco Smoker    Packs/day: 2.00    Years: 50.00    Pack years: 100.00    Types: Cigarettes    Start date: 1964  . Smokeless tobacco: Never Used  . Tobacco comment: started smoking at age 68,  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  Vaping Use  . Vaping Use: Never used  Substance and Sexual Activity  . Alcohol use: Yes    Alcohol/week: 8.0 - 14.0 standard drinks    Types: 8 - 14 Cans of beer per week  . Drug use: Yes    Types: Marijuana  . Sexual activity: Not on file

## 2020-05-01 NOTE — Telephone Encounter (Signed)
Patient called advised pharmacy did not receive the Rx for Tramadol. The number to contact patient is [phone]

## 2020-05-01 NOTE — Telephone Encounter (Signed)
I called rx to [place] at [facility] on [address], I called and advised patient that this has been done.

## 2020-05-01 NOTE — Addendum Note (Signed)
Addended by: Hortencia Pilar on: 05/01/2020 11:37 AM   Modules accepted: Orders

## 2020-05-02 MED ORDER — TRAMADOL HCL 50 MG PO TABS: 50.0000 mg | ORAL_TABLET | Freq: Four times a day (QID) | ORAL | 0 refills | Status: DC | PRN

## 2020-05-02 NOTE — Addendum Note (Signed)
Addended by: Hortencia Pilar on: 05/02/2020 07:35 AM   Modules accepted: Orders

## 2020-05-04 ENCOUNTER — Emergency Department (HOSPITAL_COMMUNITY)
Admission: EM | Admit: 2020-05-04 | Discharge: 2020-05-04 | Disposition: A | Discharge status: Home / Self Care | Payer: PPO | Attending: Emergency Medicine | Admitting: Emergency Medicine

## 2020-05-04 ENCOUNTER — Emergency Department (HOSPITAL_COMMUNITY): Payer: PPO

## 2020-05-04 ENCOUNTER — Encounter (HOSPITAL_COMMUNITY): Payer: Self-pay | Admitting: Emergency Medicine

## 2020-05-04 ENCOUNTER — Other Ambulatory Visit: Payer: Self-pay

## 2020-05-04 ENCOUNTER — Encounter (HOSPITAL_COMMUNITY): Payer: Self-pay | Admitting: *Deleted

## 2020-05-04 ENCOUNTER — Ambulatory Visit (HOSPITAL_COMMUNITY)
Admission: EM | Admit: 2020-05-04 | Discharge: 2020-05-04 | Disposition: A | Discharge status: Home / Self Care | Payer: PPO | Attending: Student | Admitting: Student

## 2020-05-04 DIAGNOSIS — Z87442 Personal history of urinary calculi: Secondary | ICD-10-CM | POA: Diagnosis not present

## 2020-05-04 DIAGNOSIS — F17210 Nicotine dependence, cigarettes, uncomplicated: Secondary | ICD-10-CM | POA: Insufficient documentation

## 2020-05-04 DIAGNOSIS — J449 Chronic obstructive pulmonary disease, unspecified: Secondary | ICD-10-CM | POA: Insufficient documentation

## 2020-05-04 DIAGNOSIS — M545 Low back pain, unspecified: Secondary | ICD-10-CM | POA: Insufficient documentation

## 2020-05-04 DIAGNOSIS — M5459 Other low back pain: Secondary | ICD-10-CM | POA: Diagnosis not present

## 2020-05-04 DIAGNOSIS — I110 Hypertensive heart disease with heart failure: Secondary | ICD-10-CM | POA: Diagnosis not present

## 2020-05-04 DIAGNOSIS — R3912 Poor urinary stream: Secondary | ICD-10-CM | POA: Insufficient documentation

## 2020-05-04 DIAGNOSIS — Z79899 Other long term (current) drug therapy: Secondary | ICD-10-CM | POA: Insufficient documentation

## 2020-05-04 DIAGNOSIS — I5022 Chronic systolic (congestive) heart failure: Secondary | ICD-10-CM | POA: Diagnosis not present

## 2020-05-04 DIAGNOSIS — Z950 Presence of cardiac pacemaker: Secondary | ICD-10-CM | POA: Insufficient documentation

## 2020-05-04 DIAGNOSIS — Z9861 Coronary angioplasty status: Secondary | ICD-10-CM | POA: Insufficient documentation

## 2020-05-04 DIAGNOSIS — R1032 Left lower quadrant pain: Secondary | ICD-10-CM | POA: Diagnosis not present

## 2020-05-04 LAB — CBC
HCT: 47.2 % (ref 39.0–52.0)
Hemoglobin: 16.5 g/dL (ref 13.0–17.0)
MCH: 32.9 pg (ref 26.0–34.0)
MCHC: 35 g/dL (ref 30.0–36.0)
MCV: 94 fL (ref 80.0–100.0)
Platelets: 202 10*3/uL (ref 150–400)
RBC: 5.02 MIL/uL (ref 4.22–5.81)
RDW: 12.2 % (ref 11.5–15.5)
WBC: 14.9 10*3/uL — ABNORMAL HIGH (ref 4.0–10.5)
nRBC: 0 % (ref 0.0–0.2)

## 2020-05-04 LAB — COMPREHENSIVE METABOLIC PANEL
ALT: 29 U/L (ref 0–44)
AST: 16 U/L (ref 15–41)
Albumin: 3.3 g/dL — ABNORMAL LOW (ref 3.5–5.0)
Alkaline Phosphatase: 58 U/L (ref 38–126)
Anion gap: 6 (ref 5–15)
BUN: 11 mg/dL (ref 8–23)
CO2: 28 mmol/L (ref 22–32)
Calcium: 8.3 mg/dL — ABNORMAL LOW (ref 8.9–10.3)
Chloride: 101 mmol/L (ref 98–111)
Creatinine, Ser: 0.74 mg/dL (ref 0.61–1.24)
GFR, Estimated: >60 mL/min (ref >60)
Glucose, Bld: 102 mg/dL — ABNORMAL HIGH (ref 70–99)
Potassium: 3.7 mmol/L (ref 3.5–5.1)
Sodium: 135 mmol/L (ref 135–145)
Total Bilirubin: 1.1 mg/dL (ref 0.3–1.2)
Total Protein: 6.2 g/dL — ABNORMAL LOW (ref 6.5–8.1)

## 2020-05-04 LAB — POCT URINALYSIS DIPSTICK, ED / UC
Bilirubin Urine: NEGATIVE
Glucose, UA: 500 mg/dL — AB
Hgb urine dipstick: NEGATIVE
Ketones, ur: NEGATIVE mg/dL
Leukocytes,Ua: NEGATIVE
Nitrite: NEGATIVE
Protein, ur: NEGATIVE mg/dL
Specific Gravity, Urine: 1.015 (ref 1.005–1.030)
Urobilinogen, UA: 0.2 mg/dL (ref 0.0–1.0)
pH: 7 (ref 5.0–8.0)

## 2020-05-04 MED ORDER — OXYCODONE HCL 5 MG PO TABS
10.0000 mg | ORAL_TABLET | Freq: Once | ORAL | Status: AC
  Administered 2020-05-04: 10 mg via ORAL
  Filled 2020-05-04: qty 2

## 2020-05-04 MED ORDER — HYDROMORPHONE HCL 1 MG/ML IJ SOLN
0.5000 mg | Freq: Once | INTRAMUSCULAR | Status: AC
Start: 2020-05-04 — End: 2020-05-04
  Administered 2020-05-04: 0.5 mg via INTRAVENOUS
  Filled 2020-05-04: qty 1

## 2020-05-04 MED ORDER — ONDANSETRON HCL 4 MG/2ML IJ SOLN
4.0000 mg | Freq: Once | INTRAMUSCULAR | Status: AC
  Administered 2020-05-04: 4 mg via INTRAVENOUS
  Filled 2020-05-04: qty 2

## 2020-05-04 MED ORDER — OXYCODONE HCL 5 MG PO TABS: 5.0000 mg | ORAL_TABLET | Freq: Four times a day (QID) | ORAL | 0 refills | Status: DC | PRN

## 2020-05-04 MED ORDER — HYDROMORPHONE HCL 1 MG/ML IJ SOLN
0.5000 mg | Freq: Once | INTRAMUSCULAR | Status: AC
  Administered 2020-05-04: 0.5 mg via INTRAVENOUS
  Filled 2020-05-04: qty 1

## 2020-05-04 NOTE — ED Triage Notes (Signed)
Pt reports he has had back pain for over 2 weeks . Pt has seen 3 different DRs. For this pain. Pt has had not relief from back pain.

## 2020-05-04 NOTE — ED Provider Notes (Signed)
[facility]    CSN: [id number] Arrival date & time: 05/04/20  1001      History   Chief Complaint Chief Complaint  Patient presents with  . Back Pain    HPI Brandon Lopez is a 65 y.o. male presenting with continued back pain for 2 weeks.  History of chronic back pain, chronic shoulder pain, rectal bleeding, hypertension, OSA, pacemaker, hyperlipidemia, kidney stones, A. fib.  Notes 2 weeks of back pain following unloading firewood from a truck.  Has already been evaluated by chiropractor, [facility] and also orthopedics and is being treated with prednisone, tramadol, Celebrex, Robaxin. Nonetheless reports persistent pain that is unchanged by current regimen. Denies new trauma. Notes weak urinary stream but otherwise no urinary complaints, denies changes in bowel function. Denies hematuria, dysuria, frequency. Distant history of kidney stones and does express concern for this. Denies pain shooting down legs, denies numbness in arms/legs, denies weakness in arms/legs, denies saddle anesthesia, denies bowel/bladder incontinence.    HPI  Past Medical History:  Diagnosis Date  . Arthritis   . Chronic systolic CHF (congestive heart failure) ([place])    a. echo 6/16: EF 55-60%, Gr1DD, LA nl, PASP nl; b. echo 7/19: EF 40-45%, unable to exclude RWMA, Gr1DD, LA nl, RVSF nl, PASP nl  . COPD (chronic obstructive pulmonary disease) ([place])   . Depression   . Diverticulosis    Found during colonoscopy  . Hypertension   . Kidney stones   . PAF (paroxysmal atrial fibrillation) ([place])    a. short episodes previously noted on PPM interrogation; b. started on Xarelto 07/2017 given further episodes of Afib noted during interrogation; c. CHADS2VASc at least 3 (CHF, HTN, vascualr disease with aortic calcifications noted on CT)  . 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 Presence of permanent cardiac pacemaker    a. Biotronik; Dayna Barker DR-T [id number]; placed 08/2012  . Sleep apnea   . Symptomatic  bradycardia    a. s/p Biotronik; Dayna Barker DR-T [id number]; placed 08/2012 at [facility]    Patient Active Problem List   Diagnosis Date Noted  . Chronic back pain 08/17/2019  . Chronic shoulder pain 12/12/2018  . Rectal bleeding 09/19/2018  . Paroxysmal atrial fibrillation ([place]) 10/31/2017  . Syncope 08/10/2017  . Osteoarthritis 03/17/2017  . Edema 07/21/2016  . Cigarette smoker 11/17/2015  . Diverticulosis 12/16/2014  . Chronic obstructive pulmonary disease ([place]) 11/26/2014  . Cervical pain (neck) 10/14/2014  . Hyperlipidemia 07/11/2014  . Preventative health care 07/11/2014  . Pacemaker 06/03/2014  . Anxiety and depression 06/03/2014  . Alcohol abuse 06/03/2014  . OSA (obstructive sleep apnea) 06/03/2014  . Essential hypertension 06/03/2014  . Tobacco abuse 06/03/2014    Past Surgical History:  Procedure Laterality Date  . APPENDECTOMY  age 28  . CARDIAC CATHETERIZATION     7 years ago  . COLONOSCOPY N/A 05/27/2014   Procedure: COLONOSCOPY;  Surgeon: Hulen Luster, MD;  Location: [facility] ENDOSCOPY;  Service: Gastroenterology;  Laterality: N/A;  . INSERT / REPLACE / REMOVE PACEMAKER     3 years ago       Home Medications    Prior to Admission medications   Medication Sig Start Date End Date Taking? Authorizing Provider  albuterol (PROAIR HFA) 108 (90 Base) MCG/ACT inhaler Inhale 2 puffs into the lungs every 6 (six) hours as needed for wheezing or shortness of breath. 09/22/18   Pleas Koch, NP  atorvastatin (LIPITOR) 20 MG tablet Take 1 tablet (20 mg total) by mouth every  evening. For cholesterol 03/26/20   Pleas Koch, NP  baclofen (LIORESAL) 10 MG tablet Take 0.5-1 tablets (5-10 mg total) by mouth 3 (three) times daily as needed for muscle spasms. 05/01/20   Hilts, Legrand Como, MD  carvedilol (COREG) 3.125 MG tablet Take 1 tablet by mouth twice daily 05/02/20   Deboraha Sprang, MD  celecoxib (CELEBREX) 100 MG capsule Take 1 capsule (100 mg total) by mouth 2 (two) times daily as  needed. 05/01/20   Hilts, Legrand Como, MD  dapagliflozin propanediol (FARXIGA) 10 MG TABS tablet Take 1 tablet (10 mg total) by mouth daily. 04/29/20   Deboraha Sprang, MD  ezetimibe (ZETIA) 10 MG tablet Take 1 tablet by mouth once daily 02/25/20   Minna Merritts, MD  lidocaine (LIDODERM) 5 % 1 patch daily. 04/27/20   [provider]  loratadine (CLARITIN) 10 MG tablet Take 10 mg by mouth daily as needed for allergies.     [provider]  methocarbamol (ROBAXIN) 500 MG tablet Take 1 tablet by mouth 2 (two) times daily as needed. 04/27/20   [provider]  methylPREDNISolone (MEDROL DOSEPAK) 4 MG TBPK tablet Take by mouth as directed. 04/27/20   [provider]  SYMBICORT 160-4.5 MCG/ACT inhaler Inhale 2 puffs by mouth twice daily 02/25/20   Pleas Koch, NP  telmisartan-hydrochlorothiazide (MICARDIS HCT) 40-12.5 MG tablet Take 1 tablet by mouth once daily for blood pressure 03/21/20   Pleas Koch, NP  traMADol (ULTRAM) 50 MG tablet Take 1 tablet (50 mg total) by mouth every 6 (six) hours as needed. 05/02/20   Hilts, Legrand Como, MD  varenicline (CHANTIX) 1 MG tablet Take 1/2 tablet by mouth once daily x 3 days, then 1/2 tablet twice daily x 4 days, then 1 tablet twice daily thereafter. 04/09/20   Pleas Koch, NP    Family History Family History  Problem Relation Age of Onset  . Arthritis Mother   . Heart disease Father   . Hyperlipidemia Father   . Hypertension Father   . Cancer Paternal Grandfather     Social History Social History   Tobacco Use  . Smoking status: Heavy Tobacco Smoker    Packs/day: 2.00    Years: 50.00    Pack years: 100.00    Types: Cigarettes    Start date: 1964  . Smokeless tobacco: Never Used  . Tobacco comment: started smoking at age 10, 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  Vaping Use  . Vaping Use: Never used  Substance Use Topics  . Alcohol use: Yes    Alcohol/week: 8.0 - 14.0 standard drinks    Types: 8 - 14 Cans of beer  per week  . Drug use: Yes    Types: Marijuana     Allergies   Simvastatin and Lisinopril   Review of Systems Review of Systems  Constitutional: Negative for chills, fever and unexpected weight change.  Respiratory: Negative for chest tightness and shortness of breath.   Cardiovascular: Negative for chest pain and palpitations.  Gastrointestinal: Negative for abdominal pain, diarrhea, nausea and vomiting.  Genitourinary: Negative for decreased urine volume, difficulty urinating and frequency.  Musculoskeletal: Positive for back pain. Negative for arthralgias, gait problem, joint swelling, myalgias, neck pain and neck stiffness.  Skin: Negative for wound.  Neurological: Negative for dizziness, tremors, seizures, syncope, facial asymmetry, speech difficulty, weakness, light-headedness, numbness and headaches.  All other systems reviewed and are negative.    Physical Exam Triage Vital Signs ED Triage Vitals  Enc Vitals Group  BP      Pulse      Resp      Temp      Temp src      SpO2      Weight      Height      Head Circumference      Peak Flow      Pain Score      Pain Loc      Pain Edu?      Excl. in [place]?    No data found.  Updated Vital Signs BP 100/71 (BP Location: Left Arm)   Pulse 81   Temp 98.3 F (36.8 C) (Oral)   Resp 18   SpO2 95%   Visual Acuity Right Eye Distance:   Left Eye Distance:   Bilateral Distance:    Right Eye Near:   Left Eye Near:    Bilateral Near:     Physical Exam Vitals reviewed.  Constitutional:      General: He is not in acute distress.    Appearance: Normal appearance. He is not ill-appearing.  HENT:     Head: Normocephalic and atraumatic.  Cardiovascular:     Rate and Rhythm: Normal rate and regular rhythm.     Heart sounds: Normal heart sounds.  Pulmonary:     Effort: Pulmonary effort is normal.     Breath sounds: Normal breath sounds and air entry.  Abdominal:     Palpations: Abdomen is soft.     Tenderness:  There is no abdominal tenderness. There is no right CVA tenderness, left CVA tenderness, guarding or rebound.     Comments: No bowel or bladder incontinence.  Musculoskeletal:     Cervical back: Normal range of motion. No swelling, deformity, signs of trauma, rigidity, spasms, tenderness, bony tenderness or crepitus. No pain with movement.     Thoracic back: No swelling, deformity, signs of trauma, spasms, tenderness or bony tenderness. Normal range of motion. No scoliosis.     Lumbar back: Spasms, tenderness and bony tenderness present. No swelling, deformity or signs of trauma. Normal range of motion. Negative right straight leg raise test and negative left straight leg raise test. No scoliosis.     Comments: Strength 5/5 in UEs and LEs. Gait intact but with pain. sensation intact, no saddle anesthesia. Back: Very tender to palpation in the midline over the L4 and L5 spine.  Mild bilateral paraspinous muscle tenderness without CVAT.  Negative straight leg raise.  Lower extremity strength and reflexes are normal. No deformity or stepoff.  Skin:    Capillary Refill: Capillary refill takes less than 2 seconds.  Neurological:     General: No focal deficit present.     Mental Status: He is alert and oriented to person, place, and time.     Cranial Nerves: No cranial nerve deficit.     Comments: Strength 5/5 in UEs and LEs. Gait normal. Sensation intact in UEs and LEs.   Psychiatric:        Mood and Affect: Mood normal.        Behavior: Behavior normal.        Thought Content: Thought content normal.        Judgment: Judgment normal.      UC Treatments / Results  Labs (all labs ordered are listed, but only abnormal results are displayed) Labs Reviewed  POCT URINALYSIS DIPSTICK, ED / UC - Abnormal; Notable for the following components:      Result Value  Glucose, UA 500 (*)    All other components within normal limits    EKG   Radiology No results found.  Procedures Procedures  (including critical care time)  Medications Ordered in UC Medications - No data to display  Initial Impression / Assessment and Plan / UC Course  I have reviewed the triage vital signs and the nursing notes.  Pertinent labs & imaging results that were available during my care of the patient were reviewed by me and considered in my medical decision making (see chart for details).     This patient is a 65 year old male presenting with acute on chronic back pain. No red flag symptoms.  Has already been evaluated by ortho multiple times and is being treated appropriately with Tramadol, Robaxin, Celebrex, Prednisone. Awaiting ortho f/u and chiropractor f/u. Ortho did recommend outpatient CT/MRI if symptoms persist. 05/01/20 xray- no obvious fracture or bony deformity. Moderate disc disease at lower levels.  Pt with hx kidney stones >20 years ago; UA today wnl. As patient is already being treated with appropriate regimen, discussed that he should follow-up with his orthopedist outpatient.  He states that he cannot bear the pain and that he wishes evaluation in the ER for this.  He is hemodynamically stable for transport to the ER.   Final Clinical Impressions(s) / UC Diagnoses   Final diagnoses:  Acute midline low back pain without sciatica     Discharge Instructions     -Head straight to [facility], ER for further evaluation and management of severe back pain. -If your symptoms worsen or persist on the way, like worsening back pain, weakness in your legs, dizziness, shortness of breath-stop and call 911.    ED Prescriptions    None     I have reviewed the PDMP during this encounter.   Hazel Sams, PA-C 05/04/20 1058

## 2020-05-04 NOTE — ED Provider Notes (Signed)
[facility] EMERGENCY DEPARTMENT Provider Note   CSN: [id number] Arrival date & time: 05/04/20  1109     History Chief Complaint  Patient presents with  . Back Pain    Brandon Lopez is a 65 y.o. male.  Patient with history of atrial fibrillation, not on anticoagulation, permanent cardiac pacemaker due to symptomatic bradycardia and presyncope, CHF, COPD continuing to smoke and drink alcohol --presents the emergency department today for evaluation of lower back pain.  Patient developed back pain about 2 weeks ago, 2 days after moving a pile of wood.  Pain does not radiate.  It has been worse over the past 3 to 4 days and has been having difficulty with ambulation.  This morning only, he woke up with some mild left lower quadrant pain.  He has had more of a decreased urine stream, however dipstick today at urgent care was negative per his report.  No hematuria, dysuria.  No constipation, diarrhea, blood in stool.  No fevers or chills.  Patient denies warning symptoms of back pain including: fecal incontinence, urinary retention or overflow incontinence, night sweats, waking from sleep with back pain, unexplained fevers or weight loss, h/o cancer, IVDU, recent trauma.           Past Medical History:  Diagnosis Date  . Arthritis   . Chronic systolic CHF (congestive heart failure) ([place])    a. echo 6/16: EF 55-60%, Gr1DD, LA nl, PASP nl; b. echo 7/19: EF 40-45%, unable to exclude RWMA, Gr1DD, LA nl, RVSF nl, PASP nl  . COPD (chronic obstructive pulmonary disease) ([place])   . Depression   . Diverticulosis    Found during colonoscopy  . Hypertension   . Kidney stones   . PAF (paroxysmal atrial fibrillation) ([place])    a. short episodes previously noted on PPM interrogation; b. started on Xarelto 07/2017 given further episodes of Afib noted during interrogation; c. CHADS2VASc at least 3 (CHF, HTN, vascualr disease with aortic calcifications noted on CT)  . 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 Presence of permanent cardiac pacemaker    a. Biotronik; Dayna Barker DR-T [id number]; placed 08/2012  . Sleep apnea   . Symptomatic bradycardia    a. s/p Biotronik; Dayna Barker DR-T [id number]; placed 08/2012 at [facility]    Patient Active Problem List   Diagnosis Date Noted  . Chronic back pain 08/17/2019  . Chronic shoulder pain 12/12/2018  . Rectal bleeding 09/19/2018  . Paroxysmal atrial fibrillation ([place]) 10/31/2017  . Syncope 08/10/2017  . Osteoarthritis 03/17/2017  . Edema 07/21/2016  . Cigarette smoker 11/17/2015  . Diverticulosis 12/16/2014  . Chronic obstructive pulmonary disease ([place]) 11/26/2014  . Cervical pain (neck) 10/14/2014  . Hyperlipidemia 07/11/2014  . Preventative health care 07/11/2014  . Pacemaker 06/03/2014  . Anxiety and depression 06/03/2014  . Alcohol abuse 06/03/2014  . OSA (obstructive sleep apnea) 06/03/2014  . Essential hypertension 06/03/2014  . Tobacco abuse 06/03/2014    Past Surgical History:  Procedure Laterality Date  . APPENDECTOMY  age 26  . CARDIAC CATHETERIZATION     7 years ago  . COLONOSCOPY N/A 05/27/2014   Procedure: COLONOSCOPY;  Surgeon: Hulen Luster, MD;  Location: [facility] ENDOSCOPY;  Service: Gastroenterology;  Laterality: N/A;  . INSERT / REPLACE / REMOVE PACEMAKER     3 years ago       Family History  Problem Relation Age of Onset  . Arthritis Mother   . Heart disease Father   . Hyperlipidemia Father   .  Hypertension Father   . Cancer Paternal Grandfather     Social History   Tobacco Use  . Smoking status: Heavy Tobacco Smoker    Packs/day: 2.00    Years: 50.00    Pack years: 100.00    Types: Cigarettes    Start date: 1964  . Smokeless tobacco: Never Used  . Tobacco comment: started smoking at age 9, 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  Vaping Use  . Vaping Use: Never used  Substance Use Topics  . Alcohol use: Yes    Alcohol/week: 8.0 - 14.0 standard drinks    Types: 8 - 14 Cans of beer per week  . Drug use: Yes     Types: Marijuana    Home Medications Prior to Admission medications   Medication Sig Start Date End Date Taking? Authorizing Provider  albuterol (PROAIR HFA) 108 (90 Base) MCG/ACT inhaler Inhale 2 puffs into the lungs every 6 (six) hours as needed for wheezing or shortness of breath. 09/22/18   Pleas Koch, NP  atorvastatin (LIPITOR) 20 MG tablet Take 1 tablet (20 mg total) by mouth every evening. For cholesterol 03/26/20   Pleas Koch, NP  baclofen (LIORESAL) 10 MG tablet Take 0.5-1 tablets (5-10 mg total) by mouth 3 (three) times daily as needed for muscle spasms. 05/01/20   Hilts, Legrand Como, MD  carvedilol (COREG) 3.125 MG tablet Take 1 tablet by mouth twice daily 05/02/20   Deboraha Sprang, MD  celecoxib (CELEBREX) 100 MG capsule Take 1 capsule (100 mg total) by mouth 2 (two) times daily as needed. 05/01/20   Hilts, Legrand Como, MD  dapagliflozin propanediol (FARXIGA) 10 MG TABS tablet Take 1 tablet (10 mg total) by mouth daily. 04/29/20   Deboraha Sprang, MD  ezetimibe (ZETIA) 10 MG tablet Take 1 tablet by mouth once daily 02/25/20   Minna Merritts, MD  lidocaine (LIDODERM) 5 % 1 patch daily. 04/27/20   [provider]  loratadine (CLARITIN) 10 MG tablet Take 10 mg by mouth daily as needed for allergies.     [provider]  methocarbamol (ROBAXIN) 500 MG tablet Take 1 tablet by mouth 2 (two) times daily as needed. 04/27/20   [provider]  methylPREDNISolone (MEDROL DOSEPAK) 4 MG TBPK tablet Take by mouth as directed. 04/27/20   [provider]  SYMBICORT 160-4.5 MCG/ACT inhaler Inhale 2 puffs by mouth twice daily 02/25/20   Pleas Koch, NP  telmisartan-hydrochlorothiazide (MICARDIS HCT) 40-12.5 MG tablet Take 1 tablet by mouth once daily for blood pressure 03/21/20   Pleas Koch, NP  traMADol (ULTRAM) 50 MG tablet Take 1 tablet (50 mg total) by mouth every 6 (six) hours as needed. 05/02/20   Hilts, Legrand Como, MD  varenicline (CHANTIX) 1 MG tablet Take  1/2 tablet by mouth once daily x 3 days, then 1/2 tablet twice daily x 4 days, then 1 tablet twice daily thereafter. 04/09/20   Pleas Koch, NP    Allergies    Simvastatin and Lisinopril  Review of Systems   Review of Systems  Constitutional: Negative for fever and unexpected weight change.  HENT: Negative for rhinorrhea and sore throat.   Eyes: Negative for redness.  Respiratory: Negative for cough.   Cardiovascular: Negative for chest pain.  Gastrointestinal: Positive for abdominal pain. Negative for constipation, diarrhea, nausea and vomiting.       Neg for fecal incontinence  Genitourinary: Negative for difficulty urinating, dysuria, flank pain, frequency and hematuria.       Negative  for urinary incontinence or retention  Musculoskeletal: Positive for back pain. Negative for myalgias.  Skin: Negative for rash.  Neurological: Negative for weakness, numbness and headaches.       Negative for saddle paresthesias     Physical Exam Updated Vital Signs BP 94/84   Pulse 70   Temp 98 F (36.7 C) (Oral)   Resp 14   SpO2 94%   Physical Exam Vitals and nursing note reviewed.  Constitutional:      Appearance: He is well-developed.  HENT:     Head: Normocephalic and atraumatic.  Eyes:     General:        Right eye: No discharge.        Left eye: No discharge.     Conjunctiva/sclera: Conjunctivae normal.  Cardiovascular:     Rate and Rhythm: Normal rate and regular rhythm.     Heart sounds: Normal heart sounds.  Pulmonary:     Effort: Pulmonary effort is normal.     Breath sounds: Normal breath sounds.  Abdominal:     Palpations: Abdomen is soft.     Tenderness: There is no abdominal tenderness. There is no guarding or rebound.     Comments: Abdomen is soft and nontender at time of my exam.  Musculoskeletal:        General: Normal range of motion.     Cervical back: Normal range of motion and neck supple. No tenderness or bony tenderness. Normal range of motion.      Thoracic back: No tenderness or bony tenderness. Normal range of motion.     Lumbar back: Tenderness present. Normal range of motion.     Comments: No step-off noted with palpation of spine.  Patient with mild tenderness to palpation, and reported pain, over the middle lower L-spine.  No skin findings.  Skin:    General: Skin is warm and dry.  Neurological:     Mental Status: He is alert.     Sensory: No sensory deficit.     Motor: No abnormal muscle tone.     Deep Tendon Reflexes: Reflexes are normal and symmetric.     Comments: 5/5 strength in entire lower extremities bilaterally. No sensation deficit.      ED Results / Procedures / Treatments   Labs (all labs ordered are listed, but only abnormal results are displayed) Labs Reviewed  CBC - Abnormal; Notable for the following components:      Result Value   WBC 14.9 (*)    All other components within normal limits  COMPREHENSIVE METABOLIC PANEL - Abnormal; Notable for the following components:   Glucose, Bld 102 (*)    Calcium 8.3 (*)    Total Protein 6.2 (*)    Albumin 3.3 (*)    All other components within normal limits    EKG None  Radiology CT Lumbar Spine Wo Contrast  Result Date: 05/04/2020 CLINICAL DATA:  Low back pain EXAM: CT LUMBAR SPINE WITHOUT CONTRAST TECHNIQUE: Multidetector CT imaging of the lumbar spine was performed without intravenous contrast administration. Multiplanar CT image reconstructions were also generated. COMPARISON:  None. FINDINGS: Segmentation: Normal Alignment: Normal Vertebrae: Negative for fracture or mass Paraspinal and other soft tissues: Atherosclerotic aorta and iliacs without aneurysm. No paraspinous mass or adenopathy. Disc levels: T12-L1: Negative L1-2: Mild disc bulging.  Negative for disc protrusion or stenosis L2-3: Negative L3-4: Mild disc bulging.  Negative for disc protrusion or stenosis. L4-5: Mild disc bulging and mild facet degeneration. Negative for disc protrusion  or  stenosis. L5-S1: Negative IMPRESSION: Negative for fracture Mild lumbar degenerative change without neural impingement or stenosis. Electronically Signed   By: Franchot Gallo M.D.   On: 05/04/2020 12:58    Procedures Procedures   Medications Ordered in ED Medications  oxyCODONE (Oxy IR/ROXICODONE) immediate release tablet 10 mg (has no administration in time range)  HYDROmorphone (DILAUDID) injection 0.5 mg (0.5 mg Intravenous Given 05/04/20 1215)  ondansetron (ZOFRAN) injection 4 mg (4 mg Intravenous Given 05/04/20 1215)  HYDROmorphone (DILAUDID) injection 0.5 mg (0.5 mg Intravenous Given 05/04/20 1303)    ED Course  I have reviewed the triage vital signs and the nursing notes.  Pertinent labs & imaging results that were available during my care of the patient were reviewed by me and considered in my medical decision making (see chart for details).  Patient seen and examined.  CT ordered.  Patient unable to get MRI due to pacemaker.  Medications ordered. Patient is will need to find a ride prior to any stronger pain medication.  Vital signs reviewed and are as follows: Vitals:   05/04/20 1123 05/04/20 1528  BP: 94/84 (!) 119/93  Pulse: 70 73  Resp: 14 18  Temp: 98 F (36.7 C) 98.2 F (36.8 C)  SpO2: 94% 93%   Discussed CT findings with patient and family at bedside.  Pain better controlled after 2 doses of IV medication.  Patient also has concerns about various things.  They note a friend who had back pain and ended up having prostate cancer.  I assured him that there were no bony findings of concern on today's CT.  He is also concerned about his liver function tests given alcohol use.  He would feel more comfortable if he had additional lab work today.  CBC and CMP ordered.  3:50 PM reviewed lab results with patient at bedside.  White blood cell count is elevated.  He was on steroids up until a few days ago.  He does not have any other concerning signs of infection at this  point.  He has had poor pain relief at home with current regimen.  Will prescribe #8 oxycodone 5 mg tablets and give a dose prior to discharge.  Use pain medication only under direct supervision at the lowest possible dose needed to control your pain.  Patient counseled on use of narcotic pain medications. Urged not to drink alcohol, drive, or perform any other activities that requires focus while taking these medications.  He has a ride home today.  The patient verbalizes understanding and agrees with the plan.  Strongly encouraged continued follow-up with PCP/orthopedics.  Patient urged to follow-up with PCP if pain does not improve with treatment and rest or if pain becomes recurrent. Urged to return with worsening severe pain, loss of bowel or bladder control, trouble walking.   The patient verbalizes understanding and agrees with the plan.    MDM Rules/Calculators/A&P                          Patient with debilitating back pain has been difficult to control over the past 2 weeks.  No concerning red flag symptoms.  CT ordered today to evaluate for compression fracture or other bony abnormality.  Fortunately this only showed some mild disc bulging and possible facet degeneration.  Lab work as above.  Symptoms better controlled while in the emergency department.  He will require continued follow-up and management.  No indication for admission today.  He looks well, in no distress.   Final Clinical Impression(s) / ED Diagnoses Final diagnoses:  Acute midline low back pain without sciatica    Rx / DC Orders ED Discharge Orders         Ordered    oxyCODONE (OXY IR/ROXICODONE) 5 MG immediate release tablet  Every 6 hours PRN        05/04/20 1542           Carlisle Cater, PA-C 05/04/20 [place], [place], DO 05/05/20 [phone]

## 2020-05-04 NOTE — ED Triage Notes (Signed)
C/o lower back pain x 2 weeks since unloading wood.  Taking Tramadol and Celebrex without relief.  States he took 3-4 muscle relaxers without relief.  Sent from [facility].

## 2020-05-04 NOTE — ED Notes (Signed)
Pt states pain med was initially effective, though now pain level is back to a 8 now with any movement, states it is ok as long as he is not moving

## 2020-05-04 NOTE — Discharge Instructions (Signed)
-  Head straight to [facility], ER for further evaluation and management of severe back pain. -If your symptoms worsen or persist on the way, like worsening back pain, weakness in your legs, dizziness, shortness of breath-stop and call 911.

## 2020-05-04 NOTE — Discharge Instructions (Signed)
Please read and follow all provided instructions.  Your diagnoses today include:  1. Acute midline low back pain without sciatica     Tests performed today include: Vital signs - see below for your results today CT scan of your back - shows mild disc bulging but no other concerning findings Blood cell counts (white, red, and platelets) - white cells were high Electrolytes  Kidney function test  Medications prescribed:  Oxycodone - narcotic pain medication  DO NOT drive or perform any activities that require you to be awake and alert because this medicine can make you drowsy.   Take any prescribed medications only as directed.  Home care instructions:  Follow any educational materials contained in this packet Please rest, use ice or heat on your back for the next several days Do not lift, push, pull anything more than 10 pounds for the next week  Follow-up instructions: Please follow-up with your PCP and ortho providers in the next week for continued evaluation.   Return instructions:  SEEK IMMEDIATE MEDICAL ATTENTION IF YOU HAVE: New numbness, tingling, weakness, or problem with the use of your arms or legs Severe back pain not relieved with medications Loss control of your bowels or bladder Increasing pain in any areas of the body (such as chest or abdominal pain) Shortness of breath, dizziness, or fainting.  Worsening nausea (feeling sick to your stomach), vomiting, fever, or sweats Any other emergent concerns regarding your health   Additional Information:  Your vital signs today were: BP 94/84   Pulse 70   Temp 98 F (36.7 C) (Oral)   Resp 14   SpO2 94%  If your blood pressure (BP) was elevated above 135/85 this visit, please have this repeated by your doctor within one month. --------------

## 2020-05-05 ENCOUNTER — Telehealth: Payer: Self-pay

## 2020-05-05 ENCOUNTER — Encounter: Payer: Self-pay | Admitting: Family Medicine

## 2020-05-05 ENCOUNTER — Ambulatory Visit (INDEPENDENT_AMBULATORY_CARE_PROVIDER_SITE_OTHER): Payer: PPO | Admitting: Family Medicine

## 2020-05-05 VITALS — BP 100/66 | HR 81 | Temp 98.1°F | Ht 65.0 in | Wt 184.5 lb

## 2020-05-05 DIAGNOSIS — F1010 Alcohol abuse, uncomplicated: Secondary | ICD-10-CM

## 2020-05-05 DIAGNOSIS — Z950 Presence of cardiac pacemaker: Secondary | ICD-10-CM | POA: Diagnosis not present

## 2020-05-05 DIAGNOSIS — M549 Dorsalgia, unspecified: Secondary | ICD-10-CM

## 2020-05-05 MED ORDER — PREDNISONE 20 MG PO TABS: ORAL_TABLET | ORAL | 0 refills | Status: DC

## 2020-05-05 MED ORDER — KETOROLAC TROMETHAMINE 60 MG/2ML IM SOLN
60.0000 mg | Freq: Once | INTRAMUSCULAR | Status: AC
  Administered 2020-05-05: 60 mg via INTRAMUSCULAR

## 2020-05-05 MED ORDER — OXYCODONE HCL 5 MG PO TABS: 5.0000 mg | ORAL_TABLET | Freq: Four times a day (QID) | ORAL | 0 refills | Status: DC | PRN

## 2020-05-05 MED ORDER — DEXAMETHASONE SODIUM PHOSPHATE 10 MG/ML IJ SOLN
10.0000 mg | Freq: Once | INTRAMUSCULAR | Status: AC
  Administered 2020-05-05: 10 mg via INTRAMUSCULAR

## 2020-05-05 NOTE — Telephone Encounter (Signed)
Patients wants to know if he should take his oxycodone at 2:45pm, today? Please call patient

## 2020-05-05 NOTE — Patient Instructions (Signed)
Follow-up in 3 weeks with either Dr. Junius Roads, [facility], or me

## 2020-05-05 NOTE — Telephone Encounter (Signed)
Called and spoke to patient. Per Dr. Lorelei Pont ok to take meds. Will call if any questions

## 2020-05-05 NOTE — Progress Notes (Signed)
Brandon Kalp T. Brandon Beyersdorf, MD, [place]  Primary Care and Sports Medicine [facility] at [facility] [place], [address]  Phone: [phone]  FAX: [phone]  Brandon Lopez - 65 y.o. male  MRN [id number]  Date of Birth: 1955-06-03  Date: 05/05/2020  PCP: Pleas Koch, NP  Referral: Pleas Koch, NP  Chief Complaint  Patient presents with  . Back Pain    X 2 weeks-seen at [facility] ER yesterday    This visit occurred during the SARS-CoV-2 public health emergency.  Safety protocols were in place, including screening questions prior to the visit, additional usage of staff PPE, and extensive cleaning of exam room while observing appropriate contact time as indicated for disinfecting solutions.   Subjective:   Brandon Lopez is a 65 y.o. very pleasant male patient with Body mass index is 30.7 kg/m. who presents with the following:  He presents with acute back pain x 2 weeks, who actually went to the ER yesterday for back pain. He required oxycodone, dilaudid injection x 2, zofran. He is here today with some continued severe pain.  AF with pacemaker, CHF, COPD.  Acute back pain after lifting a pile of wood.  Worried about back, CT from yesterday.  CT was essentially normal.  No evidence of vertebral fracture or any fracture whatsoever.  ? Neighbor brought some wood.  Unloaded it, and then a couple of days later he started to feel some severe back pain.  L lower postrior back.  No tingling going down his legs.  Legs were hurting some pain.  This is localized essentially to the L4-S1 region bilaterally.  No major back history.   He first went to [facility].  I gave him a steroid Dosepak. Saw Ronalee Belts [facility] 7 days ago.   Has taken some celebrex, tramadol, baclofen  Secondary history he does drink generally at least 4-5 beers daily, and sometimes upwards of 10 or more.  Sometimes last in the 1-2  region.  Today IM Toradol 60 mg IM Decadron 10 mg  Review of Systems is noted in the HPI, as appropriate   Objective:   BP 100/66   Pulse 81   Temp 98.1 F (36.7 C) (Temporal)   Ht 5\' 5"  (1.651 m)   Wt 184 lb 8 oz (83.7 kg)   SpO2 99%   BMI 30.70 kg/m    Range of motion at  the waist: Flexion, extension, lateral bending and rotation: He is limited to approximately 30 degrees of forward flexion.  Extension causes pain.  Lateral bending is still preserved but approximately 35% off of what would be expected.  No echymosis or edema Rises to examination table with mild difficulty Gait: minimally antalgic  Inspection/Deformity: N Paraspinus Tenderness: L3-S1 bilaterally  B Ankle Dorsiflexion (L5,4): 5/5 B Great Toe Dorsiflexion (L5,4): 5/5 Heel Walk (L5): WNL Toe Walk (S1): WNL Rise/Squat (L4): WNL, mild pain  SENSORY B Medial Foot (L4): WNL B Dorsum (L5): WNL B Lateral (S1): WNL Light Touch: WNL Pinprick: WNL  REFLEXES Knee (L4): 2+ Ankle (S1): 2+  B SLR, seated: Pain only  B Greater Troch: NT B Log Roll: neg B Sciatic Notch: NT   I limited my exam to above based on the patient's pain level.  Radiology: CT Lumbar Spine Wo Contrast  Result Date: 05/04/2020 CLINICAL DATA:  Low back pain EXAM: CT LUMBAR SPINE WITHOUT CONTRAST TECHNIQUE: Multidetector CT imaging of the lumbar spine was performed without  intravenous contrast administration. Multiplanar CT image reconstructions were also generated. COMPARISON:  None. FINDINGS: Segmentation: Normal Alignment: Normal Vertebrae: Negative for fracture or mass Paraspinal and other soft tissues: Atherosclerotic aorta and iliacs without aneurysm. No paraspinous mass or adenopathy. Disc levels: T12-L1: Negative L1-2: Mild disc bulging.  Negative for disc protrusion or stenosis L2-3: Negative L3-4: Mild disc bulging.  Negative for disc protrusion or stenosis. L4-5: Mild disc bulging and mild facet degeneration. Negative for disc  protrusion or stenosis. L5-S1: Negative IMPRESSION: Negative for fracture Mild lumbar degenerative change without neural impingement or stenosis. Electronically Signed   By: Franchot Gallo M.D.   On: 05/04/2020 12:58   XR Lumbar Spine 2-3 Views  Result Date: 05/01/2020 X-rays lumbar spine compared to July x-rays show no obvious compression fracture.  He does have mild to moderate disc disease at the lower levels and facet arthropathy.  No sign of neoplasm.   Assessment and Plan:     ICD-10-CM   1. Acute back pain less than 4 weeks duration  M54.9 ketorolac (TORADOL) injection 60 mg    dexamethasone (DECADRON) injection 10 mg  2. Alcohol abuse  F10.10   3. Pacemaker  Z95.0    Recalcitrant acute back pain.  I did give the patient some Toradol as well as Decadron in the office today with some steroids to start tomorrow orally.  In this patient who has severe pain I think some pain medicine is reasonable, but I went over in great detail potential risk with alcohol use.  He thinks that he can limit his drinking to 0-1 and possibly 1 or 2 maximally daily.  I went over all of this with his wife 2, and she will help to manage this.  Also reviewed with him that combining multiple drinks of alcohol with pain medicine can lead to respiratory depression and death.  He has been to work on just basic motion for now.  Patient Instructions  Follow-up in 3 weeks with either Dr. Junius Roads, [facility], or me    Meds ordered this encounter  Medications  . predniSONE (DELTASONE) 20 MG tablet    Sig: 2 tabs po daily for 5 days, then 1 tab po daily for 5 days    Dispense:  15 tablet    Refill:  0  . oxyCODONE (OXY IR/ROXICODONE) 5 MG immediate release tablet    Sig: Take 1 tablet (5 mg total) by mouth every 6 (six) hours as needed for severe pain.    Dispense:  20 tablet    Refill:  0    Follow-up script refill for severe pain  . ketorolac (TORADOL) injection 60 mg  . dexamethasone (DECADRON)  injection 10 mg   Medications Discontinued During This Encounter  Medication Reason  . methylPREDNISolone (MEDROL DOSEPAK) 4 MG TBPK tablet Completed Course  . varenicline (CHANTIX) 1 MG tablet Patient Preference  . oxyCODONE (OXY IR/ROXICODONE) 5 MG immediate release tablet Reorder   No orders of the defined types were placed in this encounter.   Follow-up: Return in about 3 weeks (around 05/26/2020).  Signed,  Maud Deed. Akshita Italiano, MD   Outpatient Encounter Medications as of 05/05/2020  Medication Sig  . albuterol (PROAIR HFA) 108 (90 Base) MCG/ACT inhaler Inhale 2 puffs into the lungs every 6 (six) hours as needed for wheezing or shortness of breath.  Marland Kitchen atorvastatin (LIPITOR) 20 MG tablet Take 1 tablet (20 mg total) by mouth every evening. For cholesterol  . baclofen (LIORESAL) 10 MG tablet Take 0.5-1  tablets (5-10 mg total) by mouth 3 (three) times daily as needed for muscle spasms.  . carvedilol (COREG) 3.125 MG tablet Take 1 tablet by mouth twice daily  . dapagliflozin propanediol (FARXIGA) 10 MG TABS tablet Take 1 tablet (10 mg total) by mouth daily.  Marland Kitchen ezetimibe (ZETIA) 10 MG tablet Take 1 tablet by mouth once daily  . loratadine (CLARITIN) 10 MG tablet Take 10 mg by mouth daily as needed for allergies.   . predniSONE (DELTASONE) 20 MG tablet 2 tabs po daily for 5 days, then 1 tab po daily for 5 days  . SYMBICORT 160-4.5 MCG/ACT inhaler Inhale 2 puffs by mouth twice daily  . telmisartan-hydrochlorothiazide (MICARDIS HCT) 40-12.5 MG tablet Take 1 tablet by mouth once daily for blood pressure  . [DISCONTINUED] methylPREDNISolone (MEDROL DOSEPAK) 4 MG TBPK tablet Take by mouth as directed.  . [DISCONTINUED] oxyCODONE (OXY IR/ROXICODONE) 5 MG immediate release tablet Take 1 tablet (5 mg total) by mouth every 6 (six) hours as needed for severe pain.  . [DISCONTINUED] varenicline (CHANTIX) 1 MG tablet Take 1/2 tablet by mouth once daily x 3 days, then 1/2 tablet twice daily x 4 days,  then 1 tablet twice daily thereafter.  . celecoxib (CELEBREX) 100 MG capsule Take 1 capsule (100 mg total) by mouth 2 (two) times daily as needed. (Patient not taking: Reported on 05/05/2020)  . lidocaine (LIDODERM) 5 % 1 patch daily. (Patient not taking: Reported on 05/05/2020)  . methocarbamol (ROBAXIN) 500 MG tablet Take 1 tablet by mouth 2 (two) times daily as needed. (Patient not taking: Reported on 05/05/2020)  . oxyCODONE (OXY IR/ROXICODONE) 5 MG immediate release tablet Take 1 tablet (5 mg total) by mouth every 6 (six) hours as needed for severe pain.  . [EXPIRED] dexamethasone (DECADRON) injection 10 mg   . [EXPIRED] ketorolac (TORADOL) injection 60 mg    No facility-administered encounter medications on file as of 05/05/2020.

## 2020-05-05 NOTE — Addendum Note (Signed)
Addended by: Ernie Hew D on: 05/05/2020 09:06 AM   Modules accepted: Orders

## 2020-05-06 ENCOUNTER — Telehealth: Payer: Self-pay

## 2020-05-06 NOTE — Telephone Encounter (Signed)
Mr. Brandon Lopez notified as instructed by telephone.  He states that the prednisone is making him nauseous and wants to know what he can do to help settle that down.  Please advise.

## 2020-05-06 NOTE — Telephone Encounter (Signed)
Pt left v/m that pt saw Dr Lorelei Pont on 05/05/20 and pt forgot to ask if pt should continue taking the celebrex. Pt request cb after reviewed by Dr Lorelei Pont.

## 2020-05-06 NOTE — Telephone Encounter (Signed)
Decrease the dose to only 1 tablet a day, and that should help.  Basic things like ginger ale may help.  I don't want to give him any additional prescription medications.

## 2020-05-06 NOTE — Telephone Encounter (Signed)
Left message for Brandon Lopez to decrease the prednisone dose to only 1 tablet a day, and that should help.  Also Basic things like ginger ale may also help.  I ask that he call us back if he has any questions.

## 2020-05-06 NOTE — Telephone Encounter (Signed)
Please call  Not while on prednisone, but then I would restart it.

## 2020-05-07 ENCOUNTER — Telehealth: Payer: Self-pay

## 2020-05-07 NOTE — Telephone Encounter (Signed)
If the [place] is denied, we can plan to switch to [place].   Thanks!

## 2020-05-07 NOTE — Telephone Encounter (Signed)
Fax received from [email or web address]-Prior Authorization required for Farxiga 10mg  tablets (new start-Dx: mild cardiomyopathy per Dr. Olin Pia 04/29/20 office note). KEY: [id number] Clinical questions completed however plan does state Jardiance is on formulary. [employer] processing request-waiting for determination.

## 2020-05-07 NOTE — Telephone Encounter (Addendum)
Incoming fax from [employer] is approved.  Approved through 01/24/2021  Pharmacy notified - $200 for a 3 month supply

## 2020-05-09 ENCOUNTER — Telehealth: Payer: Self-pay | Admitting: Internal Medicine

## 2020-05-09 NOTE — Telephone Encounter (Signed)
I called and spoke with the patient regarding the cost of Farxiga. Per the patient, this is going to cost him $200/ month. I have advised the patient that per Dr. Caryl Comes, he has found out that Jardiance 10 mg once daily is the preferred drug on most insurance plans, and therefore we could try switching him to this in place of the [place].  Per the patient, he is undergoing a work up for back pain right now and is being given opioids and steroids.  He is concerned right now about adding an additional drug. I have advised him that he can call us back when his back pain and medication regimen for that is under more control/ stable and we can look to start Jardiance.  The patient is scheduled to follow up with Dr. Rockey Situ on 07/29/20.   The patient is aware I will forward to Dr. Rockey Situ to see if he has any further recommendations or ok to just keep the 07/29/20 appointment.  The patient is aware I will also message Dr. Caryl Comes, but is advised Dr. Caryl Comes will be out of the office for the next 3 weeks.  The patient voices understanding and is agreeable.

## 2020-05-09 NOTE — Telephone Encounter (Signed)
Please call to discuss Brandon Lopez. States it is not affordable through his insurance. Please call to discuss. Patient states he would like a call back ASAP, due to his girlfriend picking up his free samples, but doesn't want to get if he is not going to be able to afford.

## 2020-05-11 NOTE — Telephone Encounter (Signed)
Perhaps we can ask our pharmacy for assistance Patient may need to call his insurance and see what his insurance will cover. Perhaps jardiance 10 mg daily instead

## 2020-05-12 ENCOUNTER — Telehealth: Payer: Self-pay | Admitting: *Deleted

## 2020-05-12 NOTE — Telephone Encounter (Signed)
Patient left a voicemail stating that he saw Dr. Lorelei Pont last week. Patient stated that there has not been any improvement in his back pain since his visit. Patient wants to know what else he should do. Pharmacy [facility]

## 2020-05-12 NOTE — Telephone Encounter (Signed)
Mr. Brandon Lopez notified as instructed by telephone.  Patient states understanding.

## 2020-05-12 NOTE — Telephone Encounter (Signed)
Can you call  I am not sure there really is much additional to do right now.  He may want to follow-up with either [facility] or Dr. Junius Roads.  They saw him a couple of times before he saw me.  Possible they might have more suggestions.

## 2020-05-13 ENCOUNTER — Ambulatory Visit (INDEPENDENT_AMBULATORY_CARE_PROVIDER_SITE_OTHER): Payer: PPO | Admitting: Family Medicine

## 2020-05-13 ENCOUNTER — Other Ambulatory Visit: Payer: Self-pay

## 2020-05-13 ENCOUNTER — Other Ambulatory Visit: Payer: Self-pay | Admitting: *Deleted

## 2020-05-13 ENCOUNTER — Encounter: Payer: Self-pay | Admitting: Family Medicine

## 2020-05-13 DIAGNOSIS — M545 Low back pain, unspecified: Secondary | ICD-10-CM | POA: Diagnosis not present

## 2020-05-13 DIAGNOSIS — Z87891 Personal history of nicotine dependence: Secondary | ICD-10-CM

## 2020-05-13 DIAGNOSIS — F17210 Nicotine dependence, cigarettes, uncomplicated: Secondary | ICD-10-CM

## 2020-05-13 MED ORDER — OXYCODONE HCL 5 MG PO TABS: 5.0000 mg | ORAL_TABLET | Freq: Four times a day (QID) | ORAL | 0 refills | Status: DC | PRN

## 2020-05-13 NOTE — Telephone Encounter (Signed)
I don't see new insurance card on file, but with [employer] Plan I, Jardiance 10mg  is the prefer brand Tier 3 with $45 co-pay for 30 day supply or $90 for 90 day mail order.  Have option to apply for patient assistance as well.  [email or web address]

## 2020-05-13 NOTE — Progress Notes (Signed)
Office Visit Note   Patient: Brandon Lopez           Date of Birth: 03/31/1955           MRN: [id number] Visit Date: 05/13/2020 Requested by: Pleas Koch, NP [place],  [place] [address] PCP: Pleas Koch, NP  Subjective: Chief Complaint  Patient presents with  . Lower Back - Pain    No improvement in his pain. Almost finished with the steroids, and almost out of the opioids. "I'm at the end of my rope."     HPI: He is here with persistent back pain.  Since last visit he went to the ER.  CT scan was negative for acute abnormality.  I reviewed the images myself.  He was given stronger narcotics.  He went to Dr. Edilia Bo as well, more medicines were given.  He was warned about the dangers of combining narcotics with alcohol, and he has been adjusting his intake accordingly.  Pain is much worse when bending forward, much better when sitting.  No radicular symptoms.              ROS:   All other systems were reviewed and are negative.  Objective: Vital Signs: There were no vitals taken for this visit.  Physical Exam:  General:  Alert and oriented, in no acute distress. Pulm:  Breathing unlabored. Psy:  Normal mood, congruent affect. Skin: No rash Low back: He has tight and very tender paraspinous muscles from around L4-S1 bilaterally.  Palpation of the muscles seems to reproduce his pain.  Imaging: No results found.  Assessment & Plan: 1.  Persistent low back pain, suspect myofascial. -Discussed options and elected to inject the symptomatic trigger points today.  Referred him to [facility] for myofascial release techniques.  Refilled oxycodone to use sparingly. -Could contemplate coordinating with his cardiologist to order MRI scan if he fails to improve.     Procedures: Bilateral lumbar paraspinous muscle trigger point injections: After sterile prep with Betadine, injected 5 cc 0.25% bupivacaine and 20 mg Depo-Medrol into each side.  He  had very good relief during the anesthetic phase.       PMFS History: Patient Active Problem List   Diagnosis Date Noted  . Chronic back pain 08/17/2019  . Chronic shoulder pain 12/12/2018  . Rectal bleeding 09/19/2018  . Paroxysmal atrial fibrillation ([place]) 10/31/2017  . Syncope 08/10/2017  . Osteoarthritis 03/17/2017  . Edema 07/21/2016  . Cigarette smoker 11/17/2015  . Diverticulosis 12/16/2014  . Chronic obstructive pulmonary disease ([place]) 11/26/2014  . Cervical pain (neck) 10/14/2014  . Hyperlipidemia 07/11/2014  . Preventative health care 07/11/2014  . Pacemaker 06/03/2014  . Anxiety and depression 06/03/2014  . Alcohol abuse 06/03/2014  . OSA (obstructive sleep apnea) 06/03/2014  . Essential hypertension 06/03/2014  . Tobacco abuse 06/03/2014   Past Medical History:  Diagnosis Date  . Arthritis   . Chronic systolic CHF (congestive heart failure) ([place])    a. echo 6/16: EF 55-60%, Gr1DD, LA nl, PASP nl; b. echo 7/19: EF 40-45%, unable to exclude RWMA, Gr1DD, LA nl, RVSF nl, PASP nl  . COPD (chronic obstructive pulmonary disease) ([place])   . Depression   . Diverticulosis    Found during colonoscopy  . Hypertension   . Kidney stones   . PAF (paroxysmal atrial fibrillation) ([place])    a. short episodes previously noted on PPM interrogation; b. started on Xarelto 07/2017 given further episodes of Afib  noted during interrogation; c. CHADS2VASc at least 3 (CHF, HTN, vascualr disease with aortic calcifications noted on CT)  . 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 Presence of permanent cardiac pacemaker    a. Biotronik; Dayna Barker DR-T [id number]; placed 08/2012  . Sleep apnea   . Symptomatic bradycardia    a. s/p Biotronik; Dayna Barker DR-T [id number]; placed 08/2012 at [facility] History  Problem Relation Age of Onset  . Arthritis Mother   . Heart disease Father   . Hyperlipidemia Father   . Hypertension Father   . Cancer Paternal Grandfather     Past Surgical History:  Procedure Laterality  Date  . APPENDECTOMY  age 36  . CARDIAC CATHETERIZATION     7 years ago  . COLONOSCOPY N/A 05/27/2014   Procedure: COLONOSCOPY;  Surgeon: Hulen Luster, MD;  Location: [facility] ENDOSCOPY;  Service: Gastroenterology;  Laterality: N/A;  . INSERT / REPLACE / REMOVE PACEMAKER     3 years ago   Social History   Occupational History  . Not on file  Tobacco Use  . Smoking status: Heavy Tobacco Smoker    Packs/day: 2.00    Years: 50.00    Pack years: 100.00    Types: Cigarettes    Start date: 1964  . Smokeless tobacco: Never Used  . Tobacco comment: started smoking at age 8, 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  Vaping Use  . Vaping Use: Never used  Substance and Sexual Activity  . Alcohol use: Yes    Alcohol/week: 8.0 - 14.0 standard drinks    Types: 8 - 14 Cans of beer per week  . Drug use: Yes    Types: Marijuana  . Sexual activity: Not on file

## 2020-05-14 ENCOUNTER — Telehealth: Payer: Self-pay | Admitting: Internal Medicine

## 2020-05-14 DIAGNOSIS — M545 Low back pain, unspecified: Secondary | ICD-10-CM | POA: Diagnosis not present

## 2020-05-14 NOTE — Telephone Encounter (Signed)
Patient states he is returning the call to "jennifer".

## 2020-05-14 NOTE — Telephone Encounter (Signed)
Spoke with the patient. Patient made aware of Dr. Donivan Scull recommendation regarding the alternative medication Jardiance 10 mg daily.  Patient also made aware of [facility] Pharm[facility] response regarding the patients out of pocket copay for Jardiance. Patient sts that he does still have his prescription coverage with [employer].  Patient sts that he does not want to start any new medications at this time. He is currently undergoing treatment for back pain and wants to complete that first. Patient sts that he will call back to update both Dr. Caryl Comes and Dr. Rockey Situ when he is ready to start Jardiance. Adv the patient that I will fwd both MDs the update.

## 2020-05-14 NOTE — Telephone Encounter (Signed)
No answer. Left message to call back.   

## 2020-05-14 NOTE — Telephone Encounter (Signed)
Duplicate encounter see 05/09/20 for documentation.

## 2020-05-15 ENCOUNTER — Telehealth: Payer: Self-pay

## 2020-05-15 NOTE — Chronic Care Management (AMB) (Addendum)
Chronic Care Management Pharmacy Assistant   Name: Brandon Lopez  MRN: [id number] DOB: Jun 07, 1955   Reason for Encounter: Disease State - HTN   Conditions to be addressed/monitored: HTN  Recent office visits:  05/05/2020 - Dr.Spencer Copland - Prednisone course, decadron, toradol injection  04/23/2020 - Dr.Spencer Copland - Cortisone injection   Recent consult visits:  05/13/2020  Dr.Michae Hilts, Orthopedics - Trigger point injection  05/01/2020   Dr.Michael Hilts, Orthopedics - Start Baclofen 5-10mg  - take 1 tablet 3 times a day prn, Celebrex 100mg  - take 1 tablet 2 times a day prn, tramadol 50 mg - take 1 tablet every 6 hours prn. 04/29/2020  Dr.Steven Caryl Comes, Cardiology - Start Farxiga 10 mg daily   Hospital visits:  ED visit - 05/04/2020 due to acute Back Pain - oxycodone 5 mg every 6 hours as needed  Medications: Outpatient Encounter Medications as of 05/15/2020  Medication Sig   albuterol (PROAIR HFA) 108 (90 Base) MCG/ACT inhaler Inhale 2 puffs into the lungs every 6 (six) hours as needed for wheezing or shortness of breath.   atorvastatin (LIPITOR) 20 MG tablet Take 1 tablet (20 mg total) by mouth every evening. For cholesterol   baclofen (LIORESAL) 10 MG tablet Take 0.5-1 tablets (5-10 mg total) by mouth 3 (three) times daily as needed for muscle spasms.   carvedilol (COREG) 3.125 MG tablet Take 1 tablet by mouth twice daily   celecoxib (CELEBREX) 100 MG capsule Take 1 capsule (100 mg total) by mouth 2 (two) times daily as needed. (Patient not taking: Reported on 05/05/2020)   dapagliflozin propanediol (FARXIGA) 10 MG TABS tablet Take 1 tablet (10 mg total) by mouth daily.   ezetimibe (ZETIA) 10 MG tablet Take 1 tablet by mouth once daily   lidocaine (LIDODERM) 5 % 1 patch daily. (Patient not taking: Reported on 05/05/2020)   loratadine (CLARITIN) 10 MG tablet Take 10 mg by mouth daily as needed for allergies.    methocarbamol (ROBAXIN) 500 MG tablet Take 1 tablet by  mouth 2 (two) times daily as needed. (Patient not taking: Reported on 05/05/2020)   oxyCODONE (OXY IR/ROXICODONE) 5 MG immediate release tablet Take 1 tablet (5 mg total) by mouth every 6 (six) hours as needed for severe pain.   predniSONE (DELTASONE) 20 MG tablet 2 tabs po daily for 5 days, then 1 tab po daily for 5 days   SYMBICORT 160-4.5 MCG/ACT inhaler Inhale 2 puffs by mouth twice daily   telmisartan-hydrochlorothiazide (MICARDIS HCT) 40-12.5 MG tablet Take 1 tablet by mouth once daily for blood pressure   No facility-administered encounter medications on file as of 05/15/2020.    Recent Office Vitals: BP Readings from Last 3 Encounters:  05/05/20 100/66  05/04/20 (!) 119/93  05/04/20 100/71   Pulse Readings from Last 3 Encounters:  05/05/20 81  05/04/20 73  05/04/20 81    Wt Readings from Last 3 Encounters:  05/05/20 184 lb 8 oz (83.7 kg)  04/29/20 189 lb (85.7 kg)  04/23/20 189 lb 8 oz (86 kg)     Kidney Function Lab Results  Component Value Date/Time   CREATININE 0.74 05/04/2020 02:45 PM   CREATININE 0.76 01/11/2020 12:27 PM   CREATININE 0.75 02/13/2014 04:35 AM   CREATININE 0.74 02/12/2014 01:54 PM   GFR 95.10 01/11/2020 12:27 PM   GFRNONAA >60 05/04/2020 02:45 PM   GFRNONAA >60 02/13/2014 04:35 AM   GFRAA >60 08/09/2017 08:38 PM   GFRAA >60 02/13/2014 04:35 AM  BMP Latest Ref Rng & Units 05/04/2020 01/11/2020 12/12/2018  Glucose 70 - 99 mg/dL 102(H) 92 85  BUN 8 - 23 mg/dL 11 9 11   Creatinine 0.61 - 1.24 mg/dL 0.74 0.76 0.75  Sodium 135 - 145 mmol/L 135 134(L) 137  Potassium 3.5 - 5.1 mmol/L 3.7 3.6 4.3  Chloride 98 - 111 mmol/L 101 100 103  CO2 22 - 32 mmol/L 28 27 26   Calcium 8.9 - 10.3 mg/dL 8.3(L) 9.1 9.2    Current antihypertensive regimen:   Carvedilol 3.125 mg - 1 tablet twice daily  Telmisartan-HCTZ 40-12.5 mg - 1 tablet daily    Patient verbally confirms he is taking the above medications as directed. Yes  How often are you checking your  Blood Pressure? infrequently   The patient states this past month he has had severe back pain and has not checked his BP like he normally does.  Current home BP readings:   DATE:             BP               PULSE 05/04/2020  119/93  73 05/04/2020  100/71  81 05/05/2020  100/66  81 05/06/2020  132/80  -  Wrist or arm cuff:  Arm cuff Caffeine intake:  The patient states he drinks 3 cups of coffee every morning, 1 caffiene free soda a day. Salt intake:  The patient states he limits salt with cooking   OTC medications including pseudoephedrine or NSAIDs? no  Any readings above 180/120? No  What recent interventions/DTPs have been made by any provider to improve Blood Pressure control since last CPP Visit:  None identified  Any recent hospitalizations or ED visits since last visit with CPP? Yes - 05/04/2020  ED visit, back pain  What diet changes have been made to improve Blood Pressure Control?   None identified  What exercise is being done to improve your Blood Pressure Control?   The patient is currently doing PT for his back pain.  Adherence Review: Is the patient currently on ACE/ARB medication? Yes Does the patient have >5 day gap between last estimated fill dates? No gaps in adherence   Star Rating Drugs:  Medication:  Last Fill: Day Supply Atorvastatin 20mg  03/26/2020 90ds Farxiga 10mg .  05/07/2020 90ds telmisartan 40-12.5 mg.  03/25/2020      90ds   Follow-Up:  Pharmacist Review  [name], CPP notified  [name], [place] Assistant [phone]  I have reviewed the care management and care coordination activities outlined in this encounter and I am certifying that I agree with the content of this note. No further action required.  [name], PharmD Clinical Pharmacist [facility] [phone]

## 2020-05-16 ENCOUNTER — Telehealth: Payer: Self-pay | Admitting: Family Medicine

## 2020-05-16 DIAGNOSIS — M545 Low back pain, unspecified: Secondary | ICD-10-CM

## 2020-05-16 NOTE — Telephone Encounter (Signed)
Patient called requesting a call from Dr. Junius Roads. He states he was doing ok until yesterday. He states his symptoms have changed and really need to speak with the doctor about his concerns and if Dr. Junius Roads can call him for a few minutes to discuss. Please call patient about this matter at [phone].

## 2020-05-16 NOTE — Telephone Encounter (Signed)
Patient called stating that he was doing better a couple days ago, but then took a turn for the worse and now he has posterior hip pain radiating down to to the foot and giving him a cold sensation in his foot.  Oxycodone is not helping his pain.  I will contact his cardiologist to see whether it is possible to order an MRI scan since patient has a pacemaker.  If not, then we might just proceed with a therapeutic epidural steroid injection.

## 2020-05-19 ENCOUNTER — Telehealth: Payer: Self-pay | Admitting: Family Medicine

## 2020-05-19 NOTE — Telephone Encounter (Signed)
I have left him a message but he has not responded yet.

## 2020-05-19 NOTE — Telephone Encounter (Signed)
Sometimes the second ESI works better than the first, but it's hard to predict.

## 2020-05-19 NOTE — Telephone Encounter (Signed)
I called the patient and advised him we are awaiting a response from the cardiologist.   He said that over the weekend, he was sitting on the porch with the right leg elevated -- "something pulled my toes back" and "my whole leg went numb." He thought he was going to have to go back to the ED. He switched over to taking Advil 3 every 8 hours -- this dulls the pain to where it is "bearable." His ESI with Dr.  [name] has been scheduled for 05/29/20. The patient asks that if the [facility] may last only 8-10 hours, is it worth doing it? The Advil is helping, but he thinks he is taking more than is recommended for him.

## 2020-05-19 NOTE — Telephone Encounter (Signed)
Pt called wanting to know if Dr. Junius Roads was able to get in touch with his cardiologist? He would like a CB to be updated please.   [phone]

## 2020-05-19 NOTE — Telephone Encounter (Signed)
I called and advised the patient. He does have time to decide if he wants to go through with the [facility] or not -- he will call if he wishes to cancel it. The patient said he will be calling his cardiologist in the morning about the possibility of having an MRI.

## 2020-05-20 ENCOUNTER — Telehealth: Payer: Self-pay | Admitting: Internal Medicine

## 2020-05-20 NOTE — Telephone Encounter (Signed)
Returned patients phone call. Patient states he would like to have an MRI and would like to know if his device is compatible. Advised patient his device is not compatible per [facility] system check ([employer]). Patient is very concerned about his next step if he is not able to have an MRI (back pain). Advised patient I will forward to Dr. Caryl Comes for review to see if Dr. Caryl Comes has any suggestions in regard to device/MRI? Patient agreeable to plan.

## 2020-05-20 NOTE — Telephone Encounter (Signed)
  1. Has your device fired? no  2. Is you device beeping? no  3. Are you experiencing draining or swelling at device site? no  4. Are you calling to see if we received your device transmission? no  5. Have you passed out? No  PATIENT CALLING TO DISCUSS PROCESS FOR GETTING DEVICE CLEARANCE ON AN MRI ORDERED BY DR HILTS     Please route to [place]

## 2020-05-21 ENCOUNTER — Telehealth: Payer: Self-pay

## 2020-05-21 MED ORDER — MELOXICAM 15 MG PO TABS: 7.5000 mg | ORAL_TABLET | Freq: Every day | ORAL | 6 refills | Status: DC | PRN

## 2020-05-21 NOTE — Telephone Encounter (Signed)
Please advise 

## 2020-05-21 NOTE — Addendum Note (Signed)
Addended by: Hortencia Pilar on: 05/21/2020 04:20 PM   Modules accepted: Orders

## 2020-05-21 NOTE — Telephone Encounter (Signed)
The patient called back: advised him of the medication change to meloxicam, to take 1/2 - 1 daily with food. Advised him to let us know if he has any issues with this new medication.

## 2020-05-21 NOTE — Telephone Encounter (Signed)
I tried calling the patient - the call could not go through. Will try again later.

## 2020-05-21 NOTE — Telephone Encounter (Signed)
Patient called he stated he has been taking ibuprofen for his back pain he also stated he stopped taking the opioids he is requesting a different rx similar to ibuprofen that doesn't cause issues with his stomach call back:[phone]

## 2020-05-21 NOTE — Telephone Encounter (Signed)
Meloxicam Rx sent

## 2020-05-22 NOTE — Telephone Encounter (Signed)
FYI

## 2020-05-22 NOTE — Telephone Encounter (Signed)
Calling patient back to let him know Dr. Caryl Comes has not respond yet. Attempted to to call patient but was no answer. Unable to leave VM.

## 2020-05-22 NOTE — Telephone Encounter (Signed)
Please see phone note on 05/20/20. We are waiting on a response from Dr. Caryl Comes. He is currently out of town.

## 2020-05-22 NOTE — Telephone Encounter (Signed)
Patient calling to check on status.

## 2020-05-22 NOTE — Telephone Encounter (Signed)
Routed to [place], [place]

## 2020-05-22 NOTE — Telephone Encounter (Signed)
Returned call to patient who states that he has been experiencing back pain, and had testing that has now lead to needing an MRI on his back. Patient would like to know if he can do this with his device.   Advised patient that I would forward message to [facility] and Dr. Caryl Comes for them to review.   Patient verbalized understanding.

## 2020-05-22 NOTE — Telephone Encounter (Signed)
PT is calling back with additional questions.Please Advise

## 2020-05-23 NOTE — Telephone Encounter (Signed)
Ok, please notify pt.

## 2020-05-23 NOTE — Telephone Encounter (Signed)
I called the patient, reaching his voice mail -- advised him that Dr. Caryl Comes is out-of-the office -- hopefully, that office will be contacting him soon with an answer about the Lsp MRI possibility.

## 2020-05-24 NOTE — Telephone Encounter (Signed)
Thanks SK   

## 2020-05-24 NOTE — Telephone Encounter (Signed)
Have sent text contact info for [facility] MRI device clijnic referral  Thanks SK

## 2020-05-26 ENCOUNTER — Other Ambulatory Visit: Payer: Self-pay | Admitting: Primary Care

## 2020-05-27 ENCOUNTER — Ambulatory Visit: Payer: PPO | Admitting: Primary Care

## 2020-05-27 ENCOUNTER — Telehealth: Payer: Self-pay | Admitting: Physical Medicine and Rehabilitation

## 2020-05-27 NOTE — Telephone Encounter (Signed)
Pt called stating he would like a CB sometime today if possible to explain the benefits of the epidural injection.   [phone]

## 2020-05-27 NOTE — Telephone Encounter (Signed)
Called pt and cancel his appt due to him not wanting the inj.

## 2020-05-28 ENCOUNTER — Ambulatory Visit: Payer: PPO | Admitting: Family Medicine

## 2020-05-28 NOTE — Telephone Encounter (Signed)
Spoke with pt and advised an inbasket message has been sent to Dr Junius Roads with information from Dr Caryl Comes for a contact at [facility] re: scheduling MRI d/t PPM incompatibility.  Pt states he will contact Dr Junius Roads office to follow up.  Pt thanked Dr Caryl Comes and RN for the help.  Contact information is as follows:  [email or web address] - Send pt's info and type of MRI needed.

## 2020-05-29 ENCOUNTER — Ambulatory Visit: Payer: PPO | Admitting: Physical Medicine and Rehabilitation

## 2020-06-03 ENCOUNTER — Other Ambulatory Visit: Payer: Self-pay | Admitting: Family Medicine

## 2020-06-03 ENCOUNTER — Telehealth: Payer: Self-pay

## 2020-06-03 DIAGNOSIS — M545 Low back pain, unspecified: Secondary | ICD-10-CM

## 2020-06-03 DIAGNOSIS — Z950 Presence of cardiac pacemaker: Secondary | ICD-10-CM

## 2020-06-03 NOTE — Telephone Encounter (Signed)
I called - the patient is aware that he is being referred to [facility] Cardiac Electrophysiology. They will be contacting the patient to set up an appointment.

## 2020-06-03 NOTE — Telephone Encounter (Signed)
Per secure chat correspondence this morning between Dr. Caryl Comes Dr. Hilts/ Leigh (Device RN)/ & myself, the most recent update regarding the patient's MRI is:  - Brandon Lopez has reached out to [facility] to see if they would be able to do an MRI with the patient's device - Per [facility], this is possible if the patient has a complete system, which Brandon Lopez has confirmed he does  - Dr. Caryl Comes notified Dr. Junius Roads we were trying to help arrange  - Dr. Junius Roads acknowledged this (confirmed he needs lumbar spine MRI)  - Per Leigh, Dr. Junius Roads will most likely need to send the order then [facility] will reach out to our [facility] with a device clearance form to be completed.   - I have messaged Dr. Junius Roads and asked if he could please place the order and send this to [facility] so this will trigger the Device Clearance form being sent to our Device team  - Dr. Junius Roads advised he would work on getting the order to [facility]  I have called and notified the patient of the above conversation. I have asked him to touch base with Dr. Junius Roads office later on Thursday to find out where things are in the process.  The patient voices understanding and is agreeable. He was very appreciative of the call back.

## 2020-06-03 NOTE — Telephone Encounter (Signed)
patient called regarding mri patient stated he was supposed to be receiving a call back regarding whether or not he is receiving a mri call [phone]

## 2020-06-03 NOTE — Telephone Encounter (Signed)
Patient calling back in after calling Dr. Junius Roads office. States that the office had no clue what he was calling about or referring to. Please advise on referral status

## 2020-06-03 NOTE — Telephone Encounter (Signed)
Attempted phone call to pt.  Left voicemail message to contact RN at [phone]. 

## 2020-06-05 ENCOUNTER — Telehealth: Payer: Self-pay | Admitting: Family Medicine

## 2020-06-05 NOTE — Telephone Encounter (Signed)
I called the patient: this MRI is to be done at [facility] - referral still pending. The pain is more bearable with the meloxicam, but he is concerned because his leg is giving way, causing him to fall. Advised him the referral has been sent (today). He should be receiving a call from [facility] to schedule an appointment. Advised him to call us back if he has any more concerns/questions.

## 2020-06-05 NOTE — Telephone Encounter (Signed)
Pt called and state he is suppose to be having an MRI for his back and he states he has been waiting for like "7 weeks." I told him I only seen a referral for cardiology and he states that's not what he needs, he needs an MRI.  CB [phone]

## 2020-06-09 DIAGNOSIS — G4733 Obstructive sleep apnea (adult) (pediatric): Secondary | ICD-10-CM | POA: Diagnosis not present

## 2020-06-09 DIAGNOSIS — E785 Hyperlipidemia, unspecified: Secondary | ICD-10-CM | POA: Diagnosis not present

## 2020-06-09 DIAGNOSIS — Z950 Presence of cardiac pacemaker: Secondary | ICD-10-CM | POA: Diagnosis not present

## 2020-06-09 DIAGNOSIS — Z7982 Long term (current) use of aspirin: Secondary | ICD-10-CM | POA: Diagnosis not present

## 2020-06-09 DIAGNOSIS — F17210 Nicotine dependence, cigarettes, uncomplicated: Secondary | ICD-10-CM | POA: Diagnosis not present

## 2020-06-09 DIAGNOSIS — R9431 Abnormal electrocardiogram [ECG] [EKG]: Secondary | ICD-10-CM | POA: Diagnosis not present

## 2020-06-09 DIAGNOSIS — I110 Hypertensive heart disease with heart failure: Secondary | ICD-10-CM | POA: Diagnosis not present

## 2020-06-09 DIAGNOSIS — R200 Anesthesia of skin: Secondary | ICD-10-CM | POA: Diagnosis not present

## 2020-06-09 DIAGNOSIS — I495 Sick sinus syndrome: Secondary | ICD-10-CM | POA: Diagnosis not present

## 2020-06-09 DIAGNOSIS — Z45018 Encounter for adjustment and management of other part of cardiac pacemaker: Secondary | ICD-10-CM | POA: Diagnosis not present

## 2020-06-09 DIAGNOSIS — J449 Chronic obstructive pulmonary disease, unspecified: Secondary | ICD-10-CM | POA: Diagnosis not present

## 2020-06-09 DIAGNOSIS — M545 Low back pain, unspecified: Secondary | ICD-10-CM | POA: Diagnosis not present

## 2020-06-09 DIAGNOSIS — I480 Paroxysmal atrial fibrillation: Secondary | ICD-10-CM | POA: Diagnosis not present

## 2020-06-09 DIAGNOSIS — I2510 Atherosclerotic heart disease of native coronary artery without angina pectoris: Secondary | ICD-10-CM | POA: Diagnosis not present

## 2020-06-09 DIAGNOSIS — I509 Heart failure, unspecified: Secondary | ICD-10-CM | POA: Diagnosis not present

## 2020-06-10 ENCOUNTER — Ambulatory Visit (INDEPENDENT_AMBULATORY_CARE_PROVIDER_SITE_OTHER): Payer: PPO

## 2020-06-10 DIAGNOSIS — I495 Sick sinus syndrome: Secondary | ICD-10-CM | POA: Diagnosis not present

## 2020-06-11 LAB — CUP PACEART REMOTE DEVICE CHECK
Battery Remaining Percentage: 45 %
Date Time Interrogation Session: 20220517163013
Implantable Lead Implant Date: 20140801
Implantable Lead Implant Date: 20140801
Implantable Lead Location: 753859
Implantable Lead Location: 753860
Implantable Lead Model: 362
Implantable Lead Serial Number: 29356730
Implantable Lead Serial Number: 29392661
Implantable Pulse Generator Implant Date: 20140801
Lead Channel Impedance Value: 468 Ohm
Lead Channel Impedance Value: 527 Ohm
Lead Channel Sensing Intrinsic Amplitude: 1.5 mV
Lead Channel Sensing Intrinsic Amplitude: 3.8 mV
Lead Channel Setting Pacing Amplitude: 1.7 V
Lead Channel Setting Pacing Amplitude: 2.4 V
Lead Channel Setting Pacing Pulse Width: 0.4 ms
Lead Channel Setting Sensing Sensitivity: 2.5 mV
Pulse Gen Serial Number: 68053398

## 2020-06-12 ENCOUNTER — Telehealth: Payer: Self-pay | Admitting: Internal Medicine

## 2020-06-12 ENCOUNTER — Other Ambulatory Visit: Payer: Self-pay

## 2020-06-12 ENCOUNTER — Ambulatory Visit
Admission: RE | Admit: 2020-06-12 | Discharge: 2020-06-12 | Disposition: A | Discharge status: Home / Self Care | Payer: PPO | Source: Ambulatory Visit | Attending: Acute Care | Admitting: Acute Care

## 2020-06-12 DIAGNOSIS — Z87891 Personal history of nicotine dependence: Secondary | ICD-10-CM | POA: Diagnosis not present

## 2020-06-12 DIAGNOSIS — F17210 Nicotine dependence, cigarettes, uncomplicated: Secondary | ICD-10-CM | POA: Diagnosis not present

## 2020-06-12 NOTE — Telephone Encounter (Signed)
I spoke with the patient. I advised him that our Device Nurse team had been working to try to help facilitate the MRI getting done, but they were speaking [facility]. I have advised him that [facility] is a possiblity for the MRI, but Dr. Junius Roads will need to place the order for the test to be done there, and the only thing we do is fill out his Device Clinic once the test is schedule.  The patient voices understanding and agreeable to reaching out to Dr. Junius Roads. He was appreciative for the call back.

## 2020-06-12 NOTE — Telephone Encounter (Signed)
Patient calling in stating that Dr. Caryl Comes gave permission to have an MRI completed. Patient is reporting that the soonest MRI appointment would be 08/13/20 with [facility]. Patient is wanting to know if Dr. Caryl Comes could advise anywhere else to be seen sooner  Please advise

## 2020-06-13 ENCOUNTER — Telehealth: Payer: Self-pay

## 2020-06-13 ENCOUNTER — Ambulatory Visit: Admission: RE | Admit: 2020-06-13 | Payer: PPO | Source: Ambulatory Visit

## 2020-06-13 DIAGNOSIS — M545 Low back pain, unspecified: Secondary | ICD-10-CM

## 2020-06-13 DIAGNOSIS — Z950 Presence of cardiac pacemaker: Secondary | ICD-10-CM

## 2020-06-13 NOTE — Telephone Encounter (Signed)
Below are notes from cardiology.  Is it possible to send referral to [facility]?    I advised him that our Device Nurse team had been working to try to help facilitate the MRI getting done, but they were speaking [place]. I have advised him that [facility] is a possiblity for the MRI, but Dr. Junius Roads will need to place the order for the test to be done there, and the only thing we do is fill out his Device Clinic once the test is schedule.   The patient voices understanding and agreeable to reaching out to Dr. Junius Roads. He was appreciative for the call back.

## 2020-06-13 NOTE — Telephone Encounter (Signed)
Please advise 

## 2020-06-13 NOTE — Telephone Encounter (Signed)
Patient called he stated he wont be able to get a mri until July 20 he is requesting a call back with other options call back:[phone]

## 2020-06-13 NOTE — Telephone Encounter (Signed)
Orders are in

## 2020-06-13 NOTE — Addendum Note (Signed)
Addended by: Hortencia Pilar on: 06/13/2020 11:13 AM   Modules accepted: Orders

## 2020-06-13 NOTE — Progress Notes (Signed)
Please call patient and let them  know their  low dose Ct was read as a Lung RADS 1, negative study: no nodules or definitely benign nodules. Radiology recommendation is for a repeat LDCT in 12 months. .Please let them  know we will order and schedule their  annual screening scan for 05/2021. Please let them  know there was notation of CAD on their  scan.  Please remind the patient  that this is a non-gated exam therefore degree or severity of disease  cannot be determined. Please have them  follow up with their PCP regarding potential risk factor modification, dietary therapy or pharmacologic therapy if clinically indicated. Pt.  is currently on statin therapy. Please place order for annual  screening scan for  05/2021 and fax results to PCP. Thanks so much. 

## 2020-06-19 ENCOUNTER — Telehealth: Payer: Self-pay

## 2020-06-19 NOTE — Telephone Encounter (Signed)
Patient called stating he has not heard back from his lung cancer screening results done last week. I see results have been posted on 06/13/20 from Eric Form to call patient but no one has reached out still.   I am sending this to Judson Roch and [name] in case we can advise of these results. Thank you

## 2020-06-19 NOTE — Telephone Encounter (Signed)
Please review Sarah's comments with patient.

## 2020-06-20 ENCOUNTER — Other Ambulatory Visit: Payer: Self-pay | Admitting: *Deleted

## 2020-06-20 DIAGNOSIS — Z87891 Personal history of nicotine dependence: Secondary | ICD-10-CM

## 2020-06-20 DIAGNOSIS — F17210 Nicotine dependence, cigarettes, uncomplicated: Secondary | ICD-10-CM

## 2020-06-24 ENCOUNTER — Telehealth: Payer: Self-pay | Admitting: Internal Medicine

## 2020-06-24 ENCOUNTER — Telehealth: Payer: Self-pay | Admitting: Family Medicine

## 2020-06-24 NOTE — Telephone Encounter (Signed)
Patient calling to have Dr.  Caryl Comes review recent ct scan that showed calcification.   Please review and call to discuss.

## 2020-06-24 NOTE — Telephone Encounter (Signed)
The patient had a recent CT chest lung cancer screening on 06/13/20 that showed: IMPRESSION: 1. Lung-RADS 1, negative. Continue annual screening with low-dose chest CT without contrast in 12 months. 2. Aortic atherosclerosis (ICD10-I70.0). Coronary artery calcification. 3.  Emphysema (ICD10-J43.9).  Per Dr. Donivan Scull office note from 02/25/20: CAD, Coronary calcium noted on prior scans On lipitor and zetia,  Numbers at goal Recommended smoking cessation Unable to tolerate higher dose Lipitor  The patient is currently on lipitor 20 mg once daily & zetia 10 mg once daily.   Myoview done 08/26/2017: Narrative & Impression  Pharmacological myocardial perfusion imaging study with no significant  Ischemia Small region mild fixed defect apical wall on attenuation corrected images, unable to exclude processing air No apical defect noted on non-attenuation corrected images Non-attenuation corrected images with diaphragmatic attenuation/mild fixed decreased inferior wall perfusion. Defect resolved with attenuation correction Normal wall motion, EF estimated at 57% No EKG changes concerning for ischemia at peak stress or in recovery. Low risk scan   Signed, Esmond Plants, MD, Ph.D [facility]     To primary cardiologist- Dr. Rockey Situ to review. As the patient is on statin therapy I do not suspect any changes will be recommended, but will await further input from Dr. Rockey Situ.

## 2020-06-24 NOTE — Telephone Encounter (Signed)
Patient returned the call. Please call back. EM

## 2020-06-24 NOTE — Telephone Encounter (Signed)
Patient called asked if he can pick up a copy of CT scan and X-rays. Patient said he will pick up and take to Dr. Jene Every.  Patient said he can sign a medical records release form when he pick up the CT scan and X-Ray. The number to contact patient is [phone]

## 2020-06-24 NOTE — Telephone Encounter (Signed)
Left voicemail advising patient CD was ready for pickup at front desk.  

## 2020-06-24 NOTE — Telephone Encounter (Signed)
Left message to return call to our office.  

## 2020-06-24 NOTE — Telephone Encounter (Signed)
Called patient reviewed notes. He has requested a follow up with [name] to review changes and any further testing. I have made that appointment. He will also call cardiology and let them know that he had test done so they can review.

## 2020-06-26 ENCOUNTER — Telehealth: Payer: Self-pay | Admitting: Primary Care

## 2020-06-26 DIAGNOSIS — Z720 Tobacco use: Secondary | ICD-10-CM

## 2020-06-26 NOTE — Telephone Encounter (Signed)
Mr. Fackler called in wanted to know about getting a script for nicotine patches, and he tried to get them over the counter the pharmacy told him that he would need a script from [facility].  He tried them and they worked before, he used chantix and that didn't work.   Pharmacy: [address]

## 2020-06-27 MED ORDER — NICOTINE 21 MG/24HR TD PT24: 21.0000 mg | MEDICATED_PATCH | Freq: Every day | TRANSDERMAL | 0 refills | Status: DC

## 2020-06-27 NOTE — Telephone Encounter (Signed)
Pt returning call from CMA. 

## 2020-06-27 NOTE — Telephone Encounter (Signed)
Left message to return call to our office.  

## 2020-06-27 NOTE — Telephone Encounter (Signed)
Patient states as though this is a OTC medicine. The patients insurance will cover it if the provider writes a script for this. Please advise patient. EM

## 2020-06-27 NOTE — Telephone Encounter (Signed)
Yes, the insurance will cover some patients for nicotine patches.  Please instruct patient:  Apply 1 new patch to skin daily, rotate sites so that he doesn't experience skin breakdown. Have him update me in 3 weeks so we can refill and adjust the nicotine dose.   According to his chart he is a heavy smoker, will start with 21 mcg dose.

## 2020-06-30 NOTE — Telephone Encounter (Signed)
No further cardiac work-up needed Coronary calcification already noted Would stress the need for smoking cessation, goal LDL less than 70 to minimize any progression

## 2020-06-30 NOTE — Telephone Encounter (Signed)
Called patient has received over the weekend

## 2020-06-30 NOTE — Telephone Encounter (Signed)
Able to reach pt regarding his recent CT Calcium Score, Heather RN has spoken to him last week regarding results, pt had further questions and sent message to Dr. Rockey Situ for review,   Dr. Rockey Situ had a chance to review his results and advised   "No further cardiac work-up needed Coronary calcification already noted Would stress the need for smoking cessation, goal LDL less than 70 to minimize any progression  Brandon Lopez is thankful for call, will continue on lipitor 20 mg once daily & zetia 10 mg once daily. Reports has started smoking cessation, is currently on patches, has tried Chantix w/o success. Will also try exercise and dietary changes as well. Nothing further at this time, will call back with any further questions.

## 2020-07-03 ENCOUNTER — Other Ambulatory Visit: Payer: Self-pay

## 2020-07-03 ENCOUNTER — Encounter: Payer: Self-pay | Admitting: Primary Care

## 2020-07-03 ENCOUNTER — Ambulatory Visit (INDEPENDENT_AMBULATORY_CARE_PROVIDER_SITE_OTHER): Payer: PPO | Admitting: Primary Care

## 2020-07-03 VITALS — BP 120/72 | HR 79 | Temp 98.6°F | Ht 65.0 in | Wt 186.0 lb

## 2020-07-03 DIAGNOSIS — L989 Disorder of the skin and subcutaneous tissue, unspecified: Secondary | ICD-10-CM | POA: Insufficient documentation

## 2020-07-03 DIAGNOSIS — Z720 Tobacco use: Secondary | ICD-10-CM

## 2020-07-03 HISTORY — DX: Disorder of the skin and subcutaneous tissue, unspecified: L98.9

## 2020-07-03 NOTE — Patient Instructions (Signed)
Resume varenicline (Chantix) pills for smoking.  Start by taking 1/2 tablet once daily for 3 days, then increase to 1/2 tablet twice daily for 3 days, then increase to 1 full tablet twice daily.  Please update me as discussed.   You will be contacted regarding your referral to dermatology.  Please let us know if you have not been contacted within two weeks.  It was a pleasure to see you today!

## 2020-07-03 NOTE — Progress Notes (Signed)
Remote pacemaker transmission.   

## 2020-07-03 NOTE — Assessment & Plan Note (Signed)
Several noted. The lesion in question today may be precancerous. Will need dermatology evaluation, referral placed.

## 2020-07-03 NOTE — Progress Notes (Signed)
Subjective:    Patient ID: Brandon Lopez, male    DOB: [birth year], 65 y.o.   MRN: [id number]  HPI  Brandon Lopez is a very pleasant 65 y.o. male with a history of paroxysmal atrial fibrillation, OSA, COPD, tobacco abuse who presents today to discuss lung cancer screening results and to discuss skin lesions.  He underwent low dose CT chest on 06/12/20 which showed emphysema, aortic atherosclerosis, no evidence of lung nodules.   He's also discussed results with his cardiologist who acknowledged the cardiac calcification. He encouraged smoking cessation, LDL goal <70, continued statin and Zetia treatment.   He is working on cutting back on tobacco abuse, is smoking 2 packs of cigarettes daily, is contemplating treatment. He's quit on nicotine patches and Chantix previously. He's tried the nicotine patches for the last 4-5 days without much help. He has some Chantix at home from an older prescription.   He's noticed a new lesion to his right cheek about one month ago. He denies pain, has noticed an increase in size and texture. He has not seen a dermatologist.   BP Readings from Last 3 Encounters:  07/03/20 120/72  05/05/20 100/66  05/04/20 (!) 119/93     Review of Systems  Respiratory:  Negative for cough and shortness of breath.   Cardiovascular:  Negative for chest pain.  Neurological:  Negative for dizziness.        Past Medical History:  Diagnosis Date   Arthritis    Chronic systolic CHF (congestive heart failure) ([place])    a. echo 6/16: EF 55-60%, Gr1DD, LA nl, PASP nl; b. echo 7/19: EF 40-45%, unable to exclude RWMA, Gr1DD, LA nl, RVSF nl, PASP nl   COPD (chronic obstructive pulmonary disease) (HCC)    Depression    Diverticulosis    Found during colonoscopy   Hypertension    Kidney stones    PAF (paroxysmal atrial fibrillation) ([place])    a. short episodes previously noted on PPM interrogation; b. started on Xarelto 07/2017 given further episodes of Afib  noted during interrogation; c. 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 Biotronik; Dayna Barker DR-T [id number]; placed 08/2012   Sleep apnea    Symptomatic bradycardia    a. s/p Biotronik; Dayna Barker DR-T [id number]; placed 08/2012 at [place] History   Socioeconomic History   Marital status: Single    Spouse name: Not on file   Number of children: Not on file   Years of education: Not on file   Highest education level: Not on file  Occupational History   Not on file  Tobacco Use   Smoking status: Heavy Smoker    Packs/day: 2.00    Years: 50.00    Pack years: 100.00    Types: Cigarettes    Start date: 1964   Smokeless tobacco: Never   Tobacco comments:    started smoking at age 25, 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  Vaping Use   Vaping Use: Never used  Substance and Sexual Activity   Alcohol use: Yes    Alcohol/week: 8.0 - 14.0 standard drinks    Types: 8 - 14 Cans of beer per week   Drug use: Yes    Types: Marijuana   Sexual activity: Not on file  Other Topics Concern   Not on file  Social History Narrative   Not on  file   Social Determinants of Health   Financial Resource Strain: Low Risk    Difficulty of Paying Living Expenses: Not very hard  Food Insecurity: Not on file  Transportation Needs: Not on file  Physical Activity: Not on file  Stress: Not on file  Social Connections: Not on file  Intimate Partner Violence: Not on file    Past Surgical History:  Procedure Laterality Date   APPENDECTOMY  age 75   CARDIAC CATHETERIZATION     7 years ago   COLONOSCOPY N/A 05/27/2014   Procedure: COLONOSCOPY;  Surgeon: Hulen Luster, MD;  Location: [facility] ENDOSCOPY;  Service: Gastroenterology;  Laterality: N/A;   INSERT / REPLACE / REMOVE PACEMAKER     3 years ago    Family History  Problem Relation Age of Onset   Arthritis Mother    Heart disease Father     Hyperlipidemia Father    Hypertension Father    Cancer Paternal Grandfather     Allergies  Allergen Reactions   Simvastatin Diarrhea   Lisinopril Cough    Current Outpatient Medications on File Prior to Visit  Medication Sig Dispense Refill   albuterol (VENTOLIN HFA) 108 (90 Base) MCG/ACT inhaler INHALE 2 PUFFS BY MOUTH EVERY 6 HOURS AS NEEDED FOR WHEEZING AND FOR SHORTNESS OF BREATH 18 g 0   atorvastatin (LIPITOR) 20 MG tablet Take 1 tablet (20 mg total) by mouth every evening. For cholesterol 90 tablet 2   carvedilol (COREG) 3.125 MG tablet Take 1 tablet by mouth twice daily 180 tablet 2   ezetimibe (ZETIA) 10 MG tablet Take 1 tablet by mouth once daily 90 tablet 3   hydrochlorothiazide (HYDRODIURIL) 25 MG tablet Take by mouth.     loratadine (CLARITIN) 10 MG tablet Take 10 mg by mouth daily as needed for allergies.      meloxicam (MOBIC) 15 MG tablet Take 0.5-1 tablets (7.5-15 mg total) by mouth daily as needed for pain. 30 tablet 6   nicotine (NICODERM CQ - DOSED IN MG/24 HOURS) 21 mg/24hr patch Place 1 patch (21 mg total) onto the skin daily. Rotate patch site daily. 28 patch 0   SYMBICORT 160-4.5 MCG/ACT inhaler Inhale 2 puffs by mouth twice daily 33 g 1   telmisartan-hydrochlorothiazide (MICARDIS HCT) 40-12.5 MG tablet Take 1 tablet by mouth once daily for blood pressure 90 tablet 3   varenicline (CHANTIX) 1 MG tablet TAKE 1/2 (ONE-HALF) TABLET BY MOUTH ONCE DAILY FOR 3 DAYS THEN 1/2 (ONE-HALF) TAB TWICE DAILY FOR 4 DAYS THEN 1 TAB TWICE DAILY     No current facility-administered medications on file prior to visit.    BP 120/72   Pulse 79   Temp 98.6 F (37 C) (Temporal)   Ht 5\' 5"  (1.651 m)   Wt 186 lb (84.4 kg)   SpO2 97%   BMI 30.95 kg/m  Objective:   Physical Exam Cardiovascular:     Rate and Rhythm: Normal rate and regular rhythm.  Pulmonary:     Effort: Pulmonary effort is normal.     Breath sounds: Normal breath sounds. No wheezing or rales.  Musculoskeletal:      Cervical back: Neck supple.  Skin:    General: Skin is warm and dry.     Findings: Lesion present.     Comments: Several small, raised, rounded skin lesions to right cheek. Another to left forehead. Doesn't appear to be shingles.   Neurological:     Mental Status: He is alert and oriented  to person, place, and time.  Psychiatric:        Mood and Affect: Mood normal.          Assessment & Plan:      This visit occurred during the SARS-CoV-2 public health emergency.  Safety protocols were in place, including screening questions prior to the visit, additional usage of staff PPE, and extensive cleaning of exam room while observing appropriate contact time as indicated for disinfecting solutions.

## 2020-07-03 NOTE — Assessment & Plan Note (Signed)
Ready to quit.  Recent CT chest reviewed.  He has some varenicline 1 mg at home and will call back with his remaining pills. Will provide him with some instructions to start back.  Encouraged tobacco cessation.

## 2020-07-14 ENCOUNTER — Ambulatory Visit (INDEPENDENT_AMBULATORY_CARE_PROVIDER_SITE_OTHER): Payer: PPO

## 2020-07-14 ENCOUNTER — Telehealth: Payer: Self-pay

## 2020-07-14 DIAGNOSIS — Z0000 Encounter for general adult medical examination without abnormal findings: Secondary | ICD-10-CM | POA: Diagnosis not present

## 2020-07-14 NOTE — Chronic Care Management (AMB) (Addendum)
    Chronic Care Management Pharmacy Assistant   Name: Brandon Lopez  MRN: [id number] DOB: March 19, 1955  Reason for Encounter:  CCM chart review, appointment reminder   Recent office visits:  07/03/20 - [name] NP - Dermatology referral, Start Chantix.  Recent consult visits:  06/09/20 - Radiology confirmation of pacemaker  Hospital visits:  ED visit - 05/04/2020 due to acute Back Pain - oxycodone 5 mg every 6 hours as needed  Medications: Outpatient Encounter Medications as of 07/14/2020  Medication Sig   albuterol (VENTOLIN HFA) 108 (90 Base) MCG/ACT inhaler INHALE 2 PUFFS BY MOUTH EVERY 6 HOURS AS NEEDED FOR WHEEZING AND FOR SHORTNESS OF BREATH   atorvastatin (LIPITOR) 20 MG tablet Take 1 tablet (20 mg total) by mouth every evening. For cholesterol   carvedilol (COREG) 3.125 MG tablet Take 1 tablet by mouth twice daily   ezetimibe (ZETIA) 10 MG tablet Take 1 tablet by mouth once daily   hydrochlorothiazide (HYDRODIURIL) 25 MG tablet Take by mouth.   loratadine (CLARITIN) 10 MG tablet Take 10 mg by mouth daily as needed for allergies.    meloxicam (MOBIC) 15 MG tablet Take 0.5-1 tablets (7.5-15 mg total) by mouth daily as needed for pain.   SYMBICORT 160-4.5 MCG/ACT inhaler Inhale 2 puffs by mouth twice daily   telmisartan-hydrochlorothiazide (MICARDIS HCT) 40-12.5 MG tablet Take 1 tablet by mouth once daily for blood pressure   varenicline (CHANTIX) 1 MG tablet TAKE 1/2 (ONE-HALF) TABLET BY MOUTH ONCE DAILY FOR 3 DAYS THEN 1/2 (ONE-HALF) TAB TWICE DAILY FOR 4 DAYS THEN 1 TAB TWICE DAILY   No facility-administered encounter medications on file as of 07/14/2020.   Brandon Lopez did not answer the telephone to remind him of his upcoming telephone visit with [name] on 07/16/2020 at 9:00 AM.  Message was left to have all medications, supplements and any blood glucose and blood pressure readings available for review at appointment.  Star Rating  Drugs: Medication:   Last Fill: Day Supply Atorvastatin 20mg   06/20/20 41 Telmisartan 40-12.5mg  06/18/20 60  PCP appointment on 07/14/20 and Cardiology appointment on 09/09/20  [name], CPP notified  [name], [place] Assistant [phone]  I have reviewed the care management and care coordination activities outlined in this encounter and I am certifying that I agree with the content of this note. No further action required.  [name], PharmD Clinical Pharmacist [facility] [phone]

## 2020-07-14 NOTE — Progress Notes (Signed)
Subjective:   Brandon Lopez is a 65 y.o. male who presents for Medicare Annual/Subsequent preventive examination.  Review of Systems: N/A     I connected with the patient today by telephone and verified that I am speaking with the correct person using two identifiers. Location patient: home Location nurse: work Persons participating in the telephone visit: patient, nurse.   I discussed the limitations, risks, security and privacy concerns of performing an evaluation and management service by telephone and the availability of in person appointments. I also discussed with the patient that there may be a patient responsible charge related to this service. The patient expressed understanding and verbally consented to this telephonic visit.        Cardiac Risk Factors include: advanced age (>69men, >73 women);male gender;hypertension;Other (see comment), Risk factor comments: hyperlipidemia     Objective:    Today's Vitals   There is no height or weight on file to calculate BMI.  Advanced Directives 07/14/2020 12/11/2018 12/06/2017 08/10/2017 11/05/2015 05/27/2014  Does Patient Have a Medical Advance Directive? No No Yes No No No  Type of Advance Directive - Public librarian;Living will - - -  Copy of [place] in Chart? - - No - copy requested - - -  Would patient like information on creating a medical advance directive? No - Patient declined Yes (MAU/Ambulatory/Procedural Areas - Information given) - No - Patient declined Yes - Educational materials given No - patient declined information    Current Medications (verified) Outpatient Encounter Medications as of 07/14/2020  Medication Sig   albuterol (VENTOLIN HFA) 108 (90 Base) MCG/ACT inhaler INHALE 2 PUFFS BY MOUTH EVERY 6 HOURS AS NEEDED FOR WHEEZING AND FOR SHORTNESS OF BREATH   atorvastatin (LIPITOR) 20 MG tablet Take 1 tablet (20 mg total) by mouth every evening. For cholesterol    carvedilol (COREG) 3.125 MG tablet Take 1 tablet by mouth twice daily   ezetimibe (ZETIA) 10 MG tablet Take 1 tablet by mouth once daily   hydrochlorothiazide (HYDRODIURIL) 25 MG tablet Take by mouth.   loratadine (CLARITIN) 10 MG tablet Take 10 mg by mouth daily as needed for allergies.    meloxicam (MOBIC) 15 MG tablet Take 0.5-1 tablets (7.5-15 mg total) by mouth daily as needed for pain.   SYMBICORT 160-4.5 MCG/ACT inhaler Inhale 2 puffs by mouth twice daily   telmisartan-hydrochlorothiazide (MICARDIS HCT) 40-12.5 MG tablet Take 1 tablet by mouth once daily for blood pressure   varenicline (CHANTIX) 1 MG tablet TAKE 1/2 (ONE-HALF) TABLET BY MOUTH ONCE DAILY FOR 3 DAYS THEN 1/2 (ONE-HALF) TAB TWICE DAILY FOR 4 DAYS THEN 1 TAB TWICE DAILY   No facility-administered encounter medications on file as of 07/14/2020.    Allergies (verified) Simvastatin and Lisinopril   History: Past Medical History:  Diagnosis Date   Arthritis    Chronic systolic CHF (congestive heart failure) ([place])    a. echo 6/16: EF 55-60%, Gr1DD, LA nl, PASP nl; b. echo 7/19: EF 40-45%, unable to exclude RWMA, Gr1DD, LA nl, RVSF nl, PASP nl   COPD (chronic obstructive pulmonary disease) (HCC)    Depression    Diverticulosis    Found during colonoscopy   Hypertension    Kidney stones    PAF (paroxysmal atrial fibrillation) ([place])    a. short episodes previously noted on PPM interrogation; b. started on Xarelto 07/2017 given further episodes of Afib noted during interrogation; c. 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 Biotronik; Dayna Barker DR-T [id number]; placed 08/2012   Sleep apnea    Symptomatic bradycardia    a. s/p Biotronik; Dayna Barker DR-T [id number]; placed 08/2012 at [facility]   Past Surgical History:  Procedure Laterality Date   APPENDECTOMY  age 43   CARDIAC CATHETERIZATION     7 years ago    COLONOSCOPY N/A 05/27/2014   Procedure: COLONOSCOPY;  Surgeon: Hulen Luster, MD;  Location: [facility] ENDOSCOPY;  Service: Gastroenterology;  Laterality: N/A;   INSERT / REPLACE / REMOVE PACEMAKER     3 years ago   Family History  Problem Relation Age of Onset   Arthritis Mother    Heart disease Father    Hyperlipidemia Father    Hypertension Father    Cancer Paternal Grandfather    Social History   Socioeconomic History   Marital status: Single    Spouse name: Not on file   Number of children: Not on file   Years of education: Not on file   Highest education level: Not on file  Occupational History   Not on file  Tobacco Use   Smoking status: Heavy Smoker    Packs/day: 2.00    Years: 50.00    Pack years: 100.00    Types: Cigarettes    Start date: 1964   Smokeless tobacco: Never   Tobacco comments:    started smoking at age 33, 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  Vaping Use   Vaping Use: Never used  Substance and Sexual Activity   Alcohol use: Yes    Alcohol/week: 8.0 - 14.0 standard drinks    Types: 8 - 14 Cans of beer per week   Drug use: Yes    Types: Marijuana   Sexual activity: Not on file  Other Topics Concern   Not on file  Social History Narrative   Not on file   Social Determinants of Health   Financial Resource Strain: Low Risk    Difficulty of Paying Living Expenses: Not hard at all  Food Insecurity: No Food Insecurity   Worried About Charity fundraiser in the Last Year: Never true   Ran Out of Food in the Last Year: Never true  Transportation Needs: No Transportation Needs   Lack of Transportation (Medical): No   Lack of Transportation (Non-Medical): No  Physical Activity: Inactive   Days of Exercise per Week: 0 days   Minutes of Exercise per Session: 0 min  Stress: Stress Concern Present   Feeling of Stress : Very much  Social Connections: Not on file    Tobacco Counseling Ready to quit: Not Answered Counseling given: Not Answered Tobacco comments:  started smoking at age 53, 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   Clinical Intake:  Pre-visit preparation completed: Yes  Pain : No/denies pain     Nutritional Risks: None Diabetes: No  How often do you need to have someone help you when you read instructions, pamphlets, or other written materials from your doctor or pharmacy?: 1 - Never  Diabetic: No Nutrition Risk Assessment:  Has the patient had any N/V/D within the last 2 months?  No  Does the patient have any non-healing wounds?  No  Has the patient had any unintentional weight loss or weight gain?  No   Diabetes:  Is the patient diabetic?  No  If diabetic, was a CBG obtained today?   N/A Did the patient  bring in their glucometer from home?   N/A How often do you monitor your CBG's? N/A.   Financial Strains and Diabetes Management:  Are you having any financial strains with the device, your supplies or your medication?  N/A .  Does the patient want to be seen by Chronic Care Management for management of their diabetes?   N/A Would the patient like to be referred to a Nutritionist or for Diabetic Management?   N/A   Interpreter Needed?: No  Information entered by :: CJohnson, LPN   Activities of Daily Living In your present state of health, do you have any difficulty performing the following activities: 07/14/2020  Hearing? N  Vision? N  Difficulty concentrating or making decisions? N  Walking or climbing stairs? N  Dressing or bathing? N  Doing errands, shopping? N  Preparing Food and eating ? N  Using the Toilet? N  In the past six months, have you accidently leaked urine? N  Do you have problems with loss of bowel control? N  Managing your Medications? N  Managing your Finances? N  Housekeeping or managing your Housekeeping? N  Some recent data might be hidden    Patient Care Team: Pleas Koch, NP as PCP - General (Nurse Practitioner) [name], [facility] as Pharmacist (Pharmacist) Binnie Rail,  DC as Referring Physician (Chiropractic Medicine) Owens Loffler, MD as Consulting Physician (Family Medicine)  Indicate any recent Medical Services you may have received from other than [facility] providers in the past year (date may be approximate).     Assessment:   This is a routine wellness examination for Keshav.  Hearing/Vision screen Vision Screening - Comments:: Advised patient to get annual eye exams   Dietary issues and exercise activities discussed: Current Exercise Habits: The patient does not participate in regular exercise at present, Exercise limited by: None identified   Goals Addressed             This Visit's Progress    Patient Stated       07/14/2020, I will maintain and continue medications as prescribed.         Depression Screen PHQ 2/9 Scores 07/14/2020 01/11/2020 12/11/2018 12/06/2017 04/14/2016 11/05/2015 07/11/2014  PHQ - 2 Score 0 0 0 0 6 0 0  PHQ- 9 Score 0 - 0 0 17 - -    Fall Risk Fall Risk  07/14/2020 01/11/2020 12/11/2018 12/06/2017 11/05/2015  Falls in the past year? 1 0 0 0 No  Number falls in past yr: 1 - 0 - -  Injury with Fall? 0 - 0 - -  Risk for fall due to : Medication side effect - Medication side effect - -  Follow up Falls evaluation completed;Falls prevention discussed - Falls evaluation completed;Falls prevention discussed - -    FALL RISK PREVENTION PERTAINING TO THE HOME:  Any stairs in or around the home? Yes  If so, are there any without handrails? No  Home free of loose throw rugs in walkways, pet beds, electrical cords, etc? Yes  Adequate lighting in your home to reduce risk of falls? Yes   ASSISTIVE DEVICES UTILIZED TO PREVENT FALLS:  Life alert? No  Use of a cane, walker or w/c? No  Grab bars in the bathroom? No  Shower chair or bench in shower? No  Elevated toilet seat or a handicapped toilet? No   TIMED UP AND GO:  Was the test performed?  N/A telephone visit .    Cognitive Function: MMSE -  Mini Mental  State Exam 07/14/2020 12/11/2018 12/06/2017 11/05/2015  Not completed: Refused - - -  Orientation to time - 5 5 5   Orientation to Place - 5 5 5   Registration - 3 3 3   Attention/ Calculation - 5 0 0  Recall - 3 3 3   Language- name 2 objects - - 0 0  Language- repeat - 1 1 1   Language- follow 3 step command - - 3 3  Language- read & follow direction - - 0 0  Write a sentence - - 0 0  Copy design - - 0 0  Total score - - 20 20  Mini Cog  Mini-Cog screen was not completed. Patient refused. Maximum score is 22. A value of 0 denotes this part of the MMSE was not completed or the patient failed this part of the Mini-Cog screening.       Immunizations Immunization History  Administered Date(s) Administered   Influenza,inj,Quad PF,6+ Mos 11/15/2014, 11/05/2015, 12/23/2016, 12/06/2017, 12/12/2018, 01/11/2020   Pneumococcal Polysaccharide-23 11/04/2014   Td 10/26/2011   Zoster, Live 11/18/2015    TDAP status: Up to date  Flu Vaccine status: Up to date  Pneumococcal vaccine status: Up to date  Covid-19 vaccine status: Declined, Education has been provided regarding the importance of this vaccine but patient still declined. Advised may receive this vaccine at local pharmacy or Health Dept.or vaccine clinic. Aware to provide a copy of the vaccination record if obtained from local pharmacy or Health Dept. Verbalized acceptance and understanding.  Qualifies for Shingles Vaccine? Yes   Zostavax completed Yes   Shingrix Completed?: No.    Education has been provided regarding the importance of this vaccine. Patient has been advised to call insurance company to determine out of pocket expense if they have not yet received this vaccine. Advised may also receive vaccine at local pharmacy or Health Dept. Verbalized acceptance and understanding.  Screening Tests Health Maintenance  Topic Date Due   Pneumococcal Vaccine 63-65 Years old (1 - PCV) Never done   Zoster Vaccines- Shingrix (1 of 2)  Never done   HIV Screening  11/04/2025 (Originally 09/11/1970)   INFLUENZA VACCINE  08/25/2020   TETANUS/TDAP  10/25/2021   COLONOSCOPY (Pts 45-34yrs Insurance coverage will need to be confirmed)  05/26/2024   Hepatitis C Screening  Completed   HPV VACCINES  Aged Out   COVID-19 Vaccine  Discontinued    Health Maintenance  Health Maintenance Due  Topic Date Due   Pneumococcal Vaccine 56-55 Years old (1 - PCV) Never done   Zoster Vaccines- Shingrix (1 of 2) Never done    Colorectal cancer screening: Type of screening: Colonoscopy. Completed 05/27/2014. Repeat every 10 years  Lung Cancer Screening: (Low Dose CT Chest recommended if Age 69-80 years, 30 pack-year currently smoking OR have quit w/in 15years.) does qualify.   Lung Cancer Screening Referral: completed 06/12/2020  Additional Screening:  Hepatitis C Screening: does qualify; Completed 11/18/2015  Vision Screening: Recommended annual ophthalmology exams for early detection of glaucoma and other disorders of the eye. Is the patient up to date with their annual eye exam?  No  Who is the provider or what is the name of the office in which the patient attends annual eye exams? Insurance does not cover this service.  If pt is not established with a provider, would they like to be referred to a provider to establish care? No .   Dental Screening: Recommended annual dental exams for proper oral hygiene  Community  Resource Referral / Chronic Care Management: CRR required this visit?  No   CCM required this visit?  No      Plan:     I have personally reviewed and noted the following in the patient's chart:   Medical and social history Use of alcohol, tobacco or illicit drugs  Current medications and supplements including opioid prescriptions. Patient is not currently taking opioid prescriptions. Functional ability and status Nutritional status Physical activity Advanced directives List of other  physicians Hospitalizations, surgeries, and ER visits in previous 12 months Vitals Screenings to include cognitive, depression, and falls Referrals and appointments  In addition, I have reviewed and discussed with patient certain preventive protocols, quality metrics, and best practice recommendations. A written personalized care plan for preventive services as well as general preventive health recommendations were provided to patient.   Due to this being a telephonic visit, the after visit summary with patients personalized plan was offered to patient via office or my-chart. Patient preferred to pick up at office at next visit or via mychart.   Andrez Grime, LPN   [id number]

## 2020-07-14 NOTE — Patient Instructions (Signed)
Mr. Brandon Lopez , Thank you for taking time to come for your Medicare Wellness Visit. I appreciate your ongoing commitment to your health goals. Please review the following plan we discussed and let me know if I can assist you in the future.   Screening recommendations/referrals: Colonoscopy: Up to date, completed 05/27/2014, due 05/2024 Recommended yearly ophthalmology/optometry visit for glaucoma screening and checkup Recommended yearly dental visit for hygiene and checkup  Vaccinations: Influenza vaccine: Up to date, completed 01/11/2020, due 08/2020 Pneumococcal vaccine: Up to date, completed 11/04/2014, due at age 33  Tdap vaccine: Up to date, completed 10/26/2011, due 10/2021 Shingles vaccine: due, check with your insurance regarding coverage if interested    Covid-19: declined  Advanced directives: Advance directive discussed with you today. Even though you declined this today please call our office should you change your mind and we can give you the proper paperwork for you to fill out.   Conditions/risks identified: hypertension, hyperlipidemia   Next appointment: Follow up in one year for your annual wellness visit.   Preventive Care 40-64 Years, Male Preventive care refers to lifestyle choices and visits with your health care provider that can promote health and wellness. What does preventive care include? A yearly physical exam. This is also called an annual well check. Dental exams once or twice a year. Routine eye exams. Ask your health care provider how often you should have your eyes checked. Personal lifestyle choices, including: Daily care of your teeth and gums. Regular physical activity. Eating a healthy diet. Avoiding tobacco and drug use. Limiting alcohol use. Practicing safe sex. Taking low doses of aspirin every day. Taking vitamin and mineral supplements as recommended by your health care provider. What happens during an annual well check? The services and  screenings done by your health care provider during your annual well check will depend on your age, overall health, lifestyle risk factors, and family history of disease. Counseling  Your health care provider may ask you questions about your: Alcohol use. Tobacco use. Drug use. Emotional well-being. Home and relationship well-being. Sexual activity. Eating habits. History of falls. Memory and ability to understand (cognition). Work and work Statistician. Screening  You may have the following tests or measurements: Height, weight, and BMI. Blood pressure. Lipid and cholesterol levels. These may be checked every 5 years, or more frequently if you are over 58 years old. Skin check. Lung cancer screening. You may have this screening every year starting at age 80 if you have a 30-pack-year history of smoking and currently smoke or have quit within the past 15 years. Fecal occult blood test (FOBT) of the stool. You may have this test every year starting at age 67. Flexible sigmoidoscopy or colonoscopy. You may have a sigmoidoscopy every 5 years or a colonoscopy every 10 years starting at age 18. Prostate cancer screening. Recommendations will vary depending on your family history and other risks. Hepatitis C blood test. Hepatitis B blood test. Sexually transmitted disease (STD) testing. Diabetes screening. This is done by checking your blood sugar (glucose) after you have not eaten for a while (fasting). You may have this done every 1-3 years. Abdominal aortic aneurysm (AAA) screening. You may need this if you are a current or former smoker. Osteoporosis. You may be screened starting at age 67 if you are at high risk. Talk with your health care provider about your test results, treatment options, and if necessary, the need for more tests. Vaccines  Your health care provider may recommend certain vaccines,  such as: Influenza vaccine. This is recommended every year. Tetanus, diphtheria, and  acellular pertussis (Tdap, Td) vaccine. You may need a Td booster every 10 years. Zoster vaccine. You may need this after age 73. Pneumococcal 13-valent conjugate (PCV13) vaccine. One dose is recommended after age 29. Pneumococcal polysaccharide (PPSV23) vaccine. One dose is recommended after age 70. Talk to your health care provider about which screenings and vaccines you need and how often you need them. This information is not intended to replace advice given to you by your health care provider. Make sure you discuss any questions you have with your health care provider. Document Released: 02/07/2015 Document Revised: 10/01/2015 Document Reviewed: 11/12/2014 Elsevier Interactive Patient Education  2017 [place] Prevention in the Home Falls can cause injuries. They can happen to people of all ages. There are many things you can do to make your home safe and to help prevent falls. What can I do on the outside of my home? Regularly fix the edges of walkways and driveways and fix any cracks. Remove anything that might make you trip as you walk through a door, such as a raised step or threshold. Trim any bushes or trees on the path to your home. Use bright outdoor lighting. Clear any walking paths of anything that might make someone trip, such as rocks or tools. Regularly check to see if handrails are loose or broken. Make sure that both sides of any steps have handrails. Any raised decks and porches should have guardrails on the edges. Have any leaves, snow, or ice cleared regularly. Use sand or salt on walking paths during winter. Clean up any spills in your garage right away. This includes oil or grease spills. What can I do in the bathroom? Use night lights. Install grab bars by the toilet and in the tub and shower. Do not use towel bars as grab bars. Use non-skid mats or decals in the tub or shower. If you need to sit down in the shower, use a plastic, non-slip stool. Keep  the floor dry. Clean up any water that spills on the floor as soon as it happens. Remove soap buildup in the tub or shower regularly. Attach bath mats securely with double-sided non-slip rug tape. Do not have throw rugs and other things on the floor that can make you trip. What can I do in the bedroom? Use night lights. Make sure that you have a light by your bed that is easy to reach. Do not use any sheets or blankets that are too big for your bed. They should not hang down onto the floor. Have a firm chair that has side arms. You can use this for support while you get dressed. Do not have throw rugs and other things on the floor that can make you trip. What can I do in the kitchen? Clean up any spills right away. Avoid walking on wet floors. Keep items that you use a lot in easy-to-reach places. If you need to reach something above you, use a strong step stool that has a grab bar. Keep electrical cords out of the way. Do not use floor polish or wax that makes floors slippery. If you must use wax, use non-skid floor wax. Do not have throw rugs and other things on the floor that can make you trip. What can I do with my stairs? Do not leave any items on the stairs. Make sure that there are handrails on both sides of the stairs and  use them. Fix handrails that are broken or loose. Make sure that handrails are as long as the stairways. Check any carpeting to make sure that it is firmly attached to the stairs. Fix any carpet that is loose or worn. Avoid having throw rugs at the top or bottom of the stairs. If you do have throw rugs, attach them to the floor with carpet tape. Make sure that you have a light switch at the top of the stairs and the bottom of the stairs. If you do not have them, ask someone to add them for you. What else can I do to help prevent falls? Wear shoes that: Do not have high heels. Have rubber bottoms. Are comfortable and fit you well. Are closed at the toe. Do not  wear sandals. If you use a stepladder: Make sure that it is fully opened. Do not climb a closed stepladder. Make sure that both sides of the stepladder are locked into place. Ask someone to hold it for you, if possible. Clearly mark and make sure that you can see: Any grab bars or handrails. First and last steps. Where the edge of each step is. Use tools that help you move around (mobility aids) if they are needed. These include: Canes. Walkers. Scooters. Crutches. Turn on the lights when you go into a dark area. Replace any light bulbs as soon as they burn out. Set up your furniture so you have a clear path. Avoid moving your furniture around. If any of your floors are uneven, fix them. If there are any pets around you, be aware of where they are. Review your medicines with your doctor. Some medicines can make you feel dizzy. This can increase your chance of falling. Ask your doctor what other things that you can do to help prevent falls. This information is not intended to replace advice given to you by your health care provider. Make sure you discuss any questions you have with your health care provider. Document Released: 11/07/2008 Document Revised: 06/19/2015 Document Reviewed: 02/15/2014 Elsevier Interactive Patient Education  2017 [employer].

## 2020-07-14 NOTE — Progress Notes (Signed)
PCP notes:  Health Maintenance: Covid- declined Shingrix- due    Abnormal Screenings: none   Patient concerns: Urinary frequency at night time.    Nurse concerns: none   Next PCP appt.: none

## 2020-07-16 ENCOUNTER — Telehealth: Payer: PPO

## 2020-07-16 ENCOUNTER — Telehealth: Payer: Self-pay

## 2020-07-16 NOTE — Telephone Encounter (Signed)
  Chronic Care Management   Outreach Note  07/16/2020 Name: Brandon Lopez MRN: [id number] DOB: 1955/05/16  Referred by: Pleas Koch, NP Reason for call : CCM Follow Up Visit   An unsuccessful telephone outreach was attempted today. The patient was referred to the pharmacist for assistance with care management and care coordination.   [name], PharmD Clinical Pharmacist [facility] at [facility] [phone]

## 2020-07-21 ENCOUNTER — Telehealth: Payer: Self-pay

## 2020-07-21 NOTE — Chronic Care Management (AMB) (Addendum)
Chronic Care Management Pharmacy Assistant   Name: Brandon Lopez  MRN: [id number] DOB: 05-28-1955  Reason for Encounter: Smoking Cessation  Recent office visits:  None since last CCM contact  Recent consult visits:  None since last CCM contact  Hospital visits:  05/04/20- ED visit- Low back pain. No admission.  Medications: Outpatient Encounter Medications as of 07/21/2020  Medication Sig   albuterol (VENTOLIN HFA) 108 (90 Base) MCG/ACT inhaler INHALE 2 PUFFS BY MOUTH EVERY 6 HOURS AS NEEDED FOR WHEEZING AND FOR SHORTNESS OF BREATH   atorvastatin (LIPITOR) 20 MG tablet Take 1 tablet (20 mg total) by mouth every evening. For cholesterol   carvedilol (COREG) 3.125 MG tablet Take 1 tablet by mouth twice daily   ezetimibe (ZETIA) 10 MG tablet Take 1 tablet by mouth once daily   hydrochlorothiazide (HYDRODIURIL) 25 MG tablet Take by mouth.   loratadine (CLARITIN) 10 MG tablet Take 10 mg by mouth daily as needed for allergies.    meloxicam (MOBIC) 15 MG tablet Take 0.5-1 tablets (7.5-15 mg total) by mouth daily as needed for pain.   SYMBICORT 160-4.5 MCG/ACT inhaler Inhale 2 puffs by mouth twice daily   telmisartan-hydrochlorothiazide (MICARDIS HCT) 40-12.5 MG tablet Take 1 tablet by mouth once daily for blood pressure   varenicline (CHANTIX) 1 MG tablet TAKE 1/2 (ONE-HALF) TABLET BY MOUTH ONCE DAILY FOR 3 DAYS THEN 1/2 (ONE-HALF) TAB TWICE DAILY FOR 4 DAYS THEN 1 TAB TWICE DAILY   No facility-administered encounter medications on file as of 07/21/2020.   Called patient 07/21/20 to discuss tobacco cessation since starting chantix.   Current interest in quitting? Yes- states he has a gut feeling he needs to quit now for his health.   Current tobacco products:   Cigarettes- Yes  Cigars- No  E-Cigarettes- No  Current tobacco use:   2 packs per day  Duration of tobacco use: 57 years (states he started at 65 years old)  Prior quit attempts? Yes  If yes, what was helpful in  the past?  Wellbutrin, nicotine patches, chantix. States he was able to quit using each medication but not for very long due to stress.    How soon after you wake up do you have your first cigarette/cigar?  Within 30 minutes  Current medication therapy:   Chantix 1 mg - 1 tablet BID  What concerns do you have with any of the medications listed above?  No concerns but does not feel like it is helping.   Any side effects? No  Do you have a quit plan? Yes (states somewhat)  If yes What is going well with your current quit plan? Not going well.   What is going poorly with current quit plan? Not able to stop or decrease smoking.  How can we better support you in your quit attempts?  Not sure if anything will help at this time. Agreed to monthly check ins. States he is going to try to change his routine some and try to start working some to keep him from sitting at home bored.     Provided contact information for [name] [phone]) for any questions or concerns.   Star Rating Drugs:  Medication:   Last Fill: Day Supply Atorvastatin 20 mg  06/20/20 41 Telmisartan HCTZ 40-12.5 mg  06/18/20 60   [name], CPP notified  [name], [place] Pharmacy Assistant [phone]  I have reviewed the care management and care coordination activities outlined in this encounter and I  am certifying that I agree with the content of this note. Scheduling visit in 1 month to discuss quit plan.  [name], PharmD Clinical Pharmacist [facility] at [facility] [phone]

## 2020-07-29 ENCOUNTER — Ambulatory Visit: Payer: PPO | Admitting: Cardiovascular Disease

## 2020-07-29 ENCOUNTER — Telehealth: Payer: Self-pay | Admitting: Primary Care

## 2020-07-29 DIAGNOSIS — Z720 Tobacco use: Secondary | ICD-10-CM

## 2020-07-29 MED ORDER — VARENICLINE TARTRATE 1 MG PO TABS: 1.0000 mg | ORAL_TABLET | Freq: Two times a day (BID) | ORAL | 0 refills | Status: DC

## 2020-07-29 NOTE — Telephone Encounter (Signed)
Noted, new Rx sent to pharmacy. 

## 2020-07-29 NOTE — Telephone Encounter (Signed)
Spoke to patient and he has been taking them for about 2-3 weeks.  He only has 2-3 left  please advise.   Thanks. Dm/cma

## 2020-07-29 NOTE — Telephone Encounter (Signed)
Brandon Lopez called in due to he was told to call in with how many chantix he has left and he has 3 left. And not sure if he should keep taking them or stop

## 2020-07-29 NOTE — Telephone Encounter (Signed)
Has he started taking these yet?  If not then tell him that I'll send in a new prescription with instructions to restart the prescription.

## 2020-08-12 ENCOUNTER — Telehealth: Payer: Self-pay

## 2020-08-12 NOTE — Chronic Care Management (AMB) (Addendum)
    Chronic Care Management Pharmacy Assistant   Name: Brandon Lopez  MRN: [id number] DOB: 1955/08/08  Reason for Encounter: Reminder Call   Conditions to be addressed/monitored: HTN, HLD, and COPD   Medications: Outpatient Encounter Medications as of 08/12/2020  Medication Sig   albuterol (VENTOLIN HFA) 108 (90 Base) MCG/ACT inhaler INHALE 2 PUFFS BY MOUTH EVERY 6 HOURS AS NEEDED FOR WHEEZING AND FOR SHORTNESS OF BREATH   atorvastatin (LIPITOR) 20 MG tablet Take 1 tablet (20 mg total) by mouth every evening. For cholesterol   carvedilol (COREG) 3.125 MG tablet Take 1 tablet by mouth twice daily   ezetimibe (ZETIA) 10 MG tablet Take 1 tablet by mouth once daily   hydrochlorothiazide (HYDRODIURIL) 25 MG tablet Take by mouth.   loratadine (CLARITIN) 10 MG tablet Take 10 mg by mouth daily as needed for allergies.    meloxicam (MOBIC) 15 MG tablet Take 0.5-1 tablets (7.5-15 mg total) by mouth daily as needed for pain.   SYMBICORT 160-4.5 MCG/ACT inhaler Inhale 2 puffs by mouth twice daily   telmisartan-hydrochlorothiazide (MICARDIS HCT) 40-12.5 MG tablet Take 1 tablet by mouth once daily for blood pressure   varenicline (CHANTIX) 1 MG tablet Take 1 tablet (1 mg total) by mouth 2 (two) times daily. For smoking cessation   No facility-administered encounter medications on file as of 08/12/2020.   Brandon Lopez was contacted to remind him of his upcoming telephone visit with [name] on 08/19/2020 at 1:00pm. Patient was reminded to have all medications, supplements and any blood glucose and blood pressure readings available for review at appointment.  Are you having any problems with your medications? No  Do you have any concerns you like to discuss with the pharmacist? No  The patient reports his primary concern is his back pain and he goes for MRI at [facility] today    Star Rating Drugs: Medication:   Last Fill: Day Supply Atorvastatin 20mg   06/20/20 90  Telmisartan HCTZ  40-12.5 06/18/20 60  [name], CPP notified  [name], [place] Assistant [phone]  I have reviewed the care management and care coordination activities outlined in this encounter and I am certifying that I agree with the content of this note. No further action required.  [name], PharmD Clinical Pharmacist [facility] at [facility] [phone]

## 2020-08-13 DIAGNOSIS — R200 Anesthesia of skin: Secondary | ICD-10-CM | POA: Diagnosis not present

## 2020-08-13 DIAGNOSIS — R531 Weakness: Secondary | ICD-10-CM | POA: Diagnosis not present

## 2020-08-13 DIAGNOSIS — M545 Low back pain, unspecified: Secondary | ICD-10-CM | POA: Diagnosis not present

## 2020-08-13 DIAGNOSIS — M5127 Other intervertebral disc displacement, lumbosacral region: Secondary | ICD-10-CM | POA: Diagnosis not present

## 2020-08-13 DIAGNOSIS — M48061 Spinal stenosis, lumbar region without neurogenic claudication: Secondary | ICD-10-CM | POA: Diagnosis not present

## 2020-08-19 ENCOUNTER — Ambulatory Visit (INDEPENDENT_AMBULATORY_CARE_PROVIDER_SITE_OTHER): Payer: PPO | Admitting: Family Medicine

## 2020-08-19 ENCOUNTER — Other Ambulatory Visit: Payer: Self-pay

## 2020-08-19 ENCOUNTER — Encounter: Payer: Self-pay | Admitting: Family Medicine

## 2020-08-19 ENCOUNTER — Telehealth: Payer: PPO

## 2020-08-19 DIAGNOSIS — M545 Low back pain, unspecified: Secondary | ICD-10-CM | POA: Diagnosis not present

## 2020-08-19 NOTE — Progress Notes (Signed)
Office Visit Note   Patient: Brandon Lopez           Date of Birth: October 02, 1955           MRN: [id number] Visit Date: 08/19/2020 Requested by: Pleas Koch, NP [place],  [place] [address] PCP: Pleas Koch, NP  Subjective: Chief Complaint  Patient presents with   Lower Back - Pain, Follow-up    MRI review. Pain in the back has been tolerable - not taken any meloxicam in 8-9 days. Still has numbness in the right leg and now has pain in the left thigh.    HPI: He is here for follow-up low back pain with occasional right-sided sciatica.  His symptoms have become more manageable.  He had his MRI scan done recently and is here to discuss the results.               ROS:   All other systems were reviewed and are negative.  Objective: Vital Signs: There were no vitals taken for this visit.  Physical Exam:  General:  Alert and oriented, in no acute distress. Pulm:  Breathing unlabored. Psy:  Normal mood, congruent affect.  Low back: He points to the L5-S1 area as the region of his pain but he is not tender to palpation of his back today.  Negative straight leg raise.  Lower extremity strength remains normal.  Imaging: Recent MRI report was reviewed.  I do not have access to the images.  Primary finding is L5-S1 disc bulge with moderate by foraminal stenosis.    Assessment & Plan: Improved low back pain with right-sided sciatica, and underlying L5-S1 disc bulge with by foraminal stenosis. -Discussed options, he wants to try physical therapy at [facility].  If symptoms worsen, could consider epidural steroid injection.  Follow-up as needed.     Procedures: No procedures performed        PMFS History: Patient Active Problem List   Diagnosis Date Noted   Skin lesions 07/03/2020   Chronic back pain 08/17/2019   Chronic shoulder pain 12/12/2018   Rectal bleeding 09/19/2018   Paroxysmal atrial fibrillation ([place]) 10/31/2017   Syncope 08/10/2017    Osteoarthritis 03/17/2017   Edema 07/21/2016   Cigarette smoker 11/17/2015   Diverticulosis 12/16/2014   Chronic obstructive pulmonary disease ([place]) 11/26/2014   Cervical pain (neck) 10/14/2014   Hyperlipidemia 07/11/2014   Preventative health care 07/11/2014   Pacemaker 06/03/2014   Anxiety and depression 06/03/2014   Alcohol abuse 06/03/2014   OSA (obstructive sleep apnea) 06/03/2014   Essential hypertension 06/03/2014   Tobacco abuse 06/03/2014   Past Medical History:  Diagnosis Date   Arthritis    Chronic systolic CHF (congestive heart failure) ([place])    a. echo 6/16: EF 55-60%, Gr1DD, LA nl, PASP nl; b. echo 7/19: EF 40-45%, unable to exclude RWMA, Gr1DD, LA nl, RVSF nl, PASP nl   COPD (chronic obstructive pulmonary disease) ([place])    Depression    Diverticulosis    Found during colonoscopy   Hypertension    Kidney stones    PAF (paroxysmal atrial fibrillation) ([place])    a. short episodes previously noted on PPM interrogation; b. started on Xarelto 07/2017 given further episodes of Afib noted during interrogation; c. 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 Biotronik; Dayna Barker DR-T [id number]; placed  08/2012   Sleep apnea    Symptomatic bradycardia    a. s/p Biotronik; Dayna Barker DR-T [id number]; placed 08/2012 at [facility] History  Problem Relation Age of Onset   Arthritis Mother    Heart disease Father    Hyperlipidemia Father    Hypertension Father    Cancer Paternal Grandfather     Past Surgical History:  Procedure Laterality Date   APPENDECTOMY  age 50   CARDIAC CATHETERIZATION     7 years ago   COLONOSCOPY N/A 05/27/2014   Procedure: COLONOSCOPY;  Surgeon: Hulen Luster, MD;  Location: [facility] ENDOSCOPY;  Service: Gastroenterology;  Laterality: N/A;   INSERT / REPLACE / REMOVE PACEMAKER     3 years ago   Social History   Occupational History   Not on  file  Tobacco Use   Smoking status: Heavy Smoker    Packs/day: 2.00    Years: 50.00    Pack years: 100.00    Types: Cigarettes    Start date: 1964   Smokeless tobacco: Never   Tobacco comments:    started smoking at age 55, 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  Vaping Use   Vaping Use: Never used  Substance and Sexual Activity   Alcohol use: Yes    Alcohol/week: 8.0 - 14.0 standard drinks    Types: 8 - 14 Cans of beer per week   Drug use: Yes    Types: Marijuana   Sexual activity: Not on file

## 2020-08-29 DIAGNOSIS — M5441 Lumbago with sciatica, right side: Secondary | ICD-10-CM | POA: Diagnosis not present

## 2020-08-29 DIAGNOSIS — M4807 Spinal stenosis, lumbosacral region: Secondary | ICD-10-CM | POA: Diagnosis not present

## 2020-08-29 DIAGNOSIS — S34125D Incomplete lesion of L5 level of lumbar spinal cord, subsequent encounter: Secondary | ICD-10-CM | POA: Diagnosis not present

## 2020-08-29 DIAGNOSIS — M545 Low back pain, unspecified: Secondary | ICD-10-CM | POA: Diagnosis not present

## 2020-08-31 ENCOUNTER — Other Ambulatory Visit: Payer: Self-pay | Admitting: Primary Care

## 2020-08-31 DIAGNOSIS — J449 Chronic obstructive pulmonary disease, unspecified: Secondary | ICD-10-CM

## 2020-09-02 ENCOUNTER — Telehealth: Payer: Self-pay | Admitting: Primary Care

## 2020-09-02 NOTE — Telephone Encounter (Signed)
Called patient let know we do not have any samples in office.

## 2020-09-02 NOTE — Telephone Encounter (Signed)
Brandon Lopez called in and stated that the [facility] 160-4.5 MCG/ACT inhaler is too expense, and called the manufacturer and they told him that it would take 2 weeks to get in the system to get it for cheaper. And the manufacturer told him to ask if its possible to get a sample until he gets in the system.

## 2020-09-09 ENCOUNTER — Ambulatory Visit (INDEPENDENT_AMBULATORY_CARE_PROVIDER_SITE_OTHER): Payer: PPO

## 2020-09-09 DIAGNOSIS — I495 Sick sinus syndrome: Secondary | ICD-10-CM

## 2020-09-09 LAB — CUP PACEART REMOTE DEVICE CHECK
Date Time Interrogation Session: 20220816073943
Implantable Lead Implant Date: 20140801
Implantable Lead Implant Date: 20140801
Implantable Lead Location: 753859
Implantable Lead Location: 753860
Implantable Lead Model: 362
Implantable Lead Serial Number: 29356730
Implantable Lead Serial Number: 29392661
Implantable Pulse Generator Implant Date: 20140801
Pulse Gen Serial Number: 68053398

## 2020-09-19 ENCOUNTER — Telehealth: Payer: Self-pay | Admitting: *Deleted

## 2020-09-19 DIAGNOSIS — J449 Chronic obstructive pulmonary disease, unspecified: Secondary | ICD-10-CM

## 2020-09-19 MED ORDER — SYMBICORT 160-4.5 MCG/ACT IN AERO: 2.0000 | INHALATION_SPRAY | Freq: Two times a day (BID) | RESPIRATORY_TRACT | 0 refills | Status: DC

## 2020-09-19 NOTE — Telephone Encounter (Signed)
Please advise 

## 2020-09-19 NOTE — Telephone Encounter (Signed)
I put this in your inbox last weekend to check on. Do you still have this?

## 2020-09-19 NOTE — Telephone Encounter (Signed)
Brandon Lopez called in wanted to know if a script could be faxed to them. The fax number is [phone]

## 2020-09-19 NOTE — Telephone Encounter (Signed)
Noted, placed Rx in Joellen's inbox.

## 2020-09-19 NOTE — Telephone Encounter (Signed)
They have received that form they need script faxed to them

## 2020-09-19 NOTE — Telephone Encounter (Signed)
Page with [facility] left VM at triage. She is requesting that PCP fax over a Rx for pt's symbicort. She said they did fax a Rx request on 09/13/20. the Rx they received back was a form PCP signed to get pt some samples. They said they now need a real Rx faxed to them for the symbicort at fax # [phone]  [facility] # [phone]

## 2020-09-22 NOTE — Telephone Encounter (Signed)
Order faxed.

## 2020-09-24 NOTE — Telephone Encounter (Signed)
A Rep from [facility] call regarding medication . Would like a call back  [phone]

## 2020-09-24 NOTE — Telephone Encounter (Signed)
Called number was on hold for over 15 minutes with patient could not hold any longer.

## 2020-09-28 NOTE — Progress Notes (Signed)
Remote pacemaker transmission.   

## 2020-10-02 ENCOUNTER — Telehealth: Payer: Self-pay | Admitting: Primary Care

## 2020-10-02 ENCOUNTER — Other Ambulatory Visit: Payer: Self-pay

## 2020-10-02 DIAGNOSIS — J449 Chronic obstructive pulmonary disease, unspecified: Secondary | ICD-10-CM

## 2020-10-02 MED ORDER — SYMBICORT 160-4.5 MCG/ACT IN AERO: 2.0000 | INHALATION_SPRAY | Freq: Two times a day (BID) | RESPIRATORY_TRACT | 0 refills | Status: DC

## 2020-10-02 NOTE — Telephone Encounter (Signed)
Faxed to both numbers in message. No further action at this time.

## 2020-10-02 NOTE — Telephone Encounter (Signed)
Mr. Brandon Lopez called and stated that [facility] needs a prescription for symibcort. The fax number is [phone] or [phone])

## 2020-10-02 NOTE — Telephone Encounter (Signed)
Tried to print to resend but will not print for me. Can you reprint so I can send?

## 2020-10-02 NOTE — Telephone Encounter (Signed)
Printed Rx and placed on [employer].

## 2020-10-02 NOTE — Telephone Encounter (Signed)
Received another call requesting re fax the script. See open message for documentation.

## 2020-10-29 ENCOUNTER — Telehealth: Payer: Self-pay

## 2020-10-29 NOTE — Progress Notes (Addendum)
    Chronic Care Management Pharmacy Assistant   Name: Brandon Lopez  MRN: [id number] DOB: 28-Apr-1955  Reason for Encounter: General Adherence   Recent office visits:  None since last CCM visit  Recent consult visits:  08/19/2020 - Eunice Blase, MD - Patient presented for low back pain. Patient will try physical therapy at [facility].  08/13/2020 - Oliva Bustard, DO - Patient presented for low back pain without sciatica. MRI performed.   Hospital visits:  None since last CCM visit  Medications: Outpatient Encounter Medications as of 10/29/2020  Medication Sig   albuterol (VENTOLIN HFA) 108 (90 Base) MCG/ACT inhaler INHALE 2 PUFFS BY MOUTH EVERY 6 HOURS AS NEEDED FOR WHEEZING AND FOR SHORTNESS OF BREATH   atorvastatin (LIPITOR) 20 MG tablet Take 1 tablet (20 mg total) by mouth every evening. For cholesterol   carvedilol (COREG) 3.125 MG tablet Take 1 tablet by mouth twice daily   ezetimibe (ZETIA) 10 MG tablet Take 1 tablet by mouth once daily   hydrochlorothiazide (HYDRODIURIL) 25 MG tablet Take by mouth.   loratadine (CLARITIN) 10 MG tablet Take 10 mg by mouth daily as needed for allergies.    meloxicam (MOBIC) 15 MG tablet Take 0.5-1 tablets (7.5-15 mg total) by mouth daily as needed for pain.   SYMBICORT 160-4.5 MCG/ACT inhaler Inhale 2 puffs into the lungs 2 (two) times daily.   telmisartan-hydrochlorothiazide (MICARDIS HCT) 40-12.5 MG tablet Take 1 tablet by mouth once daily for blood pressure   varenicline (CHANTIX) 1 MG tablet Take 1 tablet (1 mg total) by mouth 2 (two) times daily. For smoking cessation   No facility-administered encounter medications on file as of 10/29/2020.   Contacted Fawaz Borquez Ramstad on 11/05/2020 for general disease state and medication adherence call.   Patient is not > 5 days past due for refill on the following medications per chart history:  [facility] Medications: Medication Name/mg    Last Fill Days Supply Telmisartan-Hydrochlorothiazide  40-12.5mg   10/14/2020 60 Atorvastatin 20mg       10/23/2020 41  What concerns do you have about your medications? Patient states he doesn't have any concerns.   The patient denies side effects with his medications.   How often do you forget or accidentally miss a dose? Rarely  Do you use a pillbox? No  Are you having any problems getting your medications from your pharmacy? No  Has the cost of your medications been a concern? No  Since last visit with CPP, no interventions have been made.  The patient has not had an ED visit since last contact.   The patient denies problems with their health. Patient just states his back is giving him problems, but he is working on that.   he denies  concerns or questions for [name], [place]. D at this time.   Counseled patient on:  Great job taking medications  Care Gaps: Annual wellness visit in last year? Yes 07/14/2020 Most Recent BP reading: 120/72 on 07/03/2020  10/30/2020 appointment at [facility] [place] notified  Marijean Niemann, [place] Assistant [phone]  I have reviewed the care management and care coordination activities outlined in this encounter and I am certifying that I agree with the content of this note. No further action required.  [name], PharmD Clinical Pharmacist [facility] at [facility] [phone]

## 2020-10-30 ENCOUNTER — Encounter: Payer: Self-pay | Admitting: Surgery

## 2020-10-30 ENCOUNTER — Other Ambulatory Visit: Payer: Self-pay

## 2020-10-30 ENCOUNTER — Telehealth: Payer: Self-pay | Admitting: Surgery

## 2020-10-30 ENCOUNTER — Ambulatory Visit: Payer: PPO | Admitting: Surgery

## 2020-10-30 VITALS — BP 120/80 | HR 80

## 2020-10-30 DIAGNOSIS — M5416 Radiculopathy, lumbar region: Secondary | ICD-10-CM | POA: Diagnosis not present

## 2020-10-30 DIAGNOSIS — M5126 Other intervertebral disc displacement, lumbar region: Secondary | ICD-10-CM

## 2020-10-30 NOTE — Progress Notes (Signed)
Office Visit Note   Patient: Brandon Lopez           Date of Birth: 24-Sep-1955           MRN: [id number] Visit Date: 10/30/2020              Requested by: Pleas Koch, NP [place],  [place] [address] PCP: Pleas Koch, NP   Assessment & Plan: Visit Diagnoses:  1. HNP (herniated nucleus pulposus), lumbar   2. Radiculopathy, lumbar region     Plan: Since patient is now considering having the lumbar ESI's that were previously discussed and arranged by Dr. Junius Roads I will schedule a consult with Dr. [name] and he can discuss this further.  Patient states that he is not interested in having a surgery.  I will have him follow-up in 6 weeks with Dr. Lorin Mercy for recheck.  No pain medications given today.  Patient advised that he can use Tylenol or oral NSAID as he has been using.  Follow-Up Instructions: Return in about 6 weeks (around 12/11/2020) for WITH DR [place].  .   Orders:  Orders Placed This Encounter  Procedures   Ambulatory referral to Physical Medicine Rehab   No orders of the defined types were placed in this encounter.     Procedures: No procedures performed   Clinical Data: No additional findings.   Subjective: Chief Complaint  Patient presents with   Lower Back - Pain    HPI 65 year old white male is being seen for ongoing low back pain and right lower extremity radiculopathy.  Patient was previously seen Dr. Eunice Blase for this who is no longer in our practice.  Last office visit August 19, 2020.  Patient states that Dr. Junius Roads had scheduled him for a lumbar ESI but then patient no showed the appointment or canceled because he decided that he did not want it.  He comes in today to discuss the benefits of lumbar ESI.  I reviewed the MRI report that is in care everywhere.  I do not have access to the actual films.  Continues have ongoing low back pain radiating to his right buttock and down  towards his foot.  Numbness and tingling in this area as well.  In the past he has had some episodes of right lower extremity weakness.  He states that he ran out of Ultram that was previously given by Dr. Junius Roads Review of Systems No current cardiac pulmonary GI GU issues  Objective: Vital Signs: BP 120/80   Pulse 80   Physical Exam HENT:     Head: Normocephalic.  Pulmonary:     Effort: No respiratory distress.  Musculoskeletal:     Comments: Gait is somewhat antalgic.  Negative logroll bilateral hips.  Positive right straight leg raise.  No focal motor deficits.  Neurological:     Mental Status: He is alert.    Ortho Exam  Specialty Comments:  No specialty comments available.  Imaging: No results found.   PMFS History: Patient Active Problem List   Diagnosis Date Noted   Skin lesions 07/03/2020   Chronic back pain 08/17/2019   Chronic shoulder pain 12/12/2018   Rectal bleeding 09/19/2018   Paroxysmal atrial fibrillation ([place]) 10/31/2017   Syncope 08/10/2017   Osteoarthritis 03/17/2017   Edema 07/21/2016   Cigarette smoker 11/17/2015   Diverticulosis 12/16/2014   Chronic obstructive pulmonary disease ([place]) 11/26/2014   Cervical pain (neck) 10/14/2014  Hyperlipidemia 07/11/2014   Preventative health care 07/11/2014   Pacemaker 06/03/2014   Anxiety and depression 06/03/2014   Alcohol abuse 06/03/2014   OSA (obstructive sleep apnea) 06/03/2014   Essential hypertension 06/03/2014   Tobacco abuse 06/03/2014   Past Medical History:  Diagnosis Date   Arthritis    Chronic systolic CHF (congestive heart failure) ([place])    a. echo 6/16: EF 55-60%, Gr1DD, LA nl, PASP nl; b. echo 7/19: EF 40-45%, unable to exclude RWMA, Gr1DD, LA nl, RVSF nl, PASP nl   COPD (chronic obstructive pulmonary disease) (HCC)    Depression    Diverticulosis    Found during colonoscopy   Hypertension    Kidney stones    PAF (paroxysmal atrial fibrillation) ([place])    a. short episodes  previously noted on PPM interrogation; b. started on Xarelto 07/2017 given further episodes of Afib noted during interrogation; c. 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 Biotronik; Dayna Barker DR-T [id number]; placed 08/2012   Sleep apnea    Symptomatic bradycardia    a. s/p Biotronik; Dayna Barker DR-T [id number]; placed 08/2012 at [facility] History  Problem Relation Age of Onset   Arthritis Mother    Heart disease Father    Hyperlipidemia Father    Hypertension Father    Cancer Paternal Grandfather     Past Surgical History:  Procedure Laterality Date   APPENDECTOMY  age 65   CARDIAC CATHETERIZATION     7 years ago   COLONOSCOPY N/A 05/27/2014   Procedure: COLONOSCOPY;  Surgeon: Hulen Luster, MD;  Location: [facility] ENDOSCOPY;  Service: Gastroenterology;  Laterality: N/A;   INSERT / REPLACE / REMOVE PACEMAKER     3 years ago   Social History   Occupational History   Not on file  Tobacco Use   Smoking status: Heavy Smoker    Packs/day: 2.00    Years: 50.00    Pack years: 100.00    Types: Cigarettes    Start date: 1964   Smokeless tobacco: Never   Tobacco comments:    started smoking at age 25, 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  Vaping Use   Vaping Use: Never used  Substance and Sexual Activity   Alcohol use: Yes    Alcohol/week: 8.0 - 14.0 standard drinks    Types: 8 - 14 Cans of beer per week   Drug use: Yes    Types: Marijuana   Sexual activity: Not on file

## 2020-10-30 NOTE — Telephone Encounter (Signed)
Pt called stating his needs a copy of his MRI sent to [facility]. Explained to pt he will have to come in and sign medical release form and we will get it taken care of

## 2020-11-11 ENCOUNTER — Other Ambulatory Visit: Payer: Self-pay

## 2020-11-11 ENCOUNTER — Emergency Department: Payer: PPO

## 2020-11-11 ENCOUNTER — Emergency Department
Admission: EM | Admit: 2020-11-11 | Discharge: 2020-11-11 | Disposition: A | Discharge status: Home / Self Care | Payer: PPO | Attending: Emergency Medicine | Admitting: Emergency Medicine

## 2020-11-11 ENCOUNTER — Telehealth: Payer: Self-pay | Admitting: *Deleted

## 2020-11-11 DIAGNOSIS — R079 Chest pain, unspecified: Secondary | ICD-10-CM | POA: Diagnosis not present

## 2020-11-11 DIAGNOSIS — Z79899 Other long term (current) drug therapy: Secondary | ICD-10-CM | POA: Diagnosis not present

## 2020-11-11 DIAGNOSIS — Z950 Presence of cardiac pacemaker: Secondary | ICD-10-CM | POA: Diagnosis not present

## 2020-11-11 DIAGNOSIS — F17210 Nicotine dependence, cigarettes, uncomplicated: Secondary | ICD-10-CM | POA: Insufficient documentation

## 2020-11-11 DIAGNOSIS — I5022 Chronic systolic (congestive) heart failure: Secondary | ICD-10-CM | POA: Insufficient documentation

## 2020-11-11 DIAGNOSIS — J449 Chronic obstructive pulmonary disease, unspecified: Secondary | ICD-10-CM | POA: Insufficient documentation

## 2020-11-11 DIAGNOSIS — R0789 Other chest pain: Secondary | ICD-10-CM | POA: Diagnosis not present

## 2020-11-11 DIAGNOSIS — Z7951 Long term (current) use of inhaled steroids: Secondary | ICD-10-CM | POA: Insufficient documentation

## 2020-11-11 DIAGNOSIS — I110 Hypertensive heart disease with heart failure: Secondary | ICD-10-CM | POA: Insufficient documentation

## 2020-11-11 LAB — CBC
HCT: 45 % (ref 39.0–52.0)
Hemoglobin: 16.1 g/dL (ref 13.0–17.0)
MCH: 33.1 pg (ref 26.0–34.0)
MCHC: 35.8 g/dL (ref 30.0–36.0)
MCV: 92.4 fL (ref 80.0–100.0)
Platelets: 266 10*3/uL (ref 150–400)
RBC: 4.87 MIL/uL (ref 4.22–5.81)
RDW: 12.4 % (ref 11.5–15.5)
WBC: 9.7 10*3/uL (ref 4.0–10.5)
nRBC: 0 % (ref 0.0–0.2)

## 2020-11-11 LAB — BASIC METABOLIC PANEL
Anion gap: 10 (ref 5–15)
BUN: 10 mg/dL (ref 8–23)
CO2: 24 mmol/L (ref 22–32)
Calcium: 9.3 mg/dL (ref 8.9–10.3)
Chloride: 101 mmol/L (ref 98–111)
Creatinine, Ser: 0.65 mg/dL (ref 0.61–1.24)
GFR, Estimated: >60 mL/min (ref >60)
Glucose, Bld: 97 mg/dL (ref 70–99)
Potassium: 3.8 mmol/L (ref 3.5–5.1)
Sodium: 135 mmol/L (ref 135–145)

## 2020-11-11 LAB — TROPONIN I (HIGH SENSITIVITY)
Troponin I (High Sensitivity): <2 ng/L (ref <18)
Troponin I (High Sensitivity): 3 ng/L (ref <18)

## 2020-11-11 NOTE — Discharge Instructions (Addendum)
As we discussed please be sure to follow up shortly with your cardiologists. Please seek medical attention for any high fevers, chest pain, shortness of breath, change in behavior, persistent vomiting, bloody stool or any other new or concerning symptoms.

## 2020-11-11 NOTE — Telephone Encounter (Signed)
Noted, it looks like he has arrived at [facility] ED and has been triaged.  Will await notes.

## 2020-11-11 NOTE — Telephone Encounter (Signed)
PLEASE NOTE: All timestamps contained within this report are represented as [place] Standard Time. CONFIDENTIALTY NOTICE: This fax transmission is intended only for the addressee. It contains information that is legally privileged, confidential or otherwise protected from use or disclosure. If you are not the intended recipient, you are strictly prohibited from reviewing, disclosing, copying using or disseminating any of this information or taking any action in reliance on or regarding this information. If you have received this fax in error, please notify us immediately by telephone so that we can arrange for its return to [place]. Phone: [phone], Toll-Free: [phone], Fax: [phone] Page: 1 of 2 Call Id: [id number] [place] [facility] - Client TELEPHONE ADVICE RECORD AccessNurse Patient Name: Brandon Lopez Gender: Male DOB: 1955/05/01 Age: 65 Y 2 M 1 D Return Phone Number: [phone] (Primary) Address[place]  [address] Client [facility] - Client Client Site [place] - Day Physician [name] - NP Contact Type Call Who Is Calling Patient / Member / Family / Caregiver Call Type Triage / Clinical Relationship To Patient Self Return Phone Number [phone] (Primary) Chief Complaint CHEST PAIN - pain, pressure, heaviness or tightness Reason for Call Symptomatic / Request for [place] states he is having chest pains. Translation No Nurse Assessment Nurse: Markus Daft, RN, Sherre Poot Date/Time (Eastern Time): 11/11/2020 11:09:13 AM Confirm and document reason for call. If symptomatic, describe symptoms. ---Caller states he is having chest pains off/on. This am, it felt worse than it normally does, and lasted more than 5 min. 5-6/10. No CP right now. Started 2 wks ago. Does the patient have any new or worsening symptoms? ---Yes Will a  triage be completed? ---Yes Related visit to physician within the last 2 weeks? ---No Does the PT have any chronic conditions? (i.e. diabetes, asthma, this includes High risk factors for pregnancy, etc.) ---Yes List chronic conditions. ---Pacemaker, emphysema Is this a behavioral health or substance abuse call? ---No Guidelines Guideline Title Affirmed Question Affirmed Notes Nurse Date/Time (Eastern Time) Chest Pain [1] Chest pain (or "angina") comes and goes AND [2] is happening more often (increasing in frequency) or getting worse (increasing in severity) (Exception: chest pains that last only a few seconds) Markus Daft, RN, Sherre Poot 11/11/2020 11:12:22 AM PLEASE NOTE: All timestamps contained within this report are represented as [place] Standard Time. CONFIDENTIALTY NOTICE: This fax transmission is intended only for the addressee. It contains information that is legally privileged, confidential or otherwise protected from use or disclosure. If you are not the intended recipient, you are strictly prohibited from reviewing, disclosing, copying using or disseminating any of this information or taking any action in reliance on or regarding this information. If you have received this fax in error, please notify us immediately by telephone so that we can arrange for its return to [place]. Phone: [phone], Toll-Free: [phone], Fax: [phone] Page: 2 of 2 Call Id: [id number] [place]. Time Eilene Ghazi Time) Disposition Final User 11/11/2020 11:07:57 AM Send to Urgent Gwenlyn Found, [place] 11/11/2020 11:13:43 AM Go to ED Now Yes Markus Daft, RN, [place] King[place]isher Disagree/Comply Comply Caller Understands Yes PreDisposition Go to Urgent Care/Walk-In Clinic Care Advice Given Per Guideline GO TO ED NOW: * You need to be seen in the Emergency Department. ANOTHER ADULT SHOULD DRIVE: * It is better and safer if another adult drives instead of you. NOTHING BY MOUTH: * Do not eat or drink anything for now. BRING  MEDICINES: * Bring  a list of your current medicines when you go to the Emergency Department (ER). CALL EMS IF: * Severe difficulty breathing occurs * Passes out or becomes too weak to stand * You become worse CARE ADVICE given per Chest Pain (Adult) guideline. Referrals [facility] - ED

## 2020-11-11 NOTE — Telephone Encounter (Signed)
Patient  is currently at [facility] ED. 

## 2020-11-11 NOTE — ED Triage Notes (Signed)
Pt states that he has been having chest pain for the past [redacted] weeks along with some headache, states that the pain increased over night and today. Pt reports that he has a pacemaker, has been seen a few times for syncopal episodes. Pt states that he is having a dull weird tight feeling in his chest

## 2020-11-11 NOTE — ED Provider Notes (Signed)
[facility] Emergency Department Provider Note   ____________________________________________   I have reviewed the triage vital signs and the nursing notes.   HISTORY  Chief Complaint Chest Pain   History limited by: Not Limited   HPI Brandon Lopez is a 65 y.o. male who presents to the emergency department today because of concern for chest pain. It is located in his left chest. It started roughly 2 weeks ago. It has been intermittent. This morning it seemed to be somewhat worse. He was at a home improvement store when it happened. He denies radiation to his neck or left arm. The patient denies any associated shortness of breath.    Records reviewed. Per medical record review patient has a history of COPD, paroxysmal atrial fibrillation.  Past Medical History:  Diagnosis Date   Arthritis    Chronic systolic CHF (congestive heart failure) ([place])    a. echo 6/16: EF 55-60%, Gr1DD, LA nl, PASP nl; b. echo 7/19: EF 40-45%, unable to exclude RWMA, Gr1DD, LA nl, RVSF nl, PASP nl   COPD (chronic obstructive pulmonary disease) (HCC)    Depression    Diverticulosis    Found during colonoscopy   Hypertension    Kidney stones    PAF (paroxysmal atrial fibrillation) ([place])    a. short episodes previously noted on PPM interrogation; b. started on Xarelto 07/2017 given further episodes of Afib noted during interrogation; c. 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 Biotronik; Dayna Barker DR-T [id number]; placed 08/2012   Sleep apnea    Symptomatic bradycardia    a. s/p Biotronik; Dayna Barker DR-T [id number]; placed 08/2012 at [facility]    Patient Active Problem List   Diagnosis Date Noted   Skin lesions 07/03/2020   Chronic back pain 08/17/2019   Chronic shoulder pain 12/12/2018   Rectal bleeding 09/19/2018   Paroxysmal atrial fibrillation ([place]) 10/31/2017    Syncope 08/10/2017   Osteoarthritis 03/17/2017   Edema 07/21/2016   Cigarette smoker 11/17/2015   Diverticulosis 12/16/2014   Chronic obstructive pulmonary disease ([place]) 11/26/2014   Cervical pain (neck) 10/14/2014   Hyperlipidemia 07/11/2014   Preventative health care 07/11/2014   Pacemaker 06/03/2014   Anxiety and depression 06/03/2014   Alcohol abuse 06/03/2014   OSA (obstructive sleep apnea) 06/03/2014   Essential hypertension 06/03/2014   Tobacco abuse 06/03/2014    Past Surgical History:  Procedure Laterality Date   APPENDECTOMY  age 58   CARDIAC CATHETERIZATION     7 years ago   COLONOSCOPY N/A 05/27/2014   Procedure: COLONOSCOPY;  Surgeon: Hulen Luster, MD;  Location: [facility] ENDOSCOPY;  Service: Gastroenterology;  Laterality: N/A;   INSERT / REPLACE / REMOVE PACEMAKER     3 years ago    Prior to Admission medications   Medication Sig Start Date End Date Taking? Authorizing Provider  albuterol (VENTOLIN HFA) 108 (90 Base) MCG/ACT inhaler INHALE 2 PUFFS BY MOUTH EVERY 6 HOURS AS NEEDED FOR WHEEZING AND FOR SHORTNESS OF BREATH 05/26/20   Lesleigh Noe, MD  atorvastatin (LIPITOR) 20 MG tablet Take 1 tablet (20 mg total) by mouth every evening. For cholesterol 03/26/20   Pleas Koch, NP  carvedilol (COREG) 3.125 MG tablet Take 1 tablet by mouth twice daily 05/02/20   Deboraha Sprang, MD  ezetimibe (ZETIA) 10 MG tablet Take 1 tablet by mouth once daily 02/25/20  Minna Merritts, MD  hydrochlorothiazide (HYDRODIURIL) 25 MG tablet Take by mouth.    [provider]  loratadine (CLARITIN) 10 MG tablet Take 10 mg by mouth daily as needed for allergies.     [provider]  meloxicam (MOBIC) 15 MG tablet Take 0.5-1 tablets (7.5-15 mg total) by mouth daily as needed for pain. 05/21/20   Hilts, Legrand Como, MD  SYMBICORT 160-4.5 MCG/ACT inhaler Inhale 2 puffs into the lungs 2 (two) times daily. 10/02/20   Pleas Koch, NP  telmisartan-hydrochlorothiazide (MICARDIS  HCT) 40-12.5 MG tablet Take 1 tablet by mouth once daily for blood pressure 03/21/20   Pleas Koch, NP  varenicline (CHANTIX) 1 MG tablet Take 1 tablet (1 mg total) by mouth 2 (two) times daily. For smoking cessation 07/29/20   Pleas Koch, NP    Allergies Simvastatin and Lisinopril  Family History  Problem Relation Age of Onset   Arthritis Mother    Heart disease Father    Hyperlipidemia Father    Hypertension Father    Cancer Paternal Grandfather     Social History Social History   Tobacco Use   Smoking status: Heavy Smoker    Packs/day: 2.00    Years: 50.00    Pack years: 100.00    Types: Cigarettes    Start date: 1964   Smokeless tobacco: Never   Tobacco comments:    started smoking at age 69, 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  Vaping Use   Vaping Use: Never used  Substance Use Topics   Alcohol use: Yes    Alcohol/week: 8.0 - 14.0 standard drinks    Types: 8 - 14 Cans of beer per week   Drug use: Yes    Types: Marijuana    Review of Systems Constitutional: No fever/chills Eyes: No visual changes. ENT: No sore throat. Cardiovascular: Positive for chest pain. Respiratory: Denies shortness of breath. Gastrointestinal: No abdominal pain.  No nausea, no vomiting.  No diarrhea.   Genitourinary: Negative for dysuria. Musculoskeletal: Negative for back pain. Skin: Negative for rash. Neurological: Negative for headaches, focal weakness or numbness.  ____________________________________________   PHYSICAL EXAM:  VITAL SIGNS: ED Triage Vitals  Enc Vitals Group     BP 11/11/20 1221 133/87     Pulse Rate 11/11/20 1221 70     Resp 11/11/20 1221 18     Temp 11/11/20 1221 97.8 F (36.6 C)     Temp Source 11/11/20 1221 Oral     SpO2 11/11/20 1221 93 %     Weight 11/11/20 1226 180 lb (81.6 kg)     Height 11/11/20 1226 5\' 5"  (1.651 m)     Head Circumference --      Peak Flow --      Pain Score 11/11/20 1226 4   Constitutional: Alert and oriented.  Eyes:  Conjunctivae are normal.  ENT      Head: Normocephalic and atraumatic.      Nose: No congestion/rhinnorhea.      Mouth/Throat: Mucous membranes are moist.      Neck: No stridor. Hematological/Lymphatic/Immunilogical: No cervical lymphadenopathy. Cardiovascular: Normal rate, regular rhythm.  No murmurs, rubs, or gallops.  Respiratory: Normal respiratory effort without tachypnea nor retractions. Breath sounds are clear and equal bilaterally. No wheezes/rales/rhonchi. Gastrointestinal: Soft and non tender. No rebound. No guarding.  Genitourinary: Deferred Musculoskeletal: Normal range of motion in all extremities. No lower extremity edema. Neurologic:  Normal speech and language. No gross focal neurologic deficits are appreciated.  Skin:  Skin  is warm, dry and intact. No rash noted. Psychiatric: Mood and affect are normal. Speech and behavior are normal. Patient exhibits appropriate insight and judgment.  ____________________________________________    LABS (pertinent positives/negatives)  BMP wnl Trop hs 3 CBC wbc 9.7, hgb 16.1, plt 266  ____________________________________________   EKG  I, Nance Pear, attending physician, personally viewed and interpreted this EKG  EKG Time: 1218 Rate: 71 Rhythm: atrial paced rhythm with prolonged av conduction Axis: right axis deviation Intervals: qtc 410 QRS: low voltage qrs, q waves v1, v2, v3 ST changes: no st elevation Impression: abnormal ekg ____________________________________________    RADIOLOGY  CXR No acute abnormality  ____________________________________________   PROCEDURES  Procedures  ____________________________________________   INITIAL IMPRESSION / ASSESSMENT AND PLAN / ED COURSE  Pertinent labs & imaging results that were available during my care of the patient were reviewed by me and considered in my medical decision making (see chart for details).   Patient presented to the emergency department  today because of concern for chest pain that has been intermittent for the past 2 weeks. Work up here without concerning abnormality. Troponin was negative. CXR without pna, ptx. Doubt PE or dissection given clinical history. Did discuss possibility of strain to his heart and important follow up with cardiology. Did also discuss possibility of interrogating pacemaker here however patient felt comfortable deferring to talk to his cardiologist.  ____________________________________________   FINAL CLINICAL IMPRESSION(S) / ED DIAGNOSES  Final diagnoses:  Nonspecific chest pain     Note: This dictation was prepared with Dragon dictation. Any transcriptional errors that result from this process are unintentional     Nance Pear, MD 11/11/20 1758

## 2020-11-11 NOTE — ED Notes (Signed)
See triage note  presents with intermittent chest pain for about 2 weeks  also has had headache  denies any fever

## 2020-11-12 ENCOUNTER — Telehealth: Payer: Self-pay | Admitting: Cardiovascular Disease

## 2020-11-12 ENCOUNTER — Telehealth: Payer: Self-pay | Admitting: Physical Medicine and Rehabilitation

## 2020-11-12 NOTE — Telephone Encounter (Signed)
Noted, thank you Will wait for pt to call back with results and make appt if needed.

## 2020-11-12 NOTE — Telephone Encounter (Signed)
Patient calling to schedule stress test advised by ED at dc for chest pain yesterday   Patient advised provider will review and fu   Patient declined to schedule appt at this time until reviewed and advised

## 2020-11-12 NOTE — Telephone Encounter (Signed)
Patient called. Returning a call to [place] 

## 2020-11-13 NOTE — Telephone Encounter (Signed)
I called and spoke with the patient. He advised that he was sitting in bed last night watching TV with he girlfriend. He turned to sit on the side of the bed and starting coughing. At that point, he passed out and fell forward off the bed hitting his face and breaking his glasses.   He was concerned about his PPM functioning appropriately. I advised the patient I had reached out to our [facility] team and his transmission from last night showed normal device function and no arrhythmias.   I advised him that he syncope sounded vasovagal in nature.  I inquired about his chest pain status. He states this has been going on for ~ 1-2 weeks. The pain is left sided/ just to the left of his heart.  He was seen and evaluated in the ER on Tuesday this week and advised stress testing may be appropriate for him.  He called the office earlier this week to inquire what Dr. Donivan Scull thoughts were on stress testing.  I advised him that I did have a provider with an opening today and that we could bring him in for further evaluation, but he declined stating he is currently in [place] and may not be back in time for an appointment. He advised he preferred to know Dr. Donivan Scull thoughts on a him having a stress test or further recommendations.  I advised the patient, we will forward to Dr. Rockey Situ to review and call him back with further recommendations.   The patient voices understanding and is agreeable.

## 2020-11-13 NOTE — Telephone Encounter (Signed)
Nightly PPM report reviewed. Report received 11/13/20 @ 0118.  Normal device function.  No arrhythmia episodes detected.

## 2020-11-13 NOTE — Telephone Encounter (Signed)
Patient called and states he blacked out last night and fell face down.  Patient states he was in the ED on Tuesday  Pt c/o Syncope: STAT if syncope occurred within 30 minutes and pt complains of lightheadedness High Priority if episode of passing out, completely, today or in last 24 hours   Did you pass out today? Last night   When is the last time you passed out? Last night  Has this occurred multiple times?  Yes, 2 or 3 , but has been a while, he thinks about a year ago.   Did you have any symptoms prior to passing out? Just sitting in the bed and fell asleep and then sat on the edge of the bed and then passed out and fell in the floor, hit his head, broke his glasses. EMT came and assessed and said he was "fine".  Patient thinks it may be something with his pacemaker.

## 2020-11-25 ENCOUNTER — Ambulatory Visit: Payer: PPO | Admitting: Physical Medicine and Rehabilitation

## 2020-11-25 ENCOUNTER — Other Ambulatory Visit: Payer: Self-pay

## 2020-11-25 ENCOUNTER — Encounter: Payer: Self-pay | Admitting: Physical Medicine and Rehabilitation

## 2020-11-25 VITALS — BP 139/97 | HR 77

## 2020-11-25 DIAGNOSIS — M5116 Intervertebral disc disorders with radiculopathy, lumbar region: Secondary | ICD-10-CM | POA: Diagnosis not present

## 2020-11-25 DIAGNOSIS — M5441 Lumbago with sciatica, right side: Secondary | ICD-10-CM

## 2020-11-25 DIAGNOSIS — G8929 Other chronic pain: Secondary | ICD-10-CM | POA: Diagnosis not present

## 2020-11-25 DIAGNOSIS — M5416 Radiculopathy, lumbar region: Secondary | ICD-10-CM | POA: Diagnosis not present

## 2020-11-25 DIAGNOSIS — D1779 Benign lipomatous neoplasm of other sites: Secondary | ICD-10-CM | POA: Diagnosis not present

## 2020-11-25 DIAGNOSIS — M48061 Spinal stenosis, lumbar region without neurogenic claudication: Secondary | ICD-10-CM

## 2020-11-25 NOTE — Patient Instructions (Signed)

## 2020-11-25 NOTE — Progress Notes (Signed)
Pt state lower back pain that travels to his right leg. Pt state the pain makes the right leg numb. Pt state standing for long time working and then sitting down to rest he feels the pain worse. Pt state he takes pain meds to help ease his pain.  Numeric Pain Rating Scale and Functional Assessment Average Pain 6 Pain Right Now 2 My pain is intermittent and burning Pain is worse with: standing and some activites Pain improves with: rest and medication   In the last MONTH (on 0-10 scale) has pain interfered with the following?  1. General activity like being  able to carry out your everyday physical activities such as walking, climbing stairs, carrying groceries, or moving a chair?  Rating(4)  2. Relation with others like being able to carry out your usual social activities and roles such as  activities at home, at work and in your community. Rating(5)  3. Enjoyment of life such that you have  been bothered by emotional problems such as feeling anxious, depressed or irritable?  Rating(6)

## 2020-11-25 NOTE — Progress Notes (Signed)
Brandon Lopez - 65 y.o. male MRN [id number]  Date of birth: 20-Apr-1955  Office Visit Note: Visit Date: 11/25/2020 PCP: Pleas Koch, NP Referred by: Pleas Koch, NP  Subjective: Chief Complaint  Patient presents with   Lower Back - Pain   HPI: Brandon Lopez is a 65 y.o. male who comes in today per the request of Benjiman Core, [place] for evaluation of chronic bilateral lower back pain radiating down right anterior leg to foot. 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 Patient reports pain is exacerbated by prolonged sitting and is relieved by walking for several minutes. Patient also reports some pain relief with use of Meloxicam at home. Patient does have a pacemaker, recently had a lumbar [facility] performed at [facility]. Lumbar MRI exhibits epidural lipomatosis extending from L1 to S1, right extraforaminal disc bulge at L5-S1. No high grade spinal canal stenosis noted. Patient was previously treated by Dr. Eunice Blase whom referred him to formal physical therapy at [facility] in [place], [place] and does report some alleviation of pain with these treatments. Patient states he is a very active person and does help to care for his neighbor. Patient states he feels good at this time and feels like his back pain is manageable at home. Patient denies focal weakness, numbness and tingling. Patient denies recent trauma or falls.   Review of Systems  Neurological:  Negative for tingling, sensory change, focal weakness and weakness.  All other systems reviewed and are negative. Otherwise per HPI.  Assessment & Plan: Visit Diagnoses:    ICD-10-CM   1. Lumbar radiculopathy  M54.16     2. Intervertebral disc disorders with radiculopathy, lumbar region  M51.16     3. Chronic bilateral low back pain with right-sided sciatica  M54.41    G89.29     4. Foraminal  stenosis of lumbar region  M48.061     5. 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 Patient is currently pain free at this time. His pain has subsided since recent lower back strain several weeks ago. We discussed patient's recent lumbar MRI today in detail using report and spine model. We feel the next step is to monitor patient, if his pain returns we would consider performing right L5 transforaminal epidural steroid injection under fluoroscopic guidance. We also discussed physician directed home exercise program today. We gave patient educational literature regarding Dr. Nicole Kindred McGill's Big 3 exercises and instructed patient to perform these at home as tolerated to help strengthen back. We also discussed possibly re-grouping with our in house physical therapy if warranted. Patient instructed to follow-up with [place] as needed. No red flag symptoms noted upon exam today.    Meds & Orders: No orders of the defined types were placed in this encounter.  No orders of the defined types were placed in this encounter.   Follow-up: Return if symptoms worsen or fail to improve.   Procedures: No procedures performed      Clinical History: 08/13/2020 4:54 PM EDT   EXAM: Magnetic resonance imaging, spinal canal and contents, lumbar, without contrast material. DATE: 08/13/2020 3:20 PM ACCESSION: [id number] UN DICTATED: 08/13/2020 3:32 PM INTERPRETATION LOCATION: [place]  CLINICAL INDICATION: 66 years old Male with acute back pain, right leg numbness/weakness ;  Low back pain, > 6 wks  - M54.50 - Acute low back pain without sciatica, unspecified back pain laterality    COMPARISON: None available  TECHNIQUE: Multiplanar MRI was performed through the lumbar spine without intravenous contrast.  FINDINGS:  Bone marrow signal intensity is normal. The visualized cord is unremarkable and the conus medullaris  ends at a normal level.  The vertebral bodies are normally aligned. Disc spaces are preserved. There is generalized epidural lipomatosis originating at the approximate level of L1 extending to S1. There is anterior longitudinal ligament defects with fatty infiltration adjacent to the vertebral bodies of L3 down to S1.  L1-L2: Generalized disc bulge with bilateral subarticular zone spinal canal narrowing. Mild bilateral neural foraminal narrowing.  L2-L3: Generalized disc bulging with mild bilateral foraminal zone spinal canal narrowing. Mild right neural foraminal narrowing.  L3-L4: There is disc bulge in the right foraminal zone with mild spinal canal narrowing. Mild right neural foraminal narrowing.  L4-L5: Mild right foraminal disc bulge with mild spinal canal narrowing. Mild right neural foraminal narrowing.  L5-S1: Right side extraforaminal disc bulge spinal canal narrowing. Moderate bilateral neural foraminal narrowing.  The paraspinal tissues are within normal limits.  For the purposes of this dictation, the lowest well formed intervertebral disc space is assumed to be the L5-S1 level, and there are presumed to be five lumbar-type vertebral bodies.   He reports that he has been smoking cigarettes. He started smoking about 58 years ago. He has a 100.00 pack-year smoking history. He has never used smokeless tobacco.  Recent Labs    01/11/20 1227  HGBA1C 5.6    Objective:  VS:  HT:    WT:   BMI:     BP: (!) 139/97  HR:77bpm  TEMP: ( )  RESP:  Physical Exam Vitals and nursing note reviewed.  HENT:     Head: Normocephalic and atraumatic.     Right Ear: External ear normal.     Left Ear: External ear normal.     Nose: Nose normal.     Mouth/Throat:     Mouth: Mucous membranes are moist.  Eyes:     Extraocular Movements: Extraocular movements intact.  Cardiovascular:     Rate and Rhythm: Normal rate.     Pulses: Normal pulses.  Pulmonary:     Effort: Pulmonary effort  is normal.  Abdominal:     General: Abdomen is flat. There is no distension.  Musculoskeletal:        General: Normal range of motion.     Cervical back: Normal range of motion.     Comments: Pt rises from seated position to standing without difficulty. Good lumbar range of motion. Strong distal strength without clonus, no pain upon palpation of greater trochanters. Sensation intact bilaterally. Walks independently, gait steady.     Skin:    General: Skin is warm and dry.     Capillary Refill: Capillary refill takes less than 2 seconds.  Neurological:     General: No focal deficit present.     Mental Status: He is alert.  Psychiatric:        Mood and Affect: Mood normal.    Ortho Exam  Imaging: No results found.  Past Medical/Family/Surgical/Social History: Medications & Allergies reviewed per EMR, new medications updated. Patient Active Problem List   Diagnosis Date Noted   Skin lesions 07/03/2020   Chronic back pain 08/17/2019   Chronic shoulder pain 12/12/2018   Rectal bleeding 09/19/2018   Paroxysmal atrial fibrillation (  [place]) 10/31/2017   Syncope 08/10/2017   Osteoarthritis 03/17/2017   Edema 07/21/2016   Cigarette smoker 11/17/2015   Diverticulosis 12/16/2014   Chronic obstructive pulmonary disease ([place]) 11/26/2014   Cervical pain (neck) 10/14/2014   Hyperlipidemia 07/11/2014   Preventative health care 07/11/2014   Pacemaker 06/03/2014   Anxiety and depression 06/03/2014   Alcohol abuse 06/03/2014   OSA (obstructive sleep apnea) 06/03/2014   Essential hypertension 06/03/2014   Tobacco abuse 06/03/2014   Past Medical History:  Diagnosis Date   Arthritis    Chronic systolic CHF (congestive heart failure) ([place])    a. echo 6/16: EF 55-60%, Gr1DD, LA nl, PASP nl; b. echo 7/19: EF 40-45%, unable to exclude RWMA, Gr1DD, LA nl, RVSF nl, PASP nl   COPD (chronic obstructive pulmonary disease) ([place])    Depression    Diverticulosis    Found during colonoscopy    Hypertension    Kidney stones    PAF (paroxysmal atrial fibrillation) ([place])    a. short episodes previously noted on PPM interrogation; b. started on Xarelto 07/2017 given further episodes of Afib noted during interrogation; c. 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 Biotronik; Dayna Barker DR-T [id number]; placed 08/2012   Sleep apnea    Symptomatic bradycardia    a. s/p Biotronik; Dayna Barker DR-T [id number]; placed 08/2012 at [facility] History  Problem Relation Age of Onset   Arthritis Mother    Heart disease Father    Hyperlipidemia Father    Hypertension Father    Cancer Paternal Grandfather    Past Surgical History:  Procedure Laterality Date   APPENDECTOMY  age 89   CARDIAC CATHETERIZATION     7 years ago   COLONOSCOPY N/A 05/27/2014   Procedure: COLONOSCOPY;  Surgeon: Hulen Luster, MD;  Location[facility]COPY;  Service: Gastroenterology;  Laterality: N/A;   INSERT / REPLACE / REMOVE PACEMAKER     3 years ago   Social History   Occupational History   Not on file  Tobacco Use   Smoking status: Heavy Smoker    Packs/day: 2.00    Years: 50.00    Pack years: 100.00    Types: Cigarettes    Start date: 1964   Smokeless tobacco: Never   Tobacco comments:    started smoking at age 77, 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  Vaping Use   Vaping Use: Never used  Substance and Sexual Activity   Alcohol use: Yes    Alcohol/week: 8.0 - 14.0 standard drinks    Types: 8 - 14 Cans of beer per week   Drug use: Yes    Types: Marijuana   Sexual activity: Not on file

## 2020-12-03 ENCOUNTER — Other Ambulatory Visit: Payer: Self-pay | Admitting: Primary Care

## 2020-12-03 DIAGNOSIS — E785 Hyperlipidemia, unspecified: Secondary | ICD-10-CM

## 2020-12-04 NOTE — Telephone Encounter (Signed)
I see that he is scheduled for CPE on 12/11/20, but his last CPE was 01/10/21. Do we need to change?

## 2020-12-09 ENCOUNTER — Ambulatory Visit (INDEPENDENT_AMBULATORY_CARE_PROVIDER_SITE_OTHER): Payer: PPO

## 2020-12-09 DIAGNOSIS — I495 Sick sinus syndrome: Secondary | ICD-10-CM | POA: Diagnosis not present

## 2020-12-09 LAB — CUP PACEART REMOTE DEVICE CHECK
Date Time Interrogation Session: 20221115083642
Implantable Lead Implant Date: 20140801
Implantable Lead Implant Date: 20140801
Implantable Lead Location: 753859
Implantable Lead Location: 753860
Implantable Lead Model: 362
Implantable Lead Serial Number: 29356730
Implantable Lead Serial Number: 29392661
Implantable Pulse Generator Implant Date: 20140801
Pulse Gen Serial Number: 68053398

## 2020-12-11 ENCOUNTER — Other Ambulatory Visit: Payer: Self-pay

## 2020-12-11 ENCOUNTER — Ambulatory Visit (INDEPENDENT_AMBULATORY_CARE_PROVIDER_SITE_OTHER): Payer: PPO | Admitting: Primary Care

## 2020-12-11 ENCOUNTER — Encounter: Payer: Self-pay | Admitting: Primary Care

## 2020-12-11 VITALS — BP 136/68 | HR 72 | Temp 97.2°F | Ht 65.0 in | Wt 187.0 lb

## 2020-12-11 DIAGNOSIS — G4733 Obstructive sleep apnea (adult) (pediatric): Secondary | ICD-10-CM

## 2020-12-11 DIAGNOSIS — M150 Primary generalized (osteo)arthritis: Secondary | ICD-10-CM

## 2020-12-11 DIAGNOSIS — I10 Essential (primary) hypertension: Secondary | ICD-10-CM

## 2020-12-11 DIAGNOSIS — E785 Hyperlipidemia, unspecified: Secondary | ICD-10-CM

## 2020-12-11 DIAGNOSIS — J449 Chronic obstructive pulmonary disease, unspecified: Secondary | ICD-10-CM

## 2020-12-11 DIAGNOSIS — R609 Edema, unspecified: Secondary | ICD-10-CM

## 2020-12-11 DIAGNOSIS — I480 Paroxysmal atrial fibrillation: Secondary | ICD-10-CM | POA: Diagnosis not present

## 2020-12-11 DIAGNOSIS — F419 Anxiety disorder, unspecified: Secondary | ICD-10-CM | POA: Diagnosis not present

## 2020-12-11 DIAGNOSIS — F1010 Alcohol abuse, uncomplicated: Secondary | ICD-10-CM

## 2020-12-11 DIAGNOSIS — F17200 Nicotine dependence, unspecified, uncomplicated: Secondary | ICD-10-CM

## 2020-12-11 DIAGNOSIS — M159 Polyosteoarthritis, unspecified: Secondary | ICD-10-CM

## 2020-12-11 DIAGNOSIS — Z23 Encounter for immunization: Secondary | ICD-10-CM

## 2020-12-11 DIAGNOSIS — Z125 Encounter for screening for malignant neoplasm of prostate: Secondary | ICD-10-CM | POA: Diagnosis not present

## 2020-12-11 DIAGNOSIS — M545 Low back pain, unspecified: Secondary | ICD-10-CM

## 2020-12-11 DIAGNOSIS — F32A Depression, unspecified: Secondary | ICD-10-CM

## 2020-12-11 DIAGNOSIS — Z0000 Encounter for general adult medical examination without abnormal findings: Secondary | ICD-10-CM

## 2020-12-11 DIAGNOSIS — Z950 Presence of cardiac pacemaker: Secondary | ICD-10-CM | POA: Diagnosis not present

## 2020-12-11 DIAGNOSIS — G8929 Other chronic pain: Secondary | ICD-10-CM

## 2020-12-11 LAB — CBC
HCT: 46 % (ref 39.0–52.0)
Hemoglobin: 15.6 g/dL (ref 13.0–17.0)
MCHC: 34 g/dL (ref 30.0–36.0)
MCV: 95.3 fl (ref 78.0–100.0)
Platelets: 225 10*3/uL (ref 150.0–400.0)
RBC: 4.83 Mil/uL (ref 4.22–5.81)
RDW: 13.7 % (ref 11.5–15.5)
WBC: 8.6 10*3/uL (ref 4.0–10.5)

## 2020-12-11 LAB — LIPID PANEL
Cholesterol: 145 mg/dL (ref 0–200)
HDL: 46.2 mg/dL (ref >39.00)
LDL Cholesterol: 72 mg/dL (ref 0–99)
NonHDL: 99.27
Total CHOL/HDL Ratio: 3
Triglycerides: 136 mg/dL (ref 0.0–149.0)
VLDL: 27.2 mg/dL (ref 0.0–40.0)

## 2020-12-11 LAB — COMPREHENSIVE METABOLIC PANEL
ALT: 29 U/L (ref 0–53)
AST: 21 U/L (ref 0–37)
Albumin: 4.2 g/dL (ref 3.5–5.2)
Alkaline Phosphatase: 82 U/L (ref 39–117)
BUN: 13 mg/dL (ref 6–23)
CO2: 30 mEq/L (ref 19–32)
Calcium: 9.3 mg/dL (ref 8.4–10.5)
Chloride: 100 mEq/L (ref 96–112)
Creatinine, Ser: 0.85 mg/dL (ref 0.40–1.50)
GFR: 91.35 mL/min (ref >60.00)
Glucose, Bld: 88 mg/dL (ref 70–99)
Potassium: 4.1 mEq/L (ref 3.5–5.1)
Sodium: 136 mEq/L (ref 135–145)
Total Bilirubin: 0.7 mg/dL (ref 0.2–1.2)
Total Protein: 6.6 g/dL (ref 6.0–8.3)

## 2020-12-11 LAB — PSA, MEDICARE: PSA: 0.26 ng/ml (ref 0.10–4.00)

## 2020-12-11 NOTE — Patient Instructions (Signed)
Stop by the lab prior to leaving today. I will notify you of your results once received.   You are due for lung cancer screening in May 2023.  Please follow up with your heart doctor and lung doctor. You need to resume the CPAP machine.  It was a pleasure to see you today!  Preventive Care 66 Years and Older, Male Preventive care refers to lifestyle choices and visits with your health care provider that can promote health and wellness. Preventive care visits are also called wellness exams. What can I expect for my preventive care visit? Counseling During your preventive care visit, your health care provider may ask about your: Medical history, including: Past medical problems. Family medical history. History of falls. Current health, including: Emotional well-being. Home life and relationship well-being. Sexual activity. Memory and ability to understand (cognition). Lifestyle, including: Alcohol, nicotine or tobacco, and drug use. Access to firearms. Diet, exercise, and sleep habits. Work and work Statistician. Sunscreen use. Safety issues such as seatbelt and bike helmet use. Physical exam Your health care provider will check your: Height and weight. These may be used to calculate your BMI (body mass index). BMI is a measurement that tells if you are at a healthy weight. Waist circumference. This measures the distance around your waistline. This measurement also tells if you are at a healthy weight and may help predict your risk of certain diseases, such as type 2 diabetes and high blood pressure. Heart rate and blood pressure. Body temperature. Skin for abnormal spots. What immunizations do I need? Vaccines are usually given at various ages, according to a schedule. Your health care provider will recommend vaccines for you based on your age, medical history, and lifestyle or other factors, such as travel or where you work. What tests do I need? Screening Your health care  provider may recommend screening tests for certain conditions. This may include: Lipid and cholesterol levels. Diabetes screening. This is done by checking your blood sugar (glucose) after you have not eaten for a while (fasting). Hepatitis C test. Hepatitis B test. HIV (human immunodeficiency virus) test. STI (sexually transmitted infection) testing, if you are at risk. Lung cancer screening. Colorectal cancer screening. Prostate cancer screening. Abdominal aortic aneurysm (AAA) screening. You may need this if you are a current or former smoker. Talk with your health care provider about your test results, treatment options, and if necessary, the need for more tests. Follow these instructions at home: Eating and drinking  Eat a diet that includes fresh fruits and vegetables, whole grains, lean protein, and low-fat dairy products. Limit your intake of foods with high amounts of sugar, saturated fats, and salt. Take vitamin and mineral supplements as recommended by your health care provider. Do not drink alcohol if your health care provider tells you not to drink. If you drink alcohol: Limit how much you have to 0-2 drinks a day. Know how much alcohol is in your drink. In the [place] one drink equals one 12 oz bottle of beer (355 mL), one 5 oz glass of wine (148 mL), or one 1 oz glass of hard liquor (44 mL). Lifestyle Brush your teeth every morning and night with fluoride toothpaste. Floss one time each day. Exercise for at least 30 minutes 5 or more days each week. Do not use any products that contain nicotine or tobacco. These products include cigarettes, chewing tobacco, and vaping devices, such as e-cigarettes. If you need help quitting, ask your health care provider. Do not use drugs.  If you are sexually active, practice safe sex. Use a condom or other form of protection to prevent STIs. Take aspirin only as told by your health care provider. Make sure that you understand how much to  take and what form to take. Work with your health care provider to find out whether it is safe and beneficial for you to take aspirin daily. Ask your health care provider if you need to take a cholesterol-lowering medicine (statin). Find healthy ways to manage stress, such as: Meditation, yoga, or listening to music. Journaling. Talking to a trusted person. Spending time with friends and family. Safety Always wear your seat belt while driving or riding in a vehicle. Do not drive: If you have been drinking alcohol. Do not ride with someone who has been drinking. When you are tired or distracted. While texting. If you have been using any mind-altering substances or drugs. Wear a helmet and other protective equipment during sports activities. If you have firearms in your house, make sure you follow all gun safety procedures. Minimize exposure to UV radiation to reduce your risk of skin cancer. What's next? Visit your health care provider once a year for an annual wellness visit. Ask your health care provider how often you should have your eyes and teeth checked. Stay up to date on all vaccines. This information is not intended to replace advice given to you by your health care provider. Make sure you discuss any questions you have with your health care provider. Document Revised: 07/09/2020 Document Reviewed: 07/09/2020 Elsevier Patient Education  [place].

## 2020-12-11 NOTE — Assessment & Plan Note (Signed)
Compliant to atorvastatin 20 gm and Zetia 10 mg, continue same. Repeat lipid panel pending.

## 2020-12-11 NOTE — Assessment & Plan Note (Signed)
Chronic and continued. Drinking 5-6 beers and 1/4-1/2 pint of liquor daily.  Strongly advised he cutback and quit.

## 2020-12-11 NOTE — Assessment & Plan Note (Signed)
Failed Chantix, nicotine patches do help. Offered to prescribe nicotine patches and he kindly declines.  He will update when ready.  Lung cancer screening UTD, due May 2023.

## 2020-12-11 NOTE — Assessment & Plan Note (Signed)
Prevnar 20 due and provided today. Other vaccines UTD. PSA due and pending. Colonoscopy UTD, due 2026.  Discussed the importance of a healthy diet and regular exercise in order for weight loss, and to reduce the risk of further co-morbidity.  Exam today stable. Labs pending.

## 2020-12-11 NOTE — Progress Notes (Signed)
Subjective:    Patient ID: Brandon Lopez, male    DOB: 07-06-1955, 65 y.o.   MRN: [id number]  HPI  Brandon Lopez is a very pleasant 65 y.o. male who presents today for complete physical and follow up of chronic conditions.  Immunizations: -Tetanus: 2013 -Influenza: Due -Covid-19: Has not completed  -Shingles: Zostavax -Pneumonia: Pneumovax in 2016   Diet: Johnson. Lots of take out and eating out.  Exercise: No regular exercise. Active at home.   Eye exam: Completes annually  Dental exam: Completed in January 2022  Colonoscopy: Completed in 2016, due 2026 PSA: Due  Lung Cancer Screening: Completed in May 2022, due in 2023  BP Readings from Last 3 Encounters:  12/11/20 136/68  11/25/20 (!) 139/97  11/11/20 130/84   Wt Readings from Last 3 Encounters:  12/11/20 187 lb (84.8 kg)  11/11/20 180 lb (81.6 kg)  07/03/20 186 lb (84.4 kg)         Review of Systems  Constitutional:  Negative for unexpected weight change.  HENT:  Negative for rhinorrhea.   Eyes:  Negative for visual disturbance.  Respiratory:  Negative for shortness of breath.   Cardiovascular:  Negative for chest pain.  Gastrointestinal:  Negative for constipation and diarrhea.  Genitourinary:  Negative for difficulty urinating.  Musculoskeletal:  Positive for arthralgias. Negative for myalgias.  Skin:  Negative for rash.  Allergic/Immunologic: Negative for environmental allergies.  Neurological:  Negative for dizziness and headaches.  Psychiatric/Behavioral:  The patient is not nervous/anxious.         Past Medical History:  Diagnosis Date   Arthritis    Chronic systolic CHF (congestive heart failure) ([place])    a. echo 6/16: EF 55-60%, Gr1DD, LA nl, PASP nl; b. echo 7/19: EF 40-45%, unable to exclude RWMA, Gr1DD, LA nl, RVSF nl, PASP nl   COPD (chronic obstructive pulmonary disease) (H[facility])    Depression    Diverticulosis    Found during colonoscopy   Hypertension    Kidney  stones    PAF (paroxysmal atrial fibrillation) ([place])    a. short episodes previously noted on PPM interrogation; b. started on Xarelto 07/2017 given further episodes of Afib noted during interrogation; c. 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 Biotronik; Dayna Barker DR-T [id number]; placed 08/2012   Sleep apnea    Symptomatic bradycardia    a. s/p Biotronik; Dayna Barker DR-T [id number]; placed 08/2012 at [facility] History   Socioeconomic History   Marital status: Single    Spouse name: Not on file   Number of children: Not on file   Years of education: Not on file   Highest education level: Not on file  Occupational History   Not on file  Tobacco Use   Smoking status: Heavy Smoker    Packs/day: 2.00    Years: 50.00    Pack years: 100.00    Types: Cigarettes    Start date: 1964   Smokeless tobacco: Never   Tobacco comments:    started smoking at age 43, 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  Vaping Use   Vaping Use: Never used  Substance and Sexual Activity   Alcohol use: Yes    Alcohol/week: 8.0 - 14.0 standard drinks    Types: 8 - 14 Cans of beer per week   Drug use: Yes    Types: Marijuana  Sexual activity: Not on file  Other Topics Concern   Not on file  Social History Narrative   Not on file   Social Determinants of Health   Financial Resource Strain: Low Risk    Difficulty of Paying Living Expenses: Not hard at all  Food Insecurity: No Food Insecurity   Worried About Running Out of Food in the Last Year: Never true   [place] in the Last Year: Never true  Transportation Needs: No Transportation Needs   Lack of Transportation (Medical): No   Lack of Transportation (Non-Medical): No  Physical Activity: Inactive   Days of Exercise per Week: 0 days   Minutes of Exercise per Session: 0 min  Stress: Stress Concern Present   Feeling of Stress : Very much   Social Connections: Not on file  Intimate Partner Violence: Not At Risk   Fear of Current or Ex-Partner: No   Emotionally Abused: No   Physically Abused: No   Sexually Abused: No    Past Surgical History:  Procedure Laterality Date   APPENDECTOMY  age 82   CARDIAC CATHETERIZATION     7 years ago   COLONOSCOPY N/A 05/27/2014   Procedure: COLONOSCOPY;  Surgeon: Hulen Luster, MD;  [facility] ENDOSCOPY;  Service: Gastroenterology;  Laterality: N/A;   INSERT / REPLACE / REMOVE PACEMAKER     3 years ago    Family History  Problem Relation Age of Onset   Arthritis Mother    Heart disease Father    Hyperlipidemia Father    Hypertension Father    Cancer Paternal Grandfather     Allergies  Allergen Reactions   Simvastatin Diarrhea   Lisinopril Cough    Current Outpatient Medications on File Prior to Visit  Medication Sig Dispense Refill   albuterol (VENTOLIN HFA) 108 (90 Base) MCG/ACT inhaler INHALE 2 PUFFS BY MOUTH EVERY 6 HOURS AS NEEDED FOR WHEEZING AND FOR SHORTNESS OF BREATH 18 g 0   aspirin EC 81 MG tablet Take 81 mg by mouth daily. Swallow whole.     atorvastatin (LIPITOR) 20 MG tablet Take 1 tablet (20 mg total) by mouth daily. For cholesterol. Office visit required for further refills. 90 tablet 0   carvedilol (COREG) 3.125 MG tablet Take 1 tablet by mouth twice daily 180 tablet 2   ezetimibe (ZETIA) 10 MG tablet Take 1 tablet by mouth once daily 90 tablet 3   hydrochlorothiazide (HYDRODIURIL) 25 MG tablet Take by mouth.     loratadine (CLARITIN) 10 MG tablet Take 10 mg by mouth daily as needed for allergies.      meloxicam (MOBIC) 15 MG tablet Take 0.5-1 tablets (7.5-15 mg total) by mouth daily as needed for pain. 30 tablet 6   SYMBICORT 160-4.5 MCG/ACT inhaler Inhale 2 puffs into the lungs 2 (two) times daily. 33 g 0   telmisartan-hydrochlorothiazide (MICARDIS HCT) 40-12.5 MG tablet Take 1 tablet by mouth once daily for blood pressure 90 tablet 3   No current  facility-administered medications on file prior to visit.    BP 136/68   Pulse 72   Temp (!) 97.2 F (36.2 C) (Temporal)   Ht 5\' 5"  (1.651 m)   Wt 187 lb (84.8 kg)   SpO2 98%   BMI 31.12 kg/m  Objective:   Physical Exam HENT:     Right Ear: Tympanic membrane and ear canal normal.     Left Ear: Tympanic membrane and ear canal normal.     Nose:  Nose normal.     Right Sinus: No maxillary sinus tenderness or frontal sinus tenderness.     Left Sinus: No maxillary sinus tenderness or frontal sinus tenderness.  Eyes:     Conjunctiva/sclera: Conjunctivae normal.  Neck:     Thyroid: No thyromegaly.     Vascular: No carotid bruit.  Cardiovascular:     Rate and Rhythm: Normal rate and regular rhythm.     Heart sounds: Normal heart sounds.  Pulmonary:     Effort: Pulmonary effort is normal.     Breath sounds: Normal breath sounds. No wheezing or rales.  Abdominal:     General: Bowel sounds are normal.     Palpations: Abdomen is soft.     Tenderness: There is no abdominal tenderness.  Musculoskeletal:        General: Normal range of motion.     Cervical back: Neck supple.  Skin:    General: Skin is warm and dry.  Neurological:     Mental Status: He is alert and oriented to person, place, and time.     Cranial Nerves: No cranial nerve deficit.     Deep Tendon Reflexes: Reflexes are normal and symmetric.  Psychiatric:        Mood and Affect: Mood normal.          Assessment & Plan:      This visit occurred during the SARS-CoV-2 public health emergency.  Safety protocols were in place, including screening questions prior to the visit, additional usage of staff PPE, and extensive cleaning of exam room while observing appropriate contact time as indicated for disinfecting solutions.

## 2020-12-11 NOTE — Assessment & Plan Note (Signed)
Stable. Continue Meloxicam 15 mg PRN.

## 2020-12-11 NOTE — Assessment & Plan Note (Signed)
Appears stable. Remote pacer check reviewed from 12/09/20.  Follows with electrophysiology

## 2020-12-11 NOTE — Assessment & Plan Note (Signed)
No CPAP machine in years, he does not wish to pursue despite recommendations.   He will discuss with cardiology.

## 2020-12-11 NOTE — Assessment & Plan Note (Signed)
Stable per patient, denies concerns today. Continue to monitor.

## 2020-12-11 NOTE — Assessment & Plan Note (Signed)
Stable. No recent use of Meloxicam 15 mg. Continue PRN use.

## 2020-12-11 NOTE — Assessment & Plan Note (Addendum)
Rate and rhythm regular today. Pacemaker in place.  Continue carvedilol 3.125 mg BID and aspirin.  Follows with cardiology.

## 2020-12-11 NOTE — Assessment & Plan Note (Signed)
Resolved.  None noted on exam.

## 2020-12-11 NOTE — Assessment & Plan Note (Signed)
Overall stable in the office today. Continue carvedilol 3.125 mg BID, telmisartan-HCTZ 40-12.5 mg daily.   CMP pending.

## 2020-12-11 NOTE — Addendum Note (Signed)
Addended by: Francella Solian on: 12/11/2020 11:46 AM   Modules accepted: Orders

## 2020-12-11 NOTE — Assessment & Plan Note (Signed)
Doing well on Symbicort 160-4.5 2 puffs BID, infrequent use of albuterol inhaler.  Lung cancer screening UTD, due in May 2023.

## 2020-12-12 NOTE — Telephone Encounter (Signed)
Attempted to schedule no ans no vm  

## 2020-12-17 NOTE — Progress Notes (Signed)
Remote pacemaker transmission.   

## 2020-12-23 DIAGNOSIS — Z7951 Long term (current) use of inhaled steroids: Secondary | ICD-10-CM | POA: Diagnosis not present

## 2020-12-23 DIAGNOSIS — J449 Chronic obstructive pulmonary disease, unspecified: Secondary | ICD-10-CM | POA: Diagnosis not present

## 2020-12-23 DIAGNOSIS — I739 Peripheral vascular disease, unspecified: Secondary | ICD-10-CM | POA: Diagnosis not present

## 2020-12-23 DIAGNOSIS — I4891 Unspecified atrial fibrillation: Secondary | ICD-10-CM | POA: Diagnosis not present

## 2020-12-23 DIAGNOSIS — Z6830 Body mass index (BMI) 30.0-30.9, adult: Secondary | ICD-10-CM | POA: Diagnosis not present

## 2020-12-23 DIAGNOSIS — I110 Hypertensive heart disease with heart failure: Secondary | ICD-10-CM | POA: Diagnosis not present

## 2020-12-23 DIAGNOSIS — I509 Heart failure, unspecified: Secondary | ICD-10-CM | POA: Diagnosis not present

## 2020-12-23 DIAGNOSIS — Z950 Presence of cardiac pacemaker: Secondary | ICD-10-CM | POA: Diagnosis not present

## 2020-12-23 DIAGNOSIS — F17210 Nicotine dependence, cigarettes, uncomplicated: Secondary | ICD-10-CM | POA: Diagnosis not present

## 2021-01-08 ENCOUNTER — Other Ambulatory Visit: Payer: Self-pay

## 2021-01-08 ENCOUNTER — Ambulatory Visit: Payer: PPO | Admitting: Dermatology

## 2021-01-08 DIAGNOSIS — Z1283 Encounter for screening for malignant neoplasm of skin: Secondary | ICD-10-CM

## 2021-01-08 DIAGNOSIS — L820 Inflamed seborrheic keratosis: Secondary | ICD-10-CM | POA: Diagnosis not present

## 2021-01-08 DIAGNOSIS — D692 Other nonthrombocytopenic purpura: Secondary | ICD-10-CM

## 2021-01-08 DIAGNOSIS — L918 Other hypertrophic disorders of the skin: Secondary | ICD-10-CM | POA: Diagnosis not present

## 2021-01-08 DIAGNOSIS — L814 Other melanin hyperpigmentation: Secondary | ICD-10-CM

## 2021-01-08 DIAGNOSIS — L821 Other seborrheic keratosis: Secondary | ICD-10-CM | POA: Diagnosis not present

## 2021-01-08 DIAGNOSIS — L578 Other skin changes due to chronic exposure to nonionizing radiation: Secondary | ICD-10-CM | POA: Diagnosis not present

## 2021-01-08 DIAGNOSIS — L570 Actinic keratosis: Secondary | ICD-10-CM | POA: Diagnosis not present

## 2021-01-08 DIAGNOSIS — I781 Nevus, non-neoplastic: Secondary | ICD-10-CM | POA: Diagnosis not present

## 2021-01-08 NOTE — Progress Notes (Signed)
New Patient Visit  Subjective  Brandon Lopez is a 65 y.o. male who presents for the following: check spots (Face, chest, back, ~ 6-42m, look funny,). The patient presents for Upper Body Skin Exam (UBSE) for skin cancer screening and mole check.  The patient has spots, moles and lesions to be evaluated, some may be new or changing and the patient has concerns that these could be cancer.  New patient referral from [name], NP.  The following portions of the chart were reviewed this encounter and updated as appropriate:   Tobacco   Allergies   Meds   Problems   Med Hx   Surg Hx   Fam Hx      Review of Systems:  No other skin or systemic complaints except as noted in HPI or Assessment and Plan.  Objective  Well appearing patient in no apparent distress; mood and affect are within normal limits.  All skin waist up examined.  R chest Patch of dilated blood vessels  R chest x 1, L post shoulder x 1, R cheek x 1,Total = 3 (2) Stuck on waxy paps with erythema   scalp x 10 (10) Pink scaly macules   Head - Anterior (Face) Fleshy, skin-colored pedunculated papules.     Assessment & Plan   Lentigines - Scattered tan macules - Due to sun exposure - Benign-appearing, observe - Recommend daily broad spectrum sunscreen SPF 30+ to sun-exposed areas, reapply every 2 hours as needed. - Call for any changes  Seborrheic Keratoses - Stuck-on, waxy, tan-brown papules and/or plaques  - Benign-appearing - Discussed benign etiology and prognosis. - Observe - Call for any changes -Discussed LN2 $60 for first lesion and $15 for each additional treated on same day.  Purpura - Chronic; persistent and recurrent.  Treatable, but not curable. - Violaceous macules and patches - Benign - Related to trauma, age, sun damage and/or use of blood thinners, chronic use of topical and/or oral steroids - Observe - Can use OTC arnica containing moisturizer such as Dermend Bruise Formula  if desired - Call for worsening or other concerns   Actinic Damage - Chronic condition, secondary to cumulative UV/sun exposure - diffuse scaly erythematous macules with underlying dyspigmentation - Recommend daily broad spectrum sunscreen SPF 30+ to sun-exposed areas, reapply every 2 hours as needed.  - Staying in the shade or wearing long sleeves, sun glasses (UVA+UVB protection) and wide brim hats (4-inch brim around the entire circumference of the hat) are also recommended for sun protection.  - Call for new or changing lesions.  Skin cancer screening performed today.  Telangiectasia R chest Benign, observe.    Inflamed seborrheic keratosis (2) R chest x 1, L post shoulder x 1, R cheek x 1,Total = 3  Destruction of lesion - R chest x 1, L post shoulder x 1, R cheek x 1,Total = 3 Complexity: simple   Destruction method: cryotherapy   Informed consent: discussed and consent obtained   Timeout:  patient name, date of birth, surgical site, and procedure verified Lesion destroyed using liquid nitrogen: Yes   Region frozen until ice ball extended beyond lesion: Yes   Outcome: patient tolerated procedure well with no complications   Post-procedure details: wound care instructions given    AK (actinic keratosis) (10) scalp x 10  Destruction of lesion - scalp x 10 Complexity: simple   Destruction method: cryotherapy   Informed consent: discussed and consent obtained   Timeout:  patient name, date of  birth, surgical site, and procedure verified Lesion destroyed using liquid nitrogen: Yes   Region frozen until ice ball extended beyond lesion: Yes   Outcome: patient tolerated procedure well with no complications   Post-procedure details: wound care instructions given    Skin tag Head - Anterior (Face) Benign, observe.   Discussed snip excision $115.    Skin cancer screening  Return in about 3 months (around 04/08/2021) for AK, ISK f/u.  I, Othelia Pulling, RMA, am acting as  scribe for Sarina Ser, MD . Documentation: I have reviewed the above documentation for accuracy and completeness, and I agree with the above.  Sarina Ser, MD

## 2021-01-08 NOTE — Patient Instructions (Signed)

## 2021-01-13 ENCOUNTER — Encounter: Payer: Self-pay | Admitting: Dermatology

## 2021-01-14 ENCOUNTER — Telehealth: Payer: Self-pay | Admitting: Primary Care

## 2021-01-14 DIAGNOSIS — F17200 Nicotine dependence, unspecified, uncomplicated: Secondary | ICD-10-CM

## 2021-01-14 NOTE — Telephone Encounter (Signed)
Mr. Capili called in and stated that he saw [name] a few weeks ago due to CPE and he wanted to stop smoking and he stated that he had taken Wellbutrin a couple years ago and he stopped smoking for a year, and he has tried everything else and wanted to know about getting a script for the Wellbutrin.

## 2021-01-15 MED ORDER — BUPROPION HCL ER (SR) 150 MG PO TB12: ORAL_TABLET | ORAL | 0 refills | Status: DC

## 2021-01-15 NOTE — Addendum Note (Signed)
Addended by: Pleas Koch on: 01/15/2021 03:17 PM   Modules accepted: Orders

## 2021-01-15 NOTE — Telephone Encounter (Signed)
Yes, okay to try bupropion (Welbutrin) SR 150 mg.   Needs to stop smoking within 2 weeks of starting medication. Start 1 tablet by mouth once daily for 5 days, then one tablet twice daily thereafter.  Will send three month supply, have him update.

## 2021-01-15 NOTE — Telephone Encounter (Signed)
Left message to return call to our office.  Need to see if still using patches and how much he is smoking.

## 2021-01-15 NOTE — Telephone Encounter (Signed)
Called patient reviewed all information and repeated back to me. Will call if any questions.  Will call in few weeks with when his quit date was and how he is doing.

## 2021-01-15 NOTE — Telephone Encounter (Signed)
Pt would a cb.

## 2021-01-15 NOTE — Telephone Encounter (Signed)
Spoke to patient he has not been using the patches about a month ago they were not helping much. He is currently smoking 2PPD.

## 2021-01-27 ENCOUNTER — Ambulatory Visit (INDEPENDENT_AMBULATORY_CARE_PROVIDER_SITE_OTHER): Payer: PPO

## 2021-01-27 ENCOUNTER — Other Ambulatory Visit: Payer: Self-pay

## 2021-01-27 DIAGNOSIS — F17200 Nicotine dependence, unspecified, uncomplicated: Secondary | ICD-10-CM

## 2021-01-27 DIAGNOSIS — E785 Hyperlipidemia, unspecified: Secondary | ICD-10-CM

## 2021-01-27 DIAGNOSIS — I10 Essential (primary) hypertension: Secondary | ICD-10-CM

## 2021-01-27 NOTE — Patient Instructions (Signed)
Dear Brandon Lopez,  Below is a summary of the goals we discussed during our follow up appointment on January 27, 2021. Please contact me anytime with questions or concerns.   Visit Information  Patient Care Plan: CCM Pharmacy Care Plan     Problem Identified: CHL AMB "PATIENT-SPECIFIC PROBLEM"      Long-Range Goal: [place]   Start Date: 01/27/2021  This Visit's Progress: On track  Priority: High  Note:   Current Barriers:  Past due for follow up with specialist - cardiology and pulmonology  Pharmacist Clinical Goal(s):  Patient will contact provider office for questions/concerns as evidenced notation of same in electronic health record through collaboration with PharmD and provider.   Interventions: 1:1 collaboration with Pleas Koch, NP regarding development and update of comprehensive plan of care as evidenced by provider attestation and co-signature Inter-disciplinary care team collaboration (see longitudinal plan of care) Comprehensive medication review performed; medication list updated in electronic medical record  Hypertension (BP goal <140/90) -Controlled, per clinic readings  -Current treatment: Telmisartan-HCTZ 40-12.5 mg - 1 tablet daily - Appropriate, Effective, Safe, Accessible Carvedilol 3.125 mg - 1 tablet twice daily - Appropriate, Effective, Safe, Accessible -Medications previously tried: lisinopril (cough)  -Current home readings: none reported -Contributing factors: tobacco use, OSA -Complications: AFIB -Current dietary habits: no recent changes to diet/lifestyle -Current exercise habits: none formal -Denies hypotensive/hypertensive symptoms; Patient plans to follow up with cardiology regarding October ED visit for chest pain. He has not has chest pain since and thinks it may have been anxiety related. -Counseled to monitor BP at home periodically, document, and provide log at future appointments -Recommended to continue current  medication  Hyperlipidemia: (LDL goal < 100) -Controlled - LDL within goal -Current treatment: Atorvastatin 20 mg - 1 tablet daily - Appropriate, Effective, Safe, Accessible Zetia 10 mg - 1 tablet daily - Appropriate, Effective, Safe, Accessible -Medications previously tried: simvastatin  -Educated on Cholesterol goals;  -Recommended to continue current medication  COPD (Goal: control symptoms and prevent exacerbations) -Controlled, mild symptoms and no exacerbations in past year -Pt reports > 1 yr since last pulmonology, plans to schedule follow up -Last visit 06/19/19: Pulmonology - no ILD, positive for moderate emphysema with COPD, PFT normal maintained on Symbicort -Current treatment  Albuterol - 2 puffs every 6 hours PRN - Appropriate, Effective, Safe, Accessible Symbicort 160-4.5 mcg/act - 2 puffs twice daily - Query appropriate (LAMA/LABA preferred depending on patient history) -Medications previously tried: none reported   -Gold Grade: Gold 1 (FEV1>80%) -Current COPD Classification:  A (low sx, <2 exacerbations/yr) -MMRC/CAT score: none  -Pulmonary function testing: 06/19/19 FEV1 85% -Exacerbations requiring treatment in last 6 months: none  -Patient reports consistent use of maintenance inhaler -Frequency of rescue inhaler use: very rare -Denies SOB or recent albuterol use. -Counseled on Differences between maintenance and rescue inhalers -Pt denies any exacerbations this year. He is happy with current control. He is working on smoking cessation. Reviewed risks/benefits of inhaled corticosteroids in COPD. -Recommended to continue current medication; Follow up with pulmonology. Continue progress for smoking cessation.  Tobacco use (Goal Quit smoking) -Not ideally controlled - Pt started bupropion last Monday and plans to set quit within 2 weeks. We discussed tools for successful quit date. -Previous quit attempts: multiple -Reports Lung cancer screening scheduled for May  2023 -Current treatment  Bupropion 150 mg 12 hour (started on Monday 01/19/21) - 1 tablet twice daily  -Pt is motivated to quit smoking to improve his cough.  We discussed this could worsen before it improves. -Discussed building an online quit plan - [email or web address].[email or web address] -Recommended to continue current medication  Patient Goals/Self-Care Activities Patient will:  - contact CCM team with any health concerns  Follow Up Plan: Telephone follow up appointment with care management team member scheduled for:  -CCM visit 3 months -CCM team smoking cessation review 1 month -Follow up with cardiology and pulmonology as planned       Patient verbalizes understanding of instructions provided today and agrees to view in [place].   [name], PharmD Clinical Pharmacist Practitioner [facility] [phone]

## 2021-01-27 NOTE — Progress Notes (Signed)
Chronic Care Management Pharmacy Note  01/27/2021 Name:  Brandon Lopez MRN:  [id number] DOB:  07-19-1955  Summary: Pt tolerating bupropion well so far. Started 01/19/21. He plans to set quit date within 2 weeks from start. We discussed quit plan and online tools. Chronic conditions including HTN, HLD, COPD, reviewed. Pt is doing well. No adherence or cost concerns. Recommend annual follow up with pulmonology and follow up with cardiology regarding October ED visit for chest pain.  Recommendations/Changes made from today's visit: Follow up with specialist as recommended   Plan: CCM team tobacco use call 1 month CCM visit 3 months   Subjective: Brandon Lopez is an 66 y.o. year old male who is a primary patient of Brandon Koch, NP.  The CCM team was consulted for assistance with disease management and care coordination needs.    Engaged with patient by telephone for follow up visit in response to provider referral for pharmacy case management and/or care coordination services.   Consent to Services:  The patient was given information about Chronic Care Management services, agreed to services, and gave verbal consent prior to initiation of services.  Please see initial visit note for detailed documentation.   Patient Care Team: Brandon Koch, NP as PCP - General (Nurse Practitioner) [name], [facility] as Pharmacist (Pharmacist) Binnie Rail, DC as Referring Physician (Chiropractic Medicine) Owens Loffler, MD as Consulting Physician (Family Medicine)  Recent office visits: 12/11/20 - [name], PCP - Pt presented for preventative health care. No medication changes.   Recent consult visits: 01/08/21 [name], MD, Dermatology - Pt presented for skin cancer screening, return in 3 months.  11/25/20 [name], MD, Physical Medicine - Pt presented for lower back pain. Recommended "big three" exercises.   Hospital visits: 11/11/20 - ED visit -  Nonspecific chest pain, normal work up. Follow up with cardiology.    Objective:  Lab Results  Component Value Date   CREATININE 0.85 12/11/2020   BUN 13 12/11/2020   GFR 91.35 12/11/2020   GFRNONAA >60 11/11/2020   GFRAA >60 08/09/2017   NA 136 12/11/2020   K 4.1 12/11/2020   CALCIUM 9.3 12/11/2020   CO2 30 12/11/2020   GLUCOSE 88 12/11/2020    Lab Results  Component Value Date/Time   HGBA1C 5.6 01/11/2020 12:27 PM   HGBA1C 5.5 07/05/2014 08:34 AM   GFR 91.35 12/11/2020 11:45 AM   GFR 95.10 01/11/2020 12:27 PM    Lab Results  Component Value Date   CHOL 145 12/11/2020   HDL 46.20 12/11/2020   LDLCALC 72 12/11/2020   TRIG 136.0 12/11/2020   CHOLHDL 3 12/11/2020    Hepatic Function Latest Ref Rng & Units 12/11/2020 05/04/2020 01/11/2020  Total Protein 6.0 - 8.3 g/dL 6.6 6.2(L) 6.8  Albumin 3.5 - 5.2 g/dL 4.2 3.3(L) 4.2  AST 0 - 37 U/L $Remo'21 16 22  'AaXkC$ ALT 0 - 53 U/L $Remo'29 29 28  'OXepX$ Alk Phosphatase 39 - 117 U/L 82 58 74  Total Bilirubin 0.2 - 1.2 mg/dL 0.7 1.1 0.8    Lab Results  Component Value Date/Time   TSH 1.93 12/12/2018 10:30 AM   TSH 1.288 08/10/2017 08:36 AM   TSH 1.62 07/05/2014 08:34 AM    CBC Latest Ref Rng & Units 12/11/2020 11/11/2020 05/04/2020  WBC 4.0 - 10.5 K/uL 8.6 9.7 14.9(H)  Hemoglobin 13.0 - 17.0 g/dL 15.6 16.1 16.5  Hematocrit 39.0 - 52.0 % 46.0 45.0 47.2  Platelets 150.0 - 400.0 K/uL  225.0 266 202    No results found for: VD25OH  Clinical ASCVD: No  The 10-year ASCVD risk score (Arnett DK, et al., 2019) is: 18.8%   Values used to calculate the score:     Age: 24 years     Sex: Male     Is Non-Hispanic African American: No     Diabetic: No     Tobacco smoker: Yes     Systolic Blood Pressure: 696 mmHg     Is BP treated: Yes     HDL Cholesterol: 46.2 mg/dL     Total Cholesterol: 145 mg/dL    Depression screen [facility] 2/9 07/14/2020 01/11/2020 12/11/2018  Decreased Interest 0 0 0  Down, Depressed, Hopeless 0 0 0  PHQ - 2 Score 0 0 0  Altered  sleeping 0 - 0  Tired, decreased energy 0 - 0  Change in appetite 0 - 0  Feeling bad or failure about yourself  0 - 0  Trouble concentrating 0 - 0  Moving slowly or fidgety/restless 0 - 0  Suicidal thoughts 0 - 0  PHQ-9 Score 0 - 0  Difficult doing work/chores Not difficult at all - Not difficult at all     Social History   Tobacco Use  Smoking Status Heavy Smoker   Packs/day: 2.00   Years: 50.00   Pack years: 100.00   Types: Cigarettes   Start date: 1964  Smokeless Tobacco Never  Tobacco Comments   started smoking at age 66, 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   BP Readings from Last 3 Encounters:  12/11/20 136/68  11/25/20 (!) 139/97  11/11/20 130/84   Pulse Readings from Last 3 Encounters:  12/11/20 72  11/25/20 77  11/11/20 72   Wt Readings from Last 3 Encounters:  12/11/20 187 lb (84.8 kg)  11/11/20 180 lb (81.6 kg)  07/03/20 186 lb (84.4 kg)   BMI Readings from Last 3 Encounters:  12/11/20 31.12 kg/m  11/11/20 29.95 kg/m  07/03/20 30.95 kg/m    Assessment/Interventions: Review of patient past medical history, allergies, medications, health status, including review of consultants reports, laboratory and other test data, was performed as part of comprehensive evaluation and provision of chronic care management services.   SDOH:  (Social Determinants of Health) assessments and interventions performed: Yes SDOH Interventions    Flowsheet Row Most Recent Value  SDOH Interventions   Financial Strain Interventions Intervention Not Indicated      SDOH Screenings   Alcohol Screen: Medium Risk   Last Alcohol Screening Score (AUDIT): 13  Depression (PHQ2-9): Low Risk    PHQ-2 Score: 0  Financial Resource Strain: Low Risk    Difficulty of Paying Living Expenses: Not very hard  Food Insecurity: No Food Insecurity   Worried About Charity fundraiser in the Last Year: Never true   Ran Out of Food in the Last Year: Never true  Housing: Low Risk    Last Housing  Risk Score: 0  Physical Activity: Inactive   Days of Exercise per Week: 0 days   Minutes of Exercise per Session: 0 min  Social Connections: Not on file  Stress: Stress Concern Present   Feeling of Stress : Very much  Tobacco Use: High Risk   Smoking Tobacco Use: Heavy Smoker   Smokeless Tobacco Use: Never   Passive Exposure: Not on file  Transportation Needs: No Transportation Needs   Lack of Transportation (Medical): No   Lack of Transportation (Non-Medical): No    CCM Care Plan  Allergies  Allergen Reactions   Simvastatin Diarrhea   Lisinopril Cough    Medications Reviewed Today     Reviewed by [name], [facility] (Pharmacist) on 01/27/21 at [place] List Status: <None>   Medication Order Taking? Sig Documenting Provider Last Dose Status Informant  albuterol (VENTOLIN HFA) 108 (90 Base) MCG/ACT inhaler [id number] No INHALE 2 PUFFS BY MOUTH EVERY 6 HOURS AS NEEDED FOR WHEEZING AND FOR SHORTNESS OF Linard Millers, MD Taking Active   aspirin EC 81 MG tablet [id number] No Take 81 mg by mouth daily. Swallow whole. [provider] Taking Active   atorvastatin (LIPITOR) 20 MG tablet [id number] No Take 1 tablet (20 mg total) by mouth daily. For cholesterol. Office visit required for further refills. Brandon Koch, NP Taking Active   buPROPion [facility] SR) 150 MG 12 hr tablet [id number]  Take 1 tablet by mouth once daily for 5 days, then take one tablet twice daily thereafter. Brandon Koch, NP  Active   carvedilol (COREG) 3.125 MG tablet [id number] No Take 1 tablet by mouth twice daily Deboraha Sprang, MD Taking Active   ezetimibe (ZETIA) 10 MG tablet [id number] No Take 1 tablet by mouth once daily Gollan, Kathlene November, MD Taking Active   loratadine (CLARITIN) 10 MG tablet [id number] No Take 10 mg by mouth daily as needed for allergies.  [provider] Taking Active Self  meloxicam (MOBIC) 15 MG tablet [id number] No Take 0.5-1 tablets (7.5-15 mg total) by  mouth daily as needed for pain. Hilts, Legrand Como, MD Taking Active   SYMBICORT 160-4.5 MCG/ACT inhaler [id number] No Inhale 2 puffs into the lungs 2 (two) times daily. Brandon Koch, NP Taking Active   telmisartan-hydrochlorothiazide (MICARDIS HCT) 40-12.5 MG tablet [id number] No Take 1 tablet by mouth once daily for blood pressure Brandon Koch, NP Taking Active             Patient Active Problem List   Diagnosis Date Noted   Skin lesions 07/03/2020   Chronic back pain 08/17/2019   Chronic shoulder pain 12/12/2018   Rectal bleeding 09/19/2018   Paroxysmal atrial fibrillation ([place]) 10/31/2017   Syncope 08/10/2017   Osteoarthritis 03/17/2017   Edema 07/21/2016   Cigarette smoker 11/17/2015   Diverticulosis 12/16/2014   Chronic obstructive pulmonary disease ([place]) 11/26/2014   Cervical pain (neck) 10/14/2014   Hyperlipidemia 07/11/2014   Preventative health care 07/11/2014   Pacemaker 06/03/2014   Anxiety and depression 06/03/2014   Alcohol abuse 06/03/2014   OSA (obstructive sleep apnea) 06/03/2014   Essential hypertension 06/03/2014   Tobacco dependence 06/03/2014    Immunization History  Administered Date(s) Administered   Fluad Quad(high Dose 65+) 12/11/2020   Influenza,inj,Quad PF,6+ Mos 11/15/2014, 11/05/2015, 12/23/2016, 12/06/2017, 12/12/2018, 01/11/2020   PNEUMOCOCCAL CONJUGATE-20 12/11/2020   Pneumococcal Polysaccharide-23 11/04/2014   Td 10/26/2011   Zoster, Live 11/18/2015    Conditions to be addressed/monitored:  Hypertension, Hyperlipidemia, COPD, and Tobacco use  Care Plan : [place]  Updates made by [name], [place] since 01/27/2021 12:00 AM     Problem: CHL AMB "PATIENT-SPECIFIC PROBLEM"      Long-Range Goal: [place]   Start Date: 01/27/2021  This Visit's Progress: On track  Priority: High  Note:   Current Barriers:  Past due for follow up with specialist - cardiology and pulmonology  Pharmacist Clinical  Goal(s):  Patient will contact provider office for questions/concerns as evidenced notation of same in electronic health record through  collaboration with PharmD and provider.   Interventions: 1:1 collaboration with Brandon Koch, NP regarding development and update of comprehensive plan of care as evidenced by provider attestation and co-signature Inter-disciplinary care team collaboration (see longitudinal plan of care) Comprehensive medication review performed; medication list updated in electronic medical record  Hypertension (BP goal <140/90) -Controlled, per clinic readings  -Current treatment: Telmisartan-HCTZ 40-12.5 mg - 1 tablet daily - Appropriate, Effective, Safe, Accessible Carvedilol 3.125 mg - 1 tablet twice daily - Appropriate, Effective, Safe, Accessible -Medications previously tried: lisinopril (cough)  -Current home readings: none reported -Contributing factors: tobacco use, OSA -Complications: AFIB -Current dietary habits: no recent changes to diet/lifestyle -Current exercise habits: none formal -Denies hypotensive/hypertensive symptoms; Patient plans to follow up with cardiology regarding October ED visit for chest pain. He has not has chest pain since and thinks it may have been anxiety related. -Counseled to monitor BP at home periodically, document, and provide log at future appointments -Recommended to continue current medication  Hyperlipidemia: (LDL goal < 100) -Controlled - LDL within goal -Current treatment: Atorvastatin 20 mg - 1 tablet daily - Appropriate, Effective, Safe, Accessible Zetia 10 mg - 1 tablet daily - Appropriate, Effective, Safe, Accessible -Medications previously tried: simvastatin  -Educated on Cholesterol goals;  -Recommended to continue current medication  COPD (Goal: control symptoms and prevent exacerbations) -Controlled, mild symptoms and no exacerbations in past year -Pt reports > 1 yr since last pulmonology, plans to  schedule follow up -Last visit 06/19/19: Pulmonology - no ILD, positive for moderate emphysema with COPD, PFT normal maintained on Symbicort -Current treatment  Albuterol - 2 puffs every 6 hours PRN - Appropriate, Effective, Safe, Accessible Symbicort 160-4.5 mcg/act - 2 puffs twice daily - Query appropriate (LAMA/LABA preferred depending on patient history) -Medications previously tried: none reported   -Gold Grade: Gold 1 (FEV1>80%) -Current COPD Classification:  A (low sx, <2 exacerbations/yr) -MMRC/CAT score: none  -Pulmonary function testing: 06/19/19 FEV1 85% -Exacerbations requiring treatment in last 6 months: none  -Patient reports consistent use of maintenance inhaler -Frequency of rescue inhaler use: very rare -Denies SOB or recent albuterol use. -Counseled on Differences between maintenance and rescue inhalers -Pt denies any exacerbations this year. He is happy with current control. He is working on smoking cessation. Reviewed risks/benefits of inhaled corticosteroids in COPD. -Recommended to continue current medication; Follow up with pulmonology. Continue progress for smoking cessation.  Tobacco use (Goal Quit smoking) -Not ideally controlled - Pt started bupropion last Monday and plans to set quit within 2 weeks. We discussed tools for successful quit date. -Previous quit attempts: multiple -Reports Lung cancer screening scheduled for May 2023 -Current treatment  Bupropion 150 mg 12 hour (started on Monday 01/19/21) - 1 tablet twice daily  -Pt is motivated to quit smoking to improve his cough. We discussed this could worsen before it improves. -Discussed building an online quit plan - [email or web address] -Recommended to continue current medication  Patient Goals/Self-Care Activities Patient will:  - contact CCM team with any health concerns  Follow Up Plan: Telephone follow up appointment with care management team member scheduled for:  -CCM visit 3  months -CCM team smoking cessation review 1 month -Follow up with cardiology and pulmonology as planned      Medication Assistance: None required.  Patient affirms current coverage meets needs.  Compliance/Adherence/Medication fill history: Care Gaps: None   Star-Rating Drugs: Drug Name    Last fill date Atorvastatin 20 mg     12/30/20 90 DS Telmisartan-HCTZ 40-12.5  mg   12/09/20 90 DS  Patient's preferred pharmacy is:  [facility] [address], [place] [address] [place] [address] Phone: [phone] Fax: [phone]  Uses pill box? No - he has a good system in place (organized storage, takes all before breakfast)  - he has two that are later in day (BID  - carvedilol, Wellbutrin) Pt endorses 100% compliance - never misses doses  Care Plan and Follow Up Patient Decision:  Patient agrees to Care Plan and Follow-up.  [name], PharmD Clinical Pharmacist [facility] at [facility] [phone]

## 2021-01-29 DIAGNOSIS — Z6830 Body mass index (BMI) 30.0-30.9, adult: Secondary | ICD-10-CM | POA: Diagnosis not present

## 2021-01-29 DIAGNOSIS — Z7951 Long term (current) use of inhaled steroids: Secondary | ICD-10-CM | POA: Diagnosis not present

## 2021-01-29 DIAGNOSIS — Z950 Presence of cardiac pacemaker: Secondary | ICD-10-CM | POA: Diagnosis not present

## 2021-01-29 DIAGNOSIS — I5020 Unspecified systolic (congestive) heart failure: Secondary | ICD-10-CM | POA: Diagnosis not present

## 2021-01-29 DIAGNOSIS — I480 Paroxysmal atrial fibrillation: Secondary | ICD-10-CM | POA: Diagnosis not present

## 2021-01-29 DIAGNOSIS — F17210 Nicotine dependence, cigarettes, uncomplicated: Secondary | ICD-10-CM | POA: Diagnosis not present

## 2021-01-29 DIAGNOSIS — J449 Chronic obstructive pulmonary disease, unspecified: Secondary | ICD-10-CM | POA: Diagnosis not present

## 2021-01-29 DIAGNOSIS — I700 Atherosclerosis of aorta: Secondary | ICD-10-CM | POA: Diagnosis not present

## 2021-01-29 DIAGNOSIS — I110 Hypertensive heart disease with heart failure: Secondary | ICD-10-CM | POA: Diagnosis not present

## 2021-01-29 DIAGNOSIS — G4733 Obstructive sleep apnea (adult) (pediatric): Secondary | ICD-10-CM | POA: Diagnosis not present

## 2021-02-16 ENCOUNTER — Other Ambulatory Visit: Payer: Self-pay | Admitting: Cardiovascular Disease

## 2021-02-18 ENCOUNTER — Other Ambulatory Visit: Payer: Self-pay | Admitting: Cardiovascular Disease

## 2021-02-20 ENCOUNTER — Other Ambulatory Visit: Payer: Self-pay | Admitting: Cardiovascular Disease

## 2021-02-20 ENCOUNTER — Telehealth: Payer: Self-pay | Admitting: Cardiovascular Disease

## 2021-02-20 NOTE — Telephone Encounter (Signed)
°*  STAT* If patient is at the pharmacy, call can be transferred to refill team.   1. Which medications need to be refilled? (please list name of each medication and dose if known)  ezetimibe (ZETIA) 10 MG tablet  2. Which pharmacy/location (including street and city if local pharmacy) is medication to be sent to? [place], [place]  3. Do they need a 30 day or 90 day supply? 90 with refills

## 2021-02-20 NOTE — Telephone Encounter (Signed)
Previous note forwarded to [place] to be addressed.

## 2021-02-20 NOTE — Telephone Encounter (Signed)
Please contact pt for future appointment. Pt due for 12 month fu with Dr. Rockey Situ Pt needing refills. Pt has future appointment with [name]; I'm not sure if pt is switching his care to [facility] cardiology or continuing to see Dr. Rockey Situ.

## 2021-02-20 NOTE — Telephone Encounter (Signed)
Please contact pt for future appointment. Pt due for 12 month fu with Dr. Rockey Situ Pt needing refills. Pt has future appointment with [name]; I'm not sure if pt is switching his care to [facility] cardiology or continuing to see Dr. Rockey Situ.

## 2021-02-23 NOTE — Telephone Encounter (Signed)
Attempted to schedule.  LMOV to call office.  ° °

## 2021-02-24 DIAGNOSIS — E785 Hyperlipidemia, unspecified: Secondary | ICD-10-CM

## 2021-02-24 DIAGNOSIS — I10 Essential (primary) hypertension: Secondary | ICD-10-CM

## 2021-03-04 ENCOUNTER — Other Ambulatory Visit: Payer: Self-pay | Admitting: Internal Medicine

## 2021-03-10 ENCOUNTER — Telehealth: Payer: Self-pay

## 2021-03-10 ENCOUNTER — Ambulatory Visit (INDEPENDENT_AMBULATORY_CARE_PROVIDER_SITE_OTHER): Payer: PPO

## 2021-03-10 DIAGNOSIS — I495 Sick sinus syndrome: Secondary | ICD-10-CM

## 2021-03-10 NOTE — Telephone Encounter (Signed)
Called patient and states he has moved near [place] and wanted to make sure this would not affect his device transmissions. Advised patient his monitor will be fine just make sure it is plugged in beside where he sleeps, within 4-6 feet. Also, offered [employer] apts, states he would like to keep [place] at this time but if he changes his mind, he will call. Appreciative for f/u call.

## 2021-03-10 NOTE — Telephone Encounter (Signed)
Patient calling stating he recently moved to a different city and would like to know if that is going to affect his remote transmissions.

## 2021-03-11 LAB — CUP PACEART REMOTE DEVICE CHECK
Date Time Interrogation Session: 20230215075113
Implantable Lead Implant Date: 20140801
Implantable Lead Implant Date: 20140801
Implantable Lead Location: 753859
Implantable Lead Location: 753860
Implantable Lead Model: 362
Implantable Lead Serial Number: 29356730
Implantable Lead Serial Number: 29392661
Implantable Pulse Generator Implant Date: 20140801
Pulse Gen Serial Number: 68053398

## 2021-03-13 NOTE — Progress Notes (Signed)
Remote pacemaker transmission.   

## 2021-03-23 ENCOUNTER — Other Ambulatory Visit: Payer: Self-pay | Admitting: Primary Care

## 2021-03-23 DIAGNOSIS — I10 Essential (primary) hypertension: Secondary | ICD-10-CM

## 2021-03-27 ENCOUNTER — Telehealth: Payer: Self-pay | Admitting: Primary Care

## 2021-03-27 DIAGNOSIS — E785 Hyperlipidemia, unspecified: Secondary | ICD-10-CM

## 2021-03-27 NOTE — Telephone Encounter (Signed)
?  Encourage patient to contact the pharmacy for refills or they can request refills through [facility] ? ?LAST APPOINTMENT DATE:  Please schedule appointment if longer than 1 year ? ?NEXT APPOINTMENT DATE: ? ?MEDICATION:atorvastatin (LIPITOR) 20 MG tablet ? ?Is the patient out of medication?  ? ?BWGYKZLD:[id number] [address], [place], [place] [address]( pt would like all his medication going here) ? ?Let patient know to contact pharmacy at the end of the day to make sure medication is ready. ? ?Please notify patient to allow 48-72 hours to process ? ?CLINICAL FILLS OUT ALL BELOW:  ? ?LAST REFILL: ? ?QTY: ? ?REFILL DATE: ? ? ? ?OTHER COMMENTS:  ? ? ?Okay for refill? ? ?Please advise ? ? ?  ?

## 2021-03-29 MED ORDER — ATORVASTATIN CALCIUM 20 MG PO TABS: 20.0000 mg | ORAL_TABLET | Freq: Every day | ORAL | 2 refills | Status: DC

## 2021-03-29 NOTE — Telephone Encounter (Signed)
Refills sent to pharmacy. 

## 2021-04-09 ENCOUNTER — Ambulatory Visit (INDEPENDENT_AMBULATORY_CARE_PROVIDER_SITE_OTHER): Payer: PPO | Admitting: Primary Care

## 2021-04-09 ENCOUNTER — Other Ambulatory Visit: Payer: Self-pay

## 2021-04-09 ENCOUNTER — Encounter: Payer: Self-pay | Admitting: Primary Care

## 2021-04-09 VITALS — BP 134/62 | HR 72 | Temp 98.1°F | Ht 65.0 in | Wt 192.0 lb

## 2021-04-09 DIAGNOSIS — N529 Male erectile dysfunction, unspecified: Secondary | ICD-10-CM | POA: Diagnosis not present

## 2021-04-09 DIAGNOSIS — R351 Nocturia: Secondary | ICD-10-CM | POA: Diagnosis not present

## 2021-04-09 MED ORDER — TADALAFIL 5 MG PO TABS: 5.0000 mg | ORAL_TABLET | Freq: Every day | ORAL | 0 refills | Status: DC

## 2021-04-09 NOTE — Progress Notes (Signed)
? ?Subjective:  ? ? Patient ID: Brandon Lopez, male    DOB: 1955/11/29, 66 y.o.   MRN: [id number] ? ?HPI ? ?Brandon Lopez is a very pleasant 66 y.o. male with a medical history of hypertension, paroxysmal atrial fibrillation, OSA, COPD, rectal bleeding, pacemaker in place, MDD, tobacco abuse, osteoarthritis who presents today to discuss erectile dysfunction. ? ?He denies a history of erectile dysfunction, but was prescribed testosterone and Cialis due to low testosterone levels over 10 years ago. He took this regimen for about 6 months before discontinuation. He didn't feel as though he ever needed treatment.  ? ?Symptoms include obtaining and maintaining an erection, mostly obtaining an erection. This began 2-3 months ago. He is looking to start dating again, would like treatment for his ED. He does notice nocturia during the night, is up three times nightly, this began about 1 year ago. He has never mentioned this until now. ? ?He denies chest pain during ejaculation, weak urinary stream, hematuria, excessive caffeine use, liquids before bed. He never uses nitroglycerin. He quit smoking 02/01/21. He is drinking beer, does not drink liquor.  ? ?BP Readings from Last 3 Encounters:  ?04/09/21 134/62  ?12/11/20 136/68  ?11/25/20 (!) 139/97  ? ? ? ? ?Review of Systems  ?Respiratory:  Negative for shortness of breath.   ?Cardiovascular:  Negative for chest pain.  ?Genitourinary:  Positive for frequency. Negative for difficulty urinating, dysuria and urgency.  ?     Erectile dysfunction   ? ?   ? ? ?Past Medical History:  ?Diagnosis Date  ? Arthritis   ? Chronic systolic CHF (congestive heart failure) ([place])   ? a. echo 6/16: EF 55-60%, Gr1DD, LA nl, PASP nl; b. echo 7/19: EF 40-45%, unable to exclude RWMA, Gr1DD, LA nl, RVSF nl, PASP nl  ? COPD (chronic obstructive pulmonary disease) ([facility])   ? Depression   ? Diverticulosis   ? Found during colonoscopy  ? Hypertension   ? Kidney stones   ? PAF (paroxysmal  atrial fibrillation) ([place])   ? a. short episodes previously noted on PPM interrogation; b. started on Xarelto 07/2017 given further episodes of Afib noted during interrogation; c. CHADS2VASc at least 3 (CHF, HTN, vascualr disease with aortic calcifications noted on CT)  ? 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 Presence of permanent cardiac pacemaker   ? a. Biotronik; Dayna Barker DR-T [id number]; placed 08/2012  ? Sleep apnea   ? Symptomatic bradycardia   ? a. s/p Biotronik; Dayna Barker DR-T [id number]; placed 08/2012 at [facility]  ? ? ?Social History  ? ?Socioeconomic History  ? Marital status: Single  ?  Spouse name: Not on file  ? Number of children: Not on file  ? Years of education: Not on file  ? Highest education level: Not on file  ?Occupational History  ? Not on file  ?Tobacco Use  ? Smoking status: Heavy Smoker  ?  Packs/day: 2.00  ?  Years: 50.00  ?  Pack years: 100.00  ?  Types: Cigarettes  ?  Start date: 1964  ? Smokeless tobacco: Never  ? Tobacco comments:  ?  started smoking at age 46, 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  ?Vaping Use  ? Vaping Use: Never used  ?Substance and Sexual Activity  ? Alcohol use: Yes  ?  Alcohol/week: 8.0 - 14.0 standard drinks  ?  Types: 8 - 14 Cans of beer per week  ? Drug use: Yes  ?  Types: Marijuana  ? Sexual activity: Not  on file  ?Other Topics Concern  ? Not on file  ?Social History Narrative  ? Not on file  ? ?Social Determinants of Health  ? ?Financial Resource Strain: Low Risk   ? Difficulty of Paying Living Expenses: Not very hard  ?Food Insecurity: No Food Insecurity  ? Worried About Charity fundraiser in the Last Year: Never true  ? Ran Out of Food in the Last Year: Never true  ?Transportation Needs: No Transportation Needs  ? Lack of Transportation (Medical): No  ? Lack of Transportation (Non-Medical): No  ?Physical Activity: Inactive  ? Days of Exercise per Week: 0 days  ? Minutes of Exercise per Session: 0 min  ?Stress: Stress Concern Present  ? Feeling of Stress : Very much  ?Social Connections: Not on  file  ?Intimate Partner Violence: Not At Risk  ? Fear of Current or Ex-Partner: No  ? Emotionally Abused: No  ? Physically Abused: No  ? Sexually Abused: No  ? ? ?Past Surgical History:  ?Procedure Laterality Date  ? APPENDECTOMY  age 84  ? CARDIAC CATHETERIZATION    ? 7 years ago  ? COLONOSCOPY N/A 05/27/2014  ? Procedure: COLONOSCOPY;  Surgeon: Hulen Luster, MD;  Location: [facility] ENDOSCOPY;  Service: Gastroenterology;  Laterality: N/A;  ? INSERT / REPLACE / REMOVE PACEMAKER    ? 3 years ago  ? ? ?Family History  ?Problem Relation Age of Onset  ? Arthritis Mother   ? Heart disease Father   ? Hyperlipidemia Father   ? Hypertension Father   ? Cancer Paternal Grandfather   ? ? ?Allergies  ?Allergen Reactions  ? Simvastatin Diarrhea  ? Lisinopril Cough  ? ? ?Current Outpatient Medications on File Prior to Visit  ?Medication Sig Dispense Refill  ? albuterol (VENTOLIN HFA) 108 (90 Base) MCG/ACT inhaler INHALE 2 PUFFS BY MOUTH EVERY 6 HOURS AS NEEDED FOR WHEEZING AND FOR SHORTNESS OF BREATH 18 g 0  ? aspirin EC 81 MG tablet Take 81 mg by mouth daily. Swallow whole.    ? atorvastatin (LIPITOR) 20 MG tablet Take 1 tablet (20 mg total) by mouth daily. For cholesterol. 90 tablet 2  ? buPROPion (WELLBUTRIN SR) 150 MG 12 hr tablet Take 1 tablet by mouth once daily for 5 days, then take one tablet twice daily thereafter. 180 tablet 0  ? carvedilol (COREG) 3.125 MG tablet Take 1 tablet by mouth twice daily 180 tablet 0  ? ezetimibe (ZETIA) 10 MG tablet Take 1 tablet by mouth once daily 90 tablet 0  ? loratadine (CLARITIN) 10 MG tablet Take 10 mg by mouth daily as needed for allergies.     ? SYMBICORT 160-4.5 MCG/ACT inhaler Inhale 2 puffs into the lungs 2 (two) times daily. 33 g 0  ? telmisartan-hydrochlorothiazide (MICARDIS HCT) 40-12.5 MG tablet Take 1 tablet by mouth once daily for blood pressure 90 tablet 2  ? meloxicam (MOBIC) 15 MG tablet Take 0.5-1 tablets (7.5-15 mg total) by mouth daily as needed for pain. (Patient not taking:  Reported on 04/09/2021) 30 tablet 6  ? ?No current facility-administered medications on file prior to visit.  ? ? ?BP 134/62   Pulse 72   Temp 98.1 ?F (36.7 ?C) (Oral)   Ht '5\' 5"'$  (1.651 m)   Wt 192 lb (87.1 kg)   SpO2 96%   BMI 31.95 kg/m?  ?Objective:  ? Physical Exam ?Cardiovascular:  ?   Rate and Rhythm: Normal rate and regular rhythm.  ?Pulmonary:  ?  Effort: Pulmonary effort is normal.  ?   Breath sounds: Normal breath sounds. No wheezing or rales.  ?Musculoskeletal:  ?   Cervical back: Neck supple.  ?Skin: ?   General: Skin is warm and dry.  ?Neurological:  ?   Mental Status: He is alert and oriented to person, place, and time.  ? ? ? ? ? ?   ?Assessment & Plan:  ? ? ? ? ?This visit occurred during the SARS-CoV-2 public health emergency.  Safety protocols were in place, including screening questions prior to the visit, additional usage of staff PPE, and extensive cleaning of exam room while observing appropriate contact time as indicated for disinfecting solutions.  ?

## 2021-04-09 NOTE — Assessment & Plan Note (Signed)
Treat with Cialis 5 mg daily which should also help with nocturia. ? ?PSA reviewed from November 2022. ? ?Discussed warning regarding nitroglycerin and Cialis.  Generally he never takes nitroglycerin. ? ?He will update if his insurance does not cover Cialis, consider sildenafil in this case ?

## 2021-04-09 NOTE — Patient Instructions (Signed)
Start tadalafil (Cialis) 5 mg once daily for frequent urination during the night and for erectile dysfunction. ? ?Please notify me if your insurance will not cover this medication. ? ?Try to limit liquids before bedtime, avoid caffeine after 12 PM. ? ?It was a pleasure to see you today! ? ? ?

## 2021-04-09 NOTE — Assessment & Plan Note (Signed)
Suspect BPH. ? ?PSA reviewed from November 2022. ?No acute symptoms to suggest prostatitis. ? ?Given his history of ED, will treat with Cialis 5 mg daily asked to treat both nocturia and ED. ? ?Update if his insurance does not cover Cialis. ?

## 2021-04-15 DIAGNOSIS — R3912 Poor urinary stream: Secondary | ICD-10-CM

## 2021-04-15 DIAGNOSIS — R351 Nocturia: Secondary | ICD-10-CM

## 2021-04-16 ENCOUNTER — Other Ambulatory Visit: Payer: Self-pay

## 2021-04-16 ENCOUNTER — Ambulatory Visit: Payer: PPO | Admitting: Dermatology

## 2021-04-16 ENCOUNTER — Encounter: Payer: Self-pay | Admitting: Dermatology

## 2021-04-16 DIAGNOSIS — Z86018 Personal history of other benign neoplasm: Secondary | ICD-10-CM | POA: Diagnosis not present

## 2021-04-16 DIAGNOSIS — L814 Other melanin hyperpigmentation: Secondary | ICD-10-CM | POA: Diagnosis not present

## 2021-04-16 DIAGNOSIS — L918 Other hypertrophic disorders of the skin: Secondary | ICD-10-CM

## 2021-04-16 DIAGNOSIS — D1800 Hemangioma unspecified site: Secondary | ICD-10-CM

## 2021-04-16 DIAGNOSIS — Z1283 Encounter for screening for malignant neoplasm of skin: Secondary | ICD-10-CM

## 2021-04-16 DIAGNOSIS — L821 Other seborrheic keratosis: Secondary | ICD-10-CM | POA: Diagnosis not present

## 2021-04-16 DIAGNOSIS — L578 Other skin changes due to chronic exposure to nonionizing radiation: Secondary | ICD-10-CM

## 2021-04-16 DIAGNOSIS — D692 Other nonthrombocytopenic purpura: Secondary | ICD-10-CM

## 2021-04-16 DIAGNOSIS — Z872 Personal history of diseases of the skin and subcutaneous tissue: Secondary | ICD-10-CM | POA: Diagnosis not present

## 2021-04-16 NOTE — Progress Notes (Signed)
? ?  Follow-Up Visit ?  ?Subjective  ?Brandon Lopez is a 65 y.o. male who presents for the following: Actinic Keratosis (Scalp, 89mf/u) and ISK f/u (R cheek, R chest, L post shoulder, 328m/u, LN2 at last visit). ?The patient has spots, moles and lesions to be evaluated, some may be new or changing and the patient has concerns that these could be cancer. ? ?The following portions of the chart were reviewed this encounter and updated as appropriate:  ? Tobacco  Allergies  Meds  Problems  Med Hx  Surg Hx  Fam Hx   ?  ?Review of Systems:  No other skin or systemic complaints except as noted in HPI or Assessment and Plan. ? ?Objective  ?Well appearing patient in no apparent distress; mood and affect are within normal limits. ? ?All skin waist up examined. ? ?Scalp ?Scalp clear today ? ? ?Assessment & Plan  ? ?Hemangiomas ?- Red papules ?- Discussed benign nature ?- Observe ?- Call for any changes ? ?Acrochordons (Skin Tags) ?- Fleshy, skin-colored pedunculated papules ?- Benign appearing.  ?- Observe. ?- If desired, they can be removed with an in office procedure that is not covered by insurance. ?- Please call the clinic if you notice any new or changing lesions.  ? ?Seborrheic Keratoses ?- Stuck-on, waxy, tan-brown papules and/or plaques  ?- Benign-appearing ?- Discussed benign etiology and prognosis. ?- Observe ?- Call for any changes ? ?History of Inflamed seborrheic keratosis ?- Resolved ? ?History of actinic keratoses ?Scalp ?Clear, observe ? ?Purpura - Chronic; persistent and recurrent.  Treatable, but not curable. ?- Violaceous macules and patches ?- Benign ?- Related to trauma, age, sun damage and/or use of blood thinners, chronic use of topical and/or oral steroids ?- Observe ?- Can use OTC arnica containing moisturizer such as Dermend Bruise Formula if desired ?- Call for worsening or other concerns ? ?Lentigines ?- Scattered tan macules ?- Due to sun exposure ?- Benign-appering, observe ?-  Recommend daily broad spectrum sunscreen SPF 30+ to sun-exposed areas, reapply every 2 hours as needed. ?- Call for any changes ? ?Actinic Damage ?- chronic, secondary to cumulative UV radiation exposure/sun exposure over time ?- diffuse scaly erythematous macules with underlying dyspigmentation ?- Recommend daily broad spectrum sunscreen SPF 30+ to sun-exposed areas, reapply every 2 hours as needed.  ?- Recommend staying in the shade or wearing long sleeves, sun glasses (UVA+UVB protection) and wide brim hats (4-inch brim around the entire circumference of the hat). ?- Call for new or changing lesions.  ? ?Return in about 1 year (around 04/17/2022) for UBSE, Hx of AKs. ? ?I, SoOthelia PullingRMA, am acting as scribe for DaSarina SerMD . ?Documentation: I have reviewed the above documentation for accuracy and completeness, and I agree with the above. ? ?DaSarina SerMD ? ?

## 2021-04-16 NOTE — Patient Instructions (Signed)

## 2021-04-19 ENCOUNTER — Encounter: Payer: Self-pay | Admitting: Dermatology

## 2021-04-20 ENCOUNTER — Telehealth: Payer: Self-pay

## 2021-04-20 ENCOUNTER — Telehealth: Payer: Self-pay | Admitting: Primary Care

## 2021-04-20 NOTE — Telephone Encounter (Signed)
Pt called stating that he had a Cancer Screening. Pt states that he is not sure of where to go and pt also is asking if he could get in earlier than his original appt. Please advise. ?

## 2021-04-20 NOTE — Chronic Care Management (AMB) (Signed)
? ? ?  Chronic Care Management ?Pharmacy Assistant  ? ?Name: Brandon Lopez  MRN: [id number] DOB: May 19, 1955 ? ? ?Reason for Encounter:Tobacco  Disease State ?  ? ?Recent office visits:  ?04/21/21-Family Medicine-James Cable,NP- Fatigue/SOB-EKG,Chest xray,encouraged follow up with cardiology,pulmonology,no medication changes ? ?Recent consult visits:  ?04/21/21-Cardiology-Timothy Gollan,MD-follow up yearly.- quit smoking 02/01/21.has pacemaker,no medication changes ? ?Hospital visits:  ?None in previous 6 months ? ?Medications: ?Outpatient Encounter Medications as of 04/20/2021  ?Medication Sig  ? albuterol (VENTOLIN HFA) 108 (90 Base) MCG/ACT inhaler INHALE 2 PUFFS BY MOUTH EVERY 6 HOURS AS NEEDED FOR WHEEZING AND FOR SHORTNESS OF BREATH  ? aspirin EC 81 MG tablet Take 81 mg by mouth daily. Swallow whole.  ? atorvastatin (LIPITOR) 20 MG tablet Take 1 tablet (20 mg total) by mouth daily. For cholesterol.  ? buPROPion (WELLBUTRIN SR) 150 MG 12 hr tablet Take 1 tablet by mouth once daily for 5 days, then take one tablet twice daily thereafter.  ? carvedilol (COREG) 3.125 MG tablet Take 1 tablet by mouth twice daily  ? ezetimibe (ZETIA) 10 MG tablet Take 1 tablet by mouth once daily  ? loratadine (CLARITIN) 10 MG tablet Take 10 mg by mouth daily as needed for allergies.   ? meloxicam (MOBIC) 15 MG tablet Take 0.5-1 tablets (7.5-15 mg total) by mouth daily as needed for pain. (Patient not taking: Reported on 04/09/2021)  ? SYMBICORT 160-4.5 MCG/ACT inhaler Inhale 2 puffs into the lungs 2 (two) times daily.  ? tadalafil (CIALIS) 5 MG tablet Take 1 tablet (5 mg total) by mouth daily. For nocturia and ED.  ? telmisartan-hydrochlorothiazide (MICARDIS HCT) 40-12.5 MG tablet Take 1 tablet by mouth once daily for blood pressure  ? ?No facility-administered encounter medications on file as of 04/20/2021.  ? ?Attempted contact with [facility]ig[facility] 3 times on 04/20/21,04/22/21,3/31,23. Unsuccessful outreach. Will attempt contact  next month.  ? ? ?Current tobacco use: (20 cigs=1 pack) ? 2 packs per day ? ?Duration of tobacco use: (years)50 ? ?Prior quit attempts? Yes ?  ?Current medication therapy: (add dosing and remove meds not applicable) ? Bupropion- Bupropion 150 mg 12 hour (started on Monday 01/19/21) - 1 tablet twice daily  ? ? ?Star Rating Drugs:  ?Medication:   Last Fill: Day Supply ?Atorvastatin '20mg'$   03/30/21  90 ?Telmisartan/HCTZ-40-12.'5mg'$   03/24/21 90 ? ? ?Annual wellness visit in last year? Yes ?Most Recent BP reading: ? ?Charlene Brooke, CPP notified ? ?Rodrigus Kilker, CCMA ?Health concierge  ?[phone]  ?

## 2021-04-21 ENCOUNTER — Ambulatory Visit (INDEPENDENT_AMBULATORY_CARE_PROVIDER_SITE_OTHER)
Admission: RE | Admit: 2021-04-21 | Discharge: 2021-04-21 | Disposition: A | Discharge status: Home / Self Care | Payer: PPO | Source: Ambulatory Visit | Attending: Nurse Practitioner | Admitting: Nurse Practitioner

## 2021-04-21 ENCOUNTER — Ambulatory Visit (INDEPENDENT_AMBULATORY_CARE_PROVIDER_SITE_OTHER): Payer: PPO | Admitting: Nurse Practitioner

## 2021-04-21 ENCOUNTER — Telehealth: Payer: Self-pay

## 2021-04-21 ENCOUNTER — Other Ambulatory Visit: Payer: Self-pay

## 2021-04-21 ENCOUNTER — Ambulatory Visit: Payer: PPO | Admitting: Cardiovascular Disease

## 2021-04-21 ENCOUNTER — Encounter: Payer: Self-pay | Admitting: Cardiovascular Disease

## 2021-04-21 VITALS — BP 100/72 | HR 65 | Ht 65.5 in | Wt 192.0 lb

## 2021-04-21 VITALS — BP 108/70 | HR 79 | Temp 96.9°F | Resp 10 | Ht 65.0 in | Wt 192.4 lb

## 2021-04-21 DIAGNOSIS — R0602 Shortness of breath: Secondary | ICD-10-CM

## 2021-04-21 DIAGNOSIS — R5383 Other fatigue: Secondary | ICD-10-CM

## 2021-04-21 DIAGNOSIS — R42 Dizziness and giddiness: Secondary | ICD-10-CM | POA: Insufficient documentation

## 2021-04-21 DIAGNOSIS — G4733 Obstructive sleep apnea (adult) (pediatric): Secondary | ICD-10-CM

## 2021-04-21 DIAGNOSIS — R0789 Other chest pain: Secondary | ICD-10-CM | POA: Insufficient documentation

## 2021-04-21 DIAGNOSIS — I5022 Chronic systolic (congestive) heart failure: Secondary | ICD-10-CM

## 2021-04-21 DIAGNOSIS — I480 Paroxysmal atrial fibrillation: Secondary | ICD-10-CM | POA: Diagnosis not present

## 2021-04-21 HISTORY — DX: Shortness of breath: R06.02

## 2021-04-21 HISTORY — DX: Other fatigue: R53.83

## 2021-04-21 HISTORY — DX: Dizziness and giddiness: R42

## 2021-04-21 LAB — COMPREHENSIVE METABOLIC PANEL
ALT: 24 U/L (ref 0–53)
AST: 17 U/L (ref 0–37)
Albumin: 4.4 g/dL (ref 3.5–5.2)
Alkaline Phosphatase: 73 U/L (ref 39–117)
BUN: 10 mg/dL (ref 6–23)
CO2: 28 mEq/L (ref 19–32)
Calcium: 9.7 mg/dL (ref 8.4–10.5)
Chloride: 101 mEq/L (ref 96–112)
Creatinine, Ser: 1.05 mg/dL (ref 0.40–1.50)
GFR: 74.44 mL/min (ref >60.00)
Glucose, Bld: 81 mg/dL (ref 70–99)
Potassium: 4.1 mEq/L (ref 3.5–5.1)
Sodium: 137 mEq/L (ref 135–145)
Total Bilirubin: 0.7 mg/dL (ref 0.2–1.2)
Total Protein: 7 g/dL (ref 6.0–8.3)

## 2021-04-21 LAB — IBC + FERRITIN
Ferritin: 215.6 ng/mL (ref 22.0–322.0)
Iron: 167 ug/dL — ABNORMAL HIGH (ref 42–165)
Saturation Ratios: 47 % (ref 20.0–50.0)
TIBC: 355.6 ug/dL (ref 250.0–450.0)
Transferrin: 254 mg/dL (ref 212.0–360.0)

## 2021-04-21 LAB — CBC
HCT: 45.9 % (ref 39.0–52.0)
Hemoglobin: 16 g/dL (ref 13.0–17.0)
MCHC: 34.8 g/dL (ref 30.0–36.0)
MCV: 92.5 fl (ref 78.0–100.0)
Platelets: 268 10*3/uL (ref 150.0–400.0)
RBC: 4.97 Mil/uL (ref 4.22–5.81)
RDW: 13.3 % (ref 11.5–15.5)
WBC: 9 10*3/uL (ref 4.0–10.5)

## 2021-04-21 LAB — TSH: TSH: 3.19 u[IU]/mL (ref 0.35–5.50)

## 2021-04-21 LAB — HEMOGLOBIN A1C: Hgb A1c MFr Bld: 5.8 % (ref 4.6–6.5)

## 2021-04-21 LAB — BRAIN NATRIURETIC PEPTIDE: Pro B Natriuretic peptide (BNP): 19 pg/mL (ref 0.0–100.0)

## 2021-04-21 NOTE — Assessment & Plan Note (Signed)
Again ambiguous shortness of breath that can happen at rest and DOE.  Patient has no history of DVT or PE per patient report.  EKG within normal limits in office today.  Patient does have a history of OSA and COPD.  We will obtain chest x-ray, pending result ?

## 2021-04-21 NOTE — Telephone Encounter (Signed)
Noted. ?I will forward this message to [facility] team. Langley Gauss, can you assist patient? ?

## 2021-04-21 NOTE — Telephone Encounter (Signed)
Returned call to schedule annual LDCT.  No answer. Left voicemail with call back number ?

## 2021-04-21 NOTE — Assessment & Plan Note (Signed)
Patient complains of sudden onset of fatigue.  This could be multifactorial given history of anxiety depression, sleep apnea, cardiac issues.  EKG within normal limits in office.  Orthostatic vital signs negative.  Pending lab results.  Patient encouraged to have an appointment with cardiologist and pulmonologist for further evaluation. ?

## 2021-04-21 NOTE — Patient Instructions (Signed)
Nice to see you today ?Be sure to follow up with Dr. Rockey Situ and the lung doctor to see about next steps for the sleep apnea. ?Follow up if symptoms fail to improve or worsen ?I will be in touch with results once I have them ?

## 2021-04-21 NOTE — Telephone Encounter (Signed)
Yeah, we did not discuss cancer screening so I am unsure at to what he is wanting this may have been something that him and kate have talked about ?

## 2021-04-21 NOTE — Patient Instructions (Addendum)
Medication Instructions:  ?Your physician recommends that you continue on your current medications as directed. Please refer to the Current Medication list given to you today.  ? ?If you need a refill on your cardiac medications before your next appointment, please call your pharmacy.  ? ?Lab work: ?No new labs needed ? ?Testing/Procedures: ? ?Your physician has requested that you have an echocardiogram. Echocardiography is a painless test that uses sound waves to create images of your heart. It provides your doctor with information about the size and shape of your heart and how well your heart?s chambers and valves are working. This procedure takes approximately one hour. There are no restrictions for this procedure.  ? ?Follow-Up: ?At [facility], you and your health needs are our priority.  As part of our continuing mission to provide you with exceptional heart care, we have created designated Provider Care Teams.  These Care Teams include your primary Cardiologist (physician) and Advanced Practice Providers (APPs -  Physician Assistants and Nurse Practitioners) who all work together to provide you with the care you need, when you need it. ? ?You will need a follow up appointment in 12 months ? ?Providers on your designated Care Team:   ?Murray Hodgkins, NP ?Christell Faith, PA-C ?Cadence Kathlen Mody, PA-C ? ?COVID-19 Vaccine Information can be found at: [email or web address] For questions related to vaccine distribution or appointments, please email [email or web address] or call [phone].  ? ?

## 2021-04-21 NOTE — Progress Notes (Addendum)
Cardiology Office Note ? ?Date:  04/21/2021  ? ?ID:  Brandon Lopez, DOB 11-Aug-1955, MRN [id number] ? ?PCP:  Pleas Koch, NP  ? ?Chief Complaint  ?Patient presents with  ? 12 month follow up   ?  Patient c/o weakness and fatigue and shortness of breath for the past week. Medications reviewed by the patient verbally.   ? ? ?HPI:  ?Brandon Lopez is a 66 y.o. male past medical history of ?Cardiac cath >10 yr ago, 20% somewhere ?COPD, smoker 2 ppd ?Hyperlipidemia ?pacemaker implanted at [facility] for dyspnea and presyncope.  ?Afib, but duration < 12 hrs, ?started on anticoagulation at last hospitalization  ?Pacer, followed by EP ?Who presents for f/u of his chest pain, atrial fibrillation, shortness of breath ? ?LOV 1/22 ?On wellbutrin since dec 2022 ?Quit smoking 02/01/21, he has gained weight.  ?He is eating more candy.  ?He drinks a few beers a day.  ?He does have sleep apnea but does not use CPAP machine- ?Poor sleep ? ?Rare Lightheaded feeling, not routinely when he stands up ? ?Works for himself, odd jobs ?Can work all day, 4-5 hours ?Limited  by COPD ? ?Rare chest pain, some chronic shortness of breath ?Reports not feeling as well in general ? ?EKG personally reviewed by myself on todays visit ?Atrial paced rhythm rate 62 bpm ? ?Other past medical history reviewed ?Lab work reviewed with him ?HGBA1C 5.6 ?Total chiol 137, LDL 63 ? ?Pacemaker followed by Dr. Caryl Comes ?Pacer downloads reviewed ?afib 1%  Nov 2021 ?Was 2% in 08/2019 ? ?CHADS VASC 1 ?Not on anticoagulation ? ?Echocardiogram July 2019 ?- Left ventricle: The cavity size was normal. Systolic function was ?  mildly to moderately reduced. The estimated ejection fraction was ?  in the range of 40% to 45%. Regional wall motion abnormalities ?  cannot be excluded. Doppler parameters are consistent with ?  abnormal left ventricular relaxation (grade 1 diastolic ?  dysfunction). ?- Left atrium: The atrium was normal in size. ?- Right ventricle: Pacer wire or  catheter noted in right ventricle. ?  Systolic function was normal. ?- Pulmonary arteries: Systolic pressure was within the normal ?  range. ? ?Stress test August 2019 reviewed with him ?Pharmacological myocardial perfusion imaging study with no significant  Ischemia ?Low risk scan ? ?CT scan abdomen  ?moderate aortic atherosclerosis, iliac artery disease, SFA disease ? ?Other past medical history reviewed ?Hospitalized 7/19 for an unusual episode where he had some abdominal discomfort tingling in his chest and not feeling right.  EMS was called.  Notes not available.  There was a comment about unresponsive and cyanotic.  The day before he had had atrial fibrillation for about 6 hours.  ?  ? 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 ? ?PSH:    ?Past Surgical History:  ?Procedure Laterality Date  ? APPENDECTOMY  age 30  ? CARDIAC CATHETERIZATION    ? 7 years ago  ? COLONOSCOPY N/A 05/27/2014  ? Procedure: COLONOSCOPY;  Surgeon: Hulen Luster, MD;  Location: [facility] ENDOSCOPY;  Service: Gastroenterology;  Laterality: N/A;  ? INSERT / REPLACE / REMOVE PACEMAKER    ? 3 years ago  ? ? ?Current Outpatient Medications  ?Medication Sig Dispense Refill  ? albuterol (VENTOLIN  HFA) 108 (90 Base) MCG/ACT inhaler INHALE 2 PUFFS BY MOUTH EVERY 6 HOURS AS NEEDED FOR WHEEZING AND FOR SHORTNESS OF BREATH 18 g 0  ? aspirin EC 81 MG tablet Take 81 mg by mouth daily. Swallow whole.    ? atorvastatin (LIPITOR) 20 MG tablet Take 1 tablet (20 mg total) by mouth daily. For cholesterol. 90 tablet 2  ? buPROPion (WELLBUTRIN SR) 150 MG 12 hr tablet Take 1 tablet by mouth once daily for 5 days, then take one tablet  twice daily thereafter. 180 tablet 0  ? carvedilol (COREG) 3.125 MG tablet Take 1 tablet by mouth twice daily 180 tablet 0  ? ezetimibe (ZETIA) 10 MG tablet Take 1 tablet by mouth once daily 90 tablet 0  ? loratadine (CLARITIN) 10 MG tablet Take 10 mg by mouth daily as needed for allergies.     ? meloxicam (MOBIC) 15 MG tablet Take 0.5-1 tablets (7.5-15 mg total) by mouth daily as needed for pain. 30 tablet 6  ? SYMBICORT 160-4.5 MCG/ACT inhaler Inhale 2 puffs into the lungs 2 (two) times daily. 33 g 0  ? tadalafil (CIALIS) 5 MG tablet Take 1 tablet (5 mg total) by mouth daily. For nocturia and ED. 90 tablet 0  ? telmisartan-hydrochlorothiazide (MICARDIS HCT) 40-12.5 MG tablet Take 1 tablet by mouth once daily for blood pressure 90 tablet 2  ? ?No current facility-administered medications for this visit.  ? ? ?Allergies:   Simvastatin and Lisinopril  ? ?Social History:  The patient  reports that he quit smoking about 2 months ago. His smoking use included cigarettes. He started smoking about 59 years ago. He has a 100.00 pack-year smoking history. He has never used smokeless tobacco. He reports current alcohol use of about 8.0 - 14.0 standard drinks per week. He reports current drug use. Drug: Marijuana.  ? ?Family History:   family history includes Arthritis in his mother; Cancer in his paternal grandfather; Heart disease in his father; Hyperlipidemia in his father; Hypertension in his father.  ? ? ?Review of Systems: ?Review of Systems  ?Constitutional: Negative.   ?Respiratory:  Positive for cough and shortness of breath.   ?Cardiovascular: Negative.   ?Gastrointestinal: Negative.   ?Musculoskeletal: Negative.   ?Neurological: Negative.   ?Psychiatric/Behavioral: Negative.    ?All other systems reviewed and are negative. ?PHYSICAL EXAM: ?VS:  BP 100/72 (BP Location: Left Arm, Patient Position: Sitting, Cuff Size: Normal)   Pulse 65   Ht 5' 5.5" (1.664 m)   Wt 192 lb (87.1 kg)   SpO2 98%   BMI 31.46 kg/m?  ,  BMI Body mass index is 31.46 kg/m?Marland Kitchen ?Constitutional:  oriented to person, place, and time. No distress.  ?HENT:  ?Head: Grossly normal ?Eyes:  no discharge. No scleral icterus.  ?Neck: No JVD, no carotid bruits  ?Cardiovascular: Regular rate and rhythm, no murmurs appreciated ?Pulmonary/Chest: Clear to auscultation bilaterally, no wheezes or rails ?Abdominal: Soft.  no distension.  no tenderness.  ?Musculoskeletal: Normal range of motion ?Neurological:  normal muscle tone. Coordination normal. No atrophy ?Skin: Skin warm and dry ?Psychiatric: normal affect, pleasant ? ?Recent Labs: ?12/11/2020: ALT 29; BUN 13; Creatinine, Ser 0.85; Hemoglobin 15.6; Platelets 225.0; Potassium 4.1; Sodium 136  ? ? ?Lipid Panel ?Lab Results  ?Component Value Date  ? CHOL 145 12/11/2020  ? HDL 46.20 12/11/2020  ? [place] 72 12/11/2020  ? TRIG 136.0 12/11/2020  ? ? ?Wt Readings from Last 3 Encounters:  ?04/21/21 192 lb (87.1 kg)  ?04/21/21  192 lb 6 oz (87.3 kg)  ?04/09/21 192 lb (87.1 kg)  ?  ? ?ASSESSMENT AND PLAN: ? ?Chronic obstructive pulmonary disease, unspecified COPD type ([place]) ?Smoking cessation recommended ?Chronic shortness of breath ? ?Paroxysmal atrial fibrillation ([facility])  ?Maintaining normal sinus rhythm, atrial paced ?Pacemaker downloads reviewed  ?Low burden of atrial fibrillation ? ?CAD, ?Coronary calcium noted on prior scans ?On lipitor and zetia,  ?Recommended smoking cessation ?Unable to tolerate higher dose Lipitor ?Echocardiogram ordered, prior study 2019 EF 40 to 45% ? ?Essential hypertension ?Blood pressure is well controlled on today's visit. No changes made to the medications. ? ?Pacemaker ?Followed by Dr. Caryl Comes ? ?Edema, unspecified type ?No significant edema ? ?Cigarette smoker ?Long discussion, ?Does not want Chantix, ? ?Shortness of breath ?Myoview: 08/2017: low risk ?Plan as above, repeat echo ?Worsening symptoms would repeat Myoview/cardiac CTA ? ? ? Total encounter time more than 30 minutes ? Greater than  50% was spent in counseling and coordination of care with the patient ? ? ?No orders of the defined types were placed in this encounter. ? ? ? ?Signed, ?Esmond Plants, M.D., Ph.D. ?04/21/2021  ?Welcome

## 2021-04-21 NOTE — Assessment & Plan Note (Signed)
Ambiguous lightheadedness.  Patient cannot tie within the event.  Can happen at rest or with activity.  Patient does have a pacemaker EKG within normal limits in office.  Orthostatics negative for begin of acute change that to be the culprit.  Patient's neuro exam completely benign in office today.  Did encourage patient to follow-up with cardiologist for further evaluation.  Pending lab results ?

## 2021-04-21 NOTE — Telephone Encounter (Signed)
I think patient was seen by you today. Did I need to do anything further?  ?

## 2021-04-21 NOTE — Assessment & Plan Note (Signed)
Maintained on a CPAP/BiPAP.  Plans to make an appointment with pulmonology.  This could be contributing factor to patient's fatigue ?

## 2021-04-21 NOTE — Progress Notes (Signed)
? ?Acute Office Visit ? ?Subjective:  ? ? Patient ID: Brandon Lopez, male    DOB: [birth year], 66 y.o.   MRN: [id number] ? ?Chief Complaint  ?Patient presents with  ? Fatigue  ?  X 1 week. Quit smoking 02/01/21, he has gained weight. He is eating more candy. He drinks a few beers a day. He does have sleep apnea but does not use CPAP machine-has pulmonologist appointment set up to follow up. Would like to rule out some blood work on things that are possibly contributing. Does get some chest pain off and on. More SOB than normal.   ? ? ?HPI ?Patient is in today for fatigue ? ?States that he recently stopped smoking.and has been eating more candy and gained weight. States that he has a family history of diabetes. States that he is concerned with the weight gain and candy eating. No personal history of being diagnosed with DM ? ?Does have OSA but does not currenlty have a machine states he has tried a cpap and Bipap in the past. Does not currently have a machine at home. He plans on getting an appointment with his pulmonologist. States that he feels better off the CPAP/BiPAP  ? ?Lightheaded feeling: states has happened in the past where he has had syncope., per report several time. Has not happened recently. Has had the lightheaded feeling without passing out. Started approx 3 weeks ago. It is intermittent in nature and patient can not find a provocating event. States can happen with position changes and just at rest. Does have a pacemaker implanted. He will close his eye when this happens so is unsure if vision is affected. ? ?Past Medical History:  ?Diagnosis Date  ? Actinic keratosis   ? Arthritis   ? Chronic systolic CHF (congestive heart failure) ([place])   ? a. echo 6/16: EF 55-60%, Gr1DD, LA nl, PASP nl; b. echo 7/19: EF 40-45%, unable to exclude RWMA, Gr1DD, LA nl, RVSF nl, PASP nl  ? COPD (chronic obstructive pulmonary disease) ([facility])   ? Depression   ? Diverticulosis   ? Found during colonoscopy  ?  Hypertension   ? Kidney stones   ? PAF (paroxysmal atrial fibrillation) ([place])   ? a. short episodes previously noted on PPM interrogation; b. started on Xarelto 07/2017 given further episodes of Afib noted during interrogation; c. CHADS2VASc at least 3 (CHF, HTN, vascualr disease with aortic calcifications noted on CT)  ? 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 Presence of permanent cardiac pacemaker   ? a. Biotronik; Dayna Barker DR-T [id number]; placed 08/2012  ? Sleep apnea   ? Symptomatic bradycardia   ? a. s/p Biotronik; Dayna Barker DR-T [id number]; placed 08/2012 at [place]  ? ? ?Past Surgical History:  ?Procedure Laterality Date  ? APPENDECTOMY  age 33  ? CARDIAC CATHETERIZATION    ? 7 years ago  ? COLONOSCOPY N/A 05/27/2014  ? Procedure: COLONOSCOPY;  Surgeon: Hulen Luster, MD;  Location: [facility] ENDOSCOPY;  Service: Gastroenterology;  Laterality: N/A;  ? INSERT / REPLACE / REMOVE PACEMAKER    ? 3 years ago  ? ? ?Family History  ?Problem Relation Age of Onset  ? Arthritis Mother   ? Heart disease Father   ? Hyperlipidemia Father   ? Hypertension Father   ? Cancer Paternal Grandfather   ? ? ?Social History  ? ?Socioeconomic History  ? Marital status: Single  ?  Spouse name: Not on file  ? Number of children: Not on file  ?  Years of education: Not on file  ? Highest education level: Not on file  ?Occupational History  ? Not on file  ?Tobacco Use  ? Smoking status: Heavy Smoker  ?  Packs/day: 2.00  ?  Years: 50.00  ?  Pack years: 100.00  ?  Types: Cigarettes  ?  Start date: 1964  ? Smokeless tobacco: Never  ? Tobacco comments:  ?  started smoking at age 5, 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  ?Vaping Use  ? Vaping Use: Never used  ?Substance and Sexual Activity  ? Alcohol use: Yes  ?  Alcohol/week: 8.0 - 14.0 standard drinks  ?  Types: 8 - 14 Cans of beer per week  ? Drug use: Yes  ?  Types: Marijuana  ? Sexual activity: Not on file  ?Other Topics Concern  ? Not on file  ?Social History Narrative  ? Not on file  ? ?Social Determinants of Health  ? ?Financial  Resource Strain: Low Risk   ? Difficulty of Paying Living Expenses: Not very hard  ?Food Insecurity: No Food Insecurity  ? Worried About Charity fundraiser in the Last Year: Never true  ? Ran Out of Food in the Last Year: Never true  ?Transportation Needs: No Transportation Needs  ? Lack of Transportation (Medical): No  ? Lack of Transportation (Non-Medical): No  ?Physical Activity: Inactive  ? Days of Exercise per Week: 0 days  ? Minutes of Exercise per Session: 0 min  ?Stress: Stress Concern Present  ? Feeling of Stress : Very much  ?Social Connections: Not on file  ?Intimate Partner Violence: Not At Risk  ? Fear of Current or Ex-Partner: No  ? Emotionally Abused: No  ? Physically Abused: No  ? Sexually Abused: No  ? ? ?Outpatient Medications Prior to Visit  ?Medication Sig Dispense Refill  ? albuterol (VENTOLIN HFA) 108 (90 Base) MCG/ACT inhaler INHALE 2 PUFFS BY MOUTH EVERY 6 HOURS AS NEEDED FOR WHEEZING AND FOR SHORTNESS OF BREATH 18 g 0  ? aspirin EC 81 MG tablet Take 81 mg by mouth daily. Swallow whole.    ? atorvastatin (LIPITOR) 20 MG tablet Take 1 tablet (20 mg total) by mouth daily. For cholesterol. 90 tablet 2  ? buPROPion (WELLBUTRIN SR) 150 MG 12 hr tablet Take 1 tablet by mouth once daily for 5 days, then take one tablet twice daily thereafter. 180 tablet 0  ? carvedilol (COREG) 3.125 MG tablet Take 1 tablet by mouth twice daily 180 tablet 0  ? ezetimibe (ZETIA) 10 MG tablet Take 1 tablet by mouth once daily 90 tablet 0  ? loratadine (CLARITIN) 10 MG tablet Take 10 mg by mouth daily as needed for allergies.     ? meloxicam (MOBIC) 15 MG tablet Take 0.5-1 tablets (7.5-15 mg total) by mouth daily as needed for pain. 30 tablet 6  ? SYMBICORT 160-4.5 MCG/ACT inhaler Inhale 2 puffs into the lungs 2 (two) times daily. 33 g 0  ? tadalafil (CIALIS) 5 MG tablet Take 1 tablet (5 mg total) by mouth daily. For nocturia and ED. 90 tablet 0  ? telmisartan-hydrochlorothiazide (MICARDIS HCT) 40-12.5 MG tablet Take 1  tablet by mouth once daily for blood pressure 90 tablet 2  ? ?No facility-administered medications prior to visit.  ? ? ?Allergies  ?Allergen Reactions  ? Simvastatin Diarrhea  ? Lisinopril Cough  ? ? ?Review of Systems  ?Constitutional:  Positive for chills and fatigue. Negative for fever.  ?HENT:  Negative for congestion, ear  discharge, ear pain, sinus pressure, sinus pain and sore throat.   ?Respiratory:  Positive for cough (couging fits) and shortness of breath (new SHOB intemittent with DOE and rest).   ?Cardiovascular:  Negative for chest pain (intermittent in the past few weeks. no proovcatoin).  ?Gastrointestinal:  Negative for diarrhea, nausea and vomiting.  ?     BM daily ?  ?Genitourinary:  Negative for difficulty urinating, dysuria and hematuria.  ?     Nocturia has improved decreased nocturia  ?Neurological:  Positive for light-headedness. Negative for dizziness and headaches.  ? ?   ?Objective:  ?  ?Physical Exam ?Vitals and nursing note reviewed.  ?Constitutional:   ?   Appearance: Normal appearance. He is obese.  ?HENT:  ?   Right Ear: Tympanic membrane, ear canal and external ear normal.  ?   Left Ear: Tympanic membrane, ear canal and external ear normal.  ?   Mouth/Throat:  ?   Mouth: Mucous membranes are moist.  ?   Pharynx: Oropharynx is clear.  ?Eyes:  ?   Extraocular Movements: Extraocular movements intact.  ?   Pupils: Pupils are equal, round, and reactive to light.  ?Neck:  ?   Vascular: No carotid bruit.  ?Cardiovascular:  ?   Rate and Rhythm: Normal rate and regular rhythm.  ?   Pulses: Normal pulses.  ?   Heart sounds: Normal heart sounds.  ?Pulmonary:  ?   Effort: Pulmonary effort is normal.  ?   Breath sounds: Normal breath sounds.  ?Abdominal:  ?   General: Bowel sounds are normal.  ?Musculoskeletal:  ?   Right lower leg: No edema.  ?   Left lower leg: No edema.  ?Lymphadenopathy:  ?   Cervical: No cervical adenopathy.  ?Neurological:  ?   General: No focal deficit present.  ?   Mental  Status: He is alert.  ?   Deep Tendon Reflexes:  ?   Reflex Scores: ?     Bicep reflexes are 2+ on the right side and 2+ on the left side. ?     Patellar reflexes are 2+ on the right side and 2+ on the left side.

## 2021-04-22 ENCOUNTER — Ambulatory Visit (INDEPENDENT_AMBULATORY_CARE_PROVIDER_SITE_OTHER): Payer: PPO

## 2021-04-22 DIAGNOSIS — I480 Paroxysmal atrial fibrillation: Secondary | ICD-10-CM | POA: Diagnosis not present

## 2021-04-22 MED ORDER — PERFLUTREN LIPID MICROSPHERE
1.0000 mL | INTRAVENOUS | Status: AC | PRN
  Administered 2021-04-22: 2 mL via INTRAVENOUS

## 2021-04-22 NOTE — Telephone Encounter (Signed)
Called pt and left detailed message with appt date and time for lung cancer screening CT scan. Scan is scheduled 06/17/21 9:00 at [facility] in [place]. I left the address for the pt on the voicemail along with my contact number if he has any further questions.  ?

## 2021-04-24 LAB — ECHOCARDIOGRAM COMPLETE
AR max vel: 2.07 cm2
AV Area VTI: 1.9 cm2
AV Area mean vel: 2.01 cm2
AV Mean grad: 4 mmHg
AV Peak grad: 8.4 mmHg
Ao pk vel: 1.45 m/s
S' Lateral: 3.3 cm

## 2021-04-28 ENCOUNTER — Encounter: Payer: Self-pay | Admitting: Internal Medicine

## 2021-04-28 ENCOUNTER — Ambulatory Visit (INDEPENDENT_AMBULATORY_CARE_PROVIDER_SITE_OTHER): Payer: PPO | Admitting: Internal Medicine

## 2021-04-28 VITALS — BP 108/72 | HR 75 | Ht 65.5 in | Wt 193.0 lb

## 2021-04-28 DIAGNOSIS — I493 Ventricular premature depolarization: Secondary | ICD-10-CM | POA: Diagnosis not present

## 2021-04-28 DIAGNOSIS — I480 Paroxysmal atrial fibrillation: Secondary | ICD-10-CM | POA: Diagnosis not present

## 2021-04-28 DIAGNOSIS — I495 Sick sinus syndrome: Secondary | ICD-10-CM | POA: Diagnosis not present

## 2021-04-28 DIAGNOSIS — Z950 Presence of cardiac pacemaker: Secondary | ICD-10-CM

## 2021-04-28 DIAGNOSIS — I5022 Chronic systolic (congestive) heart failure: Secondary | ICD-10-CM

## 2021-04-28 LAB — PACEMAKER DEVICE OBSERVATION

## 2021-04-28 NOTE — Progress Notes (Signed)
? ? ? ? ?Patient Care Team: ?Pleas Koch, NP as PCP - General (Nurse Practitioner) ?[name], [facility] as Pharmacist (Pharmacist) ?Binnie Rail, DC as Referring Physician (Chiropractic Medicine) ?Owens Loffler, MD as Consulting Physician (Family Medicine) ? ? ?HPI ? ?Brandon Lopez is a 66 y.o. male ?Seen in follow-up for pacemaker-Biotronik  implanted at [facility] for dyspnea and presyncope. Hx of Afib,  anticoagulation started and then stopped as all SCAF-duration < 12 hrs.  PVCs. Orthostatic intolerance  ? ?The patient denies chest pain, shortness of breath, nocturnal dyspnea, orthopnea or peripheral edema.  There have been no palpitations, lightheadedness or syncope.  ? ?About a month ago he developed profound fatigue which is gradually dissipated.  No clear cause, was not tested for COVID. ? ?Following up on his sleep apnea, he is to see Dr. Chase Caller concerning the implantable device ?Records and Results Reviewed ?DATE TEST EF   ?6/16 Echo  55-60 %  normal  ?5/16    Myoview  50 %   No ischemia Echo done to clarify EF   ?7/19 Echo  40-45%   ?8/19 MYOVIEW  57% No Ischemia  ?3/23  Echo  60-65%   ?  ?Date Cr K Hgb  ?11/19 0.76 3.9 15.7<<17.2  ?12/21 0.76 3.6 15.8  ?3/23 1.05 4.1 16.0  ? ? ? ? ?   ? ?Past Medical History:  ?Diagnosis Date  ? Actinic keratosis   ? Arthritis   ? Chronic systolic CHF (congestive heart failure) ([place])   ? a. echo 6/16: EF 55-60%, Gr1DD, LA nl, PASP nl; b. echo 7/19: EF 40-45%, unable to exclude RWMA, Gr1DD, LA nl, RVSF nl, PASP nl  ? COPD (chronic obstructive pulmonary disease) (HCC)   ? Depression   ? Diverticulosis   ? Found during colonoscopy  ? Hypertension   ? Kidney stones   ? PAF (paroxysmal atrial fibrillation) ([place])   ? a. short episodes previously noted on PPM interrogation; b. started on Xarelto 07/2017 given further episodes of Afib noted during interrogation; c. CHADS2VASc at least 3 (CHF, HTN, vascualr disease with aortic calcifications noted on CT)  ?  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 Presence of permanent cardiac pacemaker   ? a. Biotronik; Dayna Barker DR-T [id number]; placed 08/2012  ? Sleep apnea   ? Symptomatic bradycardia   ? a. s/p Biotronik; Dayna Barker DR-T [id number]; placed 08/2012 at [place]  ? ? ?Past Surgical History:  ?Procedure Laterality Date  ? APPENDECTOMY  age 64  ? CARDIAC CATHETERIZATION    ? 7 years ago  ? COLONOSCOPY N/A 05/27/2014  ? Procedure: COLONOSCOPY;  Surgeon: Hulen Luster, MD;  Location: [facility] ENDOSCOPY;  Service: Gastroenterology;  Laterality: N/A;  ? INSERT / REPLACE / REMOVE PACEMAKER    ? 3 years ago  ? ? ?Current Outpatient Medications  ?Medication Sig Dispense Refill  ? albuterol (VENTOLIN HFA) 108 (90 Base) MCG/ACT inhaler INHALE 2 PUFFS BY MOUTH EVERY 6 HOURS AS NEEDED FOR WHEEZING AND FOR SHORTNESS OF BREATH 18 g 0  ? aspirin EC 81 MG tablet Take 81 mg by mouth daily. Swallow whole.    ? atorvastatin (LIPITOR) 20 MG tablet Take 1 tablet (20 mg total) by mouth daily. For cholesterol. 90 tablet 2  ? buPROPion (WELLBUTRIN SR) 150 MG 12 hr tablet Take 1 tablet by mouth once daily for 5 days, then take one tablet twice daily thereafter. 180 tablet 0  ? carvedilol (COREG) 3.125 MG tablet Take 1 tablet by mouth twice  daily 180 tablet 0  ? ezetimibe (ZETIA) 10 MG tablet Take 1 tablet by mouth once daily 90 tablet 0  ? loratadine (CLARITIN) 10 MG tablet Take 10 mg by mouth daily as needed for allergies.     ? meloxicam (MOBIC) 15 MG tablet Take 0.5-1 tablets (7.5-15 mg total) by mouth daily as needed for pain. 30 tablet 6  ? SYMBICORT 160-4.5 MCG/ACT inhaler Inhale 2 puffs into the lungs 2 (two) times daily. 33 g 0  ? tadalafil (CIALIS) 5 MG tablet Take 1 tablet (5 mg total) by mouth daily. For nocturia and ED. 90 tablet 0  ? telmisartan-hydrochlorothiazide (MICARDIS HCT) 40-12.5 MG tablet Take 1 tablet by mouth once daily for blood pressure 90 tablet 2  ? ?No current facility-administered medications for this visit.  ? ? ?Allergies  ?Allergen Reactions  ?  Simvastatin Diarrhea  ? Lisinopril Cough  ? ? ? ? ?Review of Systems negative except from HPI and PMH ? ?Physical Exam ?BP 108/72 (BP Location: Left Arm, Patient Position: Sitting, Cuff Size: Normal)   Pulse 75   Ht 5' 5.5" (1.664 m)   Wt 193 lb (87.5 kg)   SpO2 96%   BMI 31.63 kg/m?  ?Well developed and well nourished in no acute distress ?HENT normal ?Neck supple with JVP-flat ?Clear ?Device pocket well healed; without hematoma or erythema.  There is no tethering  ?Regular rate and rhythm, no  murmur ?Abd-soft with active BS ?No Clubbing cyanosis  edema ?Skin-warm and dry ?A & Oriented  Grossly normal sensory and motor function ? ?ECG atrial pacing at 75 ?Intervals 26/09/37 ?Poor R wave progression ? ? ?Assessment and  Plan ? ?Sinus bradycardia / 1AVB ? ?Pacemaker-Biotronik   ? ?Atrial fibrillation-paroxysmal SCAF ? ?Dyspnea on exertion/HFpEF ? ?Chest pain   ? ?Hypertension ? ?Cardiomyopathy-mild ? ?Orthostatic lightheadedness ? ?PVC  ? ?Cold intolerance ? ?Cigarette abuse-has stopped smoking as of 1/23 WOW  ? ?Continues with brief atrial fibrillation, i.e. SCAF.  No anticoagulation indicated. ? ?Lightheadedness is much improved. ? ?Chest pain is quiescient.  Interval resolution of his cardiomyopathy, we will continue him on Micardis and carvedilol. ? ?Remains on aspirin and statin for calcium noted on prior scanning.  LDL at 72 11/22, appropriate for primary prevention ? ?Current medicines are reviewed at length with the patient today .  The patient does not  have concerns regarding medicines. ? ?

## 2021-04-28 NOTE — Patient Instructions (Signed)

## 2021-05-07 MED ORDER — TAMSULOSIN HCL 0.4 MG PO CAPS
0.4000 mg | ORAL_CAPSULE | Freq: Every evening | ORAL | 0 refills | Status: DC
Start: 2021-05-07 — End: 2021-06-30

## 2021-05-08 ENCOUNTER — Other Ambulatory Visit: Payer: Self-pay | Admitting: Primary Care

## 2021-05-08 DIAGNOSIS — F17200 Nicotine dependence, unspecified, uncomplicated: Secondary | ICD-10-CM

## 2021-05-13 ENCOUNTER — Telehealth: Payer: Self-pay

## 2021-05-13 NOTE — Progress Notes (Signed)
? ? ?  Chronic Care Management ?Pharmacy Assistant  ? ?Name: Brandon Lopez  MRN: [id number] DOB: May 14, 1955 ? ?Reason for Encounter: CCM Counsellor) ?  ?Medications: ?Outpatient Encounter Medications as of 05/13/2021  ?Medication Sig  ? albuterol (VENTOLIN HFA) 108 (90 Base) MCG/ACT inhaler INHALE 2 PUFFS BY MOUTH EVERY 6 HOURS AS NEEDED FOR WHEEZING AND FOR SHORTNESS OF BREATH  ? aspirin EC 81 MG tablet Take 81 mg by mouth daily. Swallow whole.  ? atorvastatin (LIPITOR) 20 MG tablet Take 1 tablet (20 mg total) by mouth daily. For cholesterol.  ? buPROPion (WELLBUTRIN SR) 150 MG 12 hr tablet Take 1 tablet (150 mg total) by mouth 2 (two) times daily. For smoking cessation  ? carvedilol (COREG) 3.125 MG tablet Take 1 tablet by mouth twice daily  ? ezetimibe (ZETIA) 10 MG tablet Take 1 tablet by mouth once daily  ? loratadine (CLARITIN) 10 MG tablet Take 10 mg by mouth daily as needed for allergies.   ? meloxicam (MOBIC) 15 MG tablet Take 0.5-1 tablets (7.5-15 mg total) by mouth daily as needed for pain.  ? SYMBICORT 160-4.5 MCG/ACT inhaler Inhale 2 puffs into the lungs 2 (two) times daily.  ? tadalafil (CIALIS) 5 MG tablet Take 1 tablet (5 mg total) by mouth daily. For nocturia and ED.  ? tamsulosin (FLOMAX) 0.4 MG CAPS capsule Take 1 capsule (0.4 mg total) by mouth every evening. For urine flow  ? telmisartan-hydrochlorothiazide (MICARDIS HCT) 40-12.5 MG tablet Take 1 tablet by mouth once daily for blood pressure  ? ?No facility-administered encounter medications on file as of 05/13/2021.  ? ?[name] was contacted to remind of upcoming telephone visit with Charlene Brooke on 05/18/2021 at 3:45. Patient was reminded to have any blood glucose and blood pressure readings available for review at appointment.  ? ?Patient confirmed appointment. ? ?Are you having any problems with your medications? No  ? ?Do you have any concerns you like to discuss with the pharmacist? No ? ?Star Rating  Drugs: ?Medication:    Last Fill: Day Supply ?Atorvastatin 20 mg   03/30/2021 90 ?Telmisartan-HCTZ 40-12.5 mg 03/24/2021 90  ? ?Charlene Brooke, CPP notified ? ?Marijean Niemann, RMA ?Clinical Pharmacy Assistant ?[phone] ? ? ? ? ?

## 2021-05-18 ENCOUNTER — Telehealth: Payer: PPO

## 2021-05-18 ENCOUNTER — Telehealth: Payer: Self-pay | Admitting: Pharmacist

## 2021-05-18 NOTE — Telephone Encounter (Signed)
?  Chronic Care Management  ? ?Outreach Note ? ?05/18/2021 ?Name: Khyrin Trevathan MRN: [id number] DOB: Jun 24, 1955 ? ?Referred by: Pleas Koch, NP ? ?Patient had a phone appointment scheduled with clinical pharmacist today. ? ?An unsuccessful telephone outreach was attempted today. The patient was referred to the pharmacist for assistance with medications, care management and care coordination.  ? ?Patient will NOT be penalized in any way for missing a CCM appointment. The no-show fee does not apply. ? ?If possible, a message was left to return call to: [phone] or to [facility]. ? ?Charlene Brooke, PharmD, BCACP ?Clinical Pharmacist ?[facility] ?[phone] ? ? ?

## 2021-05-18 NOTE — Progress Notes (Deleted)
Chronic Care Management Pharmacy Note  05/18/2021 Name:  Brandon Lopez MRN:  [id number] DOB:  August 27, 1955  Summary: CCM F/U visit Pt tolerating bupropion well so far. Started 01/19/21. He plans to set quit date within 2 weeks from start. We discussed quit plan and online tools. Chronic conditions including HTN, HLD, COPD, reviewed. Pt is doing well. No adherence or cost concerns. Recommend annual follow up with pulmonology and follow up with cardiology regarding October ED visit for chest pain.   Recommendations/Changes made from today's visit: ***  Follow Up Plan: -[place] will call patient *** -Pharmacist follow up televisit scheduled for *** -Pulmonary appt 05/29/21 (Dr Chase Caller). LDCT 06/17/21 -PCP F/U 06/02/21   Subjective: Brandon Lopez is an 66 y.o. year old male who is a primary patient of Pleas Koch, NP.  The CCM team was consulted for assistance with disease management and care coordination needs.    Engaged with patient by telephone for follow up visit in response to provider referral for pharmacy case management and/or care coordination services.   Consent to Services:  The patient was given information about Chronic Care Management services, agreed to services, and gave verbal consent prior to initiation of services.  Please see initial visit note for detailed documentation.   Patient Care Team: Pleas Koch, NP as PCP - General (Nurse Practitioner) [name], [facility] as Pharmacist (Pharmacist) Binnie Rail, DC as Referring Physician (Chiropractic Medicine) Owens Loffler, MD as Consulting Physician (Family Medicine)  Recent office visits: 05/07/21 pt message - started Tamsulosin 0.4 mg for bladder emptying.  04/21/21 NP Karl Ito OV: c/o SOB, fatigue. Labs normal. Advised f/u with pulmonary, cardiology.  04/09/21 NP Allie Bossier OV: c/o ED, nocturia. Rx'd tadalafil 5 mg daily.  Recent consult visits: 04/28/21 Dr Caryl Comes  (Cardiology): f/u sick sinus syndrome, pacemaker. No med changes.  04/21/21 Dr Rockey Situ (Cardiology): f/u Afib. No med changes.  Hospital visits: {Hospital DC Yes/No:25215}   Objective:  Lab Results  Component Value Date   CREATININE 1.05 04/21/2021   BUN 10 04/21/2021   GFR 74.44 04/21/2021   GFRNONAA >60 11/11/2020   GFRAA >60 08/09/2017   NA 137 04/21/2021   K 4.1 04/21/2021   CALCIUM 9.7 04/21/2021   CO2 28 04/21/2021   GLUCOSE 81 04/21/2021    Lab Results  Component Value Date/Time   HGBA1C 5.8 04/21/2021 11:33 AM   HGBA1C 5.6 01/11/2020 12:27 PM   GFR 74.44 04/21/2021 11:33 AM   GFR 91.35 12/11/2020 11:45 AM    Last diabetic Eye exam: No results found for: HMDIABEYEEXA  Last diabetic Foot exam: No results found for: HMDIABFOOTEX   Lab Results  Component Value Date   CHOL 145 12/11/2020   HDL 46.20 12/11/2020   LDLCALC 72 12/11/2020   TRIG 136.0 12/11/2020   CHOLHDL 3 12/11/2020       Latest Ref Rng & Units 04/21/2021   11:33 AM 12/11/2020   11:45 AM 05/04/2020    2:45 PM  Hepatic Function  Total Protein 6.0 - 8.3 g/dL 7.0   6.6   6.2    Albumin 3.5 - 5.2 g/dL 4.4   4.2   3.3    AST 0 - 37 U/L _0 ALT 0 - 53 U/L _1 Alk Phosphatase 39 - 117 U/L 73   82   58    Total Bilirubin 0.2 -  1.2 mg/dL 0.7   0.7   1.1      Lab Results  Component Value Date/Time   TSH 3.19 04/21/2021 11:33 AM   TSH 1.93 12/12/2018 10:30 AM       Latest Ref Rng & Units 04/21/2021   11:33 AM 12/11/2020   11:45 AM 11/11/2020   12:28 PM  CBC  WBC 4.0 - 10.5 K/uL 9.0   8.6   9.7    Hemoglobin 13.0 - 17.0 g/dL 16.0   15.6   16.1    Hematocrit 39.0 - 52.0 % 45.9   46.0   45.0    Platelets 150.0 - 400.0 K/uL 268.0   225.0   266      No results found for: VD25OH  Clinical ASCVD: {YES/NO:21197} The 10-year ASCVD risk score (Arnett DK, et al., 2019) is: 12.9%   Values used to calculate the score:     Age: 66 years     Sex: Male     Is Non-Hispanic  African American: No     Diabetic: No     Tobacco smoker: Yes     Systolic Blood Pressure: 108 mmHg     Is BP treated: Yes     HDL Cholesterol: 46.2 mg/dL     Total Cholesterol: 145 mg/dL       07/14/2020    8:14 AM 01/11/2020   11:47 AM 12/11/2018    2:51 PM  Depression screen PHQ 2/9  Decreased Interest 0 0 0  Down, Depressed, Hopeless 0 0 0  PHQ - 2 Score 0 0 0  Altered sleeping 0  0  Tired, decreased energy 0  0  Change in appetite 0  0  Feeling bad or failure about yourself  0  0  Trouble concentrating 0  0  Moving slowly or fidgety/restless 0  0  Suicidal thoughts 0  0  PHQ-9 Score 0  0  Difficult doing work/chores Not difficult at all  Not difficult at all     ***Other: (CHADS2VASc if Afib, MMRC or CAT for COPD, ACT, DEXA)  Social History   Tobacco Use  Smoking Status Former   Packs/day: 2.00   Years: 50.00   Pack years: 100.00   Types: Cigarettes   Start date: 1964   Quit date: 02/01/2021   Years since quitting: 0.2  Smokeless Tobacco Never  Tobacco Comments   started smoking at age 7, 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   BP Readings from Last 3 Encounters:  04/28/21 108/72  04/21/21 100/72  04/21/21 108/70   Pulse Readings from Last 3 Encounters:  04/28/21 75  04/21/21 65  04/21/21 79   Wt Readings from Last 3 Encounters:  04/28/21 193 lb (87.5 kg)  04/21/21 192 lb (87.1 kg)  04/21/21 192 lb 6 oz (87.3 kg)   BMI Readings from Last 3 Encounters:  04/28/21 31.63 kg/m  04/21/21 31.46 kg/m  04/21/21 32.01 kg/m    Assessment/Interventions: Review of patient past medical history, allergies, medications, health status, including review of consultants reports, laboratory and other test data, was performed as part of comprehensive evaluation and provision of chronic care management services.   SDOH:  (Social Determinants of Health) assessments and interventions performed: {yes/no:20286}  SDOH Screenings   Alcohol Screen: Medium Risk   Last Alcohol  Screening Score (AUDIT): 13  Depression (PHQ2-9): Low Risk    PHQ-2 Score: 0  Financial Resource Strain: Low Risk    Difficulty of Paying Living Expenses: Not very hard  Food Insecurity:   No Food Insecurity   Worried About Charity fundraiser in the Last Year: Never true   Ran Out of Food in the Last Year: Never true  Housing: Low Risk    Last Housing Risk Score: 0  Physical Activity: Inactive   Days of Exercise per Week: 0 days   Minutes of Exercise per Session: 0 min  Social Connections: Not on file  Stress: Stress Concern Present   Feeling of Stress : Very much  Tobacco Use: Medium Risk   Smoking Tobacco Use: Former   Smokeless Tobacco Use: Never   Passive Exposure: Not on file  Transportation Needs: No Transportation Needs   Lack of Transportation (Medical): No   Lack of Transportation (Non-Medical): No    CCM Care Plan  Allergies  Allergen Reactions   Simvastatin Diarrhea   Lisinopril Cough    Medications Reviewed Today     Reviewed by Ronaldo Miyamoto, CMA (Certified Medical Assistant) on 04/28/21 at [place] List Status: <None>   Medication Order Taking? Sig Documenting Provider Last Dose Status Informant  albuterol (VENTOLIN HFA) 108 (90 Base) MCG/ACT inhaler [id number] Yes INHALE 2 PUFFS BY MOUTH EVERY 6 HOURS AS NEEDED FOR WHEEZING AND FOR SHORTNESS OF Linard Millers, MD Taking Active   aspirin EC 81 MG tablet [id number] Yes Take 81 mg by mouth daily. Swallow whole. [provider] Taking Active   atorvastatin (LIPITOR) 20 MG tablet [id number] Yes Take 1 tablet (20 mg total) by mouth daily. For cholesterol. Pleas Koch, NP Taking Active   buPROPion [facility] SR) 150 MG 12 hr tablet [id number] Yes Take 1 tablet by mouth once daily for 5 days, then take one tablet twice daily thereafter. Pleas Koch, NP Taking Active   carvedilol (COREG) 3.125 MG tablet [id number] Yes Take 1 tablet by mouth twice daily Deboraha Sprang, MD Taking Active    ezetimibe (ZETIA) 10 MG tablet [id number] Yes Take 1 tablet by mouth once daily Gollan, Kathlene November, MD Taking Active   loratadine (CLARITIN) 10 MG tablet [id number] Yes Take 10 mg by mouth daily as needed for allergies.  [provider] Taking Active Self  meloxicam (MOBIC) 15 MG tablet [id number] Yes Take 0.5-1 tablets (7.5-15 mg total) by mouth daily as needed for pain. Hilts, Legrand Como, MD Taking Active   SYMBICORT 160-4.5 MCG/ACT inhaler [id number] Yes Inhale 2 puffs into the lungs 2 (two) times daily. Pleas Koch, NP Taking Active   tadalafil (CIALIS) 5 MG tablet [id number] Yes Take 1 tablet (5 mg total) by mouth daily. For nocturia and ED. Pleas Koch, NP Taking Active   telmisartan-hydrochlorothiazide (MICARDIS HCT) 40-12.5 MG tablet [id number] Yes Take 1 tablet by mouth once daily for blood pressure Pleas Koch, NP Taking Active             Patient Active Problem List   Diagnosis Date Noted   Other chest pain 04/21/2021   Shortness of breath 04/21/2021   Lightheadedness 04/21/2021   Other fatigue 04/21/2021   Nocturia more than twice per night 04/09/2021   Erectile dysfunction 04/09/2021   Skin lesions 07/03/2020   Chronic back pain 08/17/2019   Chronic shoulder pain 12/12/2018   Rectal bleeding 09/19/2018   Paroxysmal atrial fibrillation ([place]) 10/31/2017   Syncope 08/10/2017   Osteoarthritis 03/17/2017   Edema 07/21/2016   Cigarette smoker 11/17/2015   Diverticulosis 12/16/2014   Chronic obstructive pulmonary disease ([place]) 11/26/2014   Cervical pain (neck) 10/14/2014   Hyperlipidemia  07/11/2014   Preventative health care 07/11/2014   Pacemaker 06/03/2014   Anxiety and depression 06/03/2014   Alcohol abuse 06/03/2014   OSA (obstructive sleep apnea) 06/03/2014   Essential hypertension 06/03/2014   Tobacco dependence 06/03/2014    Immunization History  Administered Date(s) Administered   Fluad Quad(high Dose 65+) 12/11/2020    Influenza,inj,Quad PF,6+ Mos 11/15/2014, 11/05/2015, 12/23/2016, 12/06/2017, 12/12/2018, 01/11/2020   PNEUMOCOCCAL CONJUGATE-20 12/11/2020   Pneumococcal Polysaccharide-23 11/04/2014   Td 10/26/2011   Zoster, Live 11/18/2015    Conditions to be addressed/monitored:  {USCCMDZASSESSMENTOPTIONS:23563}  There are no care plans that you recently modified to display for this patient.    Medication Assistance: {MEDASSISTANCEINFO:25044}  Compliance/Adherence/Medication fill history: Care Gaps: ***  Star-Rating Drugs: ***  Patient's preferred pharmacy is:  [id number] - [place], [place] - [address]  [place] [address] Phone: [phone] Fax: [phone]  [facility] - [place], [place] - [address] [place] [address] Phone: [phone] Fax: [phone]  Uses pill box? {Yes or If no, why not?:20788} Pt endorses ***% compliance  We discussed: {Pharmacy options:24294} Patient decided to: {US Pharmacy Plan:23885}  Care Plan and Follow Up Patient Decision:  {FOLLOWUP:24991}  Plan: {CM FOLLOW UP PLAN:25073}  ***    Current Barriers:  Past due for follow up with specialist - cardiology and pulmonology  Pharmacist Clinical Goal(s):  Patient will contact provider office for questions/concerns as evidenced notation of same in electronic health record through collaboration with PharmD and provider.   Interventions: 1:1 collaboration with Clark, Katherine K, NP regarding development and update of comprehensive plan of care as evidenced by provider attestation and co-signature Inter-disciplinary care team collaboration (see longitudinal plan of care) Comprehensive medication review performed; medication list updated in electronic medical record  Hypertension (BP goal <140/90) -Controlled, per clinic readings  -Current treatment: Telmisartan-HCTZ 40-12.5 mg - 1 tablet daily - Appropriate, Effective, Safe,  Accessible Carvedilol 3.125 mg - 1 tablet twice daily - Appropriate, Effective, Safe, Accessible -Medications previously tried: lisinopril (cough)  -Current home readings: none reported -Contributing factors: tobacco use, OSA -Complications: AFIB -Current dietary habits: no recent changes to diet/lifestyle -Current exercise habits: none formal -Denies hypotensive/hypertensive symptoms; Patient plans to follow up with cardiology regarding October ED visit for chest pain. He has not has chest pain since and thinks it may have been anxiety related. -Counseled to monitor BP at home periodically, document, and provide log at future appointments -Recommended to continue current medication  Hyperlipidemia: (LDL goal < 100) -Controlled - LDL within goal -Current treatment: Atorvastatin 20 mg - 1 tablet daily - Appropriate, Effective, Safe, Accessible Zetia 10 mg - 1 tablet daily - Appropriate, Effective, Safe, Accessible -Medications previously tried: simvastatin  -Educated on Cholesterol goals;  -Recommended to continue current medication  COPD (Goal: control symptoms and prevent exacerbations) -Controlled, mild symptoms and no exacerbations in past year -Pt reports > 1 yr since last pulmonology, plans to schedule follow up -Last visit 06/19/19: Pulmonology - no ILD, positive for moderate emphysema with COPD, PFT normal maintained on Symbicort -Current treatment  Albuterol - 2 puffs every 6 hours PRN - Appropriate, Effective, Safe, Accessible Symbicort 160-4.5 mcg/act - 2 puffs twice daily - Query appropriate (LAMA/LABA preferred depending on patient history) -Medications previously tried: none reported   -Gold Grade: Gold 1 (FEV1>80%) -Current COPD Classification:  A (low sx, <2 exacerbations/yr) -MMRC/CAT score: none  -Pulmonary function testing: 06/19/19 FEV1 85% -Exacerbations requiring treatment in last 6 months:   none  -Patient reports consistent use of maintenance inhaler -Frequency  of rescue inhaler use: very rare -Denies SOB or recent albuterol use. -Counseled on Differences between maintenance and rescue inhalers -Pt denies any exacerbations this year. He is happy with current control. He is working on smoking cessation. Reviewed risks/benefits of inhaled corticosteroids in COPD. -Recommended to continue current medication; Follow up with pulmonology. Continue progress for smoking cessation.  Tobacco use (Goal Quit smoking) -Not ideally controlled - Pt started bupropion last Monday and plans to set quit within 2 weeks. We discussed tools for successful quit date. -Pt is motivated to quit smoking to improve his cough. We discussed this could worsen before it improves. -Previous quit attempts: multiple -Reports Lung cancer screening scheduled for May 2023 -Current treatment  Bupropion 150 mg 12 hour (started on Monday 01/19/21) - 1 tablet twice daily  -Discussed building an online quit plan - [email or web address] -Recommended to continue current medication  Patient Goals/Self-Care Activities Patient will:  - contact CCM team with any health concerns

## 2021-05-20 ENCOUNTER — Telehealth: Payer: Self-pay

## 2021-05-20 NOTE — Progress Notes (Signed)
? ? ?  Chronic Care Management ?Pharmacy Assistant  ? ?Name: Brandon Lopez  MRN: [id number] DOB: Apr 23, 1955 ? ?Reason for Encounter: CCM (Reschedule Appointment) ?  ?Patient has been rescheduled for a telephone visit with Charlene Brooke on 06/30/2021 at 3:45.  ? ?Charlene Brooke, CPP notified ? ?Marijean Niemann, RMA ?Clinical Pharmacy Assistant ?[phone] ? ? ?

## 2021-05-24 ENCOUNTER — Other Ambulatory Visit: Payer: Self-pay | Admitting: Cardiovascular Disease

## 2021-05-29 ENCOUNTER — Ambulatory Visit (INDEPENDENT_AMBULATORY_CARE_PROVIDER_SITE_OTHER): Payer: PPO | Admitting: Internal Medicine

## 2021-05-29 ENCOUNTER — Ambulatory Visit (INDEPENDENT_AMBULATORY_CARE_PROVIDER_SITE_OTHER): Payer: PPO

## 2021-05-29 ENCOUNTER — Encounter: Payer: Self-pay | Admitting: Internal Medicine

## 2021-05-29 ENCOUNTER — Encounter: Payer: PPO | Admitting: Internal Medicine

## 2021-05-29 VITALS — BP 126/70 | HR 74 | Ht 65.5 in | Wt 189.4 lb

## 2021-05-29 DIAGNOSIS — G4733 Obstructive sleep apnea (adult) (pediatric): Secondary | ICD-10-CM

## 2021-05-29 DIAGNOSIS — J441 Chronic obstructive pulmonary disease with (acute) exacerbation: Secondary | ICD-10-CM

## 2021-05-29 DIAGNOSIS — R0609 Other forms of dyspnea: Secondary | ICD-10-CM | POA: Diagnosis not present

## 2021-05-29 DIAGNOSIS — Z77090 Contact with and (suspected) exposure to asbestos: Secondary | ICD-10-CM | POA: Diagnosis not present

## 2021-05-29 DIAGNOSIS — F17210 Nicotine dependence, cigarettes, uncomplicated: Secondary | ICD-10-CM

## 2021-05-29 LAB — CBC WITH DIFFERENTIAL/PLATELET
Basophils Absolute: 0 10*3/uL (ref 0.0–0.1)
Basophils Relative: 0.5 % (ref 0.0–3.0)
Eosinophils Absolute: 0.1 10*3/uL (ref 0.0–0.7)
Eosinophils Relative: 1.8 % (ref 0.0–5.0)
HCT: 46.3 % (ref 39.0–52.0)
Hemoglobin: 15.9 g/dL (ref 13.0–17.0)
Lymphocytes Relative: 34.1 % (ref 12.0–46.0)
Lymphs Abs: 2.6 10*3/uL (ref 0.7–4.0)
MCHC: 34.4 g/dL (ref 30.0–36.0)
MCV: 94.6 fl (ref 78.0–100.0)
Monocytes Absolute: 1.2 10*3/uL — ABNORMAL HIGH (ref 0.1–1.0)
Monocytes Relative: 15.4 % — ABNORMAL HIGH (ref 3.0–12.0)
Neutro Abs: 3.6 10*3/uL (ref 1.4–7.7)
Neutrophils Relative %: 48.2 % (ref 43.0–77.0)
Platelets: 193 10*3/uL (ref 150.0–400.0)
RBC: 4.9 Mil/uL (ref 4.22–5.81)
RDW: 14 % (ref 11.5–15.5)
WBC: 7.5 10*3/uL (ref 4.0–10.5)

## 2021-05-29 LAB — BASIC METABOLIC PANEL
BUN: 19 mg/dL (ref 6–23)
CO2: 31 mEq/L (ref 19–32)
Calcium: 9.5 mg/dL (ref 8.4–10.5)
Chloride: 97 mEq/L (ref 96–112)
Creatinine, Ser: 1.11 mg/dL (ref 0.40–1.50)
GFR: 69.58 mL/min (ref >60.00)
Glucose, Bld: 107 mg/dL — ABNORMAL HIGH (ref 70–99)
Potassium: 3.8 mEq/L (ref 3.5–5.1)
Sodium: 135 mEq/L (ref 135–145)

## 2021-05-29 LAB — POC COVID19 BINAXNOW: SARS Coronavirus 2 Ag: NEGATIVE

## 2021-05-29 LAB — D-DIMER, QUANTITATIVE: D-Dimer, Quant: 0.33 mcg/mL FEU (ref <0.50)

## 2021-05-29 MED ORDER — IPRATROPIUM-ALBUTEROL 0.5-2.5 (3) MG/3ML IN SOLN: 3.0000 mL | Freq: Four times a day (QID) | RESPIRATORY_TRACT | 3 refills | Status: DC | PRN

## 2021-05-29 NOTE — Assessment & Plan Note (Signed)
Plan- surveillance. Now enrolled in annual low dose chest CT program, which can help. ?

## 2021-05-29 NOTE — Progress Notes (Signed)
05/30/22- 66 yoM former smoker (100 pkyrs, QS Jan2023)for sleep evaluation with concern of OSA and exacerbation of dyspnea. ?Medical problems include CHF/Pacemakert,, HTN, PAFib, COPD, HxTobacco Use, OSA, Diverticulosis, Osteoarthritis, ETOH, Anxiety/Depression, Hx Positive TB PPD,  ?NPSG [place] 09/10/15- AHI 44.3/ hr, desaturation to 83%, body weight 184 lbs, CPAP to 10 ?CPAP [employer]- not using ?Epworth score 2 ?Body weight today-189 lbs ?Arrival O2 sat 95% room air ?-Albuterol hfa, Symbicort 160,  ?Screening Chest CT program ?CXR 04/21/21-IMPRESSION: ?No active cardiopulmonary disease. ?FINDINGS: ?Cardiomediastinal silhouette unchanged in size and contour. No ?evidence of central vascular congestion. No interlobular septal ?thickening.  ?Low lung volumes persist. ?Coarsened interstitial markings.  No new confluent airspace disease. ?Right chest wall pacing device unchanged. ?Coarsened interstitial markings, with no new confluent airspace disease. ?No acute displaced fracture. Degenerative changes of the spine. ?1) OSA-after sleep study in 2017 he used CPAP for 6 months then quit because he felt more tired and less well rested after using CPAP.  He is specifically curious now about Inspire.  He is aware of daytime tiredness and implies he has been told of loud snoring.  ENT for tonsillectomy as child.  We discussed his original sleep study and medical implications.  Discussed treatment options and are making referral to ENT to discuss Inspire option.  He may be asked to update sleep study and to retry CPAP first. ?2) COPD-acute exacerbation in the last 5 days, started with diffuse myalgias and malaise but no recognized fever, chills or purulent sputum.  Increased dyspnea on exertion without cough.  After arrival here he asked to go out to his truck to get his rescue inhaler which gave significant relief.  He had COVID infection once a year or so ago and tested negative for COVID infection earlier this week.  Visited  girlfriend recently and she is now sick with similar illness. 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  He is enrolled in the low-dose chest CT screening program. ? ?Prior to Admission medications   ?Medication Sig Start Date End Date Taking? Authorizing Provider  ?albuterol (VENTOLIN HFA) 108 (90 Base) MCG/ACT inhaler INHALE 2 PUFFS BY MOUTH EVERY 6 HOURS AS NEEDED FOR WHEEZING AND FOR SHORTNESS OF BREATH 05/26/20  Yes Lesleigh Noe, MD  ?aspirin EC 81 MG tablet Take 81 mg by mouth daily. Swallow whole.   Yes [provider]  ?atorvastatin (LIPITOR) 20 MG tablet Take 1 tablet (20 mg total) by mouth daily. For cholesterol. 03/29/21  Yes Pleas Koch, NP  ?carvedilol (COREG) 3.125 MG tablet Take 1 tablet by mouth twice daily 03/05/21  Yes Deboraha Sprang, MD  ?ezetimibe (ZETIA) 10 MG tablet Take 1 tablet by mouth once daily 05/25/21  Yes Gollan, Kathlene November, MD  ?ipratropium-albuterol (DUONEB) 0.5-2.5 (3) MG/3ML SOLN Take 3 mLs by nebulization every 6 (six) hours as needed. 05/29/21  Yes Shephanie Romas, Tarri Fuller D, MD  ?loratadine (CLARITIN) 10 MG tablet Take 10 mg by mouth daily as needed for allergies.    Yes [provider]  ?meloxicam (MOBIC) 15 MG tablet Take 0.5-1 tablets (7.5-15 mg total) by mouth daily as needed for pain. 05/21/20  Yes Hilts, Legrand Como, MD  ?Dellis Anes 160-4.5 MCG/ACT inhaler Inhale 2 puffs into the lungs 2 (two) times daily. 10/02/20  Yes Pleas Koch, NP  ?tadalafil (CIALIS) 5 MG tablet Take 1 tablet (5 mg total) by mouth daily. For nocturia and ED. 04/09/21  Yes Pleas Koch, NP  ?tamsulosin (FLOMAX) 0.4 MG CAPS capsule Take 1 capsule (0.4 mg total) by mouth every evening. For urine flow 05/07/21  Yes Pleas Koch, NP  ?telmisartan-hydrochlorothiazide (MICARDIS HCT) 40-12.5 MG tablet Take 1 tablet by mouth once daily for blood pressure 03/23/21  Yes Pleas Koch, NP  ?buPROPion  [facility] SR) 150 MG 12 hr tablet Take 1 tablet (150 mg total) by mouth 2 (two) times daily. For smoking cessation ?Patient not taking: Reported on 05/29/2021 05/08/21   Pleas Koch, NP  ? ?Past Medical History:  ?Diagnosis Date  ? Actinic keratosis   ? Arthritis   ? Chronic systolic CHF (congestive heart failure) ([place])   ? a. echo 6/16: EF 55-60%, Gr1DD, LA nl, PASP nl; b. echo 7/19: EF 40-45%, unable to exclude RWMA, Gr1DD, LA nl, RVSF nl, PASP nl  ? COPD (chronic obstructive pulmonary disease) ([facility])   ? Depression   ? Diverticulosis   ? Found during colonoscopy  ? Hypertension   ? Kidney stones   ? PAF (paroxysmal atrial fibrillation) ([place])   ? a. short episodes previously noted on PPM interrogation; b. started on Xarelto 07/2017 given further episodes of Afib noted during interrogation; c. CHADS2VASc at least 3 (CHF, HTN, vascualr disease with aortic calcifications noted on CT)  ? 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 Presence of permanent cardiac pacemaker   ? a. Biotronik; Dayna Barker DR-T [id number]; placed 08/2012  ? Sleep apnea   ? Symptomatic bradycardia   ? a. s/p Biotronik; Dayna Barker DR-T [id number]; placed 08/2012 at [facility]  ? ?Past Surgical History:  ?Procedure Laterality Date  ? APPENDECTOMY  age 50  ? CARDIAC CATHETERIZATION    ? 7 years ago  ? COLONOSCOPY N/A 05/27/2014  ? Procedure: COLONOSCOPY;  Surgeon: Hulen Luster, MD;  [facility] ENDOSCOPY;  Service: Gastroenterology;  Laterality: N/A;  ? INSERT / REPLACE / REMOVE PACEMAKER    ? 3 years ago  ? ?Family History  ?Problem Relation Age of Onset  ? Arthritis Mother   ? Heart disease Father   ? Hyperlipidemia Father   ? Hypertension Father   ? Cancer Paternal Grandfather   ? ?Social History  ? ?Socioeconomic History  ? Marital status: Single  ?  Spouse name: Not on file  ? Number of children: Not on file  ? Years of education: Not on file  ? Highest education level: Not on file  ?Occupational History  ? Not on file  ?Tobacco Use  ? Smoking status: Former  ?  Packs/day: 2.00   ?  Years: 50.00  ?  Pack years: 100.00  ?  Types: Cigarettes  ?  Start date: 1964  ?  Quit date: 02/01/2021  ?  Years since quitting: 0.3  ? Smokeless tobacco: Never  ? Tobacco comments:  ?  started smoking at age 56, 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  ?Vaping Use  ? Vaping Use: Never used  ?Substance and Sexual Activity  ? Alcohol use: Yes  ?  Alcohol/week: 8.0 - 14.0 standard drinks  ?  Types: 8 - 14 Cans of beer per week  ? Drug use: Yes  ?  Types: Marijuana  ? Sexual activity: Not on file  ?Other Topics Concern  ? Not on file  ?Social History Narrative  ? Not on file  ? ?Social Determinants of Health  ? ?Financial Resource Strain: Low Risk   ? Difficulty of Paying Living Expenses: Not very hard  ?Food Insecurity: No Food  Insecurity  ? Worried About Charity fundraiser in the Last Year: Never true  ? Ran Out of Food in the Last Year: Never true  ?Transportation Needs: No Transportation Needs  ? Lack of Transportation (Medical): No  ? Lack of Transportation (Non-Medical): No  ?Physical Activity: Inactive  ? Days of Exercise per Week: 0 days  ? Minutes of Exercise per Session: 0 min  ?Stress: Stress Concern Present  ? Feeling of Stress : Very much  ?Social Connections: Not on file  ?Intimate Partner Violence: Not At Risk  ? Fear of Current or Ex-Partner: No  ? Emotionally Abused: No  ? Physically Abused: No  ? Sexually Abused: No  ? ?ROS-see HPI   + = positive ?Constitutional:    weight loss, night sweats, fevers, chills, fatigue, lassitude. ?HEENT:    headaches, difficulty swallowing, tooth/dental problems, sore throat,  ?     sneezing, itching, ear ache, nasal congestion, post nasal drip, snoring ?CV:    chest pain, orthopnea, PND, swelling in lower extremities, anasarca,                                  dizziness, palpitations ?Resp:   +shortness of breath with exertion or at rest.   ?             productive cough,   non-productive cough, coughing up of blood.   ?           change in color of mucus.  wheezing.    ?Skin:    rash or lesions. ?GI:  +heartburn, indigestion, abdominal pain, nausea, vomiting, diarrhea,  ?               change in bowel habits, +loss of appetite ?GU: dysuria, change in color of urine, no urgenc

## 2021-05-29 NOTE — Assessment & Plan Note (Signed)
After recent contact he reports girlfriend has similar symptoms, so this is likely viral. COVID test negative here today. He felt much better after using rescue inhaler. ?Plan- Symptom management. CXR. Labs for CBC, BMET, D-dimer, BNP. Ordering home nebulizer/ Duoneb. ?

## 2021-05-29 NOTE — Assessment & Plan Note (Signed)
He was not happy with CPAP and turned machine in after 6 months. ? and is not likely to be an oral appliance candidate. ?Plan- we will refer to ENT to learn about OAP. Consider re-testing and re-trial of CPAP as options as well. ?

## 2021-05-29 NOTE — Patient Instructions (Signed)
Order- CXR  dx COPD exacerbation ? ?Order- labs- CBC w diff, BMET, BNP, D-dimer    dx dyspnea on exertion ? ?Order- Covid test today ? ?Order- DME  new compressor nebulizer   Dx COPD mixed type ?                     Duoneb neb solution every 6 hours if needed ? ?Order- referral to ENT Dr Jonette Eva  consider Inspire for OSA ?

## 2021-05-29 NOTE — Assessment & Plan Note (Signed)
He quit in Jan, 2023 and strongly advised he stay off. ?

## 2021-05-30 LAB — PRO B NATRIURETIC PEPTIDE: NT-Pro BNP: <36 pg/mL (ref 0–376)

## 2021-06-01 ENCOUNTER — Telehealth: Payer: Self-pay | Admitting: Internal Medicine

## 2021-06-01 ENCOUNTER — Encounter: Payer: Self-pay | Admitting: Internal Medicine

## 2021-06-01 DIAGNOSIS — J441 Chronic obstructive pulmonary disease with (acute) exacerbation: Secondary | ICD-10-CM | POA: Diagnosis not present

## 2021-06-01 NOTE — Telephone Encounter (Signed)
Called and spoke patient who states that he got it figured out and was able to pick up the medication. Nothing further needed at this time. ?

## 2021-06-02 ENCOUNTER — Ambulatory Visit (INDEPENDENT_AMBULATORY_CARE_PROVIDER_SITE_OTHER): Payer: PPO | Admitting: Primary Care

## 2021-06-02 ENCOUNTER — Encounter: Payer: Self-pay | Admitting: Primary Care

## 2021-06-02 DIAGNOSIS — R3912 Poor urinary stream: Secondary | ICD-10-CM | POA: Diagnosis not present

## 2021-06-02 DIAGNOSIS — R351 Nocturia: Secondary | ICD-10-CM

## 2021-06-02 NOTE — Assessment & Plan Note (Signed)
Improved. ? ?Continue tadalafil 5 mg and tamsulosin 0.4 mg nightly. ?PSA is UTD. ? ?Following with pulmonology for CPAP.  ? ?Continue to monitor.  ?

## 2021-06-02 NOTE — Patient Instructions (Signed)
Resume your tamsulosin 0.4 mg for weak urinary stream. ?Continue tadalafil for urinary frequency at night. ? ?It was a pleasure to see you today! ? ?

## 2021-06-02 NOTE — Telephone Encounter (Signed)
Jun 01, 2021 ?Me ?  ?   5:25 PM ?Note ?Called and spoke patient who states that he got it figured out and was able to pick up the medication. Nothing further needed at this time.  ?  ? ?

## 2021-06-02 NOTE — Progress Notes (Signed)
? ?Subjective:  ? ? Patient ID: Brandon Lopez, male    DOB: 1955-06-21, 66 y.o.   MRN: [id number] ? ?HPI ? ?Brandon Lopez is a very pleasant 66 y.o. male with a history of hypertension, paroxysmal atrial fibrillation, OSA, COPD, anxiety depression, alcohol abuse, tobacco dependence, nocturia, hyperlipidemia, pacemaker in place, osteoarthritis who presents today for follow up of nocturia and weak urinary stream.  ? ?Currently managed on tamsulosin 0.4 mg daily for nocturia and weak urinary stream, this was initiated in late March 2023.  Also managed on tadalafil 5 mg daily for erectile dysfunction and nocturia. ? ?Today he endorses that he has not taken his tamsulosin 0.4 mg in the last 4-5 days given recent URI symptoms.  When he was taking tamsulosin daily he did notice improvement in his urinary stream. He is compliant to his tadalafil 5 mg daily and has noticed improvement in nocturia.  ? ?He plans on resuming his tamsulosin today. PSA was 0.26 in November 2022. ? ?Review of Systems  ?Respiratory:  Negative for shortness of breath.   ?Cardiovascular:  Negative for chest pain.  ?Genitourinary:  Negative for hematuria.  ?     Urinary stream improved with tamsulosin. ?Nocturia improved with tadalafil.   ? ?   ? ? ?Past Medical History:  ?Diagnosis Date  ? Actinic keratosis   ? Arthritis   ? Chronic systolic CHF (congestive heart failure) ([place])   ? a. echo 6/16: EF 55-60%, Gr1DD, LA nl, PASP nl; b. echo 7/19: EF 40-45%, unable to exclude RWMA, Gr1DD, LA nl, RVSF nl, PASP nl  ? COPD (chronic obstructive pulmonary disease) ([facility])   ? Depression   ? Diverticulosis   ? Found during colonoscopy  ? Hypertension   ? Kidney stones   ? PAF (paroxysmal atrial fibrillation) ([place])   ? a. short episodes previously noted on PPM interrogation; b. started on Xarelto 07/2017 given further episodes of Afib noted during interrogation; c. CHADS2VASc at least 3 (CHF, HTN, vascualr disease with aortic calcifications noted on  CT)  ? 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 Presence of permanent cardiac pacemaker   ? a. Biotronik; Dayna Barker DR-T [id number]; placed 08/2012  ? Sleep apnea   ? Symptomatic bradycardia   ? a. s/p Biotronik; Dayna Barker DR-T [id number]; placed 08/2012 at [facility]  ? ? ?Social History  ? ?Socioeconomic History  ? Marital status: Single  ?  Spouse name: Not on file  ? Number of children: Not on file  ? Years of education: Not on file  ? Highest education level: Not on file  ?Occupational History  ? Not on file  ?Tobacco Use  ? Smoking status: Former  ?  Packs/day: 2.00  ?  Years: 50.00  ?  Pack years: 100.00  ?  Types: Cigarettes  ?  Start date: 1964  ?  Quit date: 02/01/2021  ?  Years since quitting: 0.3  ? Smokeless tobacco: Never  ? Tobacco comments:  ?  started smoking at age 17, 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  ?Vaping Use  ? Vaping Use: Never used  ?Substance and Sexual Activity  ? Alcohol use: Yes  ?  Alcohol/week: 8.0 - 14.0 standard drinks  ?  Types: 8 - 14 Cans of beer per week  ? Drug use: Yes  ?  Types: Marijuana  ? Sexual activity: Not on file  ?Other Topics Concern  ? Not on file  ?Social History Narrative  ? Not on file  ? ?Social Determinants of Health  ? ?  Financial Resource Strain: Low Risk   ? Difficulty of Paying Living Expenses: Not very hard  ?Food Insecurity: No Food Insecurity  ? Worried About Charity fundraiser in the Last Year: Never true  ? Ran Out of Food in the Last Year: Never true  ?Transportation Needs: No Transportation Needs  ? Lack of Transportation (Medical): No  ? Lack of Transportation (Non-Medical): No  ?Physical Activity: Inactive  ? Days of Exercise per Week: 0 days  ? Minutes of Exercise per Session: 0 min  ?Stress: Stress Concern Present  ? Feeling of Stress : Very much  ?Social Connections: Not on file  ?Intimate Partner Violence: Not At Risk  ? Fear of Current or Ex-Partner: No  ? Emotionally Abused: No  ? Physically Abused: No  ? Sexually Abused: No  ? ? ?Past Surgical History:  ?Procedure Laterality Date  ?  APPENDECTOMY  age 8  ? CARDIAC CATHETERIZATION    ? 7 years ago  ? COLONOSCOPY N/A 05/27/2014  ? Procedure: COLONOSCOPY;  Surgeon: Hulen Luster, MD;  Location: [facility] ENDOSCOPY;  Service: Gastroenterology;  Laterality: N/A;  ? INSERT / REPLACE / REMOVE PACEMAKER    ? 3 years ago  ? ? ?Family History  ?Problem Relation Age of Onset  ? Arthritis Mother   ? Heart disease Father   ? Hyperlipidemia Father   ? Hypertension Father   ? Cancer Paternal Grandfather   ? ? ?Allergies  ?Allergen Reactions  ? Simvastatin Diarrhea  ? Lisinopril Cough  ? ? ?Current Outpatient Medications on File Prior to Visit  ?Medication Sig Dispense Refill  ? albuterol (VENTOLIN HFA) 108 (90 Base) MCG/ACT inhaler INHALE 2 PUFFS BY MOUTH EVERY 6 HOURS AS NEEDED FOR WHEEZING AND FOR SHORTNESS OF BREATH 18 g 0  ? aspirin EC 81 MG tablet Take 81 mg by mouth daily. Swallow whole.    ? atorvastatin (LIPITOR) 20 MG tablet Take 1 tablet (20 mg total) by mouth daily. For cholesterol. 90 tablet 2  ? carvedilol (COREG) 3.125 MG tablet Take 1 tablet by mouth twice daily 180 tablet 0  ? ezetimibe (ZETIA) 10 MG tablet Take 1 tablet by mouth once daily 90 tablet 2  ? ipratropium-albuterol (DUONEB) 0.5-2.5 (3) MG/3ML SOLN Take 3 mLs by nebulization every 6 (six) hours as needed. 360 mL 3  ? loratadine (CLARITIN) 10 MG tablet Take 10 mg by mouth daily as needed for allergies.     ? meloxicam (MOBIC) 15 MG tablet Take 0.5-1 tablets (7.5-15 mg total) by mouth daily as needed for pain. 30 tablet 6  ? SYMBICORT 160-4.5 MCG/ACT inhaler Inhale 2 puffs into the lungs 2 (two) times daily. 33 g 0  ? tadalafil (CIALIS) 5 MG tablet Take 1 tablet (5 mg total) by mouth daily. For nocturia and ED. 90 tablet 0  ? tamsulosin (FLOMAX) 0.4 MG CAPS capsule Take 1 capsule (0.4 mg total) by mouth every evening. For urine flow 30 capsule 0  ? telmisartan-hydrochlorothiazide (MICARDIS HCT) 40-12.5 MG tablet Take 1 tablet by mouth once daily for blood pressure 90 tablet 2  ? buPROPion  (WELLBUTRIN SR) 150 MG 12 hr tablet Take 1 tablet (150 mg total) by mouth 2 (two) times daily. For smoking cessation (Patient not taking: Reported on 05/29/2021) 180 tablet 0  ? ?No current facility-administered medications on file prior to visit.  ? ? ?BP 118/74   Pulse 97   Temp 98.1 ?F (36.7 ?C) (Oral)   Ht 5' 5.5" (1.664 m)  Wt 190 lb (86.2 kg)   SpO2 97%   BMI 31.14 kg/m?  ?Objective:  ? Physical Exam ?Cardiovascular:  ?   Rate and Rhythm: Normal rate and regular rhythm.  ?Pulmonary:  ?   Effort: Pulmonary effort is normal.  ?   Breath sounds: Normal breath sounds. No wheezing or rales.  ?Musculoskeletal:  ?   Cervical back: Neck supple.  ?Skin: ?   General: Skin is warm and dry.  ?Neurological:  ?   Mental Status: He is alert and oriented to person, place, and time.  ? ? ? ? ? ?   ?Assessment & Plan:  ? ? ? ? ?This visit occurred during the SARS-CoV-2 public health emergency.  Safety protocols were in place, including screening questions prior to the visit, additional usage of staff PPE, and extensive cleaning of exam room while observing appropriate contact time as indicated for disinfecting solutions.  ?

## 2021-06-02 NOTE — Assessment & Plan Note (Signed)
Improved. ? ?Continue tamsulosin 0.4 mg.  ?Continue tadalafil 5 mg daily. ?

## 2021-06-09 ENCOUNTER — Encounter: Payer: Self-pay | Admitting: Internal Medicine

## 2021-06-09 ENCOUNTER — Ambulatory Visit (INDEPENDENT_AMBULATORY_CARE_PROVIDER_SITE_OTHER): Payer: PPO

## 2021-06-09 DIAGNOSIS — I495 Sick sinus syndrome: Secondary | ICD-10-CM | POA: Diagnosis not present

## 2021-06-09 LAB — CUP PACEART REMOTE DEVICE CHECK
Date Time Interrogation Session: 20230516094400
Implantable Lead Implant Date: 20140801
Implantable Lead Implant Date: 20140801
Implantable Lead Location: 753859
Implantable Lead Location: 753860
Implantable Lead Model: 362
Implantable Lead Serial Number: 29356730
Implantable Lead Serial Number: 29392661
Implantable Pulse Generator Implant Date: 20140801
Pulse Gen Serial Number: 68053398

## 2021-06-12 ENCOUNTER — Ambulatory Visit: Payer: PPO

## 2021-06-15 ENCOUNTER — Encounter (HOSPITAL_COMMUNITY): Payer: Self-pay | Admitting: Emergency Medicine

## 2021-06-15 ENCOUNTER — Emergency Department (HOSPITAL_COMMUNITY): Payer: PPO

## 2021-06-15 ENCOUNTER — Other Ambulatory Visit: Payer: Self-pay

## 2021-06-15 ENCOUNTER — Emergency Department (HOSPITAL_COMMUNITY)
Admission: EM | Admit: 2021-06-15 | Discharge: 2021-06-15 | Disposition: A | Discharge status: Home / Self Care | Payer: PPO | Attending: Emergency Medicine | Admitting: Emergency Medicine

## 2021-06-15 DIAGNOSIS — Z7982 Long term (current) use of aspirin: Secondary | ICD-10-CM | POA: Diagnosis not present

## 2021-06-15 DIAGNOSIS — Z7951 Long term (current) use of inhaled steroids: Secondary | ICD-10-CM | POA: Diagnosis not present

## 2021-06-15 DIAGNOSIS — J449 Chronic obstructive pulmonary disease, unspecified: Secondary | ICD-10-CM | POA: Diagnosis not present

## 2021-06-15 DIAGNOSIS — M189 Osteoarthritis of first carpometacarpal joint, unspecified: Secondary | ICD-10-CM | POA: Diagnosis not present

## 2021-06-15 DIAGNOSIS — I110 Hypertensive heart disease with heart failure: Secondary | ICD-10-CM | POA: Insufficient documentation

## 2021-06-15 DIAGNOSIS — W270XXA Contact with workbench tool, initial encounter: Secondary | ICD-10-CM | POA: Insufficient documentation

## 2021-06-15 DIAGNOSIS — I509 Heart failure, unspecified: Secondary | ICD-10-CM | POA: Insufficient documentation

## 2021-06-15 DIAGNOSIS — S61214A Laceration without foreign body of right ring finger without damage to nail, initial encounter: Secondary | ICD-10-CM | POA: Diagnosis not present

## 2021-06-15 DIAGNOSIS — Z23 Encounter for immunization: Secondary | ICD-10-CM | POA: Diagnosis not present

## 2021-06-15 DIAGNOSIS — S6991XA Unspecified injury of right wrist, hand and finger(s), initial encounter: Secondary | ICD-10-CM | POA: Diagnosis present

## 2021-06-15 DIAGNOSIS — S61411A Laceration without foreign body of right hand, initial encounter: Secondary | ICD-10-CM | POA: Diagnosis not present

## 2021-06-15 MED ORDER — LIDOCAINE HCL (PF) 1 % IJ SOLN
30.0000 mL | Freq: Once | INTRAMUSCULAR | Status: AC
  Administered 2021-06-15: 30 mL
  Filled 2021-06-15: qty 30

## 2021-06-15 MED ORDER — HYDROCODONE-ACETAMINOPHEN 5-325 MG PO TABS
1.0000 | ORAL_TABLET | Freq: Once | ORAL | Status: AC
  Administered 2021-06-15: 1 via ORAL
  Filled 2021-06-15: qty 1

## 2021-06-15 MED ORDER — TETANUS-DIPHTH-ACELL PERTUSSIS 5-2.5-18.5 LF-MCG/0.5 IM SUSY
0.5000 mL | PREFILLED_SYRINGE | Freq: Once | INTRAMUSCULAR | Status: AC
  Administered 2021-06-15: 0.5 mL via INTRAMUSCULAR
  Filled 2021-06-15: qty 0.5

## 2021-06-15 MED ORDER — CEPHALEXIN 500 MG PO CAPS: 500.0000 mg | ORAL_CAPSULE | Freq: Four times a day (QID) | ORAL | 0 refills | Status: AC

## 2021-06-15 MED ORDER — ACETAMINOPHEN 325 MG PO TABS
650.0000 mg | ORAL_TABLET | Freq: Once | ORAL | Status: AC
Start: 2021-06-15 — End: 2021-06-15
  Administered 2021-06-15: 650 mg via ORAL
  Filled 2021-06-15: qty 2

## 2021-06-15 MED ORDER — OXYCODONE HCL 5 MG PO TABS: 5.0000 mg | ORAL_TABLET | Freq: Four times a day (QID) | ORAL | 0 refills | Status: AC | PRN

## 2021-06-15 NOTE — Discharge Instructions (Addendum)
Start Keflex, 1 capsule 4 times a day for 5 days. Alternate Tylenol and Motrin every 3-4 hours as needed for pain. You can also take oxycodone, 1 tablet every 6 hours as needed for severe/breakthrough pain. Please follow-up with your primary care doctor in 10 days for a wound recheck and likely suture removal.  You have 6 sutures that need to be removed (they are blue in color) in about 10-14 days. Keep the splint on for about 5-7 days.  Then gently cleanse the wound with mild soap and water, do not scrub, then cover the wound.

## 2021-06-15 NOTE — ED Provider Triage Note (Signed)
Emergency Medicine Provider Triage Evaluation Note  Brandon Lopez , a 66 y.o. male  was evaluated in triage.  Pt complains of dominant right hand laceration.  He was cutting some wood when the saw kicked back and lacerated his right fourth digit.  Unsure of last tetanus.  Denies any numbness.  Review of Systems  Positive: Laceration to right fourth digit Negative: Numbness  Physical Exam  BP 104/78 (BP Location: Left Arm)   Pulse 69   Temp 97.8 F (36.6 C)   Resp 16   SpO2 98%  Gen:   Awake, no distress   Resp:  Normal effort  MSK:   Moves extremities without difficulty  Other:  Laceration noted to right fourth digit near the base of the finger with what appears to be exposed tendon.  Tendon does appear intact although I have not thoroughly explored the wound.  2+ radial pulse palpated.  Bleeding noted.  Patient is moving digits but does report pain  Medical Decision Making  Medically screening exam initiated at 2:03 PM.  Appropriate orders placed.  Brandon Lopez was informed that the remainder of the evaluation will be completed by another provider, this initial triage assessment does not replace that evaluation, and the importance of remaining in the ED until their evaluation is complete.  Will need wound closure, imaging and pain control. Will room   Brandon Lopez, [place] 06/15/21 1405

## 2021-06-15 NOTE — ED Triage Notes (Signed)
Patient coming from home, complaint of right hand injury. Patient was cutting some wood, kicked back and tore skin off the top of his right hand. Unknow when last tdap was. Vss. NAD.

## 2021-06-15 NOTE — ED Provider Notes (Signed)
[facility] EMERGENCY DEPARTMENT Provider Note   CSN: [id number] Arrival date & time: 06/15/21  1315     History  Chief Complaint  Patient presents with   Hand Injury    right    Brandon Lopez is a 66 y.o. male with a history of CHF, COPD, HTN, atrial fibrillation (not on anticoagulation) presenting to the ED with a laceration to the right ring finger.  Patient states that he was using a table saw to cut wood when he accidentally slipped causing a large laceration to his right ring finger.  He denies any numbness or tingling over the finger.  Has full range of motion but is greatly limited due to pain.  Also endorses some pain over the right middle and right pinky fingers.   Hand Injury     Home Medications Prior to Admission medications   Medication Sig Start Date End Date Taking? Authorizing Provider  cephALEXin (KEFLEX) 500 MG capsule Take 1 capsule (500 mg total) by mouth 4 (four) times daily for 5 days. 06/15/21 06/20/21 Yes Sondra Come, MD  oxyCODONE (ROXICODONE) 5 MG immediate release tablet Take 1 tablet (5 mg total) by mouth every 6 (six) hours as needed for up to 3 days for severe pain. 06/15/21 06/18/21 Yes Sondra Come, MD  albuterol (VENTOLIN HFA) 108 (90 Base) MCG/ACT inhaler INHALE 2 PUFFS BY MOUTH EVERY 6 HOURS AS NEEDED FOR WHEEZING AND FOR SHORTNESS OF BREATH 05/26/20   Lesleigh Noe, MD  aspirin EC 81 MG tablet Take 81 mg by mouth daily. Swallow whole.    [provider]  atorvastatin (LIPITOR) 20 MG tablet Take 1 tablet (20 mg total) by mouth daily. For cholesterol. 03/29/21   Pleas Koch, NP  buPROPion (WELLBUTRIN SR) 150 MG 12 hr tablet Take 1 tablet (150 mg total) by mouth 2 (two) times daily. For smoking cessation Patient not taking: Reported on 05/29/2021 05/08/21   Pleas Koch, NP  carvedilol (COREG) 3.125 MG tablet Take 1 tablet by mouth twice daily 03/05/21   Deboraha Sprang, MD  ezetimibe (ZETIA) 10 MG tablet  Take 1 tablet by mouth once daily 05/25/21   Minna Merritts, MD  ipratropium-albuterol (DUONEB) 0.5-2.5 (3) MG/3ML SOLN Take 3 mLs by nebulization every 6 (six) hours as needed. 05/29/21   Deneise Lever, MD  loratadine (CLARITIN) 10 MG tablet Take 10 mg by mouth daily as needed for allergies.     [provider]  meloxicam (MOBIC) 15 MG tablet Take 0.5-1 tablets (7.5-15 mg total) by mouth daily as needed for pain. 05/21/20   Hilts, Legrand Como, MD  SYMBICORT 160-4.5 MCG/ACT inhaler Inhale 2 puffs into the lungs 2 (two) times daily. 10/02/20   Pleas Koch, NP  tadalafil (CIALIS) 5 MG tablet Take 1 tablet (5 mg total) by mouth daily. For nocturia and ED. 04/09/21   Pleas Koch, NP  tamsulosin (FLOMAX) 0.4 MG CAPS capsule Take 1 capsule (0.4 mg total) by mouth every evening. For urine flow 05/07/21   Pleas Koch, NP  telmisartan-hydrochlorothiazide (MICARDIS HCT) 40-12.5 MG tablet Take 1 tablet by mouth once daily for blood pressure 03/23/21   Pleas Koch, NP      Allergies    Simvastatin and Lisinopril    Review of Systems   Review of Systems  Skin:  Positive for wound.   Physical Exam Updated Vital Signs BP 118/88 (BP Location: Left Arm)   Pulse 86   Temp  98.4 F (36.9 C) (Oral)   Resp 16   SpO2 98%  Physical Exam Constitutional:      Appearance: He is not toxic-appearing or diaphoretic.     Comments: In obvious pain.  HENT:     Head: Normocephalic and atraumatic.     Nose: Nose normal.  Cardiovascular:     Rate and Rhythm: Normal rate and regular rhythm.     Pulses: Normal pulses.  Pulmonary:     Effort: Pulmonary effort is normal. No respiratory distress.  Musculoskeletal:     Cervical back: Neck supple.     Comments: Initially, patient does have slightly decreased flexion of the right ring finger due to pain.  Extension is fully intact.  After numbing the finger, patient has full flexion and extension of all 5 fingers of the right hand.  Abduction  and adduction of the right ring finger is also intact.  Skin:    Comments: Along the lateral aspect of the proximal phalanx of the right ring finger, patient has a 4 cm laceration.  At the base of the laceration, there is visible tendon but it appears undamaged. He has a superficial skin tear over the dorsal aspect of the right middle finger.  Neurological:     General: No focal deficit present.     Mental Status: He is alert and oriented to person, place, and time.     Comments: Sensation is intact in all fingers of the right hand.    ED Results / Procedures / Treatments   Labs (all labs ordered are listed, but only abnormal results are displayed) Labs Reviewed - No data to display  EKG None  Radiology DG Hand Complete Right  Result Date: 06/15/2021 CLINICAL DATA:  Right hand laceration EXAM: RIGHT HAND - COMPLETE 3+ VIEW COMPARISON:  None Available. FINDINGS: No acute fracture or dislocation. No aggressive osseous lesion. Normal alignment. Severe osteoarthritis of the first [facility] joint. Mild osteoarthritis of the first IP joint. Mild joint space narrowing of the second MCP joint. Moderate joint space narrowing of the third MCP joint. Soft tissue are unremarkable. No radiopaque foreign body or soft tissue emphysema. IMPRESSION: 1. No acute osseous injury of the right hand. Electronically Signed   By: Kathreen Devoid M.D.   On: 06/15/2021 14:30    Procedures .Marland KitchenLaceration Repair  Date/Time: 06/15/2021 4:00 PM Performed by: Sondra Come, MD Authorized by: Elnora Morrison, MD   Consent:    Consent obtained:  Verbal   Consent given by:  Patient   Risks, benefits, and alternatives were discussed: yes     Risks discussed:  Infection, pain, need for additional repair, retained foreign body and poor cosmetic result   Alternatives discussed:  No treatment Anesthesia:    Anesthesia method:  Local infiltration   Local anesthetic:  Lidocaine 1% WITH epi Laceration details:    Location:   Finger   Finger location:  R ring finger   Length (cm):  4   Depth (mm):  1 Pre-procedure details:    Preparation:  Patient was prepped and draped in usual sterile fashion and imaging obtained to evaluate for foreign bodies Exploration:    Limited defect created (wound extended): no     Hemostasis achieved with:  Direct pressure   Imaging obtained: x-ray     Imaging outcome: foreign body not noted     Wound exploration: wound explored through full range of motion and entire depth of wound visualized     Contaminated: no  Treatment:    Area cleansed with:  Saline   Amount of cleaning:  Standard   Irrigation solution:  Sterile saline   Irrigation volume:  1L   Irrigation method:  Syringe   Debridement:  None   Undermining:  None Skin repair:    Repair method:  Sutures   Suture size:  4-0   Suture material:  Prolene (3 sutures to the distal portion of the wound applied with fast gut)   Suture technique:  Simple interrupted   Number of sutures:  6 Approximation:    Approximation:  Close Repair type:    Repair type:  Intermediate Post-procedure details:    Dressing:  Non-adherent dressing and splint for protection   Procedure completion:  Tolerated    Medications Ordered in ED Medications  lidocaine (PF) (XYLOCAINE) 1 % injection 30 mL (30 mLs Infiltration Given 06/15/21 1525)  Tdap (BOOSTRIX) injection 0.5 mL (0.5 mLs Intramuscular Given 06/15/21 1525)  acetaminophen (TYLENOL) tablet 650 mg (650 mg Oral Given 06/15/21 1522)  HYDROcodone-acetaminophen (NORCO/VICODIN) 5-325 MG per tablet 1 tablet (1 tablet Oral Given 06/15/21 1522)    ED Course/ Medical Decision Making/ A&P                           Medical Decision Making Amount and/or Complexity of Data Reviewed Radiology: ordered and independent interpretation performed. Decision-making details documented in ED Course.  Risk Prescription drug management.   Chanc Kervin is a 66 y.o. male with a history of CHF,  COPD, HTN, atrial fibrillation (not on anticoagulation) presenting to the ED with a laceration to the right ring finger.  On exam, patient does have a 4 cm laceration to the lateral aspect of his right ring finger down to tendon.  He has full range of motion (flexion, extension, abduction, and abduction) of all fingers of the left hand.  No concern for tendon involvement within the laceration.  Sensation is intact throughout the finger.  X-ray of the right hand was obtained which did not show any underlying fractures or retained foreign body.  Laceration was repaired as per the procedure note above.  Patient was instructed to follow-up with his PCP in 10-14 days for removal of the 6 Prolene sutures.  Finger was splinted and dressing applied for protection.  Wound care instructions provided both verbally and in writing.  Patient was also given a prescription for Keflex x7 days and oxycodone for breakthrough pain.  Strict return precautions were discussed and the patient was discharged home in stable condition.        Final Clinical Impression(s) / ED Diagnoses Final diagnoses:  Laceration of right ring finger without foreign body without damage to nail, initial encounter    Rx / DC Orders ED Discharge Orders          Ordered    cephALEXin (KEFLEX) 500 MG capsule  4 times daily        06/15/21 1703    oxyCODONE (ROXICODONE) 5 MG immediate release tablet  Every 6 hours PRN        06/15/21 1703              Sondra Come, MD 06/15/21 1932    Elnora Morrison, MD 06/15/21 2336

## 2021-06-15 NOTE — ED Notes (Signed)
Pt verbalized understanding of d/c instructions, meds, and followup care. Denies questions. VSS, no distress noted. Steady gait to exit with all belongings.  ?

## 2021-06-16 ENCOUNTER — Telehealth: Payer: Self-pay

## 2021-06-16 NOTE — Telephone Encounter (Signed)
Agree with precautions given to pt  Agree with nurse assessment in plan.  Thank you for speaking with them. 

## 2021-06-16 NOTE — Telephone Encounter (Signed)
Thank you :)

## 2021-06-16 NOTE — Telephone Encounter (Signed)
Pt was already scheduled with T Dugal FNP for 06/18/21 at 10:20. I spoke with pt and he said he feels dizzy and sometimes like going to pass out on and off but has not actually passed out.  Pt has a scan first thing in the morning but changed his appt to 06/17/21 at 10:20 with ED precautions and pt voiced understanding. Pt appreciative of call. Sending note to T Dugal FNP, Claiborne Billings CMA and FYI to Allie Bossier NP as PCP.

## 2021-06-16 NOTE — Telephone Encounter (Signed)
[place] [facility] - Client TELEPHONE ADVICE RECORD [facility] Patient Name: Brandon Lopez Gender: Male DOB: 10/09/1955 Age: 66 Y 75 M 6 D Return Phone Number: [phone] (Primary) Address: City/ State/ Zip: [place]   [address] Client [place] [facility] - Client Client Site [place] - Day Provider [name] - NP Contact Type Call Who Is Calling Patient / Member / Family / Caregiver Call Type Triage / Clinical Relationship To Patient Self Return Phone Number [phone] (Primary) Chief Complaint Blood Pressure Low Reason for Call Symptomatic / Request for [place] states he is concerned his BP is low. Current reading of BP was 91/58. States the past few week it has been running low states areound 100/60. Translation No Nurse Assessment Nurse: Dawna Part, RN, Jarrett Soho Date/Time (Eastern Time): 06/16/2021 1:59:40 PM Confirm and document reason for call. If symptomatic, describe symptoms. ---Caller reports having low blood pressure. Having fatigue, felt like he would pass out at times. blood pressure 1:00 91/58 Current blood pressure 115/74 Does the patient have any new or worsening symptoms? ---Yes Will a triage be completed? ---Yes Related visit to physician within the last 2 weeks? ---No Does the PT have any chronic conditions? (i.e. diabetes, asthma, this includes High risk factors for pregnancy, etc.) ---Yes List chronic conditions. ---Pacemaker, COPD, Is this a behavioral health or substance abuse call? ---No Guidelines Guideline Title Affirmed Question Affirmed Notes Nurse Date/Time (Eastern Time) Blood Pressure - Low [0] Systolic BP 07-121 AND [9] taking blood pressure medications AND [3] NOT dizzy, lightheaded or weak Dawna Part, RN, Jarrett Soho 06/16/2021 2:03:39 PM Disp. Time Eilene Ghazi Time) Disposition Final User 06/16/2021 1:48:43 PM Attempt made -  message left Riddle, RN, Jarrett Soho PLEASE NOTE: All timestamps contained within this report are represented as [place] Standard Time. CONFIDENTIALTY NOTICE: This fax transmission is intended only for the addressee. It contains information that is legally privileged, confidential or otherwise protected from use or disclosure. If you are not the intended recipient, you are strictly prohibited from reviewing, disclosing, copying using or disseminating any of this information or taking any action in reliance on or regarding this information. If you have received this fax in error, please notify us immediately by telephone so that we can arrange for its return to [place]. Phone: [phone], Toll-Free: [phone], Fax: [phone] Page: 2 of 2 Call Id: [id number] 06/16/2021 2:08:40 PM See PCP within 24 Hours Yes Riddle, RN, Hayes Ludwig Disagree/Comply Comply Caller Understands Yes PreDisposition Call Doctor Care Advice Given Per Guideline SEE PCP WITHIN 24 HOURS: * IF OFFICE WILL BE OPEN: You need to be examined within the next 24 hours. Call your doctor (or NP/PA) when the office opens and make an appointment. CARE ADVICE given per Low Blood Pressure (Adult) guideline. CALL BACK IF: * Lightheadedness, weakness, or dizziness occurs * Systolic BP under 90 * You feel sick * You become worse Comments User: Tedd Sias, RN Date/Time (Eastern Time): 06/16/2021 2:04:39 PM Current blood pressure 108/77 Referrals REFERRED TO PCP OFFIC

## 2021-06-17 ENCOUNTER — Ambulatory Visit (INDEPENDENT_AMBULATORY_CARE_PROVIDER_SITE_OTHER): Payer: PPO | Admitting: Family

## 2021-06-17 ENCOUNTER — Encounter: Payer: Self-pay | Admitting: Family

## 2021-06-17 ENCOUNTER — Ambulatory Visit
Admission: RE | Admit: 2021-06-17 | Discharge: 2021-06-17 | Disposition: A | Discharge status: Home / Self Care | Payer: PPO | Source: Ambulatory Visit | Attending: Primary Care | Admitting: Primary Care

## 2021-06-17 VITALS — BP 100/56 | HR 71 | Temp 97.7°F | Resp 16 | Ht 65.5 in | Wt 192.2 lb

## 2021-06-17 DIAGNOSIS — I952 Hypotension due to drugs: Secondary | ICD-10-CM | POA: Insufficient documentation

## 2021-06-17 DIAGNOSIS — R351 Nocturia: Secondary | ICD-10-CM | POA: Diagnosis not present

## 2021-06-17 DIAGNOSIS — Z87891 Personal history of nicotine dependence: Secondary | ICD-10-CM | POA: Diagnosis not present

## 2021-06-17 DIAGNOSIS — R739 Hyperglycemia, unspecified: Secondary | ICD-10-CM | POA: Insufficient documentation

## 2021-06-17 DIAGNOSIS — D72828 Other elevated white blood cell count: Secondary | ICD-10-CM | POA: Insufficient documentation

## 2021-06-17 DIAGNOSIS — S61314A Laceration without foreign body of right ring finger with damage to nail, initial encounter: Secondary | ICD-10-CM | POA: Insufficient documentation

## 2021-06-17 DIAGNOSIS — F17210 Nicotine dependence, cigarettes, uncomplicated: Secondary | ICD-10-CM | POA: Insufficient documentation

## 2021-06-17 DIAGNOSIS — R5383 Other fatigue: Secondary | ICD-10-CM

## 2021-06-17 DIAGNOSIS — I10 Essential (primary) hypertension: Secondary | ICD-10-CM

## 2021-06-17 DIAGNOSIS — R0609 Other forms of dyspnea: Secondary | ICD-10-CM

## 2021-06-17 HISTORY — DX: Hypotension due to drugs: I95.2

## 2021-06-17 HISTORY — DX: Other elevated white blood cell count: D72.828

## 2021-06-17 LAB — URINALYSIS, ROUTINE W REFLEX MICROSCOPIC
Bilirubin Urine: NEGATIVE
Hgb urine dipstick: NEGATIVE
Ketones, ur: NEGATIVE
Leukocytes,Ua: NEGATIVE
Nitrite: NEGATIVE
RBC / HPF: NONE SEEN (ref 0–?)
Specific Gravity, Urine: 1.01 (ref 1.000–1.030)
Total Protein, Urine: NEGATIVE
Urine Glucose: NEGATIVE
Urobilinogen, UA: 0.2 (ref 0.0–1.0)
pH: 6.5 (ref 5.0–8.0)

## 2021-06-17 LAB — BASIC METABOLIC PANEL
BUN: 12 mg/dL (ref 6–23)
CO2: 28 mEq/L (ref 19–32)
Calcium: 9.2 mg/dL (ref 8.4–10.5)
Chloride: 100 mEq/L (ref 96–112)
Creatinine, Ser: 0.98 mg/dL (ref 0.40–1.50)
GFR: 80.77 mL/min (ref >60.00)
Glucose, Bld: 89 mg/dL (ref 70–99)
Potassium: 3.9 mEq/L (ref 3.5–5.1)
Sodium: 138 mEq/L (ref 135–145)

## 2021-06-17 LAB — CBC WITH DIFFERENTIAL/PLATELET
Basophils Absolute: 0 10*3/uL (ref 0.0–0.1)
Basophils Relative: 0.4 % (ref 0.0–3.0)
Eosinophils Absolute: 0.1 10*3/uL (ref 0.0–0.7)
Eosinophils Relative: 1.7 % (ref 0.0–5.0)
HCT: 43.2 % (ref 39.0–52.0)
Hemoglobin: 14.7 g/dL (ref 13.0–17.0)
Lymphocytes Relative: 34.9 % (ref 12.0–46.0)
Lymphs Abs: 2.3 10*3/uL (ref 0.7–4.0)
MCHC: 34.1 g/dL (ref 30.0–36.0)
MCV: 95.1 fl (ref 78.0–100.0)
Monocytes Absolute: 0.8 10*3/uL (ref 0.1–1.0)
Monocytes Relative: 12.4 % — ABNORMAL HIGH (ref 3.0–12.0)
Neutro Abs: 3.4 10*3/uL (ref 1.4–7.7)
Neutrophils Relative %: 50.6 % (ref 43.0–77.0)
Platelets: 246 10*3/uL (ref 150.0–400.0)
RBC: 4.55 Mil/uL (ref 4.22–5.81)
RDW: 13.3 % (ref 11.5–15.5)
WBC: 6.7 10*3/uL (ref 4.0–10.5)

## 2021-06-17 LAB — TSH: TSH: 2.67 u[IU]/mL (ref 0.35–5.50)

## 2021-06-17 LAB — B12 AND FOLATE PANEL
Folate: 4.6 ng/mL — ABNORMAL LOW (ref >5.9)
Vitamin B-12: 148 pg/mL — ABNORMAL LOW (ref 211–911)

## 2021-06-17 MED ORDER — TELMISARTAN 20 MG PO TABS: 20.0000 mg | ORAL_TABLET | Freq: Every day | ORAL | 0 refills | Status: DC

## 2021-06-17 NOTE — Assessment & Plan Note (Signed)
Repeat cbc pending results 

## 2021-06-17 NOTE — Assessment & Plan Note (Signed)
Going to reduce telmisartan dose to see if any improvement in blood pressure

## 2021-06-17 NOTE — Assessment & Plan Note (Signed)
Will keep wrapped and splint in place as down to tendon F/u 6/2 for removal of sutures  monitor daily for drainage, s/s infection

## 2021-06-17 NOTE — Telephone Encounter (Signed)
Discussed with Lawerance Bach, NP. Appreciate Tabitha's evaluation.

## 2021-06-17 NOTE — Assessment & Plan Note (Signed)
a1c ordered pending results Work on diabetic diet exercise as tolerated 

## 2021-06-17 NOTE — Progress Notes (Signed)
Established Patient Office Visit  Subjective:  Patient ID: Brandon Lopez, male    DOB: [birth year]  Age: 66 y.o. MRN: [id number]  CC:  Chief Complaint  Patient presents with   Blood Pressure Check    HPI Brandon Lopez is here today with concerns.   Went to ER two days ago 5/22 as had laceration at home while building drawers for some cabinets, with injury to right ring finger. Had a 4 cm laceration to latreal aspect of right ring finger down to the tendon. Xray of right hand did not show underlying fracture , pt is to f/u with pcp 10-14 days for removal of 6 prolene sutures. Finger splinted and dressing applied for protection during the ER. Was given keflex x 7 days and oxycodone for breakthrough pain.   Pt has noticed low blood pressure over the last two weeks.  Feeling dizzy at times, with fatigue, and feels like about to pass out at times.  When I walked into office today pt was nodding up but easily aroused and responding appropriately to commands. He is not usually fatigued so this worries him.   Blood pressure was 100/56 today in office.  Did just have a CT of his chest this am.  Lab Results  Component Value Date   WBC 7.5 05/29/2021   HGB 15.9 05/29/2021   HCT 46.3 05/29/2021   MCV 94.6 05/29/2021   PLT 193.0 05/29/2021   Lab Results  Component Value Date   IRON 167 (H) 04/21/2021   TIBC 355.6 04/21/2021   FERRITIN 215.6 04/21/2021         Past Medical History:  Diagnosis Date   Actinic keratosis    Arthritis    Chronic systolic CHF (congestive heart failure) ([place])    a. echo 6/16: EF 55-60%, Gr1DD, LA nl, PASP nl; b. echo 7/19: EF 40-45%, unable to exclude RWMA, Gr1DD, LA nl, RVSF nl, PASP nl   COPD (chronic obstructive pulmonary disease) (H[facility])    Depression    Diverticulosis    Found during colonoscopy   Hypertension    Kidney stones    PAF (paroxysmal atrial fibrillation) ([place])    a. short episodes previously noted on PPM  interrogation; b. started on Xarelto 07/2017 given further episodes of Afib noted during interrogation; c. 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 Biotronik; Dayna Barker DR-T [id number]; placed 08/2012   Sleep apnea    Symptomatic bradycardia    a. s/p Biotronik; Dayna Barker DR-T [id number]; placed 08/2012 at [facility]    Past Surgical History:  Procedure Laterality Date   APPENDECTOMY  age 16   CARDIAC CATHETERIZATION     7 years ago   COLONOSCOPY N/A 05/27/2014   Procedure: COLONOSCOPY;  Surgeon: Hulen Luster, MD;  Location: [facility] ENDOSCOPY;  Service: Gastroenterology;  Laterality: N/A;   INSERT / REPLACE / REMOVE PACEMAKER     3 years ago    Family History  Problem Relation Age of Onset   Arthritis Mother    Heart disease Father    Hyperlipidemia Father    Hypertension Father    Cancer Paternal Grandfather     Social History   Socioeconomic History   Marital status: Single    Spouse name: Not on file   Number of children: Not on file   Years of education: Not on file  Highest education level: Not on file  Occupational History   Not on file  Tobacco Use   Smoking status: Former    Packs/day: 2.00    Years: 50.00    Pack years: 100.00    Types: Cigarettes    Start date: 1964    Quit date: 02/01/2021    Years since quitting: 0.3   Smokeless tobacco: Never   Tobacco comments:    started smoking at age 63, 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  Vaping Use   Vaping Use: Never used  Substance and Sexual Activity   Alcohol use: Yes    Alcohol/week: 8.0 - 14.0 standard drinks    Types: 8 - 14 Cans of beer per week   Drug use: Yes    Types: Marijuana   Sexual activity: Not on file  Other Topics Concern   Not on file  Social History Narrative   Not on file   Social Determinants of Health   Financial Resource Strain: Low Risk    Difficulty of Paying Living Expenses: Not very  hard  Food Insecurity: No Food Insecurity   Worried About Running Out of Food in the Last Year: Never true   Ran Out of Food in the Last Year: Never true  Transportation Needs: No Transportation Needs   Lack of Transportation (Medical): No   Lack of Transportation (Non-Medical): No  Physical Activity: Inactive   Days of Exercise per Week: 0 days   Minutes of Exercise per Session: 0 min  Stress: Stress Concern Present   Feeling of Stress : Very much  Social Connections: Not on file  Intimate Partner Violence: Not At Risk   Fear of Current or Ex-Partner: No   Emotionally Abused: No   Physically Abused: No   Sexually Abused: No    Outpatient Medications Prior to Visit  Medication Sig Dispense Refill   albuterol (VENTOLIN HFA) 108 (90 Base) MCG/ACT inhaler INHALE 2 PUFFS BY MOUTH EVERY 6 HOURS AS NEEDED FOR WHEEZING AND FOR SHORTNESS OF BREATH 18 g 0   aspirin EC 81 MG tablet Take 81 mg by mouth daily. Swallow whole.     atorvastatin (LIPITOR) 20 MG tablet Take 1 tablet (20 mg total) by mouth daily. For cholesterol. 90 tablet 2   carvedilol (COREG) 3.125 MG tablet Take 1 tablet by mouth twice daily 180 tablet 0   cephALEXin (KEFLEX) 500 MG capsule Take 1 capsule (500 mg total) by mouth 4 (four) times daily for 5 days. 20 capsule 0   ezetimibe (ZETIA) 10 MG tablet Take 1 tablet by mouth once daily 90 tablet 2   ipratropium-albuterol (DUONEB) 0.5-2.5 (3) MG/3ML SOLN Take 3 mLs by nebulization every 6 (six) hours as needed. 360 mL 3   loratadine (CLARITIN) 10 MG tablet Take 10 mg by mouth daily as needed for allergies.      meloxicam (MOBIC) 15 MG tablet Take 0.5-1 tablets (7.5-15 mg total) by mouth daily as needed for pain. 30 tablet 6   oxyCODONE (ROXICODONE) 5 MG immediate release tablet Take 1 tablet (5 mg total) by mouth every 6 (six) hours as needed for up to 3 days for severe pain. 12 tablet 0   SYMBICORT 160-4.5 MCG/ACT inhaler Inhale 2 puffs into the lungs 2 (two) times daily. 33 g  0   tadalafil (CIALIS) 5 MG tablet Take 1 tablet (5 mg total) by mouth daily. For nocturia and ED. 90 tablet 0   tamsulosin (FLOMAX) 0.4 MG CAPS capsule Take 1 capsule (0.4 mg  total) by mouth every evening. For urine flow 30 capsule 0   telmisartan-hydrochlorothiazide (MICARDIS HCT) 40-12.5 MG tablet Take 1 tablet by mouth once daily for blood pressure 90 tablet 2   buPROPion (WELLBUTRIN SR) 150 MG 12 hr tablet Take 1 tablet (150 mg total) by mouth 2 (two) times daily. For smoking cessation (Patient not taking: Reported on 06/17/2021) 180 tablet 0   No facility-administered medications prior to visit.    Allergies  Allergen Reactions   Simvastatin Diarrhea   Lisinopril Cough    ROS Review of Systems  Constitutional:  Positive for fatigue (overly tired). Negative for activity change, appetite change, chills, fever and unexpected weight change.  HENT:  Negative for congestion, postnasal drip, sinus pressure and sinus pain.   Respiratory:  Negative for cough, shortness of breath and wheezing.   Cardiovascular:  Negative for chest pain, palpitations and leg swelling.  Gastrointestinal:  Negative for abdominal pain, constipation and diarrhea.  Genitourinary:  Positive for frequency (nocturia). Negative for decreased urine volume, difficulty urinating, dysuria and urgency.  Musculoskeletal:  Negative for arthralgias.  Skin:  Positive for wound (wrapped up laceration right ring finger). Negative for rash.  Neurological:  Positive for dizziness and light-headedness. Negative for tremors, syncope, facial asymmetry, weakness and headaches.  Psychiatric/Behavioral:  Negative for self-injury, sleep disturbance and suicidal ideas. The patient is not nervous/anxious.      Objective:    Physical Exam Constitutional:      General: He is not in acute distress.    Appearance: Normal appearance. He is obese. He is not ill-appearing, toxic-appearing or diaphoretic.  HENT:     Head: Normocephalic.      Right Ear: Tympanic membrane normal.     Left Ear: Tympanic membrane normal.     Nose: Nose normal.     Mouth/Throat:     Mouth: Mucous membranes are moist.  Eyes:     Pupils: Pupils are equal, round, and reactive to light.  Cardiovascular:     Rate and Rhythm: Normal rate and regular rhythm.  Pulmonary:     Effort: Pulmonary effort is normal.     Breath sounds: Normal breath sounds. No wheezing or rales.  Abdominal:     General: Abdomen is flat.  Neurological:     General: No focal deficit present.     Mental Status: He is oriented to person, place, and time. Mental status is at baseline. He is lethargic.     Cranial Nerves: Cranial nerves 2-12 are intact. No cranial nerve deficit or facial asymmetry.     Coordination: Coordination is intact. Romberg sign negative.     Gait: Gait is intact.     Comments: But easily arousable  Answering questions appropirately   Psychiatric:        Mood and Affect: Mood normal.        Behavior: Behavior normal.        Thought Content: Thought content normal.        Judgment: Judgment normal.        BP (!) 100/56   Pulse 71   Temp 97.7 F (36.5 C)   Resp 16   Ht 5' 5.5" (1.664 m)   Wt 192 lb 3 oz (87.2 kg)   SpO2 97%   BMI 31.50 kg/m  Wt Readings from Last 3 Encounters:  06/17/21 192 lb 3 oz (87.2 kg)  06/17/21 180 lb (81.6 kg)  06/02/21 190 lb (86.2 kg)     Health Maintenance Due  Topic  Date Due   Zoster Vaccines- Shingrix (1 of 2) Never done    There are no preventive care reminders to display for this patient.  Lab Results  Component Value Date   TSH 3.19 04/21/2021   Lab Results  Component Value Date   WBC 7.5 05/29/2021   HGB 15.9 05/29/2021   HCT 46.3 05/29/2021   MCV 94.6 05/29/2021   PLT 193.0 05/29/2021   Lab Results  Component Value Date   NA 135 05/29/2021   K 3.8 05/29/2021   CO2 31 05/29/2021   GLUCOSE 107 (H) 05/29/2021   BUN 19 05/29/2021   CREATININE 1.11 05/29/2021   BILITOT 0.7 04/21/2021    ALKPHOS 73 04/21/2021   AST 17 04/21/2021   ALT 24 04/21/2021   PROT 7.0 04/21/2021   ALBUMIN 4.4 04/21/2021   CALCIUM 9.5 05/29/2021   ANIONGAP 10 11/11/2020   GFR 69.58 05/29/2021   Lab Results  Component Value Date   HGBA1C 5.8 04/21/2021      Assessment & Plan:   Problem List Items Addressed This Visit       Cardiovascular and Mediastinum   Essential hypertension    Stop telmisartan HCTZ, start telimisartan 20 mg only monitor blood pressure twice daily See if fatigue improves  Also advised pt to call cardiologist for f/u on symptoms as well to r/o cardiac etiologies (pt with pacer)        Relevant Medications   telmisartan (MICARDIS) 20 MG tablet   Hypotension due to drugs    Going to reduce telmisartan dose to see if any improvement in blood pressure       Relevant Medications   telmisartan (MICARDIS) 20 MG tablet     Musculoskeletal and Integument   Laceration of right ring finger with damage to nail without foreign body    Will keep wrapped and splint in place as down to tendon F/u 6/2 for removal of sutures  monitor daily for drainage, s/s infection          Other   Other fatigue   Relevant Orders   CBC with Differential   Basic metabolic panel   TSH   V95 and Folate Panel   High blood monocyte count - Primary    Repeat cbc pending results       Hyperglycemia    a1c ordered pending results Work on diabetic diet exercise as tolerated       Relevant Orders   Basic metabolic panel   Nocturia   Relevant Orders   Urine Culture   Urinalysis, Routine w reflex microscopic   DOE (dyspnea on exertion)    Had CT chest today pending results        Meds ordered this encounter  Medications   telmisartan (MICARDIS) 20 MG tablet    Sig: Take 1 tablet (20 mg total) by mouth daily.    Dispense:  30 tablet    Refill:  0    Order Specific Question:   Supervising Provider    Answer:   Diona Browner, AMY E [[id number]]    Follow-up: Return in 9 days  (on 06/26/2021) for suture removal with Gentry Fitz, his pcp .    Eugenia Pancoast, FNP

## 2021-06-17 NOTE — Patient Instructions (Signed)
D/c your telmisartan HCTZ and start telmisartan 20 mg once daily.  Monitor blood pressure, keep log.  Please call cardiologist to advise them of hypotension and increasing fatigue.   Stop by the lab prior to leaving today. I will notify you of your results once received.   Due to recent changes in healthcare laws, you may see results of your imaging and/or laboratory studies on MyChart before I have had a chance to review them.  I understand that in some cases there may be results that are confusing or concerning to you. Please understand that not all results are received at the same time and often I may need to interpret multiple results in order to provide you with the best plan of care or course of treatment. Therefore, I ask that you please give me 2 business days to thoroughly review all your results before contacting my office for clarification. Should we see a critical lab result, you will be contacted sooner.   It was a pleasure seeing you today! Please do not hesitate to reach out with any questions and or concerns.  Regards,   Eugenia Pancoast FNP-C

## 2021-06-17 NOTE — Assessment & Plan Note (Signed)
Stop telmisartan HCTZ, start telimisartan 20 mg only monitor blood pressure twice daily See if fatigue improves  Also advised pt to call cardiologist for f/u on symptoms as well to r/o cardiac etiologies (pt with pacer)

## 2021-06-17 NOTE — Assessment & Plan Note (Signed)
Had CT chest today pending results

## 2021-06-18 ENCOUNTER — Ambulatory Visit: Payer: PPO | Admitting: Family

## 2021-06-18 ENCOUNTER — Other Ambulatory Visit: Payer: Self-pay | Admitting: Family

## 2021-06-18 DIAGNOSIS — E538 Deficiency of other specified B group vitamins: Secondary | ICD-10-CM

## 2021-06-18 DIAGNOSIS — D528 Other folate deficiency anemias: Secondary | ICD-10-CM

## 2021-06-18 LAB — URINE CULTURE
MICRO NUMBER:: 13439818
Result:: NO GROWTH
SPECIMEN QUALITY:: ADEQUATE

## 2021-06-18 MED ORDER — FOLIC ACID 1 MG PO TABS: 1.0000 mg | ORAL_TABLET | Freq: Every day | ORAL | 1 refills | Status: DC

## 2021-06-18 NOTE — Progress Notes (Signed)
Pending urine culture  Vitamin B12 very low, please set up for B12 injections, 1000 mcg IM once monthly for three months, also schedule 3 month f/u appt to repeat labs and f/u in office. Recommend also oral otc 1000 mcg once daily vitamin B12  Folate low Start folate 1 mg once daily   This can cause increased fatigue and tiredness   [name], [place])

## 2021-06-19 ENCOUNTER — Other Ambulatory Visit: Payer: Self-pay

## 2021-06-19 DIAGNOSIS — Z122 Encounter for screening for malignant neoplasm of respiratory organs: Secondary | ICD-10-CM

## 2021-06-19 DIAGNOSIS — Z87891 Personal history of nicotine dependence: Secondary | ICD-10-CM

## 2021-06-24 ENCOUNTER — Ambulatory Visit (INDEPENDENT_AMBULATORY_CARE_PROVIDER_SITE_OTHER): Payer: PPO

## 2021-06-24 DIAGNOSIS — E538 Deficiency of other specified B group vitamins: Secondary | ICD-10-CM

## 2021-06-24 MED ORDER — CYANOCOBALAMIN 1000 MCG/ML IJ SOLN
1000.0000 ug | Freq: Once | INTRAMUSCULAR | Status: AC
  Administered 2021-06-24: 1000 ug via INTRAMUSCULAR

## 2021-06-24 NOTE — Progress Notes (Signed)
Per orders of Allie Bossier, AGNP-C, injection of B-12 given by Barkley Bruns in right deltoid. Patient tolerated injection well. Patient will make appointment for 1 month.

## 2021-06-24 NOTE — Progress Notes (Signed)
Remote pacemaker transmission.   

## 2021-06-26 ENCOUNTER — Ambulatory Visit (INDEPENDENT_AMBULATORY_CARE_PROVIDER_SITE_OTHER): Payer: PPO | Admitting: Nurse Practitioner

## 2021-06-26 ENCOUNTER — Telehealth: Payer: Self-pay

## 2021-06-26 ENCOUNTER — Encounter: Payer: Self-pay | Admitting: Nurse Practitioner

## 2021-06-26 VITALS — BP 130/76 | HR 80 | Temp 97.3°F | Resp 16 | Ht 65.5 in | Wt 194.0 lb

## 2021-06-26 DIAGNOSIS — Z4802 Encounter for removal of sutures: Secondary | ICD-10-CM

## 2021-06-26 NOTE — Progress Notes (Signed)
   Acute Office Visit  Subjective:     Patient ID: Brandon Lopez, male    DOB: 03/10/1955, 66 y.o.   MRN: [id number]  Chief Complaint  Patient presents with   Suture / Staple Removal    Follow up right ring finger    HPI Patient is in today for suture removal  He was seen in the ED on 06/15/2021 with a 4 laceratoin to the right fourth finger posteriorly.  Patient had 6 sutures placed and finger immobilized.  Patient is here today to have the sutures removed.  Review of Systems  Constitutional:  Negative for chills and fever.  Musculoskeletal:  Negative for joint pain.       Sutures to right fourth finger proximally       Objective:    BP 130/76   Pulse 80   Temp (!) 97.3 F (36.3 C)   Resp 16   Ht 5' 5.5" (1.664 m)   Wt 194 lb (88 kg)   SpO2 98%   BMI 31.79 kg/m    Physical Exam Vitals and nursing note reviewed.  Constitutional:      Appearance: Normal appearance.  Musculoskeletal:     Comments: 6 stitches removed from the right fourth finger proximally.  Patient does have limited range of motion to the fourth finger likely secondary due to the injury and still some edema present.  Neurological:     General: No focal deficit present.     Mental Status: He is alert.     No results found for any visits on 06/26/21.      Assessment & Plan:   Problem List Items Addressed This Visit       Other   Encounter for removal of sutures - Primary    We will consent obtained patient's hand placed on the table and irrigated with sterile irrigation solution.  Sutures were removed with a suture removal kit.  Patient tolerated procedure fairly well did have some pain and discomfort with moderate bleeding that was able to be stopped with direct pressure.  All 6 sutures were able to be removed.  Patient does have a little limited range of motion to the right fourth ring finger secondary to edema and inflammation still.  Patient will continue working slowly with range  of motion of his does not improve over the next week he will reach back out to the office for further evaluation        No orders of the defined types were placed in this encounter.   No follow-ups on file.  Romilda Garret, NP

## 2021-06-26 NOTE — Assessment & Plan Note (Signed)
We will consent obtained patient's hand placed on the table and irrigated with sterile irrigation solution.  Sutures were removed with a suture removal kit.  Patient tolerated procedure fairly well did have some pain and discomfort with moderate bleeding that was able to be stopped with direct pressure.  All 6 sutures were able to be removed.  Patient does have a little limited range of motion to the right fourth ring finger secondary to edema and inflammation still.  Patient will continue working slowly with range of motion of his does not improve over the next week he will reach back out to the office for further evaluation

## 2021-06-26 NOTE — Chronic Care Management (AMB) (Signed)
    Chronic Care Management Pharmacy Assistant   Name: Brandon Lopez  MRN: [id number] DOB: September 21, 1955   Reason for Encounter: Reminder Call   Conditions to be addressed/monitored: HTN, HLD, and COPD   Medications: Outpatient Encounter Medications as of 06/26/2021  Medication Sig   albuterol (VENTOLIN HFA) 108 (90 Base) MCG/ACT inhaler INHALE 2 PUFFS BY MOUTH EVERY 6 HOURS AS NEEDED FOR WHEEZING AND FOR SHORTNESS OF BREATH   aspirin EC 81 MG tablet Take 81 mg by mouth daily. Swallow whole.   atorvastatin (LIPITOR) 20 MG tablet Take 1 tablet (20 mg total) by mouth daily. For cholesterol.   carvedilol (COREG) 3.125 MG tablet Take 1 tablet by mouth twice daily   ezetimibe (ZETIA) 10 MG tablet Take 1 tablet by mouth once daily   folic acid (FOLVITE) 1 MG tablet Take 1 tablet (1 mg total) by mouth daily.   ipratropium-albuterol (DUONEB) 0.5-2.5 (3) MG/3ML SOLN Take 3 mLs by nebulization every 6 (six) hours as needed.   loratadine (CLARITIN) 10 MG tablet Take 10 mg by mouth daily as needed for allergies.    meloxicam (MOBIC) 15 MG tablet Take 0.5-1 tablets (7.5-15 mg total) by mouth daily as needed for pain.   SYMBICORT 160-4.5 MCG/ACT inhaler Inhale 2 puffs into the lungs 2 (two) times daily.   tadalafil (CIALIS) 5 MG tablet Take 1 tablet (5 mg total) by mouth daily. For nocturia and ED.   tamsulosin (FLOMAX) 0.4 MG CAPS capsule Take 1 capsule (0.4 mg total) by mouth every evening. For urine flow   telmisartan (MICARDIS) 20 MG tablet Take 1 tablet (20 mg total) by mouth daily.   No facility-administered encounter medications on file as of 06/26/2021.   Brandon Lopez was contacted to remind of upcoming telephone visit with Charlene Brooke on 06/30/21 at 3:45. Patient was reminded to have any blood glucose and blood pressure readings available for review at appointment.   Message was left reminding patient of appointment.    CCM referral has been placed prior to visit?  Yes     Star Rating Drugs: Medication:  Last Fill: Day Supply Telmisartan '20mg'$  05/28/21  30 Atorvastatin '20mg'$  03/30/21  [place], CPP notified  [name], [place]  [phone]

## 2021-06-26 NOTE — Patient Instructions (Signed)
Nice to see you today If you are going to be working keep it covered. You can just use plain soap and water to clean it. As the swelling goes down continue working on the range of motion if it does not improve let me know

## 2021-06-30 ENCOUNTER — Other Ambulatory Visit: Payer: Self-pay | Admitting: Pharmacist

## 2021-06-30 ENCOUNTER — Ambulatory Visit (INDEPENDENT_AMBULATORY_CARE_PROVIDER_SITE_OTHER): Payer: PPO | Admitting: Pharmacist

## 2021-06-30 DIAGNOSIS — R3912 Poor urinary stream: Secondary | ICD-10-CM

## 2021-06-30 DIAGNOSIS — I480 Paroxysmal atrial fibrillation: Secondary | ICD-10-CM

## 2021-06-30 DIAGNOSIS — E785 Hyperlipidemia, unspecified: Secondary | ICD-10-CM

## 2021-06-30 DIAGNOSIS — I10 Essential (primary) hypertension: Secondary | ICD-10-CM

## 2021-06-30 DIAGNOSIS — J449 Chronic obstructive pulmonary disease, unspecified: Secondary | ICD-10-CM

## 2021-06-30 DIAGNOSIS — R351 Nocturia: Secondary | ICD-10-CM

## 2021-06-30 NOTE — Patient Instructions (Signed)
Visit Information  Phone number for Pharmacist: [phone]   Goals Addressed   None     Care Plan : [place]  Updates made by Charlton Haws, RPH since 06/30/2021 12:00 AM     Problem: Hypertension, Hyperlipidemia, Atrial Fibrillation, COPD, and Weak urinary flow   Priority: High     Long-Range Goal: Disease mgmt   Start Date: 01/27/2021  Expected End Date: 07/01/2022  This Visit's Progress: On track  Recent Progress: On track  Priority: High  Note:   Current Barriers:  Unable to maintain control of urinary flow issues  Pharmacist Clinical Goal(s):  Patient will adhere to plan to optimize therapeutic regimen for urinary issues as evidenced by report of adherence to recommended medication management changes through collaboration with PharmD and provider.   Interventions: 1:1 collaboration with Pleas Koch, NP regarding development and update of comprehensive plan of care as evidenced by provider attestation and co-signature Inter-disciplinary care team collaboration (see longitudinal plan of care) Comprehensive medication review performed; medication list updated in electronic medical record   Hypertension (BP goal <140/90) -Controlled - BP at goal at home, not too low after switching to telmisartan (plain) -Current home readings: 118/75 -Hx OSA; did not tolerate CPAP; referred to ENT for [facility] consult -Current treatment: Telmisartan 20 mg daily- Appropriate, Effective, Safe, Accessible Carvedilol 3.125 mg BID- Appropriate, Effective, Safe, Accessible -Medications previously tried: lisinopril (cough), HCTZ -Current dietary habits: no recent changes to diet/lifestyle -Current exercise habits: none formal; he lives in an apartment with community center and has not gone to the gym yet, he is planning to check it out soon -Counseled to monitor BP at home periodically -Recommended to continue current medication; encouraged exercise to improve fatigue  as well   Hyperlipidemia: (LDL goal < 70) -Controlled - LDL 72 (11/2020), unable to tolerate higher doses of atorvastatin -Aortic atherosclerosis on CT -Current treatment: Atorvastatin 20 mg daily - Appropriate, Effective, Safe, Accessible Ezetimibe 10 mg daily - Appropriate, Effective, Safe, Accessible Aspirin 81 mg daily - Appropriate, Effective, Safe, Accessible -Medications previously tried: simvastatin  -Educated on Cholesterol goals;  -Recommended to continue current medication   Atrial Fibrillation (Goal: prevent stroke and major bleeding) -Controlled -Follows with Dr Rockey Situ, Dr Caryl Comes; original Afib episode < 12 hrs, per pacemaker Afib burden is low; he is off anticoagulation -CHADSVASC: 4 -Current treatment: Carvedilol 3.125 mg BID - Appropriate, Effective, Safe, Accessible Aspirin 81 mg daily - Appropriate, Effective, Safe, Accessible -Medications previously tried: Xarelto -Reviewed afib history, it is reasonable to continue without anticoagulation per cardiology -Recommended to continue current medication  COPD (Goal: control symptoms and prevent exacerbations) -Controlled - pt reports he has not needed rescue inhaler, duonebs; doing well with daily use of Symbicort -Quit smoking Jan 2023! -Gold Grade: Gold 1 (FEV1>80%) -Current COPD Classification:  A (low sx, <2 exacerbations/yr) -MMRC/CAT score: none  -Pulmonary function testing: 06/19/19 FEV1 85% -Exacerbations requiring treatment in last 6 months: 1  -Current treatment  Albuterol HFA - 2 puffs every 6 hours PRN - Appropriate, Effective, Safe, Accessible Symbicort 160-4.5 mcg/act - 2 puffs BID- Appropriate, Effective, Safe, Accessible Duoneb PRN - Appropriate, Effective, Safe, Accessible -Medications previously tried: none reported   -Patient reports consistent use of maintenance inhaler -Frequency of rescue inhaler use: very rare -Counseled on Differences between maintenance and rescue inhalers -Recommended to  continue current medication   Weak urine flow / Nocturia(Goal: manage symptoms) -Controlled - PSA 0.26 (11/2020) normal; pt reports he is not taking tamuslosin right  now, Rx ran out and he was not paying attention to whether it helped or not, he would like a refill to try again; he thinks tadalafil helps some for nighttime symptoms -Current treatment  Tamsulosin 0.4 mg daily - not taking Tadalafil 5 mg HS - Appropriate, Effective, Safe, Accessible -Medications previously tried: n/a  -Reviewed purpose for tamsulosin; advised to restart and monitor effects on urine flow  Health Maintenance -Vaccine gaps: Shingrix -MISC meds: Folic acid 1 mg daily Vitamin B12 injections Loratadine 10 mg  Meloxicam 15 mg -pt affirms compliance with new folic acid and C37 injections, mild improvement in fatigue/energy so far   Patient Goals/Self-Care Activities Patient will:  - take medications as prescribed as evidenced by patient report and record review focus on medication adherence by routine check blood pressure 2-3x weekly, document, and provide at future appointments target a minimum of 150 minutes of moderate intensity exercise weekly      Patient verbalizes understanding of instructions and care plan provided today and agrees to view in [place]. Active MyChart status and patient understanding of how to access instructions and care plan via MyChart confirmed with patient.    Telephone follow up appointment with pharmacy team member scheduled for: 3 months  Charlene Brooke, PharmD, [facility] Clinical Pharmacist [facility] [phone]

## 2021-06-30 NOTE — Progress Notes (Signed)
Chronic Care Management Pharmacy Note  06/30/2021 Name:  Brandon Lopez MRN:  [id number] DOB:  09/23/1955  Summary: CCM F/U visit -Reviewed medications; pt affirms compliance with all meds EXCEPT tamsulosin which ran out a month ago; he is not sure if it was helping or not as he had a lot of other health issues going on last month; he would like to retry tamsulosin and needs a refill -BP 118/75 at home, pt reports fatigue improved with lower dose BP med, folic acid and Z99 supplementation -Pt lives in apartment and reports he is less active without yardwork to do; he does have access to a community gym that he has not explored yet; reviewed benefits of exercise to help with overall energy levels (among other health benefits)   Recommendations/Changes made from today's visit: -Advised to restart tamsulosin, monitor for improvement in urine flow -Encouraged exercise to improve energy   Follow Up Plan: -[place] will call patient 1 month for tamsulosin update -Pharmacist follow up televisit scheduled for 3 months -AWV w/ LPN 3/57/01   Subjective: Brandon Lopez is an 66 y.o. year old male who is a primary patient of Pleas Koch, NP.  The CCM team was consulted for assistance with disease management and care coordination needs.    Engaged with patient by telephone for follow up visit in response to provider referral for pharmacy case management and/or care coordination services.   Consent to Services:  The patient was given information about Chronic Care Management services, agreed to services, and gave verbal consent prior to initiation of services.  Please see initial visit note for detailed documentation.   Patient Care Team: Pleas Koch, NP as PCP - General (Internal Medicine) Binnie Rail, DC as Referring Physician (Chiropractic Medicine) Owens Loffler, MD as Consulting Physician (Family Medicine) Charlton Haws, [facility] as Pharmacist  (Pharmacist)  Recent office visits: 06/26/21 NP Romilda Garret OV: remove sutures  06/17/21 NP Tabitha Dugal OV: BP check. 100/56. D/C bupropion (completed course) and Telmisartan-HCTZ - switch to plain telmisartan 20 mg. B12 very low (148), start B12 injections. Folate low, start folic acid 1 mg.  08/01/91 NP Allie Bossier OV: nocturia. Resume tamsulosin. Continue tadalafil.  05/07/21 pt message - started Tamsulosin 0.4 mg for bladder emptying.   04/21/21 NP Karl Ito OV: c/o SOB, fatigue. Labs normal. Advised f/u with pulmonary, cardiology.  04/09/21 NP Allie Bossier OV: c/o ED, nocturia. Rx'd tadalafil 5 mg daily.  Recent consult visits: 05/29/21 Dr Annamaria Boots (Pulmonary): COPD, OSA. Refer to ENT for [facility] option. Acute COPD exacerbation, likely viral. Ordered home nebulizer/Duoneb.  04/28/21 Dr Caryl Comes (Cardiology): f/u sick sinus syndrome, pacemaker. No med changes.   04/21/21 Dr Rockey Situ (Cardiology): f/u Afib. No med changes.  Hospital visits: 06/15/21 ED visit [facility]): laceration of R ring finger (table saw). Rx'd Keflex and Oxycodone 5 mg.   Objective:  Lab Results  Component Value Date   CREATININE 0.98 06/17/2021   BUN 12 06/17/2021   GFR 80.77 06/17/2021   GFRNONAA >60 11/11/2020   GFRAA >60 08/09/2017   NA 138 06/17/2021   K 3.9 06/17/2021   CALCIUM 9.2 06/17/2021   CO2 28 06/17/2021   GLUCOSE 89 06/17/2021    Lab Results  Component Value Date/Time   HGBA1C 5.8 04/21/2021 11:33 AM   HGBA1C 5.6 01/11/2020 12:27 PM   GFR 80.77 06/17/2021 10:55 AM   GFR 69.58 05/29/2021 11:02 AM    Last diabetic Eye exam: No results found for:  HMDIABEYEEXA  Last diabetic Foot exam: No results found for: HMDIABFOOTEX   Lab Results  Component Value Date   CHOL 145 12/11/2020   HDL 46.20 12/11/2020   LDLCALC 72 12/11/2020   TRIG 136.0 12/11/2020   CHOLHDL 3 12/11/2020       Latest Ref Rng & Units 04/21/2021   11:33 AM 12/11/2020   11:45 AM 05/04/2020    2:45 PM  Hepatic Function  Total  Protein 6.0 - 8.3 g/dL 7.0   6.6   6.2    Albumin 3.5 - 5.2 g/dL 4.4   4.2   3.3    AST 0 - 37 U/L $Remo'17   21   16    'RMKnC$ ALT 0 - 53 U/L $Remo'24   29   29    'IaKDp$ Alk Phosphatase 39 - 117 U/L 73   82   58    Total Bilirubin 0.2 - 1.2 mg/dL 0.7   0.7   1.1      Lab Results  Component Value Date/Time   TSH 2.67 06/17/2021 10:55 AM   TSH 3.19 04/21/2021 11:33 AM       Latest Ref Rng & Units 06/17/2021   10:55 AM 05/29/2021   11:02 AM 04/21/2021   11:33 AM  CBC  WBC 4.0 - 10.5 K/uL 6.7   7.5   9.0    Hemoglobin 13.0 - 17.0 g/dL 14.7   15.9   16.0    Hematocrit 39.0 - 52.0 % 43.2   46.3   45.9    Platelets 150.0 - 400.0 K/uL 246.0   193.0   268.0      No results found for: VD25OH  Clinical ASCVD: No  The 10-year ASCVD risk score (Arnett DK, et al., 2019) is: 17.5%   Values used to calculate the score:     Age: 57 years     Sex: Male     Is Non-Hispanic African American: No     Diabetic: No     Tobacco smoker: Yes     Systolic Blood Pressure: 256 mmHg     Is BP treated: Yes     HDL Cholesterol: 46.2 mg/dL     Total Cholesterol: 145 mg/dL       07/14/2020    8:14 AM 01/11/2020   11:47 AM 12/11/2018    2:51 PM  Depression screen PHQ 2/9  Decreased Interest 0 0 0  Down, Depressed, Hopeless 0 0 0  PHQ - 2 Score 0 0 0  Altered sleeping 0  0  Tired, decreased energy 0  0  Change in appetite 0  0  Feeling bad or failure about yourself  0  0  Trouble concentrating 0  0  Moving slowly or fidgety/restless 0  0  Suicidal thoughts 0  0  PHQ-9 Score 0  0  Difficult doing work/chores Not difficult at all  Not difficult at all    CHA2DS2/VAS Stroke Risk Points  Current as of yesterday     4 >= 2 Points: High Risk  1 - 1.99 Points: Medium Risk  0 Points: Low Risk    Last Change:       Points Metrics  1 Has Congestive Heart Failure:  Yes    Current as of yesterday  1 Has Vascular Disease:  Yes     Current as of yesterday  1 Has Hypertension:  Yes    Current as of yesterday  1 Age:  56     Current as  of yesterday  0 Has Diabetes:  No    Current as of yesterday  0 Had Stroke:  No  Had TIA:  No  Had Thromboembolism:  No    Current as of yesterday  0 Male:  No    Current as of yesterday   Social History   Tobacco Use  Smoking Status Former   Packs/day: 2.00   Years: 50.00   Pack years: 100.00   Types: Cigarettes   Start date: 1964   Quit date: 02/01/2021   Years since quitting: 0.4  Smokeless Tobacco Never  Tobacco Comments   started smoking at age 71, 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   BP Readings from Last 3 Encounters:  06/26/21 130/76  06/17/21 (!) 100/56  06/15/21 118/88   Pulse Readings from Last 3 Encounters:  06/26/21 80  06/17/21 71  06/15/21 86   Wt Readings from Last 3 Encounters:  06/26/21 194 lb (88 kg)  06/17/21 180 lb (81.6 kg)  06/17/21 192 lb 3 oz (87.2 kg)   BMI Readings from Last 3 Encounters:  06/26/21 31.79 kg/m  06/17/21 29.50 kg/m  06/17/21 31.50 kg/m    Assessment/Interventions: Review of patient past medical history, allergies, medications, health status, including review of consultants reports, laboratory and other test data, was performed as part of comprehensive evaluation and provision of chronic care management services.   SDOH:  (Social Determinants of Health) assessments and interventions performed: No - done June 2022, AWV upcoming this month  SDOH Screenings   Alcohol Screen: Medium Risk   Last Alcohol Screening Score (AUDIT): 13  Depression (PHQ2-9): Low Risk    PHQ-2 Score: 0  Financial Resource Strain: Low Risk    Difficulty of Paying Living Expenses: Not very hard  Food Insecurity: No Food Insecurity   Worried About Charity fundraiser in the Last Year: Never true   Ran Out of Food in the Last Year: Never true  Housing: Low Risk    Last Housing Risk Score: 0  Physical Activity: Inactive   Days of Exercise per Week: 0 days   Minutes of Exercise per Session: 0 min  Social Connections: Not on file  Stress:  Stress Concern Present   Feeling of Stress : Very much  Tobacco Use: Medium Risk   Smoking Tobacco Use: Former   Smokeless Tobacco Use: Never   Passive Exposure: Not on file  Transportation Needs: No Transportation Needs   Lack of Transportation (Medical): No   Lack of Transportation (Non-Medical): No    CCM Care Plan  Allergies  Allergen Reactions   Simvastatin Diarrhea   Lisinopril Cough    Medications Reviewed Today     Reviewed by Charlton Haws, [facility] (Pharmacist) on 06/30/21 at [place] List Status: <None>   Medication Order Taking? Sig Documenting Provider Last Dose Status Informant  albuterol (VENTOLIN HFA) 108 (90 Base) MCG/ACT inhaler [id number] Yes INHALE 2 PUFFS BY MOUTH EVERY 6 HOURS AS NEEDED FOR WHEEZING AND FOR SHORTNESS OF Linard Millers, MD Taking Active   aspirin EC 81 MG tablet [id number] Yes Take 81 mg by mouth daily. Swallow whole. [provider] Taking Active   atorvastatin (LIPITOR) 20 MG tablet [id number] Yes Take 1 tablet (20 mg total) by mouth daily. For cholesterol. Pleas Koch, NP Taking Active   carvedilol (COREG) 3.125 MG tablet [id number] Yes Take 1 tablet by mouth twice daily Deboraha Sprang, MD Taking Active   ezetimibe (ZETIA) 10 MG tablet [id number] Yes Take  1 tablet by mouth once daily Gollan, Kathlene November, MD Taking Active   folic acid (FOLVITE) 1 MG tablet [id number] Yes Take 1 tablet (1 mg total) by mouth daily. Eugenia Pancoast, FNP Taking Active   ipratropium-albuterol (DUONEB) 0.5-2.5 (3) MG/3ML SOLN [id number] Yes Take 3 mLs by nebulization every 6 (six) hours as needed. Deneise Lever, MD Taking Active   loratadine (CLARITIN) 10 MG tablet [id number] Yes Take 10 mg by mouth daily as needed for allergies.  [provider] Taking Active Self  meloxicam (MOBIC) 15 MG tablet [id number] Yes Take 0.5-1 tablets (7.5-15 mg total) by mouth daily as needed for pain. Hilts, Legrand Como, MD Taking Active   SYMBICORT 160-4.5  MCG/ACT inhaler [id number] Yes Inhale 2 puffs into the lungs 2 (two) times daily. Pleas Koch, NP Taking Active   tadalafil (CIALIS) 5 MG tablet [id number] Yes Take 1 tablet (5 mg total) by mouth daily. For nocturia and ED. Pleas Koch, NP Taking Active   tamsulosin [facility]) 0.4 MG CAPS capsule [id number] No Take 1 capsule (0.4 mg total) by mouth every evening. For urine flow  Patient not taking: Reported on 06/30/2021   Pleas Koch, NP Not Taking Active   telmisartan (MICARDIS) 20 MG tablet [id number] Yes Take 1 tablet (20 mg total) by mouth daily. 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   Laceration of right ring finger with damage to nail without foreign body 06/17/2021   Hyperglycemia 06/17/2021   Hypotension due to drugs 06/17/2021   Nocturia 06/17/2021   DOE (dyspnea on exertion) 06/17/2021   Weak urinary stream 06/02/2021   Asbestos exposure 05/29/2021   Other chest pain 04/21/2021   Shortness of breath 04/21/2021   Lightheadedness 04/21/2021   Other fatigue 04/21/2021   Nocturia more than twice per night 04/09/2021   Erectile dysfunction 04/09/2021   Skin lesions 07/03/2020   Chronic back pain 08/17/2019   Chronic shoulder pain 12/12/2018   Rectal bleeding 09/19/2018   Paroxysmal atrial fibrillation ([place]) 10/31/2017   Syncope 08/10/2017   Osteoarthritis 03/17/2017   Edema 07/21/2016   Cigarette smoker 11/17/2015   Diverticulosis 12/16/2014   COPD exacerbation ([place]) 11/26/2014   Cervical pain (neck) 10/14/2014   Hyperlipidemia 07/11/2014   Preventative health care 07/11/2014   Pacemaker 06/03/2014   Anxiety and depression 06/03/2014   Alcohol abuse 06/03/2014   OSA (obstructive sleep apnea) 06/03/2014   Essential hypertension 06/03/2014   Tobacco dependence 06/03/2014    Immunization History   Administered Date(s) Administered   Fluad Quad(high Dose 65+) 12/11/2020   Influenza,inj,Quad PF,6+ Mos 11/15/2014, 11/05/2015, 12/23/2016, 12/06/2017, 12/12/2018, 01/11/2020   PNEUMOCOCCAL CONJUGATE-20 12/11/2020   Pneumococcal Polysaccharide-23 11/04/2014   Td 10/26/2011   Tdap 06/15/2021   Zoster, Live 11/18/2015    Conditions to be addressed/monitored:  Hypertension, Hyperlipidemia, Atrial Fibrillation, and COPD, Weak urinary flow  Care Plan : [place]  Updates made by Charlton Haws, [place] since 06/30/2021 12:00 AM     Problem: Hypertension, Hyperlipidemia, Atrial Fibrillation, COPD, and Weak urinary flow   Priority: High     Long-Range Goal: Disease mgmt   Start Date: 01/27/2021  Expected End Date: 07/01/2022  This Visit's Progress: On track  Recent Progress: On track  Priority: High  Note:   Current Barriers:  Unable to  maintain control of urinary flow issues  Pharmacist Clinical Goal(s):  Patient will adhere to plan to optimize therapeutic regimen for urinary issues as evidenced by report of adherence to recommended medication management changes through collaboration with PharmD and provider.   Interventions: 1:1 collaboration with Pleas Koch, NP regarding development and update of comprehensive plan of care as evidenced by provider attestation and co-signature Inter-disciplinary care team collaboration (see longitudinal plan of care) Comprehensive medication review performed; medication list updated in electronic medical record   Hypertension (BP goal <140/90) -Controlled - BP at goal at home, not too low after switching to telmisartan (plain) -Current home readings: 118/75 -Hx OSA; did not tolerate CPAP; referred to ENT for [facility] consult -Current treatment: Telmisartan 20 mg daily- Appropriate, Effective, Safe, Accessible Carvedilol 3.125 mg BID- Appropriate, Effective, Safe, Accessible -Medications previously tried: lisinopril (cough),  HCTZ -Current dietary habits: no recent changes to diet/lifestyle -Current exercise habits: none formal; he lives in an apartment with community center and has not gone to the gym yet, he is planning to check it out soon -Counseled to monitor BP at home periodically -Recommended to continue current medication; encouraged exercise to improve fatigue as well   Hyperlipidemia: (LDL goal < 70) -Controlled - LDL 72 (11/2020), unable to tolerate higher doses of atorvastatin -Aortic atherosclerosis on CT -Current treatment: Atorvastatin 20 mg daily - Appropriate, Effective, Safe, Accessible Ezetimibe 10 mg daily - Appropriate, Effective, Safe, Accessible Aspirin 81 mg daily - Appropriate, Effective, Safe, Accessible -Medications previously tried: simvastatin  -Educated on Cholesterol goals;  -Recommended to continue current medication   Atrial Fibrillation (Goal: prevent stroke and major bleeding) -Controlled -Follows with Dr Rockey Situ, Dr Caryl Comes; original Afib episode < 12 hrs, per pacemaker Afib burden is low; he is off anticoagulation -CHADSVASC: 4 -Current treatment: Carvedilol 3.125 mg BID - Appropriate, Effective, Safe, Accessible Aspirin 81 mg daily - Appropriate, Effective, Safe, Accessible -Medications previously tried: Xarelto -Reviewed afib history, it is reasonable to continue without anticoagulation per cardiology -Recommended to continue current medication  COPD (Goal: control symptoms and prevent exacerbations) -Controlled - pt reports he has not needed rescue inhaler, duonebs; doing well with daily use of Symbicort -Quit smoking Jan 2023! -Gold Grade: Gold 1 (FEV1>80%) -Current COPD Classification:  A (low sx, <2 exacerbations/yr) -MMRC/CAT score: none  -Pulmonary function testing: 06/19/19 FEV1 85% -Exacerbations requiring treatment in last 6 months: 1  -Current treatment  Albuterol HFA - 2 puffs every 6 hours PRN - Appropriate, Effective, Safe, Accessible Symbicort 160-4.5  mcg/act - 2 puffs BID- Appropriate, Effective, Safe, Accessible Duoneb PRN - Appropriate, Effective, Safe, Accessible -Medications previously tried: none reported   -Patient reports consistent use of maintenance inhaler -Frequency of rescue inhaler use: very rare -Counseled on Differences between maintenance and rescue inhalers -Recommended to continue current medication   Weak urine flow / Nocturia(Goal: manage symptoms) -Controlled - PSA 0.26 (11/2020) normal; pt reports he is not taking tamuslosin right now, Rx ran out and he was not paying attention to whether it helped or not, he would like a refill to try again; he thinks tadalafil helps some for nighttime symptoms -Current treatment  Tamsulosin 0.4 mg daily - not taking Tadalafil 5 mg HS - Appropriate, Effective, Safe, Accessible -Medications previously tried: n/a  -Reviewed purpose for tamsulosin; advised to restart and monitor effects on urine flow  Health Maintenance -Vaccine gaps: Shingrix -MISC meds: Folic acid 1 mg daily Vitamin B12 injections Loratadine 10 mg  Meloxicam 15 mg -pt affirms compliance with new  folic acid and Y64 injections, mild improvement in fatigue/energy so far   Patient Goals/Self-Care Activities Patient will:  - take medications as prescribed as evidenced by patient report and record review focus on medication adherence by routine check blood pressure 2-3x weekly, document, and provide at future appointments target a minimum of 150 minutes of moderate intensity exercise weekly      Medication Assistance: None required.  Patient affirms current coverage meets needs.  Compliance/Adherence/Medication fill history: Care Gaps: None  Star-Rating Drugs: Atorvastatin - PDC 94% Telmisartan - PDC 100%  Medication Access: Within the past 30 days, how often has patient missed a dose of medication? 0 Is a pillbox or other method used to improve adherence? Yes  Factors that may affect medication  adherence? lack of understanding of disease management (BPH - not taking tamsulosin, unsure it is helping) Are meds synced by current pharmacy? No  Are meds delivered by current pharmacy? No  Does patient experience delays in picking up medications due to transportation concerns? No   [facility] Services Reviewed: Is patient disadvantaged to use [facility]?: No  Current Rx insurance plan: HTA Name and location of Current pharmacy:  [facility], [address] [place] [address] Phone: [phone] Fax: [phone]  [facility] [address], [place] [address] [place] [address] Phone: [phone] Fax: [phone]  [facility] services reviewed with patient today?: No  Reason patient declined to change pharmacies: Not mentioned at this visit   Care Plan and Follow Up Patient Decision:  Patient agrees to Care Plan and Follow-up.  Plan: Telephone follow up appointment with care management team member scheduled for:  3 months  Charlene Brooke, PharmD, BCACP Clinical Pharmacist [facility] at [facility] [phone]

## 2021-06-30 NOTE — Telephone Encounter (Signed)
Patient is requesting refill for tamsulosin 0.4 mg daily. He has been out for a few weeks and has not been paying attention to whether it helped or not, and is ready to give it another try to see if it will help with urine flow.  Preferred pharmacy: [facility] [address] [place] [address] Phone: [phone] Fax: [phone]

## 2021-07-01 MED ORDER — TAMSULOSIN HCL 0.4 MG PO CAPS: 0.4000 mg | ORAL_CAPSULE | Freq: Every evening | ORAL | 0 refills | Status: DC

## 2021-07-01 NOTE — Telephone Encounter (Signed)
Noted. Refill(s) sent to pharmacy.  

## 2021-07-02 DIAGNOSIS — J441 Chronic obstructive pulmonary disease with (acute) exacerbation: Secondary | ICD-10-CM | POA: Diagnosis not present

## 2021-07-07 ENCOUNTER — Other Ambulatory Visit: Payer: Self-pay | Admitting: Family

## 2021-07-07 DIAGNOSIS — I10 Essential (primary) hypertension: Secondary | ICD-10-CM

## 2021-07-13 ENCOUNTER — Other Ambulatory Visit: Payer: Self-pay

## 2021-07-13 ENCOUNTER — Encounter: Payer: Self-pay | Admitting: Internal Medicine

## 2021-07-13 ENCOUNTER — Ambulatory Visit (INDEPENDENT_AMBULATORY_CARE_PROVIDER_SITE_OTHER): Payer: PPO | Admitting: Internal Medicine

## 2021-07-13 ENCOUNTER — Emergency Department: Payer: PPO

## 2021-07-13 ENCOUNTER — Emergency Department
Admission: EM | Admit: 2021-07-13 | Discharge: 2021-07-13 | Disposition: A | Discharge status: Home / Self Care | Payer: PPO | Attending: Emergency Medicine | Admitting: Emergency Medicine

## 2021-07-13 VITALS — BP 118/68 | HR 69 | Temp 97.0°F | Ht 65.5 in | Wt 187.5 lb

## 2021-07-13 DIAGNOSIS — R1030 Lower abdominal pain, unspecified: Secondary | ICD-10-CM | POA: Insufficient documentation

## 2021-07-13 DIAGNOSIS — I509 Heart failure, unspecified: Secondary | ICD-10-CM | POA: Insufficient documentation

## 2021-07-13 DIAGNOSIS — K5792 Diverticulitis of intestine, part unspecified, without perforation or abscess without bleeding: Secondary | ICD-10-CM | POA: Insufficient documentation

## 2021-07-13 DIAGNOSIS — K5790 Diverticulosis of intestine, part unspecified, without perforation or abscess without bleeding: Secondary | ICD-10-CM | POA: Diagnosis not present

## 2021-07-13 DIAGNOSIS — J449 Chronic obstructive pulmonary disease, unspecified: Secondary | ICD-10-CM | POA: Insufficient documentation

## 2021-07-13 DIAGNOSIS — K4090 Unilateral inguinal hernia, without obstruction or gangrene, not specified as recurrent: Secondary | ICD-10-CM | POA: Diagnosis not present

## 2021-07-13 DIAGNOSIS — I110 Hypertensive heart disease with heart failure: Secondary | ICD-10-CM | POA: Insufficient documentation

## 2021-07-13 DIAGNOSIS — K529 Noninfective gastroenteritis and colitis, unspecified: Secondary | ICD-10-CM | POA: Diagnosis not present

## 2021-07-13 DIAGNOSIS — R3912 Poor urinary stream: Secondary | ICD-10-CM | POA: Diagnosis not present

## 2021-07-13 DIAGNOSIS — I700 Atherosclerosis of aorta: Secondary | ICD-10-CM | POA: Diagnosis not present

## 2021-07-13 LAB — POCT URINALYSIS DIPSTICK
Blood, UA: NEGATIVE
Glucose, UA: POSITIVE — AB
Ketones, UA: POSITIVE
Leukocytes, UA: NEGATIVE
Protein, UA: NEGATIVE
Spec Grav, UA: 1.025 (ref 1.010–1.025)
Urobilinogen, UA: 0.2 E.U./dL
pH, UA: 6 (ref 5.0–8.0)

## 2021-07-13 LAB — CBC
HCT: 43.8 % (ref 39.0–52.0)
Hemoglobin: 15 g/dL (ref 13.0–17.0)
MCH: 31.4 pg (ref 26.0–34.0)
MCHC: 34.2 g/dL (ref 30.0–36.0)
MCV: 91.8 fL (ref 80.0–100.0)
Platelets: 238 10*3/uL (ref 150–400)
RBC: 4.77 MIL/uL (ref 4.22–5.81)
RDW: 11.9 % (ref 11.5–15.5)
WBC: 11.1 10*3/uL — ABNORMAL HIGH (ref 4.0–10.5)
nRBC: 0 % (ref 0.0–0.2)

## 2021-07-13 LAB — URINALYSIS, ROUTINE W REFLEX MICROSCOPIC
Bilirubin Urine: NEGATIVE
Glucose, UA: NEGATIVE mg/dL
Hgb urine dipstick: NEGATIVE
Ketones, ur: NEGATIVE mg/dL
Leukocytes,Ua: NEGATIVE
Nitrite: NEGATIVE
Protein, ur: 100 mg/dL — AB
Specific Gravity, Urine: 1.025 (ref 1.005–1.030)
pH: 5 (ref 5.0–8.0)

## 2021-07-13 LAB — COMPREHENSIVE METABOLIC PANEL
ALT: 19 U/L (ref 0–44)
AST: 15 U/L (ref 15–41)
Albumin: 3.9 g/dL (ref 3.5–5.0)
Alkaline Phosphatase: 53 U/L (ref 38–126)
Anion gap: 9 (ref 5–15)
BUN: 17 mg/dL (ref 8–23)
CO2: 21 mmol/L — ABNORMAL LOW (ref 22–32)
Calcium: 8.7 mg/dL — ABNORMAL LOW (ref 8.9–10.3)
Chloride: 105 mmol/L (ref 98–111)
Creatinine, Ser: 0.87 mg/dL (ref 0.61–1.24)
GFR, Estimated: >60 mL/min (ref >60)
Glucose, Bld: 121 mg/dL — ABNORMAL HIGH (ref 70–99)
Potassium: 3.7 mmol/L (ref 3.5–5.1)
Sodium: 135 mmol/L (ref 135–145)
Total Bilirubin: 1.2 mg/dL (ref 0.3–1.2)
Total Protein: 7.1 g/dL (ref 6.5–8.1)

## 2021-07-13 LAB — LIPASE, BLOOD: Lipase: 29 U/L (ref 11–51)

## 2021-07-13 MED ORDER — AMOXICILLIN-POT CLAVULANATE 875-125 MG PO TABS: 1.0000 | ORAL_TABLET | Freq: Two times a day (BID) | ORAL | 0 refills | Status: DC

## 2021-07-13 MED ORDER — OXYCODONE-ACETAMINOPHEN 5-325 MG PO TABS
1.0000 | ORAL_TABLET | Freq: Once | ORAL | Status: AC
  Administered 2021-07-13: 1 via ORAL
  Filled 2021-07-13: qty 1

## 2021-07-13 MED ORDER — AMOXICILLIN-POT CLAVULANATE 875-125 MG PO TABS
1.0000 | ORAL_TABLET | Freq: Once | ORAL | Status: AC
  Administered 2021-07-13: 1 via ORAL
  Filled 2021-07-13: qty 1

## 2021-07-13 NOTE — Progress Notes (Signed)
Subjective:    Patient ID: Brandon Lopez, male    DOB: 25-Jul-1955, 66 y.o.   MRN: [id number]  HPI Here due to abdominal pain  Has had 2-3 days of abdominal pain Cold chills, diarrhea, muscle pain--wondered about diverticulitis Skin even felt "nerve endings standing on end" Diarrhea and muscle pain both some better No fever Stools less often today but still loose/watery---still 10 times today (more yesterday) No N/V Lower abdominal pain---both sides  Hasn't tried any medications for this Appetite is off--but eating a little  Also ongoing issues with mood "I just don't give a crap"---some sadness Ruminating on mistakes Has gone to therapist at length--didn't help "Not as busy as I want to be"---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 Swallow whole.     atorvastatin (LIPITOR) 20 MG tablet Take 1 tablet (20 mg total) by mouth daily. For cholesterol. 90 tablet 2   carvedilol (COREG) 3.125 MG tablet Take 1 tablet by mouth twice daily 180 tablet 0   ezetimibe (ZETIA) 10 MG tablet Take 1 tablet by mouth once daily 90 tablet 2   folic acid (FOLVITE) 1 MG tablet Take 1 tablet (1 mg total) by mouth daily. 90 tablet 1   ipratropium-albuterol (DUONEB) 0.5-2.5 (3) MG/3ML SOLN Take 3 mLs by nebulization every 6 (six) hours as needed. 360 mL 3   loratadine (CLARITIN) 10 MG tablet Take 10 mg by mouth daily as needed for allergies.      meloxicam (MOBIC) 15 MG tablet Take 0.5-1 tablets (7.5-15 mg total) by mouth daily as needed for pain. 30 tablet 6   SYMBICORT 160-4.5 MCG/ACT inhaler Inhale 2 puffs into the lungs 2 (two) times daily. 33 g 0   tadalafil (CIALIS) 5 MG tablet Take 1  tablet (5 mg total) by mouth daily. For nocturia and ED. 90 tablet 0   tamsulosin (FLOMAX) 0.4 MG CAPS capsule Take 1 capsule (0.4 mg total) by mouth every evening. For urine flow 30 capsule 0   telmisartan (MICARDIS) 20 MG tablet Take 1 tablet by mouth once daily 30 tablet 0   No current facility-administered medications on file prior to visit.    Allergies  Allergen Reactions   Simvastatin Diarrhea   Lisinopril Cough    Past Medical History:  Diagnosis Date   Actinic keratosis    Arthritis    Chronic systolic CHF (congestive heart failure) ([place])    a. echo 6/16: EF 55-60%, Gr1DD, LA nl, PASP nl; b. echo 7/19: EF 40-45%, unable to exclude RWMA, Gr1DD, LA nl, RVSF nl, PASP nl   COPD (chronic obstructive pulmonary disease) (HCC)    Depression    Diverticulosis    Found during colonoscopy   Hypertension    Kidney stones    PAF (paroxysmal atrial fibrillation) ([place])    a. short episodes previously noted on PPM interrogation; b. started on Xarelto 07/2017 given further episodes of Afib noted during interrogation; c. 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 Biotronik; Dayna Barker DR-T [id number]; placed 08/2012   Sleep  apnea    Symptomatic bradycardia    a. s/p Biotronik; Dayna Barker DR-T [id number]; [facility] 08/2012 at [facility]    Past Surgical History:  Procedure Laterality Date   APPENDECTOMY  age 63   CARDIAC CATHETERIZATION     7 years ago   COLONOSCOPY N/A 05/27/2014   Procedure: COLONOSCOPY;  Surgeon: Hulen Luster, MD;  Location: [facility] ENDOSCOPY;  Service: Gastroenterology;  Laterality: N/A;   INSERT / REPLACE / REMOVE PACEMAKER     3 years ago    Family History  Problem Relation Age of Onset   Arthritis Mother    Heart disease Father    Hyperlipidemia Father    Hypertension Father    Cancer Paternal Grandfather     Social History   Socioeconomic History   Marital  status: Single    Spouse name: Not on file   Number of children: Not on file   Years of education: Not on file   Highest education level: Not on file  Occupational History   Not on file  Tobacco Use   Smoking status: Former    Packs/day: 2.00    Years: 50.00    Total pack years: 100.00    Types: Cigarettes    Start date: 34    Quit date: 02/01/2021    Years since quitting: 0.4   Smokeless tobacco: Never   Tobacco comments:    started smoking at age 64, 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  Vaping Use   Vaping Use: Never used  Substance and Sexual Activity   Alcohol use: Yes    Alcohol/week: 8.0 - 14.0 standard drinks of alcohol    Types: 8 - 14 Cans of beer per week   Drug use: Yes    Types: Marijuana   Sexual activity: Not on file  Other Topics Concern   Not on file  Social History Narrative   Not on file   Social Determinants of Health   Financial Resource Strain: Low Risk  (01/27/2021)   Overall Financial Resource Strain (CARDIA)    Difficulty of Paying Living Expenses: Not very hard  Food Insecurity: No Food Insecurity (07/14/2020)   Hunger Vital Sign    Worried About Running Out of Food in the Last Year: Never true    Ran Out of Food in the Last Year: Never true  Transportation Needs: No Transportation Needs (07/14/2020)   PRAPARE - Hydrologist (Medical): No    Lack of Transportation (Non-Medical): No  Physical Activity: Inactive (07/14/2020)   Exercise Vital Sign    Days of Exercise per Week: 0 days    Minutes of Exercise per Session: 0 min  Stress: Stress Concern Present (07/14/2020)   [place]    Feeling of Stress : Very much  Social Connections: Not on file  Intimate Partner Violence: Not At Risk (07/14/2020)   Humiliation, Afraid, Rape, and Kick questionnaire    Fear of Current or Ex-Partner: No    Emotionally Abused: No    Physically Abused: No    Sexually Abused: No    Review of Systems Urinary stream is slow--ongoing nocturia Slight burning rarely--slight  No rash    Objective:   Physical Exam Constitutional:      Appearance: He is well-developed.  Pulmonary:     Effort: Pulmonary effort is normal.     Breath sounds: Normal breath sounds. No wheezing or rales.  Abdominal:     General: Bowel sounds are  normal. There is no distension.     Palpations: Abdomen is soft. There is no mass.     Comments: Mild suprapubic tenderness + suprapubic dullness  Neurological:     Mental Status: He is alert.            Assessment & Plan:

## 2021-07-13 NOTE — Assessment & Plan Note (Signed)
With significant diarrhea---now improving Urinalysis negative for nitrite/leukocytes--no UTI Could be viral gastroenteritis Not consistent with diverticulitis Will just observe since improving

## 2021-07-13 NOTE — ED Provider Notes (Signed)
[facility] Provider Note    Event Date/Time   First MD Initiated Contact with Patient 07/13/21 2029     (approximate)  History   Chief Complaint: Abdominal Pain  HPI  Brandon Lopez is a 66 y.o. male with a past medical history of COPD, paroxysmal atrial fibrillation, CHF, hypertension, presents emergency department for lower abdominal pain.  According to the patient over the past 4 days or so he has been experiencing lower abdominal pain more so on the right lower quadrant.  He also feels like he is not able to fully urinate when he urinates.  States he has had similar symptoms in the past with a kidney stone but denies any hematuria.  States he has had some loose stool but denies any nausea or vomiting.  No fever.  No dysuria.  Physical Exam   Triage Vital Signs: ED Triage Vitals [07/13/21 1902]  Enc Vitals Group     BP 118/74     Pulse Rate 93     Resp 16     Temp 97.8 F (36.6 C)     Temp Source Oral     SpO2 93 %     Weight      Height      Head Circumference      Peak Flow      Pain Score      Pain Loc      Pain Edu?      Excl. in [place]?     Most recent vital signs: Vitals:   07/13/21 1902  BP: 118/74  Pulse: 93  Resp: 16  Temp: 97.8 F (36.6 C)  SpO2: 93%    General: Awake, no distress.  CV:  Good peripheral perfusion.  Regular rate and rhythm  Resp:  Normal effort.  Equal breath sounds bilaterally.  Abd:  No distention.  Soft, mild suprapubic tenderness no rebound or guarding   ED Results / Procedures / Treatments   RADIOLOGY  I have interpreted the CT images, no obvious kidney stone seen on my evaluation. Radiology is read the CT scan consistent with acute diverticulitis.   MEDICATIONS ORDERED IN ED: Medications - No data to display   IMPRESSION / MDM / [place] / ED COURSE  I reviewed the triage vital signs and the nursing notes.  Patient's presentation is most consistent with acute presentation  with potential threat to life or bodily function.  Patient presents emergency department for suprapubic/right lower quadrant discomfort.  Patient is status post appendectomy.  Patient states symptoms have been ongoing for 4 days he also has a sensation that he cannot fully urinate.  We will check labs, CT renal scan and continue to closely monitor.  Patient's CBC shows slight leukocytosis otherwise normal, chemistry is reassuring.  Urinalysis shows slight amount of white cells and bacteria no white blood cell clumps.  Equivocal we will send a urine culture.  We will proceed with CT renal scan to further evaluate.  Patient is fully voiding prior to CT imaging.  Patient CT scan shows acute diverticulitis.  We will cover with Augmentin.  Urine culture has been sent as a precaution however patient symptoms are much more likely due to acute diverticulitis.  Discussed return precautions.  Patient to follow-up with his doctor.  FINAL CLINICAL IMPRESSION(S) / ED DIAGNOSES   Lower abdominal pain Diverticulitis   Note:  This document was prepared using Dragon voice recognition software and may include unintentional dictation errors.  Harvest Dark, MD 07/13/21 2123

## 2021-07-13 NOTE — Assessment & Plan Note (Signed)
He has had slow stream despite the tamsulosin and tadalfil I am concerned about incomplete emptying---will refer to urology If pain gets severe---to ER

## 2021-07-13 NOTE — ED Triage Notes (Signed)
Pt presents to ER c/o RLQ pain that started Thursday last week.  Pt states pain was initially in the lower abdomen on Thursday and is now staying in RLQ.  Pt states he thinks it feels similar to his last kidney stone.  Pt denies n/v but has had some diarrhea.  Pt does endorse some dysuria.  Pt is A&O x4 at this time in NAD in triage.

## 2021-07-15 ENCOUNTER — Ambulatory Visit (INDEPENDENT_AMBULATORY_CARE_PROVIDER_SITE_OTHER): Payer: PPO

## 2021-07-15 VITALS — Wt 187.0 lb

## 2021-07-15 DIAGNOSIS — Z0000 Encounter for general adult medical examination without abnormal findings: Secondary | ICD-10-CM | POA: Diagnosis not present

## 2021-07-15 LAB — URINE CULTURE: Culture: NO GROWTH

## 2021-07-15 NOTE — Progress Notes (Signed)
Virtual Visit via Telephone Note  I connected with  Brandon Lopez on 07/15/21 at  8:15 AM EDT by telephone and verified that I am speaking with the correct person using two identifiers.  Location: Patient: home Provider: [place] Persons participating in the virtual visit: [name]   I discussed the limitations, risks, security and privacy concerns of performing an evaluation and management service by telephone and the availability of in person appointments. The patient expressed understanding and agreed to proceed.  Interactive audio and video telecommunications were attempted between this nurse and patient, however failed, due to patient having technical difficulties OR patient did not have access to video capability.  We continued and completed visit with audio only.  Some vital signs may be absent or patient reported.   Brandon David, LPN  Subjective:   Brandon Lopez is a 66 y.o. male who presents for Medicare Annual/Subsequent preventive examination.  Review of Systems           Objective:    There were no vitals filed for this visit. There is no height or weight on file to calculate BMI.     07/13/2021    7:06 PM 06/15/2021    1:55 PM 11/11/2020   12:27 PM 07/14/2020    8:12 AM 12/11/2018    2:48 PM 12/06/2017    9:38 AM 08/10/2017    3:07 PM  Advanced Directives  Does Patient Have a Medical Advance Directive? No No No No No Yes No  Type of Teacher, early years/pre;Living will   Copy of [place] in Chart?      No - copy requested   Would patient like information on creating a medical advance directive?  No - Patient declined No - Patient declined No - Patient declined Yes (MAU/Ambulatory/Procedural Areas - Information given)  No - Patient declined    Current Medications (verified) Outpatient Encounter Medications as of 07/15/2021  Medication Sig   albuterol (VENTOLIN HFA)  108 (90 Base) MCG/ACT inhaler INHALE 2 PUFFS BY MOUTH EVERY 6 HOURS AS NEEDED FOR WHEEZING AND FOR SHORTNESS OF BREATH   amoxicillin-clavulanate (AUGMENTIN) 875-125 MG tablet Take 1 tablet by mouth 2 (two) times daily.   aspirin EC 81 MG tablet Take 81 mg by mouth daily. Swallow whole.   atorvastatin (LIPITOR) 20 MG tablet Take 1 tablet (20 mg total) by mouth daily. For cholesterol.   carvedilol (COREG) 3.125 MG tablet Take 1 tablet by mouth twice daily   ezetimibe (ZETIA) 10 MG tablet Take 1 tablet by mouth once daily   folic acid (FOLVITE) 1 MG tablet Take 1 tablet (1 mg total) by mouth daily.   ipratropium-albuterol (DUONEB) 0.5-2.5 (3) MG/3ML SOLN Take 3 mLs by nebulization every 6 (six) hours as needed.   loratadine (CLARITIN) 10 MG tablet Take 10 mg by mouth daily as needed for allergies.    meloxicam (MOBIC) 15 MG tablet Take 0.5-1 tablets (7.5-15 mg total) by mouth daily as needed for pain.   SYMBICORT 160-4.5 MCG/ACT inhaler Inhale 2 puffs into the lungs 2 (two) times daily.   tadalafil (CIALIS) 5 MG tablet Take 1 tablet (5 mg total) by mouth daily. For nocturia and ED.   tamsulosin (FLOMAX) 0.4 MG CAPS capsule Take 1 capsule (0.4 mg total) by mouth every evening. For urine flow   telmisartan (MICARDIS) 20 MG tablet Take 1 tablet by mouth once daily   No facility-administered  encounter medications on file as of 07/15/2021.    Allergies (verified) Simvastatin and Lisinopril   History: Past Medical History:  Diagnosis Date   Actinic keratosis    Arthritis    Chronic systolic CHF (congestive heart failure) ([place])    a. echo 6/16: EF 55-60%, Gr1DD, LA nl, PASP nl; b. echo 7/19: EF 40-45%, unable to exclude RWMA, Gr1DD, LA nl, RVSF nl, PASP nl   COPD (chronic obstructive pulmonary disease) (HCC)    Depression    Diverticulosis    Found during colonoscopy   Hypertension    Kidney stones    PAF (paroxysmal atrial fibrillation) ([place])    a. short episodes previously noted on PPM  interrogation; b. started on Xarelto 07/2017 given further episodes of Afib noted during interrogation; c. 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 Biotronik; Dayna Barker DR-T [id number]; placed 08/2012   Sleep apnea    Symptomatic bradycardia    a. s/p Biotronik; Dayna Barker DR-T [id number]; placed 08/2012 at [facility]   Past Surgical History:  Procedure Laterality Date   APPENDECTOMY  age 80   CARDIAC CATHETERIZATION     7 years ago   COLONOSCOPY N/A 05/27/2014   Procedure: COLONOSCOPY;  Surgeon: Hulen Luster, MD;  Location: [facility] ENDOSCOPY;  Service: Gastroenterology;  Laterality: N/A;   INSERT / REPLACE / REMOVE PACEMAKER     3 years ago   Family History  Problem Relation Age of Onset   Arthritis Mother    Heart disease Father    Hyperlipidemia Father    Hypertension Father    Cancer Paternal Grandfather    Social History   Socioeconomic History   Marital status: Single    Spouse name: Not on file   Number of children: Not on file   Years of education: Not on file   Highest education level: Not on file  Occupational History   Not on file  Tobacco Use   Smoking status: Former    Packs/day: 2.00    Years: 50.00    Total pack years: 100.00    Types: Cigarettes    Start date: 66    Quit date: 02/01/2021    Years since quitting: 0.4   Smokeless tobacco: Never   Tobacco comments:    started smoking at age 89, 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  Vaping Use   Vaping Use: Never used  Substance and Sexual Activity   Alcohol use: Yes    Alcohol/week: 8.0 - 14.0 standard drinks of alcohol    Types: 8 - 14 Cans of beer per week   Drug use: Yes    Types: Marijuana   Sexual activity: Not on file  Other Topics Concern   Not on file  Social History Narrative   Not on file   Social Determinants of Health   Financial Resource Strain: Low Risk  (01/27/2021)   Overall Financial Resource  Strain (CARDIA)    Difficulty of Paying Living Expenses: Not very hard  Food Insecurity: No Food Insecurity (07/14/2020)   Hunger Vital Sign    Worried About Running Out of Food in the Last Year: Never true    Ran Out of Food in the Last Year: Never true  Transportation Needs: No Transportation Needs (07/14/2020)   PRAPARE - Hydrologist (Medical): No    Lack of Transportation (Non-Medical): No  Physical  Activity: Inactive (07/15/2021)   Exercise Vital Sign    Days of Exercise per Week: 0 days    Minutes of Exercise per Session: 0 min  Stress: No Stress Concern Present (07/15/2021)   [place]    Feeling of Stress : Only a little  Social Connections: Not on file    Tobacco Counseling Counseling given: Not Answered Tobacco comments: started smoking at age 63, 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   Clinical Intake:  Pre-visit preparation completed: Yes        Nutritional Risks: None Diabetes: No  How often do you need to have someone help you when you read instructions, pamphlets, or other written materials from your doctor or pharmacy?: 1 - Never  Diabetic?no  Interpreter Needed?: No  Information entered by :: Kirke Shaggy, LPN   Activities of Daily Living     No data to display          Patient Care Team: Pleas Koch, NP as PCP - General (Internal Medicine) Binnie Rail, DC as Referring Physician (Chiropractic Medicine) Owens Loffler, MD as Consulting Physician (Family Medicine) Charlton Haws, [facility] as Pharmacist (Pharmacist)  Indicate any recent Medical Services you may have received from other than [facility] providers in the past year (date may be approximate).     Assessment:   This is a routine wellness examination for [place].  Hearing/Vision screen No results found.  Dietary issues and exercise activities discussed:     Goals Addressed              This Visit's Progress    DIET - EAT MORE FRUITS AND VEGETABLES         Depression Screen    07/15/2021    8:12 AM 07/14/2020    8:14 AM 01/11/2020   11:47 AM 12/11/2018    2:51 PM 12/06/2017    9:41 AM 04/14/2016    3:06 PM 11/05/2015    9:07 AM  PHQ 2/9 Scores  PHQ - 2 Score 0 0 0 0 0 6 0  PHQ- 9 Score 3 0  0 0 17     Fall Risk    07/14/2020    8:14 AM 01/11/2020   11:47 AM 12/11/2018    2:49 PM 12/06/2017    9:41 AM 11/05/2015    9:07 AM  Fall Risk   Falls in the past year? 1 0 0 0 No  Number falls in past yr: 1  0    Injury with Fall? 0  0    Risk for fall due to : Medication side effect  Medication side effect    Follow up Falls evaluation completed;Falls prevention discussed  Falls evaluation completed;Falls prevention discussed      FALL RISK PREVENTION PERTAINING TO THE HOME:  Any stairs in or around the home? No  If so, are there any without handrails? No  Home free of loose throw rugs in walkways, pet beds, electrical cords, etc? Yes  Adequate lighting in your home to reduce risk of falls? Yes   ASSISTIVE DEVICES UTILIZED TO PREVENT FALLS:  Life alert? No  Use of a cane, walker or w/c? No  Grab bars in the bathroom? Yes  Shower chair or bench in shower? No  Elevated toilet seat or a handicapped toilet? No   Cognitive Function:refused 2023      07/14/2020    8:17 AM 12/11/2018    2:53 PM 12/06/2017    9:41 AM  11/05/2015    9:13 AM  MMSE - Mini Mental State Exam  Not completed: Refused     Orientation to time  '5 5 5  '$ Orientation to Place  '5 5 5  '$ Registration  '3 3 3  '$ Attention/ Calculation  5 0 0  Recall  '3 3 3  '$ Language- name 2 objects   0 0  Language- repeat  '1 1 1  '$ Language- follow 3 step command   3 3  Language- read & follow direction   0 0  Write a sentence   0 0  Copy design   0 0  Total score   20 20        Immunizations Immunization History  Administered Date(s) Administered   Fluad Quad(high Dose 65+) 12/11/2020    Influenza,inj,Quad PF,6+ Mos 11/15/2014, 11/05/2015, 12/23/2016, 12/06/2017, 12/12/2018, 01/11/2020   PNEUMOCOCCAL CONJUGATE-20 12/11/2020   Pneumococcal Polysaccharide-23 11/04/2014   Td 10/26/2011   Tdap 06/15/2021   Zoster, Live 11/18/2015    TDAP status: Up to date  Flu Vaccine status: Up to date  Pneumococcal vaccine status: Up to date  Covid-19 vaccine status: Declined, Education has been provided regarding the importance of this vaccine but patient still declined. Advised may receive this vaccine at local pharmacy or Health Dept.or vaccine clinic. Aware to provide a copy of the vaccination record if obtained from local pharmacy or Health Dept. Verbalized acceptance and understanding.  Qualifies for Shingles Vaccine? Yes   Zostavax completed Yes   Shingrix Completed?: No.    Education has been provided regarding the importance of this vaccine. Patient has been advised to call insurance company to determine out of pocket expense if they have not yet received this vaccine. Advised may also receive vaccine at local pharmacy or Health Dept. Verbalized acceptance and understanding.  Screening Tests Health Maintenance  Topic Date Due   Zoster Vaccines- Shingrix (1 of 2) Never done   HIV Screening  11/04/2025 (Originally 09/11/1970)   INFLUENZA VACCINE  08/25/2021   COLONOSCOPY (Pts 45-66yr Insurance coverage will need to be confirmed)  05/26/2024   TETANUS/TDAP  06/16/2031   Pneumonia Vaccine 66 Years old  Completed   Hepatitis C Screening  Completed   HPV VACCINES  Aged Out   COVID-19 Vaccine  Discontinued    Health Maintenance  Health Maintenance Due  Topic Date Due   Zoster Vaccines- Shingrix (1 of 2) Never done    Colorectal cancer screening: Type of screening: Colonoscopy. Completed 05/27/14. Repeat every 10 years  Lung Cancer Screening: (Low Dose CT Chest recommended if Age 66-80years, 30 pack-year currently smoking OR have quit w/in 15years.) does not qualify.    Additional Screening:  Hepatitis C Screening: does qualify; Completed 11/18/15  Vision Screening: Recommended annual ophthalmology exams for early detection of glaucoma and other disorders of the eye. Is the patient up to date with their annual eye exam?  No  Who is the provider or what is the name of the office in which the patient attends annual eye exams? No one If pt is not established with a provider, would they like to be referred to a provider to establish care? No .   Dental Screening: Recommended annual dental exams for proper oral hygiene  Community Resource Referral / Chronic Care Management: CRR required this visit?  No   CCM required this visit?  No      Plan:     I have personally reviewed and noted the following in the patient's chart:  Medical and social history Use of alcohol, tobacco or illicit drugs  Current medications and supplements including opioid prescriptions. Patient is not currently taking opioid prescriptions. Functional ability and status Nutritional status Physical activity Advanced directives List of other physicians Hospitalizations, surgeries, and ER visits in previous 12 months Vitals Screenings to include cognitive, depression, and falls Referrals and appointments  In addition, I have reviewed and discussed with patient certain preventive protocols, quality metrics, and best practice recommendations. A written personalized care plan for preventive services as well as general preventive health recommendations were provided to patient.     Brandon David, LPN   7/74/1287   Nurse Notes: none

## 2021-07-15 NOTE — Patient Instructions (Signed)
Brandon Lopez , Thank you for taking time to come for your Medicare Wellness Visit. I appreciate your ongoing commitment to your health goals. Please review the following plan we discussed and let me know if I can assist you in the future.   Screening recommendations/referrals: Colonoscopy: 05/27/14 Recommended yearly ophthalmology/optometry visit for glaucoma screening and checkup Recommended yearly dental visit for hygiene and checkup  Vaccinations: Influenza vaccine: 12/11/20 Pneumococcal vaccine: 12/11/20 Tdap vaccine: 06/15/21 Shingles vaccine: Zostavax 11/18/15   Covid-19: n/c  Advanced directives: no  Conditions/risks identified: none  Next appointment: Follow up in one year for your annual wellness visit. 07/20/22 @ 8:15 am by phone  Preventive Care 65 Years and Older, Male Preventive care refers to lifestyle choices and visits with your health care provider that can promote health and wellness. What does preventive care include? A yearly physical exam. This is also called an annual well check. Dental exams once or twice a year. Routine eye exams. Ask your health care provider how often you should have your eyes checked. Personal lifestyle choices, including: Daily care of your teeth and gums. Regular physical activity. Eating a healthy diet. Avoiding tobacco and drug use. Limiting alcohol use. Practicing safe sex. Taking low doses of aspirin every day. Taking vitamin and mineral supplements as recommended by your health care provider. What happens during an annual well check? The services and screenings done by your health care provider during your annual well check will depend on your age, overall health, lifestyle risk factors, and family history of disease. Counseling  Your health care provider may ask you questions about your: Alcohol use. Tobacco use. Drug use. Emotional well-being. Home and relationship well-being. Sexual activity. Eating habits. History of  falls. Memory and ability to understand (cognition). Work and work Statistician. Screening  You may have the following tests or measurements: Height, weight, and BMI. Blood pressure. Lipid and cholesterol levels. These may be checked every 5 years, or more frequently if you are over 54 years old. Skin check. Lung cancer screening. You may have this screening every year starting at age 84 if you have a 30-pack-year history of smoking and currently smoke or have quit within the past 15 years. Fecal occult blood test (FOBT) of the stool. You may have this test every year starting at age 42. Flexible sigmoidoscopy or colonoscopy. You may have a sigmoidoscopy every 5 years or a colonoscopy every 10 years starting at age 62. Prostate cancer screening. Recommendations will vary depending on your family history and other risks. Hepatitis C blood test. Hepatitis B blood test. Sexually transmitted disease (STD) testing. Diabetes screening. This is done by checking your blood sugar (glucose) after you have not eaten for a while (fasting). You may have this done every 1-3 years. Abdominal aortic aneurysm (AAA) screening. You may need this if you are a current or former smoker. Osteoporosis. You may be screened starting at age 62 if you are at high risk. Talk with your health care provider about your test results, treatment options, and if necessary, the need for more tests. Vaccines  Your health care provider may recommend certain vaccines, such as: Influenza vaccine. This is recommended every year. Tetanus, diphtheria, and acellular pertussis (Tdap, Td) vaccine. You may need a Td booster every 10 years. Zoster vaccine. You may need this after age 70. Pneumococcal 13-valent conjugate (PCV13) vaccine. One dose is recommended after age 9. Pneumococcal polysaccharide (PPSV23) vaccine. One dose is recommended after age 20. Talk to your health care provider  about which screenings and vaccines you need and  how often you need them. This information is not intended to replace advice given to you by your health care provider. Make sure you discuss any questions you have with your health care provider. Document Released: 02/07/2015 Document Revised: 10/01/2015 Document Reviewed: 11/12/2014 Elsevier Interactive Patient Education  2017 [place] Prevention in the Home Falls can cause injuries. They can happen to people of all ages. There are many things you can do to make your home safe and to help prevent falls. What can I do on the outside of my home? Regularly fix the edges of walkways and driveways and fix any cracks. Remove anything that might make you trip as you walk through a door, such as a raised step or threshold. Trim any bushes or trees on the path to your home. Use bright outdoor lighting. Clear any walking paths of anything that might make someone trip, such as rocks or tools. Regularly check to see if handrails are loose or broken. Make sure that both sides of any steps have handrails. Any raised decks and porches should have guardrails on the edges. Have any leaves, snow, or ice cleared regularly. Use sand or salt on walking paths during winter. Clean up any spills in your garage right away. This includes oil or grease spills. What can I do in the bathroom? Use night lights. Install grab bars by the toilet and in the tub and shower. Do not use towel bars as grab bars. Use non-skid mats or decals in the tub or shower. If you need to sit down in the shower, use a plastic, non-slip stool. Keep the floor dry. Clean up any water that spills on the floor as soon as it happens. Remove soap buildup in the tub or shower regularly. Attach bath mats securely with double-sided non-slip rug tape. Do not have throw rugs and other things on the floor that can make you trip. What can I do in the bedroom? Use night lights. Make sure that you have a light by your bed that is easy to  reach. Do not use any sheets or blankets that are too big for your bed. They should not hang down onto the floor. Have a firm chair that has side arms. You can use this for support while you get dressed. Do not have throw rugs and other things on the floor that can make you trip. What can I do in the kitchen? Clean up any spills right away. Avoid walking on wet floors. Keep items that you use a lot in easy-to-reach places. If you need to reach something above you, use a strong step stool that has a grab bar. Keep electrical cords out of the way. Do not use floor polish or wax that makes floors slippery. If you must use wax, use non-skid floor wax. Do not have throw rugs and other things on the floor that can make you trip. What can I do with my stairs? Do not leave any items on the stairs. Make sure that there are handrails on both sides of the stairs and use them. Fix handrails that are broken or loose. Make sure that handrails are as long as the stairways. Check any carpeting to make sure that it is firmly attached to the stairs. Fix any carpet that is loose or worn. Avoid having throw rugs at the top or bottom of the stairs. If you do have throw rugs, attach them to the floor  with carpet tape. Make sure that you have a light switch at the top of the stairs and the bottom of the stairs. If you do not have them, ask someone to add them for you. What else can I do to help prevent falls? Wear shoes that: Do not have high heels. Have rubber bottoms. Are comfortable and fit you well. Are closed at the toe. Do not wear sandals. If you use a stepladder: Make sure that it is fully opened. Do not climb a closed stepladder. Make sure that both sides of the stepladder are locked into place. Ask someone to hold it for you, if possible. Clearly mark and make sure that you can see: Any grab bars or handrails. First and last steps. Where the edge of each step is. Use tools that help you move  around (mobility aids) if they are needed. These include: Canes. Walkers. Scooters. Crutches. Turn on the lights when you go into a dark area. Replace any light bulbs as soon as they burn out. Set up your furniture so you have a clear path. Avoid moving your furniture around. If any of your floors are uneven, fix them. If there are any pets around you, be aware of where they are. Review your medicines with your doctor. Some medicines can make you feel dizzy. This can increase your chance of falling. Ask your doctor what other things that you can do to help prevent falls. This information is not intended to replace advice given to you by your health care provider. Make sure you discuss any questions you have with your health care provider. Document Released: 11/07/2008 Document Revised: 06/19/2015 Document Reviewed: 02/15/2014 Elsevier Interactive Patient Education  2017 [employer].

## 2021-07-23 ENCOUNTER — Ambulatory Visit (INDEPENDENT_AMBULATORY_CARE_PROVIDER_SITE_OTHER): Payer: PPO | Admitting: Primary Care

## 2021-07-23 ENCOUNTER — Encounter: Payer: Self-pay | Admitting: Primary Care

## 2021-07-23 VITALS — BP 140/100 | HR 70 | Temp 98.2°F | Ht 65.5 in | Wt 190.0 lb

## 2021-07-23 DIAGNOSIS — F1010 Alcohol abuse, uncomplicated: Secondary | ICD-10-CM

## 2021-07-23 DIAGNOSIS — F32A Depression, unspecified: Secondary | ICD-10-CM

## 2021-07-23 DIAGNOSIS — F419 Anxiety disorder, unspecified: Secondary | ICD-10-CM

## 2021-07-23 DIAGNOSIS — R351 Nocturia: Secondary | ICD-10-CM | POA: Diagnosis not present

## 2021-07-23 DIAGNOSIS — R1030 Lower abdominal pain, unspecified: Secondary | ICD-10-CM | POA: Diagnosis not present

## 2021-07-23 DIAGNOSIS — N529 Male erectile dysfunction, unspecified: Secondary | ICD-10-CM | POA: Diagnosis not present

## 2021-07-23 MED ORDER — BUPROPION HCL ER (SR) 100 MG PO TB12: 100.0000 mg | ORAL_TABLET | Freq: Two times a day (BID) | ORAL | 0 refills | Status: DC

## 2021-07-23 MED ORDER — TADALAFIL 5 MG PO TABS: 5.0000 mg | ORAL_TABLET | Freq: Every day | ORAL | 0 refills | Status: DC

## 2021-07-23 NOTE — Patient Instructions (Signed)
Resume bupropion (Wellbutrin) For depression. Take 1 tablet by mouth once daily for 5 days, then increase to 1 tablet twice daily thereafter.  Continue the antibiotics until gone.  It was a pleasure to see you today!

## 2021-07-23 NOTE — Assessment & Plan Note (Signed)
Diagnosed with acute diverticulitis in ED. Reviewed ED notes, labs, imaging.  Continue Augmentin course until complete. Exam today benign.   Continue to monitor.

## 2021-07-23 NOTE — Assessment & Plan Note (Signed)
Chronic, deteriorated.   Agree to resume bupropion. Start with SR 100 mg once daily x 5 days, then increase to BID thereafter. Stop 5HTP supplement.  He will update if no improvement.

## 2021-07-23 NOTE — Progress Notes (Signed)
Subjective:    Patient ID: Brandon Lopez, male    DOB: 1955/08/08, 66 y.o.   MRN: [id number]  HPI  Brandon Lopez is a very pleasant 66 y.o. male who presents today for ED follow up and to discuss depression. He is also needing a refill of his tadalifil.   1) Acute Diverticulitis: He presented to [facility] ED on 07/13/2021 for a 4-day history of lower abdominal pain, mostly on the right.  He also induced the sensation of urinary retention, loose stools.  During his visit he underwent labs which showed slight leukocytosis, "reassuring" renal function.  He underwent CT renal scan which did reveal acute diverticulitis.  Urinalysis was questionable so a culture was sent.  He was treated with an Augmentin course and was asked to follow-up with PCP.  Since his hospital visit he's feeling better. He will be finished with his Augmentin course this evening. Three evenings ago he woke up during the night with increased abdominal pain to the right lower abdomen. He took an oxycodone tablet, fell back asleep, woke up the following day his pain was gone.   He denies diarrhea, bloody stools, vomiting, fevers. He is eating and drinking well.   2) Depression: Chronic history, has waxed and waned over the years. Previously managed on bupropion for tobacco cessation treatment, was told at the time that his mood was overall better. He would like to take bupropion again.   He's been taking a mood enhancer called "5HTP" for the last 10 days for symptoms of depression that has progressed over the last month. Symptoms include feeling overwhelmed with work, feeling down/sad, hard to focus. Since starting 5HTP he's noticed a significant improvement in his mood, improved motivation to get out of bed.   He is drinking 4-5 beers daily on average which is a reduction from 1/2 pint of liquor and 6-7 beers daily. He denies negative effects from taking bupropion.     BP Readings from Last 3 Encounters:  07/23/21  (!) 140/100  07/13/21 121/74  07/13/21 118/68     Review of Systems  Constitutional:  Negative for fever.  Respiratory:  Negative for shortness of breath.   Cardiovascular:  Negative for chest pain.  Gastrointestinal:  Negative for abdominal pain, constipation and diarrhea.  Neurological:  Negative for dizziness and headaches.  Psychiatric/Behavioral:  The patient is nervous/anxious.        See HPI         Past Medical History:  Diagnosis Date   Actinic keratosis    Arthritis    Chronic systolic CHF (congestive heart failure) ([place])    a. echo 6/16: EF 55-60%, Gr1DD, LA nl, PASP nl; b. echo 7/19: EF 40-45%, unable to exclude RWMA, Gr1DD, LA nl, RVSF nl, PASP nl   COPD (chronic obstructive pulmonary disease) ([facility])    Depression    Diverticulosis    Found during colonoscopy   Hypertension    Kidney stones    PAF (paroxysmal atrial fibrillation) ([place])    a. short episodes previously noted on PPM interrogation; b. started on Xarelto 07/2017 given further episodes of Afib noted during interrogation; c. 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 Biotronik; Dayna Barker DR-T [id number]; placed 08/2012   Sleep apnea    Symptomatic bradycardia    a. s/p Biotronik; Dayna Barker DR-T [id number]; placed 08/2012 at [facility]  Social History   Socioeconomic History   Marital status: Single    Spouse name: Not on file   Number of children: Not on file   Years of education: Not on file   Highest education level: Not on file  Occupational History   Not on file  Tobacco Use   Smoking status: Former    Packs/day: 2.00    Years: 50.00    Total pack years: 100.00    Types: Cigarettes    Start date: 49    Quit date: 02/01/2021    Years since quitting: 0.4   Smokeless tobacco: Never   Tobacco comments:    started smoking at age 46, 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  Vaping Use   Vaping  Use: Never used  Substance and Sexual Activity   Alcohol use: Yes    Alcohol/week: 8.0 - 14.0 standard drinks of alcohol    Types: 8 - 14 Cans of beer per week   Drug use: Yes    Types: Marijuana   Sexual activity: Not on file  Other Topics Concern   Not on file  Social History Narrative   Not on file   Social Determinants of Health   Financial Resource Strain: Low Risk  (07/15/2021)   Overall Financial Resource Strain (CARDIA)    Difficulty of Paying Living Expenses: Not hard at all  Food Insecurity: No Food Insecurity (07/15/2021)   Hunger Vital Sign    Worried About Running Out of Food in the Last Year: Never true    Ran Out of Food in the Last Year: Never true  Transportation Needs: No Transportation Needs (07/15/2021)   PRAPARE - Hydrologist (Medical): No    Lack of Transportation (Non-Medical): No  Physical Activity: Inactive (07/15/2021)   Exercise Vital Sign    Days of Exercise per Week: 0 days    Minutes of Exercise per Session: 0 min  Stress: No Stress Concern Present (07/15/2021)   [place]    Feeling of Stress : Only a little  Social Connections: Socially Isolated (07/15/2021)   Social Connection and Isolation Panel [NHANES]    Frequency of Communication with Friends and Family: More than three times a week    Frequency of Social Gatherings with Friends and Family: Three times a week    Attends Religious Services: Never    Active Member of Clubs or Organizations: No    Attends Archivist Meetings: Never    Marital Status: Divorced  Human resources officer Violence: Not At Risk (07/15/2021)   Humiliation, Afraid, Rape, and Kick questionnaire    Fear of Current or Ex-Partner: No    Emotionally Abused: No    Physically Abused: No    Sexually Abused: No    Past Surgical History:  Procedure Laterality Date   APPENDECTOMY  age 89   CARDIAC CATHETERIZATION     7 years  ago   COLONOSCOPY N/A 05/27/2014   Procedure: COLONOSCOPY;  Surgeon: Hulen Luster, MD;  Location: [facility] ENDOSCOPY;  Service: Gastroenterology;  Laterality: N/A;   INSERT / REPLACE / REMOVE PACEMAKER     3 years ago    Family History  Problem Relation Age of Onset   Arthritis Mother    Heart disease Father    Hyperlipidemia Father    Hypertension Father    Cancer Paternal Grandfather     Allergies  Allergen Reactions   Simvastatin Diarrhea   Lisinopril Cough  Current Outpatient Medications on File Prior to Visit  Medication Sig Dispense Refill   albuterol (VENTOLIN HFA) 108 (90 Base) MCG/ACT inhaler INHALE 2 PUFFS BY MOUTH EVERY 6 HOURS AS NEEDED FOR WHEEZING AND FOR SHORTNESS OF BREATH 18 g 0   amoxicillin-clavulanate (AUGMENTIN) 875-125 MG tablet Take 1 tablet by mouth 2 (two) times daily. 20 tablet 0   aspirin EC 81 MG tablet Take 81 mg by mouth daily. Swallow whole.     atorvastatin (LIPITOR) 20 MG tablet Take 1 tablet (20 mg total) by mouth daily. For cholesterol. 90 tablet 2   carvedilol (COREG) 3.125 MG tablet Take 1 tablet by mouth twice daily 180 tablet 0   ezetimibe (ZETIA) 10 MG tablet Take 1 tablet by mouth once daily 90 tablet 2   folic acid (FOLVITE) 1 MG tablet Take 1 tablet (1 mg total) by mouth daily. 90 tablet 1   ipratropium-albuterol (DUONEB) 0.5-2.5 (3) MG/3ML SOLN Take 3 mLs by nebulization every 6 (six) hours as needed. 360 mL 3   loratadine (CLARITIN) 10 MG tablet Take 10 mg by mouth daily as needed for allergies.      meloxicam (MOBIC) 15 MG tablet Take 0.5-1 tablets (7.5-15 mg total) by mouth daily as needed for pain. 30 tablet 6   SYMBICORT 160-4.5 MCG/ACT inhaler Inhale 2 puffs into the lungs 2 (two) times daily. 33 g 0   telmisartan (MICARDIS) 20 MG tablet Take 1 tablet by mouth once daily 30 tablet 0   No current facility-administered medications on file prior to visit.    BP (!) 140/100   Pulse 70   Temp 98.2 F (36.8 C) (Oral)   Ht 5' 5.5" (1.664  m)   Wt 190 lb (86.2 kg)   SpO2 97%   BMI 31.14 kg/m  Objective:   Physical Exam Constitutional:      General: He is not in acute distress.    Appearance: He is not ill-appearing.  Cardiovascular:     Rate and Rhythm: Normal rate and regular rhythm.  Pulmonary:     Effort: Pulmonary effort is normal.     Breath sounds: Normal breath sounds. No wheezing or rales.  Abdominal:     General: Bowel sounds are normal.     Palpations: Abdomen is soft.     Tenderness: There is no abdominal tenderness.  Musculoskeletal:     Cervical back: Neck supple.  Skin:    General: Skin is warm and dry.  Neurological:     Mental Status: He is alert and oriented to person, place, and time.  Psychiatric:        Mood and Affect: Mood normal.           Assessment & Plan:   Problem List Items Addressed This Visit       Other   Anxiety and depression - Primary    Chronic, deteriorated.   Agree to resume bupropion. Start with SR 100 mg once daily x 5 days, then increase to BID thereafter. Stop 5HTP supplement.  He will update if no improvement.      Relevant Medications   buPROPion ER (WELLBUTRIN SR) 100 MG 12 hr tablet   Alcohol abuse    Commended him on cutting back.      Nocturia more than twice per night   Relevant Medications   tadalafil (CIALIS) 5 MG tablet   Erectile dysfunction   Relevant Medications   tadalafil (CIALIS) 5 MG tablet   Lower abdominal pain  Diagnosed with acute diverticulitis in ED. Reviewed ED notes, labs, imaging.  Continue Augmentin course until complete. Exam today benign.   Continue to monitor.           Pleas Koch, NP

## 2021-07-23 NOTE — Assessment & Plan Note (Signed)
Commended him on cutting back.

## 2021-07-24 DIAGNOSIS — I480 Paroxysmal atrial fibrillation: Secondary | ICD-10-CM | POA: Diagnosis not present

## 2021-07-24 DIAGNOSIS — I509 Heart failure, unspecified: Secondary | ICD-10-CM | POA: Diagnosis not present

## 2021-07-24 DIAGNOSIS — I10 Essential (primary) hypertension: Secondary | ICD-10-CM

## 2021-07-24 DIAGNOSIS — D6869 Other thrombophilia: Secondary | ICD-10-CM | POA: Diagnosis not present

## 2021-07-24 DIAGNOSIS — I110 Hypertensive heart disease with heart failure: Secondary | ICD-10-CM | POA: Diagnosis not present

## 2021-07-24 DIAGNOSIS — E785 Hyperlipidemia, unspecified: Secondary | ICD-10-CM

## 2021-07-24 DIAGNOSIS — J449 Chronic obstructive pulmonary disease, unspecified: Secondary | ICD-10-CM | POA: Diagnosis not present

## 2021-07-24 DIAGNOSIS — I4891 Unspecified atrial fibrillation: Secondary | ICD-10-CM

## 2021-07-29 ENCOUNTER — Ambulatory Visit: Payer: PPO

## 2021-07-31 ENCOUNTER — Encounter: Payer: Self-pay | Admitting: Internal Medicine

## 2021-08-01 DIAGNOSIS — J441 Chronic obstructive pulmonary disease with (acute) exacerbation: Secondary | ICD-10-CM | POA: Diagnosis not present

## 2021-08-03 ENCOUNTER — Telehealth: Payer: Self-pay | Admitting: Internal Medicine

## 2021-08-03 MED ORDER — CARVEDILOL 3.125 MG PO TABS: 3.1250 mg | ORAL_TABLET | Freq: Two times a day (BID) | ORAL | 0 refills | Status: DC

## 2021-08-03 NOTE — Telephone Encounter (Signed)
Requested Prescriptions   Signed Prescriptions Disp Refills   carvedilol (COREG) 3.125 MG tablet 180 tablet 0    Sig: Take 1 tablet (3.125 mg total) by mouth 2 (two) times daily.    Authorizing Provider: Deboraha Sprang    Ordering User: Raelene Bott, Detric Scalisi L

## 2021-08-03 NOTE — Telephone Encounter (Signed)
*  STAT* If patient is at the pharmacy, call can be transferred to refill team.   1. Which medications need to be refilled? (please list name of each medication and dose if known) carvedilol (COREG) 3.125 MG tablet  2. Which pharmacy/location (including street and city if local pharmacy) is medication to be sent to? [place], [place] [address]  3. Do they need a 30 day or 90 day supply? [place]

## 2021-08-04 ENCOUNTER — Other Ambulatory Visit: Payer: Self-pay | Admitting: Family

## 2021-08-04 DIAGNOSIS — I10 Essential (primary) hypertension: Secondary | ICD-10-CM

## 2021-08-05 ENCOUNTER — Ambulatory Visit: Payer: PPO | Admitting: Urology

## 2021-08-05 ENCOUNTER — Encounter: Payer: Self-pay | Admitting: Urology

## 2021-08-05 VITALS — BP 105/66 | HR 79 | Ht 65.5 in | Wt 190.0 lb

## 2021-08-05 DIAGNOSIS — Z125 Encounter for screening for malignant neoplasm of prostate: Secondary | ICD-10-CM | POA: Diagnosis not present

## 2021-08-05 DIAGNOSIS — R3912 Poor urinary stream: Secondary | ICD-10-CM | POA: Diagnosis not present

## 2021-08-05 DIAGNOSIS — R351 Nocturia: Secondary | ICD-10-CM

## 2021-08-05 DIAGNOSIS — R350 Frequency of micturition: Secondary | ICD-10-CM

## 2021-08-05 LAB — BLADDER SCAN AMB NON-IMAGING: Scan Result: 33

## 2021-08-05 NOTE — Progress Notes (Signed)
08/05/21 11:54 AM   Brandon Lopez 1955/09/12 [id number]  Referring provider:  Venia Carbon, MD [address] [place],  [place] [address] Chief Complaint  Patient presents with   weak urinary stream     HPI: Brandon Lopez is a 65 y.o.male who presents today for further evaluation of urinary symptoms.   He was seen by his PCP, [name], NP, on 07/23/2021. He was noted to have nocturia x2 nightly he was prescribed tadalafil for nocturia and ED.   He had a recent PSA on 12/11/2020 that was 0.26.   IPSS 29 and PVR of 28m.   He reports that he wakes up 4-5x nightly to urinate this has been ongoing for about 2-3 months. He reports that a year ago he would wake up 3x nightly. He can hold his urine and he has weak stream intermittently.   He reports that Flomax did not really improve his symptoms he tried this medication for a little over a month.    He reports that he has sleep apnea but he does not use the CPAP. He reports that he drinks beer usually after lunch about 6 beers daily.   He reports that Cialis helped with his erections.   No dysuria or gross hematuria.  Urinalysis today is negative.   IPSS     Row Name 08/05/21 1000         International Prostate Symptom Score   How often have you had the sensation of not emptying your bladder? More than half the time     How often have you had to urinate less than every two hours? Almost always     How often have you found you stopped and started again several times when you urinated? More than half the time     How often have you found it difficult to postpone urination? About half the time     How often have you had a weak urinary stream? More than half the time     How often have you had to strain to start urination? More than half the time     How many times did you typically get up at night to urinate? 5 Times     Total IPSS Score 29       Quality of Life due to urinary symptoms   If  you were to spend the rest of your life with your urinary condition just the way it is now how would you feel about that? Mixed              Score:  1-7 Mild 8-19 Moderate 20-35 Severe    PMH: Past Medical History:  Diagnosis Date   Actinic keratosis    Arthritis    Chronic systolic CHF (congestive heart failure) ([place]    a. echo 6/16: EF 55-60%, Gr1DD, LA nl, PASP nl; b. echo 7/19: EF 40-45%, unable to exclude RWMA, Gr1DD, LA nl, RVSF nl, PASP nl   COPD (chronic obstructive pulmonary disease) (HCC)    Depression    Diverticulosis    Found during colonoscopy   Hypertension    Kidney stones    PAF (paroxysmal atrial fibrillation) ([place]    a. short episodes previously noted on PPM interrogation; b. started on Xarelto 07/2017 given further episodes of Afib noted during interrogation; c. 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 Biotronik; Dayna Barker DR-T [id number]; placed 08/2012   Sleep apnea    Symptomatic bradycardia    a. s/p Biotronik; Dayna Barker DR-T [id number]; placed 08/2012 at [facility]    Surgical History: Past Surgical History:  Procedure Laterality Date   APPENDECTOMY  age 56   CARDIAC CATHETERIZATION     7 years ago   COLONOSCOPY N/A 05/27/2014   Procedure: COLONOSCOPY;  Surgeon: Hulen Luster, MD;  Location: [facility] ENDOSCOPY;  Service: Gastroenterology;  Laterality: N/A;   INSERT / REPLACE / REMOVE PACEMAKER     3 years ago    Home Medications:  Allergies as of 08/05/2021       Reactions   Simvastatin Diarrhea   Lisinopril Cough        Medication List        Accurate as of August 05, 2021 11:54 AM. If you have any questions, ask your nurse or doctor.          STOP taking these medications    amoxicillin-clavulanate 875-125 MG tablet Commonly known as: AUGMENTIN Stopped by: Hollice Espy, MD       TAKE these medications    albuterol 108  (90 Base) MCG/ACT inhaler Commonly known as: VENTOLIN HFA INHALE 2 PUFFS BY MOUTH EVERY 6 HOURS AS NEEDED FOR WHEEZING AND FOR SHORTNESS OF BREATH   aspirin EC 81 MG tablet Take 81 mg by mouth daily. Swallow whole.   atorvastatin 20 MG tablet Commonly known as: LIPITOR Take 1 tablet (20 mg total) by mouth daily. For cholesterol.   buPROPion ER 100 MG 12 hr tablet Commonly known as: Wellbutrin SR Take 1 tablet (100 mg total) by mouth 2 (two) times daily. For depression.   carvedilol 3.125 MG tablet Commonly known as: COREG Take 1 tablet (3.125 mg total) by mouth 2 (two) times daily.   ezetimibe 10 MG tablet Commonly known as: ZETIA Take 1 tablet by mouth once daily   folic acid 1 MG tablet Commonly known as: FOLVITE Take 1 tablet (1 mg total) by mouth daily.   ipratropium-albuterol 0.5-2.5 (3) MG/3ML Soln Commonly known as: DUONEB Take 3 mLs by nebulization every 6 (six) hours as needed.   loratadine 10 MG tablet Commonly known as: CLARITIN Take 10 mg by mouth daily as needed for allergies.   meloxicam 15 MG tablet Commonly known as: MOBIC Take 0.5-1 tablets (7.5-15 mg total) by mouth daily as needed for pain.   Symbicort 160-4.5 MCG/ACT inhaler Generic drug: budesonide-formoterol Inhale 2 puffs into the lungs 2 (two) times daily.   tadalafil 5 MG tablet Commonly known as: CIALIS Take 1 tablet (5 mg total) by mouth daily. For nocturia and ED.   telmisartan 20 MG tablet Commonly known as: MICARDIS Take 1 tablet by mouth once daily        Allergies:  Allergies  Allergen Reactions   Simvastatin Diarrhea   Lisinopril Cough    Family History: Family History  Problem Relation Age of Onset   Arthritis Mother    Heart disease Father    Hyperlipidemia Father    Hypertension Father    Cancer Paternal Grandfather     Social History:  reports that he quit smoking about 6 months ago. His smoking use included cigarettes. He started smoking about 59 years ago.  He has a 100.00 pack-year smoking history. He has never used smokeless tobacco. He reports current alcohol use of about 8.0 - 14.0 standard drinks of alcohol per week. He reports current  drug use. Drug: Marijuana.   Physical Exam: BP 105/66   Pulse 79   Ht 5' 5.5" (1.664 m)   Wt 190 lb (86.2 kg)   BMI 31.14 kg/m   Constitutional:  Alert and oriented, No acute distress. HEENT: [employer] AT, moist mucus membranes.  Trachea midline, no masses. Cardiovascular: No clubbing, cyanosis, or edema. Respiratory: Normal respiratory effort, no increased work of breathing. Rectal: Normal sphincter tone,  30  CC prostate, smooth no nodules Skin: No rashes, bruises or suspicious lesions. Neurologic: Grossly intact, no focal deficits, moving all 4 extremities. Psychiatric: Normal mood and affect.  Laboratory Data: Lab Results  Component Value Date   CREATININE 0.87 07/13/2021   Lab Results  Component Value Date   HGBA1C 5.8 04/21/2021    Pertinent Imaging: Results for orders placed or performed in visit on 08/05/21  BLADDER SCAN AMB NON-IMAGING  Result Value Ref Range   Scan Result 33      Assessment & Plan:    Urinary frequency/nocturia  - Discussed differential diagnosis including prostate issues versus fluid related issues based on daily fluid intake and untreated sleep apnea; this is primarily likely more of a fluid issue then prostatic in nature.  - Recommend behavioral modifications such as stopping fluid intake at least 4 hours before bedtime.  - Urged him to cut back on amount of beers he intakes and to address his sleep apnea  - Will have him follow-up as needed  -If symptoms fail to resolve with the above treatment, we can have him return for cystoscopy TRUS to further evaluate his prostate and bladder.  He is agreeable this plan. -He was reassured, prostate cancer screening updated, urinalysis negative and his PVR is minimal, no concern for any significant underlying GU  pathology  2. Prostate cancer screening  - PSA was within normal level  - DRE was reassuring     Follow-up as needed  Conley Rolls as a scribe for Hollice Espy, MD.,have documented all relevant documentation on the behalf of Hollice Espy, MD,as directed by  Hollice Espy, MD while in the presence of Hollice Espy, MD.  I have reviewed the above documentation for accuracy and completeness, and I agree with the above.   Hollice Espy, MD  I spent 46 total minutes on the day of the encounter including pre-visit review of the medical record, face-to-face time with the patient, and post visit ordering of labs/imaging/tests.    [facility] [address] [place], [place] [address] [phone]

## 2021-08-06 ENCOUNTER — Telehealth: Payer: Self-pay

## 2021-08-06 LAB — URINALYSIS, COMPLETE
Bilirubin, UA: NEGATIVE
Glucose, UA: NEGATIVE
Ketones, UA: NEGATIVE
Leukocytes,UA: NEGATIVE
Nitrite, UA: NEGATIVE
Protein,UA: NEGATIVE
RBC, UA: NEGATIVE
Specific Gravity, UA: 1.015 (ref 1.005–1.030)
Urobilinogen, Ur: 0.2 mg/dL (ref 0.2–1.0)
pH, UA: 7 (ref 5.0–7.5)

## 2021-08-06 LAB — MICROSCOPIC EXAMINATION
Bacteria, UA: NONE SEEN
RBC, Urine: NONE SEEN /hpf (ref 0–2)
WBC, UA: NONE SEEN /hpf (ref 0–5)

## 2021-08-06 NOTE — Telephone Encounter (Signed)
Patient wants to know if he can get for 90 d/s

## 2021-08-06 NOTE — Chronic Care Management (AMB) (Signed)
Chronic Care Management Pharmacy Assistant   Name: Brandon Lopez  MRN: [id number] DOB: 06/17/1955   Reason for Encounter:Hypertension  Disease State    Recent office visits:  07/23/21-Katherine Clark,NP(PCP)-hospital f/u , ongoing depression,agree to resume bupropion. Start with SR 100 mg once daily x 5 days, then increase to BID thereafter.Finish antibiotics  Recent consult visits:  08/05/21-Ashley Brandon,MD(urology)-evaluation urinary.stop amoxicillin 875/'125mg'$ . 07/13/21-Richard Letvak,MD(fam med)- abdominal pain-UA negative,observe since improving  06/26/21-James Cable,NP(fam med)-suture removal right finger laceration-no medication changes  Hospital [facility]  [facility] diverticulitis,CT scan,labs,UA,culture,start augmentin 875-'125mg'$  f/u PCP no admission  Medications: Outpatient Encounter Medications as of 08/06/2021  Medication Sig   albuterol (VENTOLIN HFA) 108 (90 Base) MCG/ACT inhaler INHALE 2 PUFFS BY MOUTH EVERY 6 HOURS AS NEEDED FOR WHEEZING AND FOR SHORTNESS OF BREATH   aspirin EC 81 MG tablet Take 81 mg by mouth daily. Swallow whole.   atorvastatin (LIPITOR) 20 MG tablet Take 1 tablet (20 mg total) by mouth daily. For cholesterol.   buPROPion ER (WELLBUTRIN SR) 100 MG 12 hr tablet Take 1 tablet (100 mg total) by mouth 2 (two) times daily. For depression.   carvedilol (COREG) 3.125 MG tablet Take 1 tablet (3.125 mg total) by mouth 2 (two) times daily.   ezetimibe (ZETIA) 10 MG tablet Take 1 tablet by mouth once daily   folic acid (FOLVITE) 1 MG tablet Take 1 tablet (1 mg total) by mouth daily.   ipratropium-albuterol (DUONEB) 0.5-2.5 (3) MG/3ML SOLN Take 3 mLs by nebulization every 6 (six) hours as needed.   loratadine (CLARITIN) 10 MG tablet Take 10 mg by mouth daily as needed for allergies.    meloxicam (MOBIC) 15 MG tablet Take 0.5-1 tablets (7.5-15 mg total) by mouth daily as needed for pain.   SYMBICORT 160-4.5 MCG/ACT inhaler Inhale 2 puffs into the  lungs 2 (two) times daily.   tadalafil (CIALIS) 5 MG tablet Take 1 tablet (5 mg total) by mouth daily. For nocturia and ED.   telmisartan (MICARDIS) 20 MG tablet Take 1 tablet by mouth once daily   No facility-administered encounter medications on file as of 08/06/2021.    Recent Office Vitals: BP Readings from Last 3 Encounters:  08/05/21 105/66  07/23/21 (!) 140/100  07/13/21 121/74   Pulse Readings from Last 3 Encounters:  08/05/21 79  07/23/21 70  07/13/21 85    Wt Readings from Last 3 Encounters:  08/05/21 190 lb (86.2 kg)  07/23/21 190 lb (86.2 kg)  07/15/21 187 lb (84.8 kg)     Kidney Function Lab Results  Component Value Date/Time   CREATININE 0.87 07/13/2021 07:05 PM   CREATININE 0.98 06/17/2021 10:55 AM   CREATININE 0.75 02/13/2014 04:35 AM   CREATININE 0.74 02/12/2014 01:54 PM   GFR 80.77 06/17/2021 10:55 AM   GFRNONAA >60 07/13/2021 07:05 PM   GFRNONAA >60 02/13/2014 04:35 AM   GFRAA >60 08/09/2017 08:38 PM   GFRAA >60 02/13/2014 04:35 AM       Latest Ref Rng & Units 07/13/2021    7:05 PM 06/17/2021   10:55 AM 05/29/2021   11:02 AM  BMP  Glucose 70 - 99 mg/dL 121  89  107   BUN 8 - 23 mg/dL '17  12  19   '$ Creatinine 0.61 - 1.24 mg/dL 0.87  0.98  1.11   Sodium 135 - 145 mmol/L 135  138  135   Potassium 3.5 - 5.1 mmol/L 3.7  3.9  3.8   Chloride 98 -  111 mmol/L 105  100  97   CO2 22 - 32 mmol/L '21  28  31   '$ Calcium 8.9 - 10.3 mg/dL 8.7  9.2  9.5      Contacted patient on 08/06/21 to discuss hypertension disease state  Current antihypertensive regimen:   Telmisartan 20 mg 1 daily Carvedilol 3.125 mg BID   Patient verbally confirms he is taking the above medications as directed. Yes  How often are you checking your Blood Pressure? infrequently  he checks his blood pressure when symptomatic he keeps BP unit  close to where he can reach it   Current home BP readings:   DATE:             BP                PULSE  08/04/21 116/62  -  07/30/21  109/64  -  07/27/21  115/60  -  Wrist or arm cuff: arm cuff  Caffeine intake: not many sodas  2 cups coffee in am  Salt intake:limits with adding to foods, will taste first  Over the counter medications including pseudoephedrine or NSAIDs? No  Any readings above 180/120? No   What recent interventions/DTPs have been made by any provider to improve Blood Pressure control since last CPP Visit:  Telemisartan has helped  BP- Patient is not taking flomax, this was no benefit to the patient discussed with urology and PCP.  Any recent hospitalizations or ED visits since last visit with CPP? Yes  07/13/21- [place] [facility] ED- diverticulitis,CT scan,labs,UA,culture,start augmentin 875-'125mg'$  f/u PCP no admission What diet changes have been made to improve Blood Pressure Control?   Patient is cooking more at home with fresh vegetables   What exercise is being done to improve your Blood Pressure Control?   Limited where he lives at the time, in apartment  Adherence Review: Is the patient currently on ACE/ARB medication? Yes Does the patient have >5 day gap between last estimated fill dates? No   Star Rating Drugs:  Medication:  Last Fill: Day Supply Atorvastatin '20mg'$  06/27/21  90 Telmisartan '20mg'$  07/14/21 30   Patient is awaiting authorization for a refill on telmisartan and would like a 90ds called into [facility] , [place].     Care Gaps: Annual wellness visit in last year? Yes Most Recent BP reading:105/66  79-P  08/05/21   Upcoming appointments: CCM appointment on 09/30/21 and Pulmonology appointment with on 08/31/21   Charlene Brooke, CPP notified  [name], [place]  [phone]

## 2021-08-31 ENCOUNTER — Ambulatory Visit: Payer: PPO | Admitting: Internal Medicine

## 2021-09-01 DIAGNOSIS — J441 Chronic obstructive pulmonary disease with (acute) exacerbation: Secondary | ICD-10-CM | POA: Diagnosis not present

## 2021-09-08 ENCOUNTER — Ambulatory Visit (INDEPENDENT_AMBULATORY_CARE_PROVIDER_SITE_OTHER): Payer: PPO

## 2021-09-08 DIAGNOSIS — I495 Sick sinus syndrome: Secondary | ICD-10-CM | POA: Diagnosis not present

## 2021-09-08 LAB — CUP PACEART REMOTE DEVICE CHECK
Date Time Interrogation Session: 20230815072110
Implantable Lead Implant Date: 20140801
Implantable Lead Implant Date: 20140801
Implantable Lead Location: 753859
Implantable Lead Location: 753860
Implantable Lead Model: 362
Implantable Lead Serial Number: 29356730
Implantable Lead Serial Number: 29392661
Implantable Pulse Generator Implant Date: 20140801
Pulse Gen Serial Number: 68053398

## 2021-09-21 ENCOUNTER — Encounter (INDEPENDENT_AMBULATORY_CARE_PROVIDER_SITE_OTHER): Payer: PPO

## 2021-09-21 DIAGNOSIS — K219 Gastro-esophageal reflux disease without esophagitis: Secondary | ICD-10-CM

## 2021-09-21 DIAGNOSIS — E538 Deficiency of other specified B group vitamins: Secondary | ICD-10-CM | POA: Diagnosis not present

## 2021-09-22 NOTE — Telephone Encounter (Signed)
Please see the MyChart message reply(ies) for my assessment and plan.  The patient gave consent for this Medical Advice Message and is aware that it may result in a bill to their insurance company as well as the possibility that this may result in a co-payment or deductible. They are an established patient, but are not seeking medical advice exclusively about a problem treated during an in person or video visit in the last 7 days. I did not recommend an in person or video visit within 7 days of my reply.  I spent a total of 10 minutes cumulative time within 7 days through [place], NP

## 2021-09-25 ENCOUNTER — Telehealth: Payer: Self-pay

## 2021-09-25 NOTE — Chronic Care Management (AMB) (Signed)
    Chronic Care Management Pharmacy Assistant   Name: Brandon Lopez  MRN: [id number] DOB: 1955-04-02  Reason for Encounter: Reminder Call   Hospital visits:  None in previous 6 months 07/13/21-Brandon Paduchowski,MD([facility] ED)- diverticulitis,labs,CT scan, We will cover with Augmentin.-no admission   Medications: Outpatient Encounter Medications as of 09/25/2021  Medication Sig   albuterol (VENTOLIN HFA) 108 (90 Base) MCG/ACT inhaler INHALE 2 PUFFS BY MOUTH EVERY 6 HOURS AS NEEDED FOR WHEEZING AND FOR SHORTNESS OF BREATH   aspirin EC 81 MG tablet Take 81 mg by mouth daily. Swallow whole.   atorvastatin (LIPITOR) 20 MG tablet Take 1 tablet (20 mg total) by mouth daily. For cholesterol.   buPROPion ER (WELLBUTRIN SR) 100 MG 12 hr tablet Take 1 tablet (100 mg total) by mouth 2 (two) times daily. For depression.   carvedilol (COREG) 3.125 MG tablet Take 1 tablet (3.125 mg total) by mouth 2 (two) times daily.   ezetimibe (ZETIA) 10 MG tablet Take 1 tablet by mouth once daily   folic acid (FOLVITE) 1 MG tablet Take 1 tablet (1 mg total) by mouth daily.   ipratropium-albuterol (DUONEB) 0.5-2.5 (3) MG/3ML SOLN Take 3 mLs by nebulization every 6 (six) hours as needed.   loratadine (CLARITIN) 10 MG tablet Take 10 mg by mouth daily as needed for allergies.    meloxicam (MOBIC) 15 MG tablet Take 0.5-1 tablets (7.5-15 mg total) by mouth daily as needed for pain.   SYMBICORT 160-4.5 MCG/ACT inhaler Inhale 2 puffs into the lungs 2 (two) times daily.   tadalafil (CIALIS) 5 MG tablet Take 1 tablet (5 mg total) by mouth daily. For nocturia and ED.   telmisartan (MICARDIS) 20 MG tablet Take 1 tablet (20 mg total) by mouth daily. for blood pressure.   No facility-administered encounter medications on file as of 09/25/2021.   Brandon Lopez was contacted to remind of upcoming telephone visit with Charlene Brooke on 09/30/21 at 3:00pm. Patient was reminded to have any blood glucose and blood  pressure readings available for review at appointment.   Patient confirmed appointment.  Are you having any problems with your medications? No   Do you have any concerns you like to discuss with the pharmacist? No  CCM referral has been placed prior to visit?  Yes   Star Rating Drugs: Medication:  Last Fill: Day Supply Atorvastatin '20mg'$  06/27/21  90 Telmisartan '20mg'$  08/07/21 90  Summary: CCM F/U visit -Reviewed medications; pt affirms compliance with all meds EXCEPT tamsulosin which ran out a month ago; he is not sure if it was helping or not as he had a lot of other health issues going on last month; he would like to retry tamsulosin and needs a refill -BP 118/75 at home, pt reports fatigue improved with lower dose BP med, folic acid and X51 supplementation -Pt lives in apartment and reports he is less active without yardwork to do; he does have access to a community gym that he has not explored yet; reviewed benefits of exercise to help with overall energy levels (among other health benefits)   Recommendations/Changes made from today's visit: -Advised to restart tamsulosin, monitor for improvement in urine flow -Encouraged exercise to improve energy  Charlene Brooke, CPP notified  [name], [place]  [phone]

## 2021-09-30 ENCOUNTER — Ambulatory Visit (INDEPENDENT_AMBULATORY_CARE_PROVIDER_SITE_OTHER): Payer: PPO | Admitting: Pharmacist

## 2021-09-30 DIAGNOSIS — G4733 Obstructive sleep apnea (adult) (pediatric): Secondary | ICD-10-CM

## 2021-09-30 DIAGNOSIS — I480 Paroxysmal atrial fibrillation: Secondary | ICD-10-CM

## 2021-09-30 DIAGNOSIS — J449 Chronic obstructive pulmonary disease, unspecified: Secondary | ICD-10-CM

## 2021-09-30 DIAGNOSIS — F419 Anxiety disorder, unspecified: Secondary | ICD-10-CM

## 2021-09-30 DIAGNOSIS — I10 Essential (primary) hypertension: Secondary | ICD-10-CM

## 2021-09-30 DIAGNOSIS — E785 Hyperlipidemia, unspecified: Secondary | ICD-10-CM

## 2021-09-30 NOTE — Progress Notes (Signed)
Chronic Care Management Pharmacy Note  10/07/2021 Name:  Brandon Lopez MRN:  [id number] DOB:  Dec 26, 1955  Summary: CCM F/U visit -Reviewed medications; pt affirms compliance with all meds -Pt was previously referred to ENT (05/2021) to evaluate for Inspire device for OSA. Pt has not seen ENT yet   Recommendations/Changes made from today's visit: -Check on ENT referral/appt - sent to [facility]   Follow Up Plan: -CCM Health Concierge will call patient re: ENT appt -Pharmacist follow up televisit scheduled for 6 months -PCP annual due 11/2021, not scheduled   Subjective: Brandon Lopez is an 66 y.o. year old male who is a primary patient of Doreene Nest, NP.  The CCM team was consulted for assistance with disease management and care coordination needs.    Engaged with patient by telephone for follow up visit in response to provider referral for pharmacy case management and/or care coordination services.   Consent to Services:  The patient was given information about Chronic Care Management services, agreed to services, and gave verbal consent prior to initiation of services.  Please see initial visit note for detailed documentation.   Patient Care Team: Doreene Nest, NP as PCP - General (Internal Medicine) Mamie Nick, DC as Referring Physician (Chiropractic Medicine) Hannah Beat, MD as Consulting Physician (Family Medicine) Kathyrn Sheriff, [facility] as Pharmacist (Pharmacist)  Recent office visits: 07/23/21 NP Mayra Reel OV: f/u depression. Start bupropion SR 100 mg, then BID. Stop 5HTP supplement.  07/13/21 Dr Alphonsus Sias OV: lower abd pain - UA negative. Concern for incomplete emptying - referred to urology.  06/26/21 NP Matt Toney Reil OV: remove sutures  06/17/21 NP Tabitha Dugal OV: BP check. 100/56. D/C bupropion (completed course) and Telmisartan-HCTZ - switch to plain telmisartan 20 mg. B12 very low (148), start B12 injections. Folate low, start folic  acid 1 mg.  06/02/21 NP Mayra Reel OV: nocturia. Resume tamsulosin. Continue tadalafil.  05/07/21 pt message - started Tamsulosin 0.4 mg for bladder emptying.   04/21/21 NP Mordecai Maes OV: c/o SOB, fatigue. Labs normal. Advised f/u with pulmonary, cardiology.  04/09/21 NP Mayra Reel OV: c/o ED, nocturia. Rx'd tadalafil 5 mg daily.  Recent consult visits: 08/05/21 Dr Apolinar Junes (Urology): weak urinary stream. Likely fluid issue rather than prostate issue and untreated OSA. Advised to reduce beer, stop fluids 4 hrs before bedtime. Address OSA.  05/29/21 Dr Maple Hudson (Pulmonary): COPD, OSA. Refer to ENT for Inspire option. Acute COPD exacerbation, likely viral. Ordered home nebulizer/Duoneb.  04/28/21 Dr Graciela Husbands (Cardiology): f/u sick sinus syndrome, pacemaker. No med changes.   04/21/21 Dr Mariah Milling (Cardiology): f/u Afib. No med changes.  Hospital visits: 07/13/21 ED visit [facility]): diverticulitis via CT. Rx'd Augmentin.  06/15/21 ED visit [facility]): laceration of R ring finger (table saw). Rx'd Keflex and Oxycodone 5 mg.   Objective:  Lab Results  Component Value Date   CREATININE 0.87 07/13/2021   BUN 17 07/13/2021   GFR 80.77 06/17/2021   GFRNONAA >60 07/13/2021   GFRAA >60 08/09/2017   NA 135 07/13/2021   K 3.7 07/13/2021   CALCIUM 8.7 (L) 07/13/2021   CO2 21 (L) 07/13/2021   GLUCOSE 121 (H) 07/13/2021    Lab Results  Component Value Date/Time   HGBA1C 5.8 04/21/2021 11:33 AM   HGBA1C 5.6 01/11/2020 12:27 PM   GFR 80.77 06/17/2021 10:55 AM   GFR 69.58 05/29/2021 11:02 AM    Last diabetic Eye exam: No results found for: "HMDIABEYEEXA"  Last diabetic Foot exam: No  results found for: "HMDIABFOOTEX"   Lab Results  Component Value Date   CHOL 145 12/11/2020   HDL 46.20 12/11/2020   LDLCALC 72 12/11/2020   TRIG 136.0 12/11/2020   CHOLHDL 3 12/11/2020       Latest Ref Rng & Units 07/13/2021    7:05 PM 04/21/2021   11:33 AM 12/11/2020   11:45 AM  Hepatic Function  Total Protein 6.5 - 8.1  g/dL 7.1  7.0  6.6   Albumin 3.5 - 5.0 g/dL 3.9  4.4  4.2   AST 15 - 41 U/L $Remo'15  17  21   'vtroL$ ALT 0 - 44 U/L $Remo'19  24  29   'DMzhp$ Alk Phosphatase 38 - 126 U/L 53  73  82   Total Bilirubin 0.3 - 1.2 mg/dL 1.2  0.7  0.7     Lab Results  Component Value Date/Time   TSH 2.67 06/17/2021 10:55 AM   TSH 3.19 04/21/2021 11:33 AM       Latest Ref Rng & Units 07/13/2021    7:05 PM 06/17/2021   10:55 AM 05/29/2021   11:02 AM  CBC  WBC 4.0 - 10.5 K/uL 11.1  6.7  7.5   Hemoglobin 13.0 - 17.0 g/dL 15.0  14.7  15.9   Hematocrit 39.0 - 52.0 % 43.8  43.2  46.3   Platelets 150 - 400 K/uL 238  246.0  193.0     No results found for: "VD25OH"  Clinical ASCVD: No  The 10-year ASCVD risk score (Arnett DK, et al., 2019) is: 13%   Values used to calculate the score:     Age: 36 years     Sex: Male     Is Non-Hispanic African American: No     Diabetic: No     Tobacco smoker: Yes     Systolic Blood Pressure: 695 mmHg     Is BP treated: Yes     HDL Cholesterol: 46.2 mg/dL     Total Cholesterol: 145 mg/dL       07/15/2021    8:12 AM 07/14/2020    8:14 AM 01/11/2020   11:47 AM  Depression screen PHQ 2/9  Decreased Interest 0 0 0  Down, Depressed, Hopeless 0 0 0  PHQ - 2 Score 0 0 0  Altered sleeping 1 0   Tired, decreased energy 1 0   Change in appetite 1 0   Feeling bad or failure about yourself  0 0   Trouble concentrating 0 0   Moving slowly or fidgety/restless 0 0   Suicidal thoughts 0 0   PHQ-9 Score 3 0   Difficult doing work/chores Not difficult at all Not difficult at all     CHA2DS2/VAS Stroke Risk Points  Current as of yesterday     4 >= 2 Points: High Risk  1 - 1.99 Points: Medium Risk  0 Points: Low Risk    Last Change:       Points Metrics  1 Has Congestive Heart Failure:  Yes    Current as of yesterday  1 Has Vascular Disease:  Yes     Current as of yesterday  1 Has Hypertension:  Yes    Current as of yesterday  1 Age:  56    Current as of yesterday  0 Has Diabetes:  No     Current as of yesterday  0 Had Stroke:  No  Had TIA:  No  Had Thromboembolism:  No    Current as  of yesterday  0 Male:  No    Current as of yesterday   Social History   Tobacco Use  Smoking Status Former   Packs/day: 2.00   Years: 50.00   Total pack years: 100.00   Types: Cigarettes   Start date: 1964   Quit date: 02/01/2021   Years since quitting: 0.6  Smokeless Tobacco Never  Tobacco Comments   started smoking at age 74, 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   BP Readings from Last 3 Encounters:  08/05/21 105/66  07/23/21 (!) 140/100  07/13/21 121/74   Pulse Readings from Last 3 Encounters:  08/05/21 79  07/23/21 70  07/13/21 85   Wt Readings from Last 3 Encounters:  08/05/21 190 lb (86.2 kg)  07/23/21 190 lb (86.2 kg)  07/15/21 187 lb (84.8 kg)   BMI Readings from Last 3 Encounters:  08/05/21 31.14 kg/m  07/23/21 31.14 kg/m  07/15/21 30.65 kg/m    Assessment/Interventions: Review of patient past medical history, allergies, medications, health status, including review of consultants reports, laboratory and other test data, was performed as part of comprehensive evaluation and provision of chronic care management services.   SDOH:  (Social Determinants of Health) assessments and interventions performed: No - done June 2022, AWV upcoming this month SDOH Interventions    Flowsheet Row Clinical Support from 07/15/2021 in [facility] Chronic Care Management from 01/27/2021 in [facility] Clinical Support from 07/14/2020 in [facility] Clinical Support from 12/11/2018 in [facility]  SDOH Interventions      Food Insecurity Interventions Intervention Not Indicated -- -- --  Housing Interventions Intervention Not Indicated -- -- --  Transportation Interventions Intervention Not Indicated -- -- --  Depression Interventions/Treatment  -- -- PHQ2-9 Score <4 Follow-up Not Indicated PHQ2-9 Score  <4 Follow-up Not Indicated  Financial Strain Interventions Intervention Not Indicated Intervention Not Indicated -- --  Physical Activity Interventions Patient Refused -- -- --  Stress Interventions Intervention Not Indicated -- -- --  Social Connections Interventions Intervention Not Indicated -- -- --      SDOH Screenings   Food Insecurity: No Food Insecurity (07/15/2021)  Housing: Low Risk  (07/15/2021)  Transportation Needs: No Transportation Needs (07/15/2021)  Alcohol Screen: Low Risk  (07/15/2021)  Depression (PHQ2-9): Low Risk  (07/15/2021)  Financial Resource Strain: Low Risk  (07/15/2021)  Physical Activity: Inactive (07/15/2021)  Social Connections: Socially Isolated (07/15/2021)  Stress: No Stress Concern Present (07/15/2021)  Tobacco Use: Medium Risk (08/05/2021)    CCM Care Plan  Allergies  Allergen Reactions   Simvastatin Diarrhea   Lisinopril Cough    Medications Reviewed Today     Reviewed by Kathyrn Sheriff, [facility] (Pharmacist) on 10/07/21 at 1645  Med List Status: <None>   Medication Order Taking? Sig Documenting Provider Last Dose Status Informant  albuterol (VENTOLIN HFA) 108 (90 Base) MCG/ACT inhaler [id number] Yes INHALE 2 PUFFS BY MOUTH EVERY 6 HOURS AS NEEDED FOR WHEEZING AND FOR SHORTNESS OF Ellsworth Lennox, MD Taking Active   aspirin EC 81 MG tablet [id number] Yes Take 81 mg by mouth daily. Swallow whole. [provider] Taking Active   atorvastatin (LIPITOR) 20 MG tablet [id number] Yes Take 1 tablet (20 mg total) by mouth daily. For cholesterol. Doreene Nest, NP Taking Active   buPROPion ER [facility] SR) 100 MG 12 hr tablet [id number] Yes Take 1 tablet (100 mg total) by mouth 2 (two) times daily. For depression.  Patient taking differently: Take 100 mg by mouth daily. For depression.   Pleas Koch, NP Taking Active   carvedilol (COREG) 3.125 MG tablet [id number] Yes Take 1 tablet (3.125 mg total) by mouth 2 (two) times daily.  Deboraha Sprang, MD Taking Active   ezetimibe (ZETIA) 10 MG tablet [id number] Yes Take 1 tablet by mouth once daily Gollan, Kathlene November, MD Taking Active   folic acid (FOLVITE) 1 MG tablet [id number] Yes Take 1 tablet (1 mg total) by mouth daily. Eugenia Pancoast, FNP Taking Active   ipratropium-albuterol (DUONEB) 0.5-2.5 (3) MG/3ML SOLN [id number] Yes Take 3 mLs by nebulization every 6 (six) hours as needed. Deneise Lever, MD Taking Active   loratadine (CLARITIN) 10 MG tablet [id number] Yes Take 10 mg by mouth daily as needed for allergies.  [provider] Taking Active Self  meloxicam (MOBIC) 15 MG tablet [id number] Yes Take 0.5-1 tablets (7.5-15 mg total) by mouth daily as needed for pain. Hilts, Legrand Como, MD Taking Active   SYMBICORT 160-4.5 MCG/ACT inhaler [id number] Yes Inhale 2 puffs into the lungs 2 (two) times daily. Pleas Koch, NP Taking Active   tadalafil (CIALIS) 5 MG tablet [id number] Yes Take 1 tablet (5 mg total) by mouth daily. For nocturia and ED. Pleas Koch, NP Taking Active   telmisartan (MICARDIS) 20 MG tablet [id number] Yes Take 1 tablet (20 mg total) by mouth daily. for blood pressure. 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   Laceration of right ring finger with damage to nail without foreign body 06/17/2021   Hyperglycemia 06/17/2021   Hypotension due to drugs 06/17/2021   Nocturia 06/17/2021   DOE (dyspnea on exertion) 06/17/2021   Weak urinary stream 06/02/2021   Asbestos exposure 05/29/2021   Other chest pain 04/21/2021   Shortness of breath 04/21/2021   Lightheadedness 04/21/2021   Other fatigue 04/21/2021   Nocturia more than twice per night 04/09/2021   Erectile dysfunction 04/09/2021   Skin lesions 07/03/2020   Chronic back pain 08/17/2019   Chronic shoulder  pain 12/12/2018   Rectal bleeding 09/19/2018   Paroxysmal atrial fibrillation ([place]) 10/31/2017   Syncope 08/10/2017   Osteoarthritis 03/17/2017   Edema 07/21/2016   Cigarette smoker 11/17/2015   Diverticulosis 12/16/2014   COPD exacerbation ([place]) 11/26/2014   Cervical pain (neck) 10/14/2014   Hyperlipidemia 07/11/2014   Preventative health care 07/11/2014   Pacemaker 06/03/2014   Anxiety and depression 06/03/2014   Alcohol abuse 06/03/2014   OSA (obstructive sleep apnea) 06/03/2014   Essential hypertension 06/03/2014   Tobacco dependence 06/03/2014    Immunization History  Administered Date(s) Administered   Fluad Quad(high Dose 65+) 12/11/2020   Influenza,inj,Quad PF,6+ Mos 11/15/2014, 11/05/2015, 12/23/2016, 12/06/2017, 12/12/2018, 01/11/2020   PNEUMOCOCCAL CONJUGATE-20 12/11/2020   Pneumococcal Polysaccharide-23 11/04/2014   Td 10/26/2011   Tdap 06/15/2021   Zoster, Live 11/18/2015    Conditions to be addressed/monitored:  Hypertension, Hyperlipidemia, Atrial Fibrillation, and COPD, Weak urinary flow  Care Plan : [place]  Updates made by Charlton Haws, [place] since 10/07/2021 12:00 AM     Problem: Hypertension, Hyperlipidemia, Atrial Fibrillation, COPD, and Weak urinary flow   Priority: High     Long-Range Goal: Disease mgmt   Start Date: 01/27/2021  Expected End Date: 07/01/2022  This Visit's Progress: On track  Recent Progress: On track  Priority: High  Note:   Current Barriers:  Does not always contact provider office for questions/concerns  Pharmacist Clinical Goal(s):  Patient will contact provider office for questions/concerns as evidenced notation of same in electronic health record through collaboration with PharmD and provider.   Interventions: 1:1 collaboration with Pleas Koch, NP regarding development and update of comprehensive plan of care as evidenced by provider attestation and co-signature Inter-disciplinary care team  collaboration (see longitudinal plan of care) Comprehensive medication review performed; medication list updated in electronic medical record   Hypertension (BP goal <140/90) -Controlled - BP at goal at home, not too low after switching to telmisartan (plain) -Current home readings: 118/75 -Hx OSA; did not tolerate CPAP; referred to ENT for [facility] consult -Current treatment: Telmisartan 20 mg daily- Appropriate, Effective, Safe, Accessible Carvedilol 3.125 mg BID- Appropriate, Effective, Safe, Accessible -Medications previously tried: lisinopril (cough), HCTZ -Current dietary habits: no recent changes to diet/lifestyle -Current exercise habits: none formal; he lives in an apartment with community center and has not gone to the gym yet, he is planning to check it out soon -Counseled to monitor BP at home periodically -Recommended to continue current medication; follow up on ENT referral   Hyperlipidemia: (LDL goal < 70) -Controlled - LDL 72 (11/2020), unable to tolerate higher doses of atorvastatin -Aortic atherosclerosis on CT -Current treatment: Atorvastatin 20 mg daily - Appropriate, Effective, Safe, Accessible Ezetimibe 10 mg daily - Appropriate, Effective, Safe, Accessible Aspirin 81 mg daily - Appropriate, Effective, Safe, Accessible -Medications previously tried: simvastatin  -Educated on Cholesterol goals;  -Recommended to continue current medication   Atrial Fibrillation (Goal: prevent stroke and major bleeding) -Controlled -Follows with Dr Rockey Situ, Dr Caryl Comes; original Afib episode < 12 hrs, per pacemaker Afib burden is low; he is off anticoagulation -CHADSVASC: 4 -Current treatment: Carvedilol 3.125 mg BID - Appropriate, Effective, Safe, Accessible Aspirin 81 mg daily - Appropriate, Effective, Safe, Accessible -Medications previously tried: Xarelto -Reviewed afib history, it is reasonable to continue without anticoagulation per cardiology -Recommended to continue current  medication  COPD (Goal: control symptoms and prevent exacerbations) -Controlled - pt reports he has not needed rescue inhaler, duonebs; doing well with daily use of Symbicort -Quit smoking Jan 2023! -Gold Grade: Gold 1 (FEV1>80%) -Current COPD Classification:  A (low sx, <2 exacerbations/yr) -MMRC/CAT score: none  -Pulmonary function testing: 06/19/19 FEV1 85% -Exacerbations requiring treatment in last 6 months: 1  -Current treatment  Albuterol HFA - 2 puffs every 6 hours PRN - Appropriate, Effective, Safe, Accessible Symbicort 160-4.5 mcg/act - 2 puffs BID- Appropriate, Effective, Safe, Accessible Duoneb PRN - Appropriate, Effective, Safe, Accessible -Medications previously tried: none reported   -Patient reports consistent use of maintenance inhaler -Frequency of rescue inhaler use: very rare -Counseled on Differences between maintenance and rescue inhalers -Recommended to continue current medication   Weak urine flow / Nocturia(Goal: manage symptoms) -Controlled - PSA 0.26 (11/2020) normal -Current treatment  Tadalafil 5 mg HS - Appropriate, Effective, Safe, Accessible -Medications previously tried: tamsulosin -Recommend to continue current medication  Depression/Anxiety (Goal: manage symptoms) -Controlled - taking once a day due to concern for heartburn with BID dosing -PHQ9: 3 (06/2021) -Connected with PCP for mental health support -Current treatment: Bupropion SR 100 mg daily - Appropriate, Effective, Safe, Accessible -Medications previously tried/failed: n/a -Educated on Benefits of medication for symptom control -Recommended to continue current medication  Health Maintenance -Vaccine gaps: Shingrix -MISC meds:  Folic acid 1 mg daily Vitamin B12 injections Loratadine 10 mg  Meloxicam 15 mg Famotidine 20 mg daily -Pt affirms compliance with new folic acid and G84 injections, mild improvement in fatigue/energy so far   Patient Goals/Self-Care Activities Patient will:   - take medications as prescribed as evidenced by patient report and record review focus on medication adherence by routine check blood pressure 2-3x weekly, document, and provide at future appointments target a minimum of 150 minutes of moderate intensity exercise weekly      Medication Assistance: None required.  Patient affirms current coverage meets needs.  Compliance/Adherence/Medication fill history: Care Gaps: None  Star-Rating Drugs: Atorvastatin - PDC 94% Telmisartan - PDC 100%  Medication Access: Within the past 30 days, how often has patient missed a dose of medication? 0 Is a pillbox or other method used to improve adherence? Yes  Factors that may affect medication adherence? lack of understanding of disease management (BPH - not taking tamsulosin, unsure it is helping) Are meds synced by current pharmacy? No  Are meds delivered by current pharmacy? No  Does patient experience delays in picking up medications due to transportation concerns? No   Upstream Services Reviewed: Is patient disadvantaged to use [facility]?: No  Current Rx insurance plan: HTA Name and location of Current pharmacy:  [facility], [address] [place] [address] Phone: [phone] Fax: [phone]  [facility] Pharm[facility] services reviewed with patient today?: No  Reason patient declined to change pharmacies: Not mentioned at this visit   Care Plan and Follow Up Patient Decision:  Patient agrees to Care Plan and Follow-up.  Plan: Telephone follow up appointment with care management team member scheduled for:  6 months  Charlene Brooke, PharmD, BCACP Clinical Pharmacist [facility] [phone]

## 2021-10-02 DIAGNOSIS — J441 Chronic obstructive pulmonary disease with (acute) exacerbation: Secondary | ICD-10-CM | POA: Diagnosis not present

## 2021-10-07 NOTE — Patient Instructions (Signed)
Visit Information  Phone number for Pharmacist: [phone]   Goals Addressed   None     Care Plan : [place]  Updates made by Charlton Haws, RPH since 10/07/2021 12:00 AM     Problem: Hypertension, Hyperlipidemia, Atrial Fibrillation, COPD, and Weak urinary flow   Priority: High     Long-Range Goal: Disease mgmt   Start Date: 01/27/2021  Expected End Date: 07/01/2022  This Visit's Progress: On track  Recent Progress: On track  Priority: High  Note:   Current Barriers:  Does not always contact provider office for questions/concerns  Pharmacist Clinical Goal(s):  Patient will contact provider office for questions/concerns as evidenced notation of same in electronic health record through collaboration with PharmD and provider.   Interventions: 1:1 collaboration with Pleas Koch, NP regarding development and update of comprehensive plan of care as evidenced by provider attestation and co-signature Inter-disciplinary care team collaboration (see longitudinal plan of care) Comprehensive medication review performed; medication list updated in electronic medical record   Hypertension (BP goal <140/90) -Controlled - BP at goal at home, not too low after switching to telmisartan (plain) -Current home readings: 118/75 -Hx OSA; did not tolerate CPAP; referred to ENT for [facility] consult -Current treatment: Telmisartan 20 mg daily- Appropriate, Effective, Safe, Accessible Carvedilol 3.125 mg BID- Appropriate, Effective, Safe, Accessible -Medications previously tried: lisinopril (cough), HCTZ -Current dietary habits: no recent changes to diet/lifestyle -Current exercise habits: none formal; he lives in an apartment with community center and has not gone to the gym yet, he is planning to check it out soon -Counseled to monitor BP at home periodically -Recommended to continue current medication; follow up on ENT referral   Hyperlipidemia: (LDL goal <  70) -Controlled - LDL 72 (11/2020), unable to tolerate higher doses of atorvastatin -Aortic atherosclerosis on CT -Current treatment: Atorvastatin 20 mg daily - Appropriate, Effective, Safe, Accessible Ezetimibe 10 mg daily - Appropriate, Effective, Safe, Accessible Aspirin 81 mg daily - Appropriate, Effective, Safe, Accessible -Medications previously tried: simvastatin  -Educated on Cholesterol goals;  -Recommended to continue current medication   Atrial Fibrillation (Goal: prevent stroke and major bleeding) -Controlled -Follows with Dr Rockey Situ, Dr Caryl Comes; original Afib episode < 12 hrs, per pacemaker Afib burden is low; he is off anticoagulation -CHADSVASC: 4 -Current treatment: Carvedilol 3.125 mg BID - Appropriate, Effective, Safe, Accessible Aspirin 81 mg daily - Appropriate, Effective, Safe, Accessible -Medications previously tried: Xarelto -Reviewed afib history, it is reasonable to continue without anticoagulation per cardiology -Recommended to continue current medication  COPD (Goal: control symptoms and prevent exacerbations) -Controlled - pt reports he has not needed rescue inhaler, duonebs; doing well with daily use of Symbicort -Quit smoking Jan 2023! -Gold Grade: Gold 1 (FEV1>80%) -Current COPD Classification:  A (low sx, <2 exacerbations/yr) -MMRC/CAT score: none  -Pulmonary function testing: 06/19/19 FEV1 85% -Exacerbations requiring treatment in last 6 months: 1  -Current treatment  Albuterol HFA - 2 puffs every 6 hours PRN - Appropriate, Effective, Safe, Accessible Symbicort 160-4.5 mcg/act - 2 puffs BID- Appropriate, Effective, Safe, Accessible Duoneb PRN - Appropriate, Effective, Safe, Accessible -Medications previously tried: none reported   -Patient reports consistent use of maintenance inhaler -Frequency of rescue inhaler use: very rare -Counseled on Differences between maintenance and rescue inhalers -Recommended to continue current medication   Weak urine  flow / Nocturia(Goal: manage symptoms) -Controlled - PSA 0.26 (11/2020) normal -Current treatment  Tadalafil 5 mg HS - Appropriate, Effective, Safe, Accessible -Medications previously tried: tamsulosin -Recommend  to continue current medication  Depression/Anxiety (Goal: manage symptoms) -Controlled - taking once a day due to concern for heartburn with BID dosing -PHQ9: 3 (06/2021) -Connected with PCP for mental health support -Current treatment: Bupropion SR 100 mg daily - Appropriate, Effective, Safe, Accessible -Medications previously tried/failed: n/a -Educated on Benefits of medication for symptom control -Recommended to continue current medication  Health Maintenance -Vaccine gaps: Shingrix -MISC meds: Folic acid 1 mg daily Vitamin B12 injections Loratadine 10 mg  Meloxicam 15 mg Famotidine 20 mg daily -Pt affirms compliance with new folic acid and X03 injections, mild improvement in fatigue/energy so far   Patient Goals/Self-Care Activities Patient will:  - take medications as prescribed as evidenced by patient report and record review focus on medication adherence by routine check blood pressure 2-3x weekly, document, and provide at future appointments target a minimum of 150 minutes of moderate intensity exercise weekly       Patient verbalizes understanding of instructions and care plan provided today and agrees to view in [place]. Active MyChart status and patient understanding of how to access instructions and care plan via MyChart confirmed with patient.    Telephone follow up appointment with pharmacy team member scheduled for: 6 months  Charlene Brooke, PharmD, [facility] Clinical Pharmacist [facility] [phone]

## 2021-10-09 ENCOUNTER — Telehealth: Payer: Self-pay

## 2021-10-09 NOTE — Telephone Encounter (Signed)
-----   Message from Charlton Haws, [facility] sent at 10/07/2021  4:52 PM EDT ----- Regarding: call pt I noticed patient was referred to ENT (ear/nose/throat) so the can evaluate him for Inspire device for sleep apnea, did he ever make this appt/hear from [facility] ENT?  Addressing sleep apnea may help with his overall energy levels.

## 2021-10-09 NOTE — Progress Notes (Signed)
Remote pacemaker transmission.   

## 2021-10-09 NOTE — Chronic Care Management (AMB) (Signed)
Left message for the patient that I was following up on ENT referral that was made in May 2023 to discuss Inspire device, left Dr.Bates # [phone], for the patient to make appointment.  Charlene Brooke, CPP notified  [name], [place]  [phone]

## 2021-10-11 ENCOUNTER — Telehealth: Payer: Self-pay | Admitting: Primary Care

## 2021-10-11 DIAGNOSIS — F32A Depression, unspecified: Secondary | ICD-10-CM

## 2021-10-11 NOTE — Telephone Encounter (Signed)
Patient is due for CPE/follow up in December 2023, this will be required prior to any further refills.  Please schedule.   

## 2021-10-12 NOTE — Telephone Encounter (Signed)
Called pt & scheduled cpe for 01/12/22

## 2021-10-15 DIAGNOSIS — Z6832 Body mass index (BMI) 32.0-32.9, adult: Secondary | ICD-10-CM | POA: Diagnosis not present

## 2021-10-15 DIAGNOSIS — G4733 Obstructive sleep apnea (adult) (pediatric): Secondary | ICD-10-CM | POA: Diagnosis not present

## 2021-10-18 ENCOUNTER — Other Ambulatory Visit: Payer: Self-pay | Admitting: Primary Care

## 2021-10-18 DIAGNOSIS — R351 Nocturia: Secondary | ICD-10-CM

## 2021-10-18 DIAGNOSIS — N529 Male erectile dysfunction, unspecified: Secondary | ICD-10-CM

## 2021-10-19 ENCOUNTER — Telehealth: Payer: Self-pay

## 2021-10-19 NOTE — Telephone Encounter (Signed)
Prior auth started for Tadalafil '5MG'$  tablets. [facility] Key: [place] Case ID: [id number] - Rx #: [id number] Waiting for determination.

## 2021-10-20 ENCOUNTER — Other Ambulatory Visit: Payer: Self-pay | Admitting: Primary Care

## 2021-10-20 DIAGNOSIS — R351 Nocturia: Secondary | ICD-10-CM

## 2021-10-20 DIAGNOSIS — N401 Enlarged prostate with lower urinary tract symptoms: Secondary | ICD-10-CM

## 2021-10-20 DIAGNOSIS — N529 Male erectile dysfunction, unspecified: Secondary | ICD-10-CM

## 2021-10-20 NOTE — Telephone Encounter (Signed)
[name], please resubmit the PA under the diagnosis BPH. Let me know if I need to send in a prescription.  Vida Roller, notify patient we will try to rerun the prescription under different diagnosis code.  Hopefully we can get it covered.

## 2021-10-20 NOTE — Telephone Encounter (Signed)
Called and notified patient that the prior auth for medication was denied.

## 2021-10-20 NOTE — Telephone Encounter (Signed)
Prior auth for Tadalafil 5MG tablets has been denied. Brandon Lopez Key: [id number] - [place] Case ID: [id number] - Rx #: [id number] We denied this request under Medicare Part D because:  Utilization management rules not met. Part D sponsors are responsible for ensuring that covered Part D drugs are being prescribed for  "medically-accepted indications": (those approved by the Food and Drug Administration (FDA). The requested drug must meet the following therapeutic criteria:   1) Diagnosis of benign prostatic hyperplasia (BPH) 2) Trial and failure, contraindication, or intolerance to an alpha-blocker (e.g., doxazosin, prazosin, tamsulosin) or a 5-alpha reductase inhibitor (e.g., dutasteride, finasteride)  Denial letter sent to scanning. 

## 2021-10-20 NOTE — Telephone Encounter (Signed)
Called and notified patient.

## 2021-10-20 NOTE — Telephone Encounter (Signed)
Received denial of prior authorization for medication (See phone note in chart).  Notified patient that medication was denied.

## 2021-10-20 NOTE — Telephone Encounter (Signed)
New Rx sent to pharmacy with new diagnosis code.  See phone note.

## 2021-10-21 NOTE — Telephone Encounter (Signed)
Per [employer], I have submitted a new prior auth for Tadalafil '5MG'$  tablets with the diagnosis of Benign Prostatic Hyperplasia. [facility] Key: [id number] - PA Case ID: [id number] Waiting for determination.

## 2021-10-23 NOTE — Telephone Encounter (Signed)
Prior auth for Tadalafil '5MG'$  tablets has been approved. [facility] Key: [id number] - PA Case ID: [id number]  Per Cover My Meds, an appeal was started on 10/22/21.  I called [facility] [facility] pharmacy help desk.  The appeal for medication has been approved, from 10/22/21 through 09/2022.  Notified patient via mychart.

## 2021-10-23 NOTE — Telephone Encounter (Signed)
Called patient informed patient prior Josem Kaufmann had been approved . Patient verbalized understanding. Will call if any further questions.

## 2021-10-24 DIAGNOSIS — I10 Essential (primary) hypertension: Secondary | ICD-10-CM | POA: Diagnosis not present

## 2021-10-24 DIAGNOSIS — E785 Hyperlipidemia, unspecified: Secondary | ICD-10-CM | POA: Diagnosis not present

## 2021-10-24 DIAGNOSIS — J449 Chronic obstructive pulmonary disease, unspecified: Secondary | ICD-10-CM

## 2021-10-24 DIAGNOSIS — F32A Depression, unspecified: Secondary | ICD-10-CM

## 2021-10-24 DIAGNOSIS — I4891 Unspecified atrial fibrillation: Secondary | ICD-10-CM

## 2021-10-27 ENCOUNTER — Ambulatory Visit
Admission: RE | Admit: 2021-10-27 | Discharge: 2021-10-27 | Disposition: A | Discharge status: Home / Self Care | Payer: PPO | Source: Ambulatory Visit | Attending: Family | Admitting: Family

## 2021-10-27 ENCOUNTER — Ambulatory Visit (INDEPENDENT_AMBULATORY_CARE_PROVIDER_SITE_OTHER): Payer: PPO | Admitting: Family

## 2021-10-27 ENCOUNTER — Encounter: Payer: Self-pay | Admitting: Family

## 2021-10-27 ENCOUNTER — Other Ambulatory Visit: Payer: Self-pay | Admitting: Family

## 2021-10-27 VITALS — BP 120/78 | HR 73 | Temp 98.2°F | Resp 16 | Ht 65.5 in | Wt 193.4 lb

## 2021-10-27 DIAGNOSIS — K5732 Diverticulitis of large intestine without perforation or abscess without bleeding: Secondary | ICD-10-CM

## 2021-10-27 DIAGNOSIS — M5136 Other intervertebral disc degeneration, lumbar region: Secondary | ICD-10-CM | POA: Insufficient documentation

## 2021-10-27 DIAGNOSIS — K828 Other specified diseases of gallbladder: Secondary | ICD-10-CM | POA: Diagnosis not present

## 2021-10-27 DIAGNOSIS — R1012 Left upper quadrant pain: Secondary | ICD-10-CM | POA: Diagnosis not present

## 2021-10-27 DIAGNOSIS — R1032 Left lower quadrant pain: Secondary | ICD-10-CM

## 2021-10-27 DIAGNOSIS — I700 Atherosclerosis of aorta: Secondary | ICD-10-CM | POA: Insufficient documentation

## 2021-10-27 HISTORY — DX: Left lower quadrant pain: R10.32

## 2021-10-27 HISTORY — DX: Diverticulitis of large intestine without perforation or abscess without bleeding: K57.32

## 2021-10-27 LAB — CBC
HCT: 46.2 % (ref 39.0–52.0)
Hemoglobin: 15.6 g/dL (ref 13.0–17.0)
MCHC: 33.7 g/dL (ref 30.0–36.0)
MCV: 92.1 fl (ref 78.0–100.0)
Platelets: 191 10*3/uL (ref 150.0–400.0)
RBC: 5.01 Mil/uL (ref 4.22–5.81)
RDW: 14 % (ref 11.5–15.5)
WBC: 13.4 10*3/uL — ABNORMAL HIGH (ref 4.0–10.5)

## 2021-10-27 LAB — POCT I-STAT CREATININE: Creatinine, Ser: 0.7 mg/dL (ref 0.61–1.24)

## 2021-10-27 LAB — COMPREHENSIVE METABOLIC PANEL
ALT: 25 U/L (ref 0–53)
AST: 19 U/L (ref 0–37)
Albumin: 4.3 g/dL (ref 3.5–5.2)
Alkaline Phosphatase: 69 U/L (ref 39–117)
BUN: 11 mg/dL (ref 6–23)
CO2: 24 mEq/L (ref 19–32)
Calcium: 9.3 mg/dL (ref 8.4–10.5)
Chloride: 102 mEq/L (ref 96–112)
Creatinine, Ser: 0.76 mg/dL (ref 0.40–1.50)
GFR: 93.91 mL/min (ref >60.00)
Glucose, Bld: 100 mg/dL — ABNORMAL HIGH (ref 70–99)
Potassium: 4.1 mEq/L (ref 3.5–5.1)
Sodium: 135 mEq/L (ref 135–145)
Total Bilirubin: 0.9 mg/dL (ref 0.2–1.2)
Total Protein: 6.9 g/dL (ref 6.0–8.3)

## 2021-10-27 LAB — AMYLASE: Amylase: 25 U/L — ABNORMAL LOW (ref 27–131)

## 2021-10-27 LAB — LIPASE: Lipase: 17 U/L (ref 11.0–59.0)

## 2021-10-27 MED ORDER — IOHEXOL 300 MG/ML  SOLN
100.0000 mL | Freq: Once | INTRAMUSCULAR | Status: AC | PRN
  Administered 2021-10-27: 100 mL via INTRAVENOUS

## 2021-10-27 MED ORDER — AMOXICILLIN-POT CLAVULANATE 875-125 MG PO TABS: 1.0000 | ORAL_TABLET | Freq: Two times a day (BID) | ORAL | 0 refills | Status: DC

## 2021-10-27 NOTE — Progress Notes (Signed)
Established Patient Office Visit  Subjective:  Patient ID: Brandon Lopez, male    DOB: Jul 03, 1955  Age: 66 y.o. MRN: [id number]  CC:  Chief Complaint  Patient presents with   Abdominal Pain    Started early the am left bottom     HPI Brandon Lopez is here today with concerns.   This am woke up with left lower abdominal quadrant pain. The pain was sharp and stabbing and very uncomfortable, and the pain was constant. The pain is still constant, If not actively pressing on it its 4/10 pain however if pressing the area 10/10 pain.   Three bowel movements this am, normal bowel movements. Doesn't feel as though he has pulled a muscle, hasn't done anything strenuous. Denies seeing blood in stool. No nausea not throwing up.   Last night for dinner he ate 'junk' he states ate three jalapeno poppers and a few peanuts, and some trail mix. Yesterday am ate half steak biscuit from [employer] with tomato.   Past Medical History:  Diagnosis Date   Actinic keratosis    Arthritis    Chronic systolic CHF (congestive heart failure) ([place])    a. echo 6/16: EF 55-60%, Gr1DD, LA nl, PASP nl; b. echo 7/19: EF 40-45%, unable to exclude RWMA, Gr1DD, LA nl, RVSF nl, PASP nl   COPD (chronic obstructive pulmonary disease) (HCC)    Depression    Diverticulosis    Found during colonoscopy   Hypertension    Kidney stones    PAF (paroxysmal atrial fibrillation) ([place])    a. short episodes previously noted on PPM interrogation; b. started on Xarelto 07/2017 given further episodes of Afib noted during interrogation; c. 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 Biotronik; Dayna Barker DR-T [id number]; placed 08/2012   Sleep apnea    Symptomatic bradycardia    a. s/p Biotronik; Dayna Barker DR-T [id number]; placed 08/2012 at [facility]    Past Surgical History:  Procedure Laterality Date   APPENDECTOMY   age 73   CARDIAC CATHETERIZATION     7 years ago   COLONOSCOPY N/A 05/27/2014   Procedure: COLONOSCOPY;  Surgeon: Hulen Luster, MD;  Location: [facility] ENDOSCOPY;  Service: Gastroenterology;  Laterality: N/A;   INSERT / REPLACE / REMOVE PACEMAKER     3 years ago    Family History  Problem Relation Age of Onset   Arthritis Mother    Heart disease Father    Hyperlipidemia Father    Hypertension Father    Cancer Paternal Grandfather     Social History   Socioeconomic History   Marital status: Single    Spouse name: Not on file   Number of children: Not on file   Years of education: Not on file   Highest education level: Not on file  Occupational History   Not on file  Tobacco Use   Smoking status: Former    Packs/day: 2.00    Years: 50.00    Total pack years: 100.00    Types: Cigarettes    Start date: 41    Quit date: 02/01/2021    Years since quitting: 0.7   Smokeless tobacco: Never   Tobacco comments:    started smoking at age 2, 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  Vaping Use   Vaping Use: Never used  Substance and Sexual Activity   Alcohol use: Yes  Alcohol/week: 8.0 - 14.0 standard drinks of alcohol    Types: 8 - 14 Cans of beer per week   Drug use: Yes    Types: Marijuana   Sexual activity: Not on file  Other Topics Concern   Not on file  Social History Narrative   Not on file   Social Determinants of Health   Financial Resource Strain: Low Risk  (07/15/2021)   Overall Financial Resource Strain (CARDIA)    Difficulty of Paying Living Expenses: Not hard at all  Food Insecurity: No Food Insecurity (07/15/2021)   Hunger Vital Sign    Worried About Running Out of Food in the Last Year: Never true    Ran Out of Food in the Last Year: Never true  Transportation Needs: No Transportation Needs (07/15/2021)   PRAPARE - Hydrologist (Medical): No    Lack of Transportation (Non-Medical): No  Physical Activity: Inactive (07/15/2021)   Exercise  Vital Sign    Days of Exercise per Week: 0 days    Minutes of Exercise per Session: 0 min  Stress: No Stress Concern Present (07/15/2021)   [place]    Feeling of Stress : Only a little  Social Connections: Socially Isolated (07/15/2021)   Social Connection and Isolation Panel [NHANES]    Frequency of Communication with Friends and Family: More than three times a week    Frequency of Social Gatherings with Friends and Family: Three times a week    Attends Religious Services: Never    Active Member of Clubs or Organizations: No    Attends Archivist Meetings: Never    Marital Status: Divorced  Human resources officer Violence: Not At Risk (07/15/2021)   Humiliation, Afraid, Rape, and Kick questionnaire    Fear of Current or Ex-Partner: No    Emotionally Abused: No    Physically Abused: No    Sexually Abused: No    Outpatient Medications Prior to Visit  Medication Sig Dispense Refill   albuterol (VENTOLIN HFA) 108 (90 Base) MCG/ACT inhaler INHALE 2 PUFFS BY MOUTH EVERY 6 HOURS AS NEEDED FOR WHEEZING AND FOR SHORTNESS OF BREATH 18 g 0   aspirin EC 81 MG tablet Take 81 mg by mouth daily. Swallow whole.     atorvastatin (LIPITOR) 20 MG tablet Take 1 tablet (20 mg total) by mouth daily. For cholesterol. 90 tablet 2   buPROPion ER (WELLBUTRIN SR) 100 MG 12 hr tablet TAKE 1 TABLET BY MOUTH TWICE DAILY FOR DEPRESSION 180 tablet 0   carvedilol (COREG) 3.125 MG tablet Take 1 tablet (3.125 mg total) by mouth 2 (two) times daily. 180 tablet 0   ezetimibe (ZETIA) 10 MG tablet Take 1 tablet by mouth once daily 90 tablet 2   folic acid (FOLVITE) 1 MG tablet Take 1 tablet (1 mg total) by mouth daily. 90 tablet 1   loratadine (CLARITIN) 10 MG tablet Take 10 mg by mouth daily as needed for allergies.      meloxicam (MOBIC) 15 MG tablet Take 0.5-1 tablets (7.5-15 mg total) by mouth daily as needed for pain. 30 tablet 6   SYMBICORT  160-4.5 MCG/ACT inhaler Inhale 2 puffs into the lungs 2 (two) times daily. 33 g 0   tadalafil (CIALIS) 5 MG tablet Take 1 tablet (5 mg total) by mouth daily. For frequent urination 90 tablet 0   telmisartan (MICARDIS) 20 MG tablet Take 1 tablet (20 mg total) by mouth daily.  for blood pressure. 90 tablet 0   ipratropium-albuterol (DUONEB) 0.5-2.5 (3) MG/3ML SOLN Take 3 mLs by nebulization every 6 (six) hours as needed. (Patient not taking: Reported on 10/27/2021) 360 mL 3   No facility-administered medications prior to visit.    Allergies  Allergen Reactions   Simvastatin Diarrhea   Lisinopril Cough        Objective:    Physical Exam Constitutional:      Appearance: He is well-developed. He is obese.  Cardiovascular:     Rate and Rhythm: Normal rate and regular rhythm.  Pulmonary:     Effort: Pulmonary effort is normal.     Breath sounds: Normal breath sounds.  Abdominal:     General: Abdomen is flat. Bowel sounds are decreased. There is distension.     Palpations: There is no mass.     Tenderness: There is abdominal tenderness in the left upper quadrant and left lower quadrant.     Comments: Left lower quadrant to mid left lower quadrant with 10 /10 pain on palpation   Neurological:     Mental Status: He is alert.     BP 120/78   Pulse 73   Temp 98.2 F (36.8 C)   Resp 16   Ht 5' 5.5" (1.664 m)   Wt 193 lb 6 oz (87.7 kg)   SpO2 97%   BMI 31.69 kg/m  Wt Readings from Last 3 Encounters:  10/27/21 193 lb 6 oz (87.7 kg)  08/05/21 190 lb (86.2 kg)  07/23/21 190 lb (86.2 kg)     Health Maintenance Due  Topic Date Due   Zoster Vaccines- Shingrix (1 of 2) Never done   INFLUENZA VACCINE  08/25/2021    There are no preventive care reminders to display for this patient.  Lab Results  Component Value Date   TSH 2.67 06/17/2021   Lab Results  Component Value Date   WBC 11.1 (H) 07/13/2021   HGB 15.0 07/13/2021   HCT 43.8 07/13/2021   MCV 91.8 07/13/2021   PLT  238 07/13/2021   Lab Results  Component Value Date   NA 135 07/13/2021   K 3.7 07/13/2021   CO2 21 (L) 07/13/2021   GLUCOSE 121 (H) 07/13/2021   BUN 17 07/13/2021   CREATININE 0.87 07/13/2021   BILITOT 1.2 07/13/2021   ALKPHOS 53 07/13/2021   AST 15 07/13/2021   ALT 19 07/13/2021   PROT 7.1 07/13/2021   ALBUMIN 3.9 07/13/2021   CALCIUM 8.7 (L) 07/13/2021   ANIONGAP 9 07/13/2021   GFR 80.77 06/17/2021   Lab Results  Component Value Date   HGBA1C 5.8 04/21/2021      Assessment & Plan:   Problem List Items Addressed This Visit       Other   Left lower quadrant abdominal pain - Primary    Stat CT abd pelvis  Stat cbc cmp amylase lipase  R/o diverticulitis r/o pancreatitis Did advise my recommendation is to go straight to ER however pt politely declines states he doesn't want to go there with covid and wait for hours.  Advised pt if any increase pain to go immediately to ER and or call 911      Relevant Orders   CT Abdomen Pelvis W Contrast   Amylase   Lipase   CBC   Comprehensive metabolic panel   Other Visit Diagnoses     Left upper quadrant abdominal pain       Relevant Orders   CBC  Comprehensive metabolic panel       No orders of the defined types were placed in this encounter.   Follow-up: No follow-ups on file.    Eugenia Pancoast, FNP

## 2021-10-27 NOTE — Progress Notes (Signed)
Called and spoke with patient advised him of mild diverticulitis.  Sending in prescription for Augmentin 875/125 mg p.o. twice daily for 14 days.  Advised patient if any increasing or worsening pain and/or blood in stool or shortness of breath immediately go to the emergency room and/or call 911.  [name] this is for your review as well I have advised him to follow-up in 1 week he is going to call the office when he is done driving home.  Notably there is a suspected small lymph node adjacent to the gallbladder on the CAT scan as well as aortic arthrosclerosis and some lumbar disc degeneration.  I added this to the problem list for you.  Also was noted to have some emphysema, and some small subcentimeter renal lesions that were not concerning

## 2021-10-27 NOTE — Assessment & Plan Note (Signed)
Stat CT abd pelvis  Stat cbc cmp amylase lipase  R/o diverticulitis r/o pancreatitis Did advise my recommendation is to go straight to ER however pt politely declines states he doesn't want to go there with covid and wait for hours.  Advised pt if any increase pain to go immediately to ER and or call 911

## 2021-10-27 NOTE — Telephone Encounter (Signed)
Scheduled with Tabitha, 10/03.

## 2021-10-30 DIAGNOSIS — G4730 Sleep apnea, unspecified: Secondary | ICD-10-CM | POA: Diagnosis not present

## 2021-11-01 DIAGNOSIS — J441 Chronic obstructive pulmonary disease with (acute) exacerbation: Secondary | ICD-10-CM | POA: Diagnosis not present

## 2021-11-09 ENCOUNTER — Other Ambulatory Visit: Payer: Self-pay | Admitting: Primary Care

## 2021-11-09 DIAGNOSIS — I10 Essential (primary) hypertension: Secondary | ICD-10-CM

## 2021-11-10 ENCOUNTER — Telehealth: Payer: Self-pay | Admitting: *Deleted

## 2021-11-10 ENCOUNTER — Telehealth: Payer: Self-pay | Admitting: Internal Medicine

## 2021-11-10 ENCOUNTER — Encounter (HOSPITAL_BASED_OUTPATIENT_CLINIC_OR_DEPARTMENT_OTHER): Payer: Self-pay | Admitting: Otolaryngology

## 2021-11-10 ENCOUNTER — Other Ambulatory Visit: Payer: Self-pay

## 2021-11-10 ENCOUNTER — Encounter: Payer: Self-pay | Admitting: Internal Medicine

## 2021-11-10 ENCOUNTER — Other Ambulatory Visit: Payer: Self-pay | Admitting: Otolaryngology

## 2021-11-10 NOTE — Telephone Encounter (Signed)
I did not see that we received a clearance request for the pt. I was able to pull information from epic for procedure and did enter in a clearance request. I am not sure if the only clearance needed was for device clearance.  We will need a ph and fax number for Dr. Melida Quitter. I left a message for DR. Bates surgery scheduler to please call our office for clarification if they only needed device clearance or did they need both cardiac and device clearance. Left # to cal back [phone].

## 2021-11-10 NOTE — Telephone Encounter (Signed)
Patient called stating he is having an endoscopy done tomorrow morning, and Caroline was going to fax a clearance to make sure it was okay for him to have it since he has pace maker. I did not see we have received anything as of yet.  He said they were faxing it over this morning.

## 2021-11-10 NOTE — Progress Notes (Signed)
PERIOPERATIVE PRESCRIPTION FOR IMPLANTED CARDIAC DEVICE PROGRAMMING  Patient Information: Name:  Brandon Lopez  DOB:  01-13-1956  MRN:  [id number]  Planned Procedure:  Drug induced endoscopy  Surgeon:  Dr Redmond Baseman  Date of Procedure:  11-11-2021  Cautery will be used.  Position during surgery:  supine   Device Information:  Clinic EP Physician:  Virl Axe, MD   Device Type:  Pacemaker Manufacturer and Phone #:  Biotronik: [phone] Pacemaker Dependent?:  No. Date of Last Device Check:  09/08/2021 Normal Device Function?:  Yes.    Electrophysiologist's Recommendations:  Have magnet available. Provide continuous ECG monitoring when magnet is used or reprogramming is to be performed.  Procedure should not interfere with device function.  No device programming or magnet placement needed.  Per Device Clinic Standing Orders, Damian Leavell, RN  10:38 AM 11/10/2021

## 2021-11-10 NOTE — Progress Notes (Signed)
   11/10/21 1053  PAT Phone Screen  Do You Have Diabetes? No  Do You Have Hypertension? Yes  Have You Ever Been to the ER for Asthma? No  Have You Taken Oral Steroids in the Past 3 Months? No  Do you Take Phenteramine or any Other Diet Drugs? No  Recent  Lab Work, EKG, CXR? Yes  Where was this test performed? 10-27-21 CBC, CMP, 04-28-21 EKG  Do you have a history of heart problems? (S)  Yes;Cardiologist (Dr Sharlene Dory cardiomyopathy, bradycardia-now with Pacemaker)  Have You Ever Had Tests on Your Heart? Yes  Where? 04-24-21 ECHO EF 60-65%  Any Recent Hospitalizations? No  Height 5' 5.5" (1.664 m)  Weight 87.7 kg  Pat Appointment Scheduled No  Reason for No Appointment Not Needed

## 2021-11-10 NOTE — Progress Notes (Signed)
Patient's chart reviewed with Dr Gloris Manchester, [place] for [facility].

## 2021-11-10 NOTE — Telephone Encounter (Signed)
I s/w the pt to let him that I did call and leave a message for Dr. Redmond Baseman office to call back and confirm about clearance needed. See previous notes.

## 2021-11-10 NOTE — Telephone Encounter (Signed)
   Pre-operative Risk Assessment    Patient Name: Brandon Lopez  DOB: 1955-08-27 MRN: [id number]      Request for Surgical Clearance    Procedure:   DRUG INDUCED ENDOSCOPY  Date of Surgery:  Clearance 11/11/21                                 Surgeon:  DR. Orpah Greek BATES Surgeon's Group or Practice Name:   Phone number:   Fax number:     Type of Clearance Requested:   - Medical ; NO MEDICATIONS ARE LISTED AS NEEDING TO BE HELD. REQUEST FOR DEVICE CLEARANCE ONLY IT SEEMS   Type of Anesthesia:  General    Additional requests/questions:    Jiles Prows   11/10/2021, 1:25 PM

## 2021-11-10 NOTE — Telephone Encounter (Signed)
I will fax note for device to requesting office

## 2021-11-10 NOTE — Telephone Encounter (Signed)
I left a message again for the surgery scheduler for DR. Redmond Baseman. We are needing to confirm if they are needing clearance as the pt is who called [place] concerned about his pacemake and he could have his procedure with Dr. Redmond Baseman.   We will need a call today by 4:30 if needed clearance from [place].

## 2021-11-11 ENCOUNTER — Encounter (HOSPITAL_BASED_OUTPATIENT_CLINIC_OR_DEPARTMENT_OTHER): Payer: Self-pay | Admitting: Otolaryngology

## 2021-11-11 ENCOUNTER — Ambulatory Visit (HOSPITAL_BASED_OUTPATIENT_CLINIC_OR_DEPARTMENT_OTHER): Payer: PPO | Admitting: Anesthesiology

## 2021-11-11 ENCOUNTER — Other Ambulatory Visit: Payer: Self-pay

## 2021-11-11 ENCOUNTER — Encounter (HOSPITAL_BASED_OUTPATIENT_CLINIC_OR_DEPARTMENT_OTHER)
Admission: RE | Disposition: A | Discharge status: Home / Self Care | Payer: PPO | Source: Home / Self Care | Attending: Otolaryngology

## 2021-11-11 ENCOUNTER — Ambulatory Visit (HOSPITAL_BASED_OUTPATIENT_CLINIC_OR_DEPARTMENT_OTHER)
Admission: RE | Admit: 2021-11-11 | Discharge: 2021-11-11 | Disposition: A | Discharge status: Home / Self Care | Payer: PPO | Attending: Otolaryngology | Admitting: Otolaryngology

## 2021-11-11 DIAGNOSIS — G4733 Obstructive sleep apnea (adult) (pediatric): Secondary | ICD-10-CM | POA: Insufficient documentation

## 2021-11-11 DIAGNOSIS — I5022 Chronic systolic (congestive) heart failure: Secondary | ICD-10-CM | POA: Diagnosis not present

## 2021-11-11 DIAGNOSIS — F419 Anxiety disorder, unspecified: Secondary | ICD-10-CM | POA: Insufficient documentation

## 2021-11-11 DIAGNOSIS — I110 Hypertensive heart disease with heart failure: Secondary | ICD-10-CM | POA: Insufficient documentation

## 2021-11-11 DIAGNOSIS — Z87891 Personal history of nicotine dependence: Secondary | ICD-10-CM

## 2021-11-11 DIAGNOSIS — F32A Depression, unspecified: Secondary | ICD-10-CM | POA: Diagnosis not present

## 2021-11-11 DIAGNOSIS — J449 Chronic obstructive pulmonary disease, unspecified: Secondary | ICD-10-CM | POA: Diagnosis not present

## 2021-11-11 DIAGNOSIS — I509 Heart failure, unspecified: Secondary | ICD-10-CM

## 2021-11-11 DIAGNOSIS — F418 Other specified anxiety disorders: Secondary | ICD-10-CM | POA: Diagnosis not present

## 2021-11-11 DIAGNOSIS — Z01818 Encounter for other preprocedural examination: Secondary | ICD-10-CM

## 2021-11-11 HISTORY — PX: DRUG INDUCED ENDOSCOPY: SHX6808

## 2021-11-11 SURGERY — DRUG INDUCED SLEEP ENDOSCOPY
Anesthesia: Monitor Anesthesia Care | Site: Nose | Laterality: Bilateral

## 2021-11-11 MED ORDER — PROPOFOL 10 MG/ML IV BOLUS
INTRAVENOUS | Status: DC | PRN
  Administered 2021-11-11 (×2): 20 mg via INTRAVENOUS
  Administered 2021-11-11: 10 mg via INTRAVENOUS
  Administered 2021-11-11 (×2): 20 mg via INTRAVENOUS
  Administered 2021-11-11: 10 mg via INTRAVENOUS

## 2021-11-11 MED ORDER — KETOROLAC TROMETHAMINE 15 MG/ML IJ SOLN: 15.0000 mg | Freq: Once | INTRAMUSCULAR | Status: DC | PRN

## 2021-11-11 MED ORDER — OXYMETAZOLINE HCL 0.05 % NA SOLN
NASAL | Status: DC | PRN
  Administered 2021-11-11: 1 via TOPICAL

## 2021-11-11 MED ORDER — ONDANSETRON HCL 4 MG/2ML IJ SOLN: 4.0000 mg | Freq: Once | INTRAMUSCULAR | Status: DC | PRN

## 2021-11-11 MED ORDER — ONDANSETRON HCL 4 MG/2ML IJ SOLN
INTRAMUSCULAR | Status: DC | PRN
  Administered 2021-11-11: 4 mg via INTRAVENOUS

## 2021-11-11 MED ORDER — ACETAMINOPHEN 10 MG/ML IV SOLN: 1000.0000 mg | Freq: Once | INTRAVENOUS | Status: DC | PRN

## 2021-11-11 MED ORDER — PROPOFOL 500 MG/50ML IV EMUL
INTRAVENOUS | Status: DC | PRN
  Administered 2021-11-11: 100 ug/kg/min via INTRAVENOUS

## 2021-11-11 MED ORDER — AMISULPRIDE (ANTIEMETIC) 5 MG/2ML IV SOLN: 10.0000 mg | Freq: Once | INTRAVENOUS | Status: DC | PRN

## 2021-11-11 MED ORDER — FENTANYL CITRATE (PF) 100 MCG/2ML IJ SOLN: 25.0000 ug | INTRAMUSCULAR | Status: DC | PRN

## 2021-11-11 MED ORDER — LACTATED RINGERS IV SOLN: INTRAVENOUS | Status: DC

## 2021-11-11 SURGICAL SUPPLY — 12 items
CANISTER SUCT 1200ML W/VALVE (MISCELLANEOUS) ×1 IMPLANT
GLOVE BIO SURGEON STRL SZ7.5 (GLOVE) ×1 IMPLANT
KIT CLEAN ENDO (MISCELLANEOUS) ×1 IMPLANT
NDL HYPO 27GX1-1/4 (NEEDLE) IMPLANT
NEEDLE HYPO 27GX1-1/4 (NEEDLE) IMPLANT
PATTIES SURGICAL .5 X3 (DISPOSABLE) ×1 IMPLANT
SHEET MEDIUM DRAPE 40X70 STRL (DRAPES) ×1 IMPLANT
SOL ANTI FOG 6CC (MISCELLANEOUS) ×1 IMPLANT
SOLUTION ANTI FOG 6CC (MISCELLANEOUS) ×1
SYR CONTROL 10ML LL (SYRINGE) IMPLANT
TOWEL GREEN STERILE FF (TOWEL DISPOSABLE) ×1 IMPLANT
TUBE CONNECTING 20X1/4 (TUBING) ×1 IMPLANT

## 2021-11-11 NOTE — Telephone Encounter (Signed)
Angela from Dr. Redmond Baseman office called said they did not need cardiac clearance. States it was juts clearance needed for pt's device, pacemaker. I did inform that I see in epic what I believe is the device clearance. I thanked Levada Dy for her help in this matter.

## 2021-11-11 NOTE — Discharge Instructions (Signed)

## 2021-11-11 NOTE — Brief Op Note (Signed)
11/11/2021  10:06 AM  PATIENT:  Brandon Lopez  66 y.o. male  PRE-OPERATIVE DIAGNOSIS:  Obstructive Sleep Apnea BMI 32.0-32.9,adult  POST-OPERATIVE DIAGNOSIS:  Obstructive Sleep Apnea  PROCEDURE:  Procedure(s): DRUG INDUCED ENDOSCOPY (Bilateral)  SURGEON:  Surgeon(s) and Role:    Melida Quitter, MD - Primary  PHYSICIAN ASSISTANT:   ASSISTANTS: none   ANESTHESIA:   IV sedation  EBL:  None   BLOOD ADMINISTERED:none  DRAINS: none   LOCAL MEDICATIONS USED:  NONE  SPECIMEN:  No Specimen  DISPOSITION OF SPECIMEN:  N/A  COUNTS:  YES  TOURNIQUET:  * No tourniquets in log *  DICTATION: .Note written in EPIC  PLAN OF CARE: Discharge to home after PACU  PATIENT DISPOSITION:  PACU - hemodynamically stable.   Delay start of Pharmacological VTE agent (>24hrs) due to surgical blood loss or risk of bleeding: no

## 2021-11-11 NOTE — Op Note (Signed)
Preop diagnosis: Obstructive sleep apnea Postop diagnosis: same Procedure: Drug-induced sleep endoscopy Surgeon: Redmond Baseman Anesth: IV sedation Compl: None Findings: There is 50% anterior-posterior and 50% side wall collapse at the velum making him a candidate for hypoglossal nerve stimulator placement.  There was also marked anterior-posterior collapse at the tongue base. Description:  After discussing risks, benefits, and alternatives, the patient was brought to the operative suite and placed on the operative table in the supine position.  Anesthesia was induced and the patient was given light sedation to simulate natural sleep. When the proper level was reached, an Afrin-soaked pledget was placed in the right nasal passage for a couple of minutes and then removed.  The fiberoptic laryngoscope was then passed to view the pharynx and larynx.  Findings are noted above and the exam was recorded.  After completion, the scope was removed and the patient was returned to anesthesia for wakeup and was moved to the recovery room in stable condition.

## 2021-11-11 NOTE — Transfer of Care (Signed)
Immediate Anesthesia Transfer of Care Note  Patient: Brandon Lopez  Procedure(s) Performed: DRUG INDUCED ENDOSCOPY (Bilateral: Nose)  Patient Location: PACU  Anesthesia Type:MAC  Level of Consciousness: awake, alert  and oriented  Airway & Oxygen Therapy: Patient Spontanous Breathing and Patient connected to face mask oxygen  Post-op Assessment: Report given to RN and Post -op Vital signs reviewed and stable  Post vital signs: Reviewed and stable  Last Vitals:  Vitals Value Taken Time  BP 102/80 11/11/21 1009  Temp    Pulse 60 11/11/21 1011  Resp 19 11/11/21 1011  SpO2 99 % 11/11/21 1011  Vitals shown include unvalidated device data.  Last Pain:  Vitals:   11/11/21 0851  TempSrc: Oral  PainSc: 0-No pain         Complications: No notable events documented.

## 2021-11-11 NOTE — Anesthesia Postprocedure Evaluation (Signed)
Anesthesia Post Note  Patient: Brandon Lopez  Procedure(s) Performed: DRUG INDUCED ENDOSCOPY (Bilateral: Nose)     Patient location during evaluation: PACU Anesthesia Type: MAC Level of consciousness: awake Pain management: pain level controlled Vital Signs Assessment: post-procedure vital signs reviewed and stable Respiratory status: spontaneous breathing, nonlabored ventilation, respiratory function stable and patient connected to nasal cannula oxygen Cardiovascular status: stable and blood pressure returned to baseline Postop Assessment: no apparent nausea or vomiting Anesthetic complications: no   No notable events documented.  Last Vitals:  Vitals:   11/11/21 1015 11/11/21 1030  BP: 105/88 (!) 131/95  Pulse: 74 72  Resp: 20 (!) 22  Temp:  36.5 C  SpO2: 96% 97%    Last Pain:  Vitals:   11/11/21 1030  TempSrc: Oral  PainSc: 0-No pain                 Shellyann Wandrey P Soliana Kitko

## 2021-11-11 NOTE — Anesthesia Preprocedure Evaluation (Signed)
Anesthesia Evaluation  Patient identified by MRN, date of birth, ID band Patient awake    Reviewed: Allergy & Precautions, NPO status , Patient's Chart, lab work & pertinent test results  Airway Mallampati: III  TM Distance: >3 FB Neck ROM: Full    Dental no notable dental hx.    Pulmonary sleep apnea , COPD,  COPD inhaler, former smoker,    Pulmonary exam normal        Cardiovascular hypertension, Pt. on medications +CHF  Normal cardiovascular exam+ pacemaker      Neuro/Psych PSYCHIATRIC DISORDERS Anxiety Depression negative neurological ROS     GI/Hepatic negative GI ROS, Neg liver ROS,   Endo/Other  negative endocrine ROS  Renal/GU Renal disease     Musculoskeletal  (+) Arthritis ,   Abdominal   Peds  Hematology negative hematology ROS (+)   Anesthesia Other Findings Obstructive Sleep Apnea  BMI 32.0-32.9 adult  Reproductive/Obstetrics                             Anesthesia Physical Anesthesia Plan  ASA: 3  Anesthesia Plan: MAC   Post-op Pain Management:    Induction: Intravenous  PONV Risk Score and Plan: 1 and Propofol infusion and Treatment may vary due to age or medical condition  Airway Management Planned: Natural Airway  Additional Equipment:   Intra-op Plan:   Post-operative Plan:   Informed Consent: I have reviewed the patients History and Physical, chart, labs and discussed the procedure including the risks, benefits and alternatives for the proposed anesthesia with the patient or authorized representative who has indicated his/her understanding and acceptance.     Dental advisory given  Plan Discussed with: CRNA  Anesthesia Plan Comments:         Anesthesia Quick Evaluation

## 2021-11-11 NOTE — H&P (Signed)
Brandon Lopez is an 66 y.o. male.   Chief Complaint: Sleep apnea HPI: 66 year old male with obstructive sleep apnea who has been unable to tolerate CPAP.  Past Medical History:  Diagnosis Date   Actinic keratosis    Arthritis    Chronic systolic CHF (congestive heart failure) ([place])    a. echo 6/16: EF 55-60%, Gr1DD, LA nl, PASP nl; b. echo 7/19: EF 40-45%, unable to exclude RWMA, Gr1DD, LA nl, RVSF nl, PASP nl   COPD (chronic obstructive pulmonary disease) ([facility])    Depression    Diverticulosis    Found during colonoscopy   Hypertension    Kidney stones    PAF (paroxysmal atrial fibrillation) ([place])    a. short episodes previously noted on PPM interrogation; b. started on Xarelto 07/2017 given further episodes of Afib noted during interrogation; c. 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 Biotronik; Dayna Barker DR-T [id number]; placed 08/2012   Sleep apnea    does not use CPAP   Symptomatic bradycardia    a. s/p Biotronik; Dayna Barker DR-T [id number]; placed 08/2012 at [facility]    Past Surgical History:  Procedure Laterality Date   APPENDECTOMY  age 78   CARDIAC CATHETERIZATION     7 years ago   COLONOSCOPY N/A 05/27/2014   Procedure: COLONOSCOPY;  Surgeon: Hulen Luster, MD;  Location: [facility] ENDOSCOPY;  Service: Gastroenterology;  Laterality: N/A;   INSERT / REPLACE / REMOVE PACEMAKER     3 years ago    Family History  Problem Relation Age of Onset   Arthritis Mother    Heart disease Father    Hyperlipidemia Father    Hypertension Father    Cancer Paternal Grandfather    Social History:  reports that he quit smoking about 9 months ago. His smoking use included cigarettes. He started smoking about 59 years ago. He has a 100.00 pack-year smoking history. He has never used smokeless tobacco. He reports current alcohol use of about 8.0 - 14.0 standard drinks of alcohol per week.  He reports current drug use. Drug: Marijuana.  Allergies:  Allergies  Allergen Reactions   Simvastatin Diarrhea   Lisinopril Cough    Medications Prior to Admission  Medication Sig Dispense Refill   aspirin EC 81 MG tablet Take 81 mg by mouth daily. Swallow whole.     atorvastatin (LIPITOR) 20 MG tablet Take 1 tablet (20 mg total) by mouth daily. For cholesterol. 90 tablet 2   buPROPion ER (WELLBUTRIN SR) 100 MG 12 hr tablet TAKE 1 TABLET BY MOUTH TWICE DAILY FOR DEPRESSION 180 tablet 0   carvedilol (COREG) 3.125 MG tablet Take 1 tablet (3.125 mg total) by mouth 2 (two) times daily. 180 tablet 0   ezetimibe (ZETIA) 10 MG tablet Take 1 tablet by mouth once daily 90 tablet 2   folic acid (FOLVITE) 1 MG tablet Take 1 tablet (1 mg total) by mouth daily. 90 tablet 1   SYMBICORT 160-4.5 MCG/ACT inhaler Inhale 2 puffs into the lungs 2 (two) times daily. 33 g 0   tadalafil (CIALIS) 5 MG tablet Take 1 tablet (5 mg total) by mouth daily. For frequent urination 90 tablet 0   telmisartan (MICARDIS) 20 MG tablet Take 1 tablet by mouth once daily for blood pressure 90 tablet 0   albuterol (VENTOLIN HFA) 108 (90 Base) MCG/ACT inhaler INHALE 2  PUFFS BY MOUTH EVERY 6 HOURS AS NEEDED FOR WHEEZING AND FOR SHORTNESS OF BREATH 18 g 0   loratadine (CLARITIN) 10 MG tablet Take 10 mg by mouth daily as needed for allergies.      meloxicam (MOBIC) 15 MG tablet Take 0.5-1 tablets (7.5-15 mg total) by mouth daily as needed for pain. 30 tablet 6    No results found for this or any previous visit (from the past 48 hour(s)). No results found.  Review of Systems  All other systems reviewed and are negative.   Blood pressure (!) 144/97, pulse 73, temperature (!) 97.1 F (36.2 C), temperature source Oral, resp. rate 16, height 5' 5.5" (1.664 m), weight 87.1 kg, SpO2 98 %. Physical Exam Constitutional:      Appearance: Normal appearance. He is normal weight.  HENT:     Head: Normocephalic and atraumatic.      Right Ear: External ear normal.     Left Ear: External ear normal.     Nose: Nose normal.     Mouth/Throat:     Mouth: Mucous membranes are moist.     Pharynx: Oropharynx is clear.  Eyes:     Extraocular Movements: Extraocular movements intact.     Conjunctiva/sclera: Conjunctivae normal.     Pupils: Pupils are equal, round, and reactive to light.  Cardiovascular:     Rate and Rhythm: Normal rate.  Pulmonary:     Effort: Pulmonary effort is normal.  Musculoskeletal:     Cervical back: Normal range of motion.  Skin:    General: Skin is warm and dry.  Neurological:     General: No focal deficit present.     Mental Status: He is alert and oriented to person, place, and time.  Psychiatric:        Mood and Affect: Mood normal.        Behavior: Behavior normal.        Thought Content: Thought content normal.        Judgment: Judgment normal.      Assessment/Plan Obstructive sleep apnea, BMI 31.47  To OR for sleep endoscopy.  Melida Quitter, MD 11/11/2021, 9:44 AM

## 2021-11-12 ENCOUNTER — Encounter (HOSPITAL_BASED_OUTPATIENT_CLINIC_OR_DEPARTMENT_OTHER): Payer: Self-pay | Admitting: Otolaryngology

## 2021-11-17 NOTE — Progress Notes (Unsigned)
    Hannahmarie Asberry T. Kynzlie Hilleary, MD, [place] [facility] [place], [address]  Phone: [phone]  FAX: [phone]  Brandon Lopez - 66 y.o. male  MRN [id number]  Date of Birth: Jul 14, 1955  Date: 11/18/2021  PCP: Pleas Koch, NP  Referral: Pleas Koch, NP  No chief complaint on file.  Subjective:   Brandon Lopez is a 66 y.o. very pleasant male patient with There is no height or weight on file to calculate BMI. who presents with the following:  Patient presents with left-sided shoulder pain.  I did see him once for left-sided shoulder pain that was chronic in character roughly 3 years ago.  At that point I did do an intra-articular injection with good success.  He is here today to discuss his left shoulder, again.    Review of Systems is noted in the HPI, as appropriate  Objective:   There were no vitals taken for this visit.  GEN: No acute distress; alert,appropriate. PULM: Breathing comfortably in no respiratory distress PSYCH: Normally interactive.   Laboratory and Imaging Data:  Assessment and Plan:   ***

## 2021-11-18 ENCOUNTER — Encounter: Payer: Self-pay | Admitting: Family Medicine

## 2021-11-18 ENCOUNTER — Ambulatory Visit (INDEPENDENT_AMBULATORY_CARE_PROVIDER_SITE_OTHER): Payer: PPO | Admitting: Family Medicine

## 2021-11-18 VITALS — BP 130/90 | HR 73 | Temp 98.0°F | Ht 65.5 in | Wt 193.2 lb

## 2021-11-18 DIAGNOSIS — G8929 Other chronic pain: Secondary | ICD-10-CM

## 2021-11-18 DIAGNOSIS — M19012 Primary osteoarthritis, left shoulder: Secondary | ICD-10-CM

## 2021-11-18 DIAGNOSIS — M25512 Pain in left shoulder: Secondary | ICD-10-CM

## 2021-11-18 MED ORDER — TRIAMCINOLONE ACETONIDE 40 MG/ML IJ SUSP
40.0000 mg | Freq: Once | INTRAMUSCULAR | Status: AC
  Administered 2021-11-18: 40 mg via INTRA_ARTICULAR

## 2021-11-18 NOTE — Addendum Note (Signed)
Addended by: Carter Kitten on: 11/18/2021 11:38 AM   Modules accepted: Orders

## 2021-11-30 ENCOUNTER — Other Ambulatory Visit: Payer: Self-pay | Admitting: Otolaryngology

## 2021-12-02 DIAGNOSIS — J441 Chronic obstructive pulmonary disease with (acute) exacerbation: Secondary | ICD-10-CM | POA: Diagnosis not present

## 2021-12-03 ENCOUNTER — Other Ambulatory Visit: Payer: Self-pay | Admitting: Family

## 2021-12-03 DIAGNOSIS — D528 Other folate deficiency anemias: Secondary | ICD-10-CM

## 2021-12-08 ENCOUNTER — Ambulatory Visit (INDEPENDENT_AMBULATORY_CARE_PROVIDER_SITE_OTHER): Payer: PPO

## 2021-12-08 DIAGNOSIS — I495 Sick sinus syndrome: Secondary | ICD-10-CM

## 2021-12-09 LAB — CUP PACEART REMOTE DEVICE CHECK
Date Time Interrogation Session: 20231115073011
Implantable Lead Connection Status: 753985
Implantable Lead Connection Status: 753985
Implantable Lead Implant Date: 20140801
Implantable Lead Implant Date: 20140801
Implantable Lead Location: 753859
Implantable Lead Location: 753860
Implantable Lead Model: 362
Implantable Lead Serial Number: 29356730
Implantable Lead Serial Number: 29392661
Implantable Pulse Generator Implant Date: 20140801
Pulse Gen Serial Number: 68053398

## 2021-12-11 ENCOUNTER — Encounter: Payer: Self-pay | Admitting: Primary Care

## 2021-12-11 ENCOUNTER — Ambulatory Visit (INDEPENDENT_AMBULATORY_CARE_PROVIDER_SITE_OTHER): Payer: PPO | Admitting: Primary Care

## 2021-12-11 VITALS — BP 148/90 | HR 70 | Temp 97.2°F | Ht 65.5 in | Wt 196.0 lb

## 2021-12-11 DIAGNOSIS — R220 Localized swelling, mass and lump, head: Secondary | ICD-10-CM | POA: Diagnosis not present

## 2021-12-11 DIAGNOSIS — K1370 Unspecified lesions of oral mucosa: Secondary | ICD-10-CM

## 2021-12-11 HISTORY — DX: Localized swelling, mass and lump, head: R22.0

## 2021-12-11 HISTORY — DX: Unspecified lesions of oral mucosa: K13.70

## 2021-12-11 NOTE — Assessment & Plan Note (Signed)
No obvious lymph node enlargement and no signs of parotitis.   Recommended he reveal this to his ENT provider as he will be calling for an appointment. Consider soft tissue US if ENT is unavailable.

## 2021-12-11 NOTE — Assessment & Plan Note (Addendum)
Unclear etiology, but this does not appear to be normal. Given his history of tobacco dependence, further evaluation warranted.  He is already established with ENT so I have advised him to call his ENT's office ASAP for an appointment/evaluation. He agrees.  Continue Nystatin for now, although I do not believe his lesion to be fungal.  He will update if he cannot get in with ENT.

## 2021-12-11 NOTE — Progress Notes (Signed)
Subjective:    Patient ID: Brandon Lopez, male    DOB: 1955-04-11, 66 y.o.   MRN: [id number]  HPI  [name] is a very pleasant 66 y.o. male with a hstory of COPD, tobacco abuse, hypertension, OSA, pacemaker, alcohol dependence who presents today to discuss oral lesion.  He presented to the dentist earlier this week and was told by his dentist that he has a spot to the roof of his mouth that appeared abnormal. He was treated with Nystatin suspension by his dentist for which he's used a total of three times. No follow up scheduled.   He denies pain, itching, difficulty eating/drinking, increased cough, other oral lesions. He is a prior smoker, quit smoking 02/01/21. He does not use chewing tobacco.    He is currently following with ENT for Inspire device for OSA. He has not called them regarding this new finding.   Review of Systems  Constitutional:  Negative for unexpected weight change.  HENT:  Negative for dental problem, sore throat and trouble swallowing.   Gastrointestinal:  Negative for nausea.  Skin:  Positive for color change.         Past Medical History:  Diagnosis Date   Actinic keratosis    Arthritis    Chronic systolic CHF (congestive heart failure) ([place])    a. echo 6/16: EF 55-60%, Gr1DD, LA nl, PASP nl; b. echo 7/19: EF 40-45%, unable to exclude RWMA, Gr1DD, LA nl, RVSF nl, PASP nl   COPD (chronic obstructive pulmonary disease) (H[facility])    Depression    Diverticulosis    Found during colonoscopy   Hypertension    Kidney stones    PAF (paroxysmal atrial fibrillation) ([place])    a. short episodes previously noted on PPM interrogation; b. started on Xarelto 07/2017 given further episodes of Afib noted during interrogation; c. 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 Biotronik; Dayna Barker DR-T [id number]; placed 08/2012   Sleep apnea     does not use CPAP   Symptomatic bradycardia    a. s/p Biotronik; Dayna Barker DR-T [id number]; placed 08/2012 at [place] History   Socioeconomic History   Marital status: Single    Spouse name: Not on file   Number of children: Not on file   Years of education: Not on file   Highest education level: Not on file  Occupational History   Not on file  Tobacco Use   Smoking status: Former    Packs/day: 2.00    Years: 50.00    Total pack years: 100.00    Types: Cigarettes    Start date: 55    Quit date: 02/01/2021    Years since quitting: 0.8   Smokeless tobacco: Never   Tobacco comments:    started smoking at age 74, 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  Vaping Use   Vaping Use: Never used  Substance and Sexual Activity   Alcohol use: Yes    Alcohol/week: 8.0 - 14.0 standard drinks of alcohol    Types: 8 - 14 Cans of beer per week    Comment: daily beer   Drug use: Yes    Types: Marijuana    Comment: states smokes daily, last 11-09-21   Sexual activity: Yes  Other Topics Concern   Not on file  Social History Narrative   Not on file   Social  Determinants of Health   Financial Resource Strain: Low Risk  (07/15/2021)   Overall Financial Resource Strain (CARDIA)    Difficulty of Paying Living Expenses: Not hard at all  Food Insecurity: No Food Insecurity (07/15/2021)   Hunger Vital Sign    Worried About Running Out of Food in the Last Year: Never true    Ran Out of Food in the Last Year: Never true  Transportation Needs: No Transportation Needs (07/15/2021)   PRAPARE - Hydrologist (Medical): No    Lack of Transportation (Non-Medical): No  Physical Activity: Inactive (07/15/2021)   Exercise Vital Sign    Days of Exercise per Week: 0 days    Minutes of Exercise per Session: 0 min  Stress: No Stress Concern Present (07/15/2021)   [place]    Feeling of Stress : Only a little  Social  Connections: Socially Isolated (07/15/2021)   Social Connection and Isolation Panel [NHANES]    Frequency of Communication with Friends and Family: More than three times a week    Frequency of Social Gatherings with Friends and Family: Three times a week    Attends Religious Services: Never    Active Member of Clubs or Organizations: No    Attends Archivist Meetings: Never    Marital Status: Divorced  Human resources officer Violence: Not At Risk (07/15/2021)   Humiliation, Afraid, Rape, and Kick questionnaire    Fear of Current or Ex-Partner: No    Emotionally Abused: No    Physically Abused: No    Sexually Abused: No    Past Surgical History:  Procedure Laterality Date   APPENDECTOMY  age 89   CARDIAC CATHETERIZATION     7 years ago   COLONOSCOPY N/A 05/27/2014   Procedure: COLONOSCOPY;  Surgeon: Hulen Luster, MD;  Location: [facility] ENDOSCOPY;  Service: Gastroenterology;  Laterality: N/A;   DRUG INDUCED ENDOSCOPY Bilateral 11/11/2021   Procedure: DRUG INDUCED ENDOSCOPY;  Surgeon: Melida Quitter, MD;  Location: [place];  Service: ENT;  Laterality: Bilateral;   INSERT / REPLACE / REMOVE PACEMAKER     3 years ago    Family History  Problem Relation Age of Onset   Arthritis Mother    Heart disease Father    Hyperlipidemia Father    Hypertension Father    Cancer Paternal Grandfather     Allergies  Allergen Reactions   Simvastatin Diarrhea   Lisinopril Cough    Current Outpatient Medications on File Prior to Visit  Medication Sig Dispense Refill   albuterol (VENTOLIN HFA) 108 (90 Base) MCG/ACT inhaler INHALE 2 PUFFS BY MOUTH EVERY 6 HOURS AS NEEDED FOR WHEEZING AND FOR SHORTNESS OF BREATH 18 g 0   aspirin EC 81 MG tablet Take 81 mg by mouth daily. Swallow whole.     atorvastatin (LIPITOR) 20 MG tablet Take 1 tablet (20 mg total) by mouth daily. For cholesterol. 90 tablet 2   buPROPion ER (WELLBUTRIN SR) 100 MG 12 hr tablet TAKE 1 TABLET BY MOUTH TWICE DAILY FOR  DEPRESSION 180 tablet 0   carvedilol (COREG) 3.125 MG tablet Take 1 tablet (3.125 mg total) by mouth 2 (two) times daily. 180 tablet 0   ezetimibe (ZETIA) 10 MG tablet Take 1 tablet by mouth once daily 90 tablet 2   folic acid (FOLVITE) 1 MG tablet Take 1 tablet by mouth once daily 90 tablet 0   loratadine (CLARITIN) 10 MG tablet  Take 10 mg by mouth daily as needed for allergies.      meloxicam (MOBIC) 15 MG tablet Take 0.5-1 tablets (7.5-15 mg total) by mouth daily as needed for pain. 30 tablet 6   nystatin (MYCOSTATIN) 100000 UNIT/ML suspension Take 5 mLs by mouth 2 (two) times daily.     SYMBICORT 160-4.5 MCG/ACT inhaler Inhale 2 puffs into the lungs 2 (two) times daily. 33 g 0   tadalafil (CIALIS) 5 MG tablet Take 1 tablet (5 mg total) by mouth daily. For frequent urination 90 tablet 0   telmisartan (MICARDIS) 20 MG tablet Take 1 tablet by mouth once daily for blood pressure 90 tablet 0   No current facility-administered medications on file prior to visit.    BP (!) 148/90   Pulse 70   Temp (!) 97.2 F (36.2 C) (Temporal)   Ht 5' 5.5" (1.664 m)   Wt 196 lb (88.9 kg)   SpO2 99%   BMI 32.12 kg/m  Objective:   Physical Exam HENT:     Head:      Comments: Mild swelling noted to left preauricular region. Non tender. No masses.     Mouth/Throat:     Mouth: Mucous membranes are moist.     Dentition: Normal dentition.     Tongue: No lesions.     Palate: Lesions present.     Pharynx: Oropharynx is clear.      Comments: Dark red/purple colored lesion to hard palate of mouth. No drainage. No other lesions noted. Lymphadenopathy:     Cervical: No cervical adenopathy.  Neurological:     Mental Status: He is alert.           Assessment & Plan:   Problem List Items Addressed This Visit       Digestive   Oral lesion - Primary    Unclear etiology, but this does not appear to be normal. Given his history of tobacco dependence, further evaluation warranted.  He is already  established with ENT so I have advised him to call his ENT's office ASAP for an appointment/evaluation. He agrees.  Continue Nystatin for now, although I do not believe his lesion to be fungal.  He will update if he cannot get in with ENT.          Other   Left facial swelling    No obvious lymph node enlargement and no signs of parotitis.   Recommended he reveal this to his ENT provider as he will be calling for an appointment. Consider soft tissue US if ENT is unavailable.           Pleas Koch, NP

## 2021-12-17 ENCOUNTER — Other Ambulatory Visit: Payer: Self-pay | Admitting: Primary Care

## 2021-12-17 DIAGNOSIS — E785 Hyperlipidemia, unspecified: Secondary | ICD-10-CM

## 2021-12-29 ENCOUNTER — Encounter (HOSPITAL_BASED_OUTPATIENT_CLINIC_OR_DEPARTMENT_OTHER): Payer: Self-pay | Admitting: Otolaryngology

## 2021-12-30 ENCOUNTER — Encounter: Payer: Self-pay | Admitting: Internal Medicine

## 2021-12-30 ENCOUNTER — Encounter (HOSPITAL_BASED_OUTPATIENT_CLINIC_OR_DEPARTMENT_OTHER): Payer: Self-pay | Admitting: Otolaryngology

## 2021-12-30 NOTE — Progress Notes (Signed)
   12/30/21 0955  PAT Phone Screen  Is the patient taking a GLP-1 receptor agonist? No  Do You Have Diabetes? No  Do You Have Hypertension? Yes  Have You Ever Been to the ER for Asthma? No  Have You Taken Oral Steroids in the Past 3 Months? No  Do you Take Phenteramine or any Other Diet Drugs? No  Recent  Lab Work, EKG, CXR? (S)  Yes  Where was this test performed? e[facility] 04/28/21  Do you have a history of heart problems? (S)  Yes  Cardiologist Name Dr. Caryl Comes  Have you ever had tests on your heart? (S)  Yes  What cardiac tests were performed? (S)  EKG;Echo  What date/year were cardiac tests completed? in Epic  Results viewable: (S)  [facility] Media Tab  Any Recent Hospitalizations? No  Height '5\' 5"'$  (1.651 m)  Weight 81.6 kg  Pat Appointment Scheduled No  Reason for No Appointment Not Needed   LVM with angie at [facility] office for cardiac clearance and aspirin orders

## 2021-12-30 NOTE — Progress Notes (Signed)
Reviewed pacemaker sheet with Dr Kalman Shan, [facility] to proceed with surgery at [facility].

## 2021-12-30 NOTE — Progress Notes (Signed)
[facility] DEVICE PROGRAMMING  Patient Information: Name:  Brandon Lopez  DOB:  1955-03-23  MRN:  [id number]    Planned Procedure:  Hypoglossal Nerve Stimulator Insertion  Surgeon:  Dr Redmond Baseman  Date of Procedure:  01-05-2022  Cautery will be used.  Position during surgery:  supine   Please send documentation back to:  [place] (Fax # [phone])   Device Information:  Clinic EP Physician:  Virl Axe, MD   Device Type:  Pacemaker Manufacturer and Phone #:  Biotronik: [phone] Pacemaker Dependent?:  Unknown Date of Last Device Check:  12/09/2021 Normal Device Function?:  Yes.    Electrophysiologist's Recommendations:  Have magnet available. Provide continuous ECG monitoring when magnet is used or reprogramming is to be performed.  Procedure may interfere with device function.  Magnet should be placed over device during procedure.  Per Device Clinic Standing Orders, Simone Curia, RN  9:32 AM 12/30/2021

## 2021-12-31 ENCOUNTER — Telehealth: Payer: Self-pay

## 2021-12-31 ENCOUNTER — Telehealth: Payer: Self-pay | Admitting: Internal Medicine

## 2021-12-31 ENCOUNTER — Ambulatory Visit: Payer: PPO | Attending: Cardiovascular Disease | Admitting: Nurse Practitioner

## 2021-12-31 DIAGNOSIS — K1379 Other lesions of oral mucosa: Secondary | ICD-10-CM | POA: Diagnosis not present

## 2021-12-31 DIAGNOSIS — G4733 Obstructive sleep apnea (adult) (pediatric): Secondary | ICD-10-CM | POA: Diagnosis not present

## 2021-12-31 DIAGNOSIS — Z01810 Encounter for preprocedural cardiovascular examination: Secondary | ICD-10-CM

## 2021-12-31 DIAGNOSIS — Z6832 Body mass index (BMI) 32.0-32.9, adult: Secondary | ICD-10-CM | POA: Diagnosis not present

## 2021-12-31 NOTE — Telephone Encounter (Signed)
Patient scheduled for telephone visit today. Consent given, medications reviewed and all (if any) were answered.

## 2021-12-31 NOTE — Progress Notes (Signed)
Virtual Visit via Telephone Note   Because of Brandon Lopez's co-morbid illnesses, he is at least at moderate risk for complications without adequate follow up.  This format is felt to be most appropriate for this patient at this time.  The patient did not have access to video technology/had technical difficulties with video requiring transitioning to audio format only (telephone).  All issues noted in this document were discussed and addressed.  No physical exam could be performed with this format.  Please refer to the patient's chart for his consent to telehealth for [facility].  Evaluation Performed:  Preoperative cardiovascular risk assessment _____________   Date:  12/31/2021   Patient ID:  Brandon Lopez, DOB 11-20-1955, MRN [id number] Patient Location:  Home Provider location:   Office  Primary Care Provider:  Pleas Koch, NP Primary Cardiologist:  None  Chief Complaint / Patient Profile   66 y.o. y/o male with a h/o nonobstructive CAD, paroxysmal atrial fibrillation, symptomatic bradycardia s/p PPM, chronic diastolic heart failure, hypertension, orthostatic lightheadedness, COPD, tobacco use, and OSA who is pending Inspire implant with Dr. Melida Quitter and presents today for telephonic preoperative cardiovascular risk assessment.  History of Present Illness    Brandon Lopez is a 66 y.o. male who presents via audio/video conferencing for a telehealth visit today.  Pt was last seen in cardiology clinic on 04/28/2021 by Dr.Klein.  At that time Brandon Lopez was doing well.  The patient is now pending procedure as outlined above. Since his last visit, he has been stable from a cardiac standpoint.   He denies chest pain, palpitations, dyspnea, pnd, orthopnea, n, v, dizziness, syncope, edema, weight gain, or early satiety. All other systems reviewed and are otherwise negative except as noted above.   Past Medical History    Past Medical  History:  Diagnosis Date   Actinic keratosis    Arthritis    Chronic systolic CHF (congestive heart failure) ([place])    a. echo 6/16: EF 55-60%, Gr1DD, LA nl, PASP nl; b. echo 7/19: EF 40-45%, unable to exclude RWMA, Gr1DD, LA nl, RVSF nl, PASP nl   COPD (chronic obstructive pulmonary disease) (HCC)    Depression    Diverticulosis    Found during colonoscopy   History of kidney stones    Hypertension    PAF (paroxysmal atrial fibrillation) ([place])    a. short episodes previously noted on PPM interrogation; b. started on Xarelto 07/2017 given further episodes of Afib noted during interrogation; c. 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 Biotronik; Dayna Barker DR-T [id number]; placed 08/2012   Sleep apnea    does not use CPAP   Symptomatic bradycardia    a. s/p Biotronik; Dayna Barker DR-T [id number]; placed 08/2012 at [facility]   Past Surgical History:  Procedure Laterality Date   APPENDECTOMY  age 56   CARDIAC CATHETERIZATION     7 years ago   COLONOSCOPY N/A 05/27/2014   Procedure: COLONOSCOPY;  Surgeon: Hulen Luster, MD;  Location: [facility] ENDOSCOPY;  Service: Gastroenterology;  Laterality: N/A;   DRUG INDUCED ENDOSCOPY Bilateral 11/11/2021   Procedure: DRUG INDUCED ENDOSCOPY;  Surgeon: Melida Quitter, MD;  Location: [place];  Service: ENT;  Laterality: Bilateral;   INSERT / REPLACE / REMOVE PACEMAKER     3 years ago    Allergies  Allergies  Allergen Reactions   Simvastatin Diarrhea   Lisinopril Cough    Home Medications    Prior to Admission medications   Medication Sig Start Date End Date Taking? Authorizing Provider  albuterol (VENTOLIN HFA) 108 (90 Base) MCG/ACT inhaler INHALE 2 PUFFS BY MOUTH EVERY 6 HOURS AS NEEDED FOR WHEEZING AND FOR SHORTNESS OF BREATH 05/26/20   Waunita Schooner, MD  aspirin EC 81 MG tablet Take 81 mg by mouth daily. Swallow whole.    [provider]  atorvastatin (LIPITOR) 20 MG tablet TAKE 1 TABLET BY MOUTH ONCE DAILY FOR CHOLESTEROL 12/17/21   Pleas Koch, NP  buPROPion ER [facility] SR) 100 MG 12 hr tablet TAKE 1 TABLET BY MOUTH TWICE DAILY FOR DEPRESSION 10/11/21   Pleas Koch, NP  carvedilol (COREG) 3.125 MG tablet Take 1 tablet (3.125 mg total) by mouth 2 (two) times daily. 08/03/21   Deboraha Sprang, MD  ezetimibe (ZETIA) 10 MG tablet Take 1 tablet by mouth once daily 05/25/21   Minna Merritts, MD  folic acid (FOLVITE) 1 MG tablet Take 1 tablet by mouth once daily 12/04/21   Pleas Koch, NP  loratadine (CLARITIN) 10 MG tablet Take 10 mg by mouth daily as needed for allergies.     [provider]  meloxicam (MOBIC) 15 MG tablet Take 0.5-1 tablets (7.5-15 mg total) by mouth daily as needed for pain. 05/21/20   Hilts, Legrand Como, MD  SYMBICORT 160-4.5 MCG/ACT inhaler Inhale 2 puffs into the lungs 2 (two) times daily. 10/02/20   Pleas Koch, NP  tadalafil (CIALIS) 5 MG tablet Take 1 tablet (5 mg total) by mouth daily. For frequent urination 10/20/21   Pleas Koch, NP  telmisartan (MICARDIS) 20 MG tablet Take 1 tablet by mouth once daily for blood pressure 11/09/21   Pleas Koch, NP    Physical Exam    Vital Signs:  Brandon Lopez does not have vital signs available for review today.  Given telephonic nature of communication, physical exam is limited. AAOx3. NAD. Normal affect.  Speech and respirations are unlabored.  Accessory Clinical Findings    None  Assessment & Plan    1.  Preoperative Cardiovascular Risk Assessment:  According to the Revised Cardiac Risk Index (RCRI), his Perioperative Risk of Major Cardiac Event is (%): 0.9. His Functional Capacity in METs is: 7.01 according to the Duke Activity Status Index (DASI). Therefore, based on ACC/AHA guidelines, patient would be at acceptable risk for the planned procedure without further cardiovascular testing.    The patient was advised that if he develops new symptoms prior to surgery to contact our office to arrange for a follow-up visit, and he verbalized understanding.  Per office protocol, he may hold Aspirin for 5-7 days prior to procedure. Please resume Aspirin as soon as possible postprocedure, at the discretion of the surgeon.   A copy of this note will be routed to requesting surgeon.  Time:   Today, I have spent 4 minutes with the patient with telehealth technology discussing medical history, symptoms, and management plan.     Lenna Sciara, NP  12/31/2021, 2:37 PM

## 2021-12-31 NOTE — Telephone Encounter (Signed)
   Name: Brandon Lopez  DOB: 11-Mar-1955  MRN: [id number]  Primary Cardiologist: None   Preoperative team, please contact this patient and set up a phone call appointment for further preoperative risk assessment. Please obtain consent and complete medication review. Thank you for your help.  I confirm that guidance regarding antiplatelet and oral anticoagulation therapy has been completed and, if necessary, noted below.  Per office protocol, if patient is without any new symptoms or concerns at the time of their virtual visit, he may hold Aspirin for 5-7 days prior to procedure. Please resume Aspirin as soon as possible postprocedure, at the discretion of the surgeon.    Lenna Sciara, NP 12/31/2021, 10:13 AM [place]

## 2021-12-31 NOTE — Telephone Encounter (Signed)
  Patient Consent for Virtual Visit        Brandon Lopez has provided verbal consent on 12/31/2021 for a virtual visit (video or telephone).   CONSENT FOR VIRTUAL VISIT FOR:  Brandon Lopez  By participating in this virtual visit I agree to the following:  I hereby voluntarily request, consent and authorize [facility] and its employed or contracted physicians, physician assistants, nurse practitioners or other licensed health care professionals (the Practitioner), to provide me with telemedicine health care services (the "Services") as deemed necessary by the treating Practitioner. I acknowledge and consent to receive the Services by the Practitioner via telemedicine. I understand that the telemedicine visit will involve communicating with the Practitioner through live audiovisual communication technology and the disclosure of certain medical information by electronic transmission. I acknowledge that I have been given the opportunity to request an in-person assessment or other available alternative prior to the telemedicine visit and am voluntarily participating in the telemedicine visit.  I understand that I have the right to withhold or withdraw my consent to the use of telemedicine in the course of my care at any time, without affecting my right to future care or treatment, and that the Practitioner or I may terminate the telemedicine visit at any time. I understand that I have the right to inspect all information obtained and/or recorded in the course of the telemedicine visit and may receive copies of available information for a reasonable fee.  I understand that some of the potential risks of receiving the Services via telemedicine include:  Delay or interruption in medical evaluation due to technological equipment failure or disruption; Information transmitted may not be sufficient (e.g. poor resolution of images) to allow for appropriate medical decision making by the  Practitioner; and/or  In rare instances, security protocols could fail, causing a breach of personal health information.  Furthermore, I acknowledge that it is my responsibility to provide information about my medical history, conditions and care that is complete and accurate to the best of my ability. I acknowledge that Practitioner's advice, recommendations, and/or decision may be based on factors not within their control, such as incomplete or inaccurate data provided by me or distortions of diagnostic images or specimens that may result from electronic transmissions. I understand that the practice of medicine is not an exact science and that Practitioner makes no warranties or guarantees regarding treatment outcomes. I acknowledge that a copy of this consent can be made available to me via my patient portal ([place]), or I can request a printed copy by calling the office of [facility].    I understand that my insurance will be billed for this visit.   I have read or had this consent read to me. I understand the contents of this consent, which adequately explains the benefits and risks of the Services being provided via telemedicine.  I have been provided ample opportunity to ask questions regarding this consent and the Services and have had my questions answered to my satisfaction. I give my informed consent for the services to be provided through the use of telemedicine in my medical care

## 2021-12-31 NOTE — Telephone Encounter (Signed)
   Pre-operative Risk Assessment    Patient Name: Brandon Lopez  DOB: 1955-06-13 MRN: [id number]      Request for Surgical Clearance    Procedure:   Inspire Implant  Date of Surgery:  Clearance  01-05-22                                  Surgeon:   Dr Melida Quitter Surgeon's Group or Practice Name:   Phone number:    [phone] Fax number:  [phone]   Type of Clearance Requested:   - Medical  - Pharmacy:  Hold Aspirin     Type of Anesthesia:  General    Additional requests/questions:    SignedGlyn Ade   12/31/2021, 9:39 AM

## 2022-01-01 DIAGNOSIS — J441 Chronic obstructive pulmonary disease with (acute) exacerbation: Secondary | ICD-10-CM | POA: Diagnosis not present

## 2022-01-04 ENCOUNTER — Other Ambulatory Visit: Payer: Self-pay | Admitting: Internal Medicine

## 2022-01-04 NOTE — Anesthesia Preprocedure Evaluation (Signed)
Anesthesia Evaluation    Airway        Dental   Pulmonary sleep apnea , COPD,  COPD inhaler, former smoker          Cardiovascular hypertension, Pt. on home beta blockers + CAD and +CHF  + dysrhythmias + pacemaker      Neuro/Psych  PSYCHIATRIC DISORDERS Anxiety Depression       GI/Hepatic ,,,(+)     substance abuse    Endo/Other    Renal/GU      Musculoskeletal   Abdominal   Peds  Hematology   Anesthesia Other Findings Obstructive Sleep Apnea  BMI 32.0-32.9    Reproductive/Obstetrics                             Anesthesia Physical Anesthesia Plan Anesthesia Quick Evaluation

## 2022-01-04 NOTE — Progress Notes (Signed)
Remote pacemaker transmission.   

## 2022-01-05 ENCOUNTER — Encounter (HOSPITAL_BASED_OUTPATIENT_CLINIC_OR_DEPARTMENT_OTHER)
Admission: RE | Disposition: A | Discharge status: Home / Self Care | Payer: Self-pay | Source: Home / Self Care | Attending: Otolaryngology

## 2022-01-05 ENCOUNTER — Ambulatory Visit (HOSPITAL_BASED_OUTPATIENT_CLINIC_OR_DEPARTMENT_OTHER)
Admission: RE | Admit: 2022-01-05 | Discharge: 2022-01-05 | Disposition: A | Discharge status: Home / Self Care | Payer: PPO | Attending: Otolaryngology | Admitting: Otolaryngology

## 2022-01-05 ENCOUNTER — Encounter (HOSPITAL_BASED_OUTPATIENT_CLINIC_OR_DEPARTMENT_OTHER): Payer: Self-pay | Admitting: Otolaryngology

## 2022-01-05 ENCOUNTER — Other Ambulatory Visit: Payer: Self-pay

## 2022-01-05 ENCOUNTER — Ambulatory Visit (HOSPITAL_BASED_OUTPATIENT_CLINIC_OR_DEPARTMENT_OTHER): Payer: PPO | Admitting: Certified Registered"

## 2022-01-05 ENCOUNTER — Ambulatory Visit (HOSPITAL_COMMUNITY): Payer: PPO

## 2022-01-05 DIAGNOSIS — F419 Anxiety disorder, unspecified: Secondary | ICD-10-CM | POA: Insufficient documentation

## 2022-01-05 DIAGNOSIS — I2510 Atherosclerotic heart disease of native coronary artery without angina pectoris: Secondary | ICD-10-CM | POA: Diagnosis not present

## 2022-01-05 DIAGNOSIS — Z6829 Body mass index (BMI) 29.0-29.9, adult: Secondary | ICD-10-CM | POA: Insufficient documentation

## 2022-01-05 DIAGNOSIS — B370 Candidal stomatitis: Secondary | ICD-10-CM | POA: Insufficient documentation

## 2022-01-05 DIAGNOSIS — F32A Depression, unspecified: Secondary | ICD-10-CM | POA: Insufficient documentation

## 2022-01-05 DIAGNOSIS — I480 Paroxysmal atrial fibrillation: Secondary | ICD-10-CM | POA: Insufficient documentation

## 2022-01-05 DIAGNOSIS — I509 Heart failure, unspecified: Secondary | ICD-10-CM

## 2022-01-05 DIAGNOSIS — Z87891 Personal history of nicotine dependence: Secondary | ICD-10-CM

## 2022-01-05 DIAGNOSIS — B3789 Other sites of candidiasis: Secondary | ICD-10-CM | POA: Diagnosis not present

## 2022-01-05 DIAGNOSIS — I5022 Chronic systolic (congestive) heart failure: Secondary | ICD-10-CM | POA: Insufficient documentation

## 2022-01-05 DIAGNOSIS — E669 Obesity, unspecified: Secondary | ICD-10-CM | POA: Diagnosis not present

## 2022-01-05 DIAGNOSIS — Z969 Presence of functional implant, unspecified: Secondary | ICD-10-CM | POA: Diagnosis not present

## 2022-01-05 DIAGNOSIS — I110 Hypertensive heart disease with heart failure: Secondary | ICD-10-CM | POA: Diagnosis not present

## 2022-01-05 DIAGNOSIS — G4733 Obstructive sleep apnea (adult) (pediatric): Secondary | ICD-10-CM | POA: Insufficient documentation

## 2022-01-05 DIAGNOSIS — J449 Chronic obstructive pulmonary disease, unspecified: Secondary | ICD-10-CM | POA: Diagnosis not present

## 2022-01-05 DIAGNOSIS — Z950 Presence of cardiac pacemaker: Secondary | ICD-10-CM | POA: Insufficient documentation

## 2022-01-05 DIAGNOSIS — Z01818 Encounter for other preprocedural examination: Secondary | ICD-10-CM

## 2022-01-05 DIAGNOSIS — Z6832 Body mass index (BMI) 32.0-32.9, adult: Secondary | ICD-10-CM | POA: Diagnosis not present

## 2022-01-05 DIAGNOSIS — G4730 Sleep apnea, unspecified: Secondary | ICD-10-CM | POA: Diagnosis not present

## 2022-01-05 DIAGNOSIS — K121 Other forms of stomatitis: Secondary | ICD-10-CM | POA: Diagnosis not present

## 2022-01-05 HISTORY — PX: IMPLANTATION OF HYPOGLOSSAL NERVE STIMULATOR: SHX6827

## 2022-01-05 HISTORY — DX: Personal history of urinary calculi: Z87.442

## 2022-01-05 HISTORY — PX: FLOOR OF MOUTH BIOPSY: SHX5834

## 2022-01-05 SURGERY — INSERTION, HYPOGLOSSAL NERVE STIMULATOR
Anesthesia: General | Site: Mouth

## 2022-01-05 MED ORDER — OXYCODONE HCL 5 MG/5ML PO SOLN: 5.0000 mg | Freq: Once | ORAL | Status: DC | PRN

## 2022-01-05 MED ORDER — PHENYLEPHRINE HCL-NACL 20-0.9 MG/250ML-% IV SOLN
INTRAVENOUS | Status: DC | PRN
  Administered 2022-01-05: 20 ug/min via INTRAVENOUS

## 2022-01-05 MED ORDER — CEFAZOLIN SODIUM-DEXTROSE 2-4 GM/100ML-% IV SOLN
INTRAVENOUS | Status: AC
  Filled 2022-01-05: qty 100

## 2022-01-05 MED ORDER — FENTANYL CITRATE (PF) 100 MCG/2ML IJ SOLN
INTRAMUSCULAR | Status: DC | PRN
  Administered 2022-01-05: 50 ug via INTRAVENOUS

## 2022-01-05 MED ORDER — PHENYLEPHRINE HCL (PRESSORS) 10 MG/ML IV SOLN
INTRAVENOUS | Status: AC
  Filled 2022-01-05: qty 1

## 2022-01-05 MED ORDER — LIDOCAINE 2% (20 MG/ML) 5 ML SYRINGE
INTRAMUSCULAR | Status: AC
  Filled 2022-01-05: qty 15

## 2022-01-05 MED ORDER — LACTATED RINGERS IV SOLN: INTRAVENOUS | Status: DC

## 2022-01-05 MED ORDER — CEFAZOLIN SODIUM-DEXTROSE 2-4 GM/100ML-% IV SOLN
2.0000 g | INTRAVENOUS | Status: AC
  Administered 2022-01-05: 2 g via INTRAVENOUS

## 2022-01-05 MED ORDER — HYDROCODONE-ACETAMINOPHEN 5-325 MG PO TABS: 1.0000 | ORAL_TABLET | Freq: Four times a day (QID) | ORAL | 0 refills | Status: DC | PRN

## 2022-01-05 MED ORDER — DEXAMETHASONE SODIUM PHOSPHATE 10 MG/ML IJ SOLN
INTRAMUSCULAR | Status: AC
  Filled 2022-01-05: qty 2

## 2022-01-05 MED ORDER — MIDAZOLAM HCL 2 MG/2ML IJ SOLN
INTRAMUSCULAR | Status: AC
  Filled 2022-01-05: qty 2

## 2022-01-05 MED ORDER — PHENYLEPHRINE 80 MCG/ML (10ML) SYRINGE FOR IV PUSH (FOR BLOOD PRESSURE SUPPORT)
PREFILLED_SYRINGE | INTRAVENOUS | Status: AC
  Filled 2022-01-05: qty 10

## 2022-01-05 MED ORDER — DEXAMETHASONE SODIUM PHOSPHATE 4 MG/ML IJ SOLN
INTRAMUSCULAR | Status: DC | PRN
  Administered 2022-01-05: 4 mg via INTRAVENOUS

## 2022-01-05 MED ORDER — ACETAMINOPHEN 500 MG PO TABS
1000.0000 mg | ORAL_TABLET | Freq: Once | ORAL | Status: AC
  Administered 2022-01-05: 1000 mg via ORAL

## 2022-01-05 MED ORDER — ONDANSETRON HCL 4 MG/2ML IJ SOLN
INTRAMUSCULAR | Status: AC
  Filled 2022-01-05: qty 12

## 2022-01-05 MED ORDER — PROPOFOL 500 MG/50ML IV EMUL
INTRAVENOUS | Status: DC | PRN
  Administered 2022-01-05: 35 ug/kg/min via INTRAVENOUS

## 2022-01-05 MED ORDER — ONDANSETRON HCL 4 MG/2ML IJ SOLN
INTRAMUSCULAR | Status: DC | PRN
  Administered 2022-01-05: 4 mg via INTRAVENOUS

## 2022-01-05 MED ORDER — FENTANYL CITRATE (PF) 100 MCG/2ML IJ SOLN
INTRAMUSCULAR | Status: AC
  Filled 2022-01-05: qty 2

## 2022-01-05 MED ORDER — LIDOCAINE 2% (20 MG/ML) 5 ML SYRINGE
INTRAMUSCULAR | Status: DC | PRN
  Administered 2022-01-05: 60 mg via INTRAVENOUS

## 2022-01-05 MED ORDER — LIDOCAINE-EPINEPHRINE 1 %-1:100000 IJ SOLN
INTRAMUSCULAR | Status: DC | PRN
  Administered 2022-01-05: 4 mL

## 2022-01-05 MED ORDER — SUCCINYLCHOLINE CHLORIDE 200 MG/10ML IV SOSY
PREFILLED_SYRINGE | INTRAVENOUS | Status: AC
  Filled 2022-01-05: qty 30

## 2022-01-05 MED ORDER — OXYCODONE HCL 5 MG PO TABS: 5.0000 mg | ORAL_TABLET | Freq: Once | ORAL | Status: DC | PRN

## 2022-01-05 MED ORDER — AMISULPRIDE (ANTIEMETIC) 5 MG/2ML IV SOLN: 10.0000 mg | Freq: Once | INTRAVENOUS | Status: DC | PRN

## 2022-01-05 MED ORDER — ACETAMINOPHEN 500 MG PO TABS
ORAL_TABLET | ORAL | Status: AC
  Filled 2022-01-05: qty 2

## 2022-01-05 MED ORDER — PROPOFOL 10 MG/ML IV BOLUS
INTRAVENOUS | Status: DC | PRN
  Administered 2022-01-05: 150 mg via INTRAVENOUS

## 2022-01-05 MED ORDER — ONDANSETRON HCL 4 MG/2ML IJ SOLN: 4.0000 mg | Freq: Once | INTRAMUSCULAR | Status: DC | PRN

## 2022-01-05 MED ORDER — MIDAZOLAM HCL 5 MG/5ML IJ SOLN
INTRAMUSCULAR | Status: DC | PRN
  Administered 2022-01-05: 2 mg via INTRAVENOUS

## 2022-01-05 MED ORDER — FENTANYL CITRATE (PF) 100 MCG/2ML IJ SOLN: 25.0000 ug | INTRAMUSCULAR | Status: DC | PRN

## 2022-01-05 MED ORDER — 0.9 % SODIUM CHLORIDE (POUR BTL) OPTIME
TOPICAL | Status: DC | PRN
  Administered 2022-01-05: 1000 mL

## 2022-01-05 MED ORDER — SUCCINYLCHOLINE CHLORIDE 200 MG/10ML IV SOSY
PREFILLED_SYRINGE | INTRAVENOUS | Status: DC | PRN
  Administered 2022-01-05: 120 mg via INTRAVENOUS

## 2022-01-05 SURGICAL SUPPLY — 65 items
ACC NRSTM 4 TRQ WRNCH STRL (MISCELLANEOUS)
ADH SKN CLS APL DERMABOND .7 (GAUZE/BANDAGES/DRESSINGS) ×4
BLADE CLIPPER SURG (BLADE) IMPLANT
BLADE SURG 15 STRL LF DISP TIS (BLADE) ×2 IMPLANT
BLADE SURG 15 STRL SS (BLADE) ×2
CANISTER SUCT 1200ML W/VALVE (MISCELLANEOUS) ×2 IMPLANT
CORD BIPOLAR FORCEPS 12FT (ELECTRODE) ×2 IMPLANT
COVER PROBE CYLINDRICAL 5X96 (MISCELLANEOUS) ×2 IMPLANT
DERMABOND ADVANCED .7 DNX12 (GAUZE/BANDAGES/DRESSINGS) ×4 IMPLANT
DRAPE C-ARM 35X43 STRL (DRAPES) ×2 IMPLANT
DRAPE HEAD BAR (DRAPES) IMPLANT
DRAPE INCISE IOBAN 66X45 STRL (DRAPES) ×2 IMPLANT
DRAPE MICROSCOPE WILD 40.5X102 (DRAPES) ×2 IMPLANT
DRAPE UTILITY XL STRL (DRAPES) ×2 IMPLANT
DRSG TEGADERM 2-3/8X2-3/4 SM (GAUZE/BANDAGES/DRESSINGS) ×4 IMPLANT
DRSG TEGADERM 4X4.75 (GAUZE/BANDAGES/DRESSINGS) IMPLANT
ELECT COATED BLADE 2.86 ST (ELECTRODE) ×2 IMPLANT
ELECT EMG 18 NIMS (NEUROSURGERY SUPPLIES) ×2
ELECT REM PT RETURN 9FT ADLT (ELECTROSURGICAL) ×2
ELECTRODE EMG 18 NIMS (NEUROSURGERY SUPPLIES) ×2 IMPLANT
ELECTRODE REM PT RTRN 9FT ADLT (ELECTROSURGICAL) ×2 IMPLANT
FORCEPS BIPOLAR SPETZLER 8 1.0 (NEUROSURGERY SUPPLIES) ×2 IMPLANT
GAUZE 4X4 16PLY ~~LOC~~+RFID DBL (SPONGE) ×2 IMPLANT
GAUZE SPONGE 4X4 12PLY STRL (GAUZE/BANDAGES/DRESSINGS) ×2 IMPLANT
GENERATOR PULSE INSPIRE (Generator) ×2 IMPLANT
GENERATOR PULSE INSPIRE IV (Generator) ×2 IMPLANT
GLOVE BIO SURGEON STRL SZ 6.5 (GLOVE) IMPLANT
GLOVE BIO SURGEON STRL SZ7.5 (GLOVE) ×2 IMPLANT
GOWN STRL REUS W/ TWL LRG LVL3 (GOWN DISPOSABLE) ×6 IMPLANT
GOWN STRL REUS W/TWL LRG LVL3 (GOWN DISPOSABLE) ×6
IV CATH 18G SAFETY (IV SOLUTION) ×2 IMPLANT
KIT NEURO ACCESSORY W/WRENCH (MISCELLANEOUS) IMPLANT
LEAD SENSING RESP INSPIRE (Lead) ×2 IMPLANT
LEAD SENSING RESP INSPIRE IV (Lead) ×2 IMPLANT
LEAD SLEEP STIM INSPIRE IV/V (Lead) ×2 IMPLANT
LEAD SLEEP STIMULATION INSPIRE (Lead) ×2 IMPLANT
LOOP VASCULAR MINI 18 RED (MISCELLANEOUS) ×2
LOOP VESSEL MAXI BLUE (MISCELLANEOUS) ×2 IMPLANT
MARKER SKIN DUAL TIP RULER LAB (MISCELLANEOUS) ×2 IMPLANT
NDL HYPO 25X1 1.5 SAFETY (NEEDLE) ×2 IMPLANT
NEEDLE HYPO 25X1 1.5 SAFETY (NEEDLE) ×2 IMPLANT
NS IRRIG 1000ML POUR BTL (IV SOLUTION) ×2 IMPLANT
PACK BASIN DAY SURGERY FS (CUSTOM PROCEDURE TRAY) ×2 IMPLANT
PACK ENT DAY SURGERY (CUSTOM PROCEDURE TRAY) ×2 IMPLANT
PASSER CATH 36 CODMAN DISP (NEUROSURGERY SUPPLIES) IMPLANT
PASSER CATH 38CM DISP (INSTRUMENTS) IMPLANT
PENCIL SMOKE EVACUATOR (MISCELLANEOUS) ×2 IMPLANT
PROBE NERVE STIMULATOR (NEUROSURGERY SUPPLIES) ×2 IMPLANT
REMOTE CONTROL SLEEP INSPIRE (MISCELLANEOUS) ×2 IMPLANT
SET WALTER ACTIVATION W/DRAPE (SET/KITS/TRAYS/PACK) ×2 IMPLANT
SLEEVE SCD COMPRESS KNEE MED (STOCKING) ×2 IMPLANT
SPONGE INTESTINAL PEANUT (DISPOSABLE) ×2 IMPLANT
SUT SILK 2 0 SH (SUTURE) ×2 IMPLANT
SUT SILK 3 0 REEL (SUTURE) ×2 IMPLANT
SUT SILK 3 0 SH 30 (SUTURE) ×2 IMPLANT
SUT SILK 3-0 (SUTURE) ×2
SUT SILK 3-0 RB1 30XBRD (SUTURE) ×2
SUT VIC AB 3-0 SH 27 (SUTURE) ×4
SUT VIC AB 3-0 SH 27X BRD (SUTURE) ×4 IMPLANT
SUT VIC AB 4-0 PS2 27 (SUTURE) ×4 IMPLANT
SUTURE SILK 3-0 RB1 30XBRD (SUTURE) ×2 IMPLANT
SYR 10ML LL (SYRINGE) ×2 IMPLANT
SYR BULB EAR ULCER 3OZ GRN STR (SYRINGE) ×2 IMPLANT
TOWEL GREEN STERILE FF (TOWEL DISPOSABLE) ×4 IMPLANT
VASCULAR TIE MINI RED 18IN STL (MISCELLANEOUS) ×2 IMPLANT

## 2022-01-05 NOTE — Anesthesia Preprocedure Evaluation (Signed)
Anesthesia Evaluation  Patient identified by MRN, date of birth, ID band Patient awake    Reviewed: Allergy & Precautions, NPO status , Patient's Chart, lab work & pertinent test results  History of Anesthesia Complications Negative for: history of anesthetic complications  Airway Mallampati: III  TM Distance: >3 FB Neck ROM: Full    Dental  (+) Dental Advisory Given, Teeth Intact   Pulmonary sleep apnea , COPD, former smoker   Pulmonary exam normal        Cardiovascular METS: 5 - 7 Mets hypertension, + CAD and +CHF (EF recovered)  Normal cardiovascular exam+ dysrhythmias (symptomatic bradycardia) Atrial Fibrillation + pacemaker   Echo 04/22/21: EF 60-65%, g1dd, mild MR   Neuro/Psych   Anxiety Depression    negative neurological ROS     GI/Hepatic negative GI ROS, Neg liver ROS,,,  Endo/Other  negative endocrine ROS    Renal/GU negative Renal ROS  negative genitourinary   Musculoskeletal negative musculoskeletal ROS (+)    Abdominal   Peds  Hematology negative hematology ROS (+)   Anesthesia Other Findings   Reproductive/Obstetrics                             Anesthesia Physical Anesthesia Plan  ASA: 3  Anesthesia Plan: General   Post-op Pain Management: Tylenol PO (pre-op)* and Toradol IV (intra-op)*   Induction: Intravenous  PONV Risk Score and Plan: 2 and Ondansetron, Dexamethasone, Treatment may vary due to age or medical condition and Midazolam  Airway Management Planned: Oral ETT  Additional Equipment: None  Intra-op Plan:   Post-operative Plan: Extubation in OR  Informed Consent: I have reviewed the patients History and Physical, chart, labs and discussed the procedure including the risks, benefits and alternatives for the proposed anesthesia with the patient or authorized representative who has indicated his/her understanding and acceptance.     Dental advisory  given  Plan Discussed with:   Anesthesia Plan Comments: (Will have magnet available for pacemaker in case of surgical interference.)        Anesthesia Quick Evaluation

## 2022-01-05 NOTE — H&P (Signed)
Brandon Lopez is an 66 y.o. male.   Chief Complaint: Sleep apnea, palate lesion HPI: 66 year old male with obstructive sleep apnea who has not tolerated CPAP and was also found to have a palate lesion by his dentist.  Past Medical History:  Diagnosis Date   Actinic keratosis    Arthritis    Chronic systolic CHF (congestive heart failure) ([place])    a. echo 6/16: EF 55-60%, Gr1DD, LA nl, PASP nl; b. echo 7/19: EF 40-45%, unable to exclude RWMA, Gr1DD, LA nl, RVSF nl, PASP nl   COPD (chronic obstructive pulmonary disease) (H[facility])    Depression    Diverticulosis    Found during colonoscopy   History of kidney stones    Hypertension    PAF (paroxysmal atrial fibrillation) ([place])    a. short episodes previously noted on PPM interrogation; b. started on Xarelto 07/2017 given further episodes of Afib noted during interrogation; c. 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 Biotronik; Dayna Barker DR-T [id number]; placed 08/2012   Sleep apnea    does not use CPAP   Symptomatic bradycardia    a. s/p Biotronik; Dayna Barker DR-T [id number]; placed 08/2012 at [facility]    Past Surgical History:  Procedure Laterality Date   APPENDECTOMY  age 77   CARDIAC CATHETERIZATION     7 years ago   COLONOSCOPY N/A 05/27/2014   Procedure: COLONOSCOPY;  Surgeon: Hulen Luster, MD;  Location: [facility] ENDOSCOPY;  Service: Gastroenterology;  Laterality: N/A;   DRUG INDUCED ENDOSCOPY Bilateral 11/11/2021   Procedure: DRUG INDUCED ENDOSCOPY;  Surgeon: Melida Quitter, MD;  Location: [place];  Service: ENT;  Laterality: Bilateral;   INSERT / REPLACE / REMOVE PACEMAKER     3 years ago    Family History  Problem Relation Age of Onset   Arthritis Mother    Heart disease Father    Hyperlipidemia Father    Hypertension Father    Cancer Paternal Grandfather    Social History:  reports that he quit  smoking about 11 months ago. His smoking use included cigarettes. He started smoking about 59 years ago. He has a 100.00 pack-year smoking history. He has never used smokeless tobacco. He reports current alcohol use of about 8.0 - 14.0 standard drinks of alcohol per week. He reports current drug use. Drug: Marijuana.  Allergies:  Allergies  Allergen Reactions   Simvastatin Diarrhea   Lisinopril Cough    Medications Prior to Admission  Medication Sig Dispense Refill   aspirin EC 81 MG tablet Take 81 mg by mouth daily. Swallow whole.     atorvastatin (LIPITOR) 20 MG tablet TAKE 1 TABLET BY MOUTH ONCE DAILY FOR CHOLESTEROL 90 tablet 0   buPROPion ER (WELLBUTRIN SR) 100 MG 12 hr tablet TAKE 1 TABLET BY MOUTH TWICE DAILY FOR DEPRESSION 180 tablet 0   carvedilol (COREG) 3.125 MG tablet Take 1 tablet by mouth twice daily 180 tablet 0   ezetimibe (ZETIA) 10 MG tablet Take 1 tablet by mouth once daily 90 tablet 2   folic acid (FOLVITE) 1 MG tablet Take 1 tablet by mouth once daily 90 tablet 0   meloxicam (MOBIC) 15 MG tablet Take 0.5-1 tablets (7.5-15 mg total) by mouth daily as needed for pain. 30 tablet 6   SYMBICORT 160-4.5 MCG/ACT inhaler Inhale 2 puffs into the lungs 2 (two) times  daily. 33 g 0   tadalafil (CIALIS) 5 MG tablet Take 1 tablet (5 mg total) by mouth daily. For frequent urination 90 tablet 0   telmisartan (MICARDIS) 20 MG tablet Take 1 tablet by mouth once daily for blood pressure 90 tablet 0   albuterol (VENTOLIN HFA) 108 (90 Base) MCG/ACT inhaler INHALE 2 PUFFS BY MOUTH EVERY 6 HOURS AS NEEDED FOR WHEEZING AND FOR SHORTNESS OF BREATH 18 g 0   loratadine (CLARITIN) 10 MG tablet Take 10 mg by mouth daily as needed for allergies.       No results found for this or any previous visit (from the past 48 hour(s)). No results found.  Review of Systems  All other systems reviewed and are negative.   Height '5\' 5"'$  (1.651 m), weight 81.6 kg. Physical Exam Constitutional:       Appearance: Normal appearance. He is normal weight.  HENT:     Head: Normocephalic and atraumatic.     Right Ear: External ear normal.     Left Ear: External ear normal.     Nose: Nose normal.     Mouth/Throat:     Comments: Granular patch of central palate mucosa. Eyes:     Extraocular Movements: Extraocular movements intact.     Conjunctiva/sclera: Conjunctivae normal.     Pupils: Pupils are equal, round, and reactive to light.  Cardiovascular:     Rate and Rhythm: Normal rate.  Pulmonary:     Effort: Pulmonary effort is normal.  Musculoskeletal:     Cervical back: Normal range of motion.  Skin:    General: Skin is warm and dry.  Neurological:     General: No focal deficit present.     Mental Status: He is alert and oriented to person, place, and time.  Psychiatric:        Mood and Affect: Mood normal.        Behavior: Behavior normal.        Thought Content: Thought content normal.        Judgment: Judgment normal.      Assessment/Plan Obstructive sleep apnea, BMI 29.95, palate lesion  To OR for hypoglossal nerve stimulator placement and palate biopsy.  Melida Quitter, MD 01/05/2022, 11:38 AM

## 2022-01-05 NOTE — Brief Op Note (Signed)
01/05/2022  1:39 PM  PATIENT:  Brandon Lopez  67 y.o. male  PRE-OPERATIVE DIAGNOSIS:  Obstructive Sleep Apnea BMI 32.0-32.9,adult, Palate lesion  POST-OPERATIVE DIAGNOSIS:  Obstructive Sleep ApneaBMI 32.0-32.9,adult, Palate lesion  PROCEDURE:  Procedure(s): IMPLANTATION OF HYPOGLOSSAL NERVE STIMULATOR (Left) BIOPSY OF PALATE (N/A)  SURGEON:  Surgeon(s) and Role:    Melida Quitter, MD - Primary  PHYSICIAN ASSISTANT: Sallee Provencal  ASSISTANTS: none   ANESTHESIA:   general  EBL:  Minimal   BLOOD ADMINISTERED:none  DRAINS: none   LOCAL MEDICATIONS USED:  LIDOCAINE   SPECIMEN:  Source of Specimen:  Palate biopsy  DISPOSITION OF SPECIMEN:  PATHOLOGY  COUNTS:  YES  TOURNIQUET:  * No tourniquets in log *  DICTATION: .Note written in EPIC  PLAN OF CARE: Discharge to home after PACU  PATIENT DISPOSITION:  PACU - hemodynamically stable.   Delay start of Pharmacological VTE agent (>24hrs) due to surgical blood loss or risk of bleeding: no

## 2022-01-05 NOTE — Anesthesia Postprocedure Evaluation (Signed)
Anesthesia Post Note  Patient: Brandon Lopez  Procedure(s) Performed: IMPLANTATION OF HYPOGLOSSAL NERVE STIMULATOR (Left: Chest) BIOPSY OF PALATE (Mouth)     Patient location during evaluation: PACU Anesthesia Type: General Level of consciousness: awake and alert Pain management: pain level controlled Vital Signs Assessment: post-procedure vital signs reviewed and stable Respiratory status: spontaneous breathing, nonlabored ventilation and respiratory function stable Cardiovascular status: blood pressure returned to baseline and stable Postop Assessment: no apparent nausea or vomiting Anesthetic complications: no   No notable events documented.  Last Vitals:  Vitals:   01/05/22 1415 01/05/22 1430  BP: (!) 109/90 108/80  Pulse: 84 76  Resp: 13 18  Temp:    SpO2: 97% 91%    Last Pain:  Vitals:   01/05/22 1430  PainSc: 0-No pain                 Lidia Collum

## 2022-01-05 NOTE — Op Note (Signed)
PREOPERATIVE DIAGNOSIS:  Obstructive sleep apnea.   POSTOPERATIVE DIAGNOSIS:  Obstructive sleep apnea.   PROCEDURE:  Placement of hypoglossal nerve stimulator including placement of sensor lead and testing of stimulator.   SURGEON:  Melida Quitter, MD   ASSISTANT:  Jolene Provost, PA, who was necessary for assistance with retraction and closure.   ANESTHESIA:  General endotracheal anesthesia.   COMPLICATIONS:  None.   INDICATIONS:  The patient is a 66 year old male with a history of obstructive sleep apnea who has not been able to tolerate CPAP.  In addition, he was recently found to have a granular lesion of the central palate.  He presents to the operating room for placement of hypoglossal nerve stimulator and palate biopsy.   FINDINGS:  The central hard palate mucosa was granular-appearing in a demarcated patch.  Surgical anatomy was unremarkable for the device placement. The device was placed in the left side to avoid his right-sided pacemaker. The device was tested intraoperatively and demonstrated excellent stimulation and sensor lead function.   DESCRIPTION OF PROCEDURE:  The patient was identified in the holding room, informed consent having been obtained, including discussion of risks, benefits and alternatives, the patient was brought to the operative suite and put on the operative table in  supine position.  Anesthesia was induced and the patient was intubated by the anesthesia team without difficulty.  The patient was given intravenous antibiotics during the case.  The eyes were taped closed and the bed was turned 180 degrees from  anesthesia and a shoulder roll was placed.  The left neck and right chest incisions were marked with a marking pen after measuring and injected with 1% lidocaine with 1:100,000 epinephrine.  The nerve integrity monitor was placed in the right lateral  tongue and floor of mouth and turned on during the case.  Biopsies were taken from the central palate  mucosa using cup forceps and passed to nursing for pathology.  The left neck and chest were prepped and draped in sterile fashion.  The neck incision was made with a 15 blade scalpel and extended through subcutaneous tissue to the platysmal layer using Bovie  electrocautery.  Dissection was then extended down the platysma layer somewhat until it was divided more inferiorly.  This allowed direct dissection down onto the lower portion of the submandibular gland and ultimately the digastric tendon.  The tendon  was retracted inferiorly with two vessel loops.  Further dissection along the digastric exposed the mylohyoid muscle, which was then retracted anteriorly exposing the hypoglossal nerve.  The fascia over the nerve was then divided using bipolar  electrocautery to control bleeding.  The various branches of the nerve were then identified including the C1 branch and the more distal branches off of the main trunk.  Nerve stimulator was then used to identify which branches would be included as  protrusion branches and excluded as retrusion branches.  The break point between that on the superior surface of the nerve was then identified and the inclusion portion of the nerve was then elevated allowing pocket for cuff placement.  The cuff was then  brought into the field and placed around the inclusion branches and properly positioned.  Saline was then injected under the cuff.  The anchor for this lead was then sutured to the digastric tendon using 3-0 silk suture in 2 positions.  The stimulating lead was  then fully placed in the neck and covered with a damp gauze.  The infraclavicular incision was made using a 15  blade scalpel and extended through the subcutaneous tissues using Bovie electrocautery down to the pectoralis fascia.  A pocket was then  created inferiorly for the generator.  2 stay sutures were then placed at the superior lateral extent of the wound using 2-0 silk suture and these were left in place.   Dissection was then extended through  the pectoralis major muscle overlying the second intercostal space.  Fatty tissue deep to the muscle was swept exposing the external intercostal muscle.  This muscle was dissected more medially exposing the internal intercostal muscle.  A retractor was then gently placed between those muscles running posteriorly and the sensor lead was then placed into that pocket easily.  The anchor was then sutured with 3-0 silk suture in 3 positions.  The other anchor was then secured on the pectoralis muscle using 3-0 silk in 2 positions.  The neck incision was then uncovered and the lead unraveled.  The tunneling pocket was started with a hemostat under the platysma muscle and extended with the tunneling down over the clavicle to the generator pocket and the stimulating lead was then pulled into  the generator pocket with a little bit of slack left in the neck.  Both leads were then cleaned off with damp gauze and dried.  Each one was placed into the generator tightening down the screws to 2 clicks on both occasions.  Both leads were tugged and  found to be in good position.  The generator was then positioned into the generator pocket and testing then commenced.  After testing demonstrated good stimulation and good sensor lead function, each wound was copiously irrigated with saline.  The chest wound was closed in the deep tissues with 3-0 Vicryl suture in simple interrupted fashion and in the subcutaneous layer using 4-0 Vicryl suture in a simple interrupted fashion.  The neck incision was closed in the platysma layer with 3-0 Vicryl suture in a  simple interrupted fashion and then in the subcutaneous layer using 4-0 Vicryl suture in a simple interrupted fashion.  Both incisions were covered with Dermabond.  The patient was then cleaned off and drapes were removed.  The nerve integrity monitor  was removed.  Each incision was then covered with gauze pressure dressing.  The patient  was then returned to anesthesia for wakeup and was extubated and moved to the recovery room in stable condition.

## 2022-01-05 NOTE — Transfer of Care (Signed)
Immediate Anesthesia Transfer of Care Note  Patient: Brandon Lopez  Procedure(s) Performed: IMPLANTATION OF HYPOGLOSSAL NERVE STIMULATOR (Left: Chest) BIOPSY OF PALATE (Mouth)  Patient Location: PACU  Anesthesia Type:General  Level of Consciousness: awake, alert , oriented, drowsy, and patient cooperative  Airway & Oxygen Therapy: Patient Spontanous Breathing and Patient connected to face mask oxygen  Post-op Assessment: Report given to RN and Post -op Vital signs reviewed and stable  Post vital signs: Reviewed and stable  Last Vitals:  Vitals Value Taken Time  BP    Temp    Pulse 85 01/05/22 1358  Resp 17 01/05/22 1358  SpO2 98 % 01/05/22 1358  Vitals shown include unvalidated device data.  Last Pain: There were no vitals filed for this visit.       Complications: No notable events documented.

## 2022-01-05 NOTE — Anesthesia Procedure Notes (Signed)
Procedure Name: Intubation Date/Time: 01/05/2022 12:05 PM  Performed by: Lyda Colcord, Ernesta Amble, CRNAPre-anesthesia Checklist: Patient identified, Emergency Drugs available, Suction available and Patient being monitored Patient Re-evaluated:Patient Re-evaluated prior to induction Oxygen Delivery Method: Circle system utilized Preoxygenation: Pre-oxygenation with 100% oxygen Induction Type: IV induction Ventilation: Mask ventilation without difficulty Laryngoscope Size: Mac and 3 Grade View: Grade I Tube type: Oral Tube size: 7.5 mm Number of attempts: 1 Airway Equipment and Method: Stylet and Oral airway Placement Confirmation: ETT inserted through vocal cords under direct vision, positive ETCO2 and breath sounds checked- equal and bilateral Secured at: 22 cm Tube secured with: Tape Dental Injury: Teeth and Oropharynx as per pre-operative assessment

## 2022-01-05 NOTE — Discharge Instructions (Addendum)
  Post Anesthesia Home Care Instructions  Activity: Get plenty of rest for the remainder of the day. A responsible individual must stay with you for 24 hours following the procedure.  For the next 24 hours, DO NOT: -Drive a car -Paediatric nurse -Drink alcoholic beverages -Take any medication unless instructed by your physician -Make any legal decisions or sign important papers.  Meals: Start with liquid foods such as gelatin or soup. Progress to regular foods as tolerated. Avoid greasy, spicy, heavy foods. If nausea and/or vomiting occur, drink only clear liquids until the nausea and/or vomiting subsides. Call your physician if vomiting continues.  Special Instructions/Symptoms: Your throat may feel dry or sore from the anesthesia or the breathing tube placed in your throat during surgery. If this causes discomfort, gargle with warm salt water. The discomfort should disappear within 24 hours.  If you had a scopolamine patch placed behind your ear for the management of post- operative nausea and/or vomiting:  1. The medication in the patch is effective for 72 hours, after which it should be removed.  Wrap patch in a tissue and discard in the trash. Wash hands thoroughly with soap and water. 2. You may remove the patch earlier than 72 hours if you experience unpleasant side effects which may include dry mouth, dizziness or visual disturbances. 3. Avoid touching the patch. Wash your hands with soap and water after contact with the patch.    *May have Tylenol today at 4:30pm 01/05/2022

## 2022-01-06 ENCOUNTER — Encounter (HOSPITAL_BASED_OUTPATIENT_CLINIC_OR_DEPARTMENT_OTHER): Payer: Self-pay | Admitting: Otolaryngology

## 2022-01-06 DIAGNOSIS — J449 Chronic obstructive pulmonary disease, unspecified: Secondary | ICD-10-CM

## 2022-01-06 LAB — SURGICAL PATHOLOGY

## 2022-01-06 NOTE — Telephone Encounter (Signed)
Mendel Ryder, can you assist?

## 2022-01-06 NOTE — Progress Notes (Signed)
Left message stating courtesy call and if any questions or concerns please call the doctors office.  

## 2022-01-07 NOTE — Telephone Encounter (Signed)
Unfortunately Symbicort was taken off the [place] assistance list of meds so he will not be able to get it through that program anymore. Only Bevespi and Judithann Sauger are still available through the program - if you want to switch inhalers I can re-enroll him in the program for free medication. Otherwise he will have to get Symbicort at his regular pharmacy and pay his copay.

## 2022-01-08 NOTE — Telephone Encounter (Signed)
Brandon Lopez, I spoke with patient and he is okay if we switch his regimen. His Symbicort will ultimately become cost prohibitive. I believe he would benefit from [place]. Can you start the assistance paperwork?

## 2022-01-11 NOTE — Telephone Encounter (Signed)
Enrolled patient in [place] for Breztri via online portal, dates of enrollment are 01/11/22 - 01/25/23. All that is left is for eRx to be sent to [facility] mail order pharmacy.  Routing to PCP for eRx.

## 2022-01-12 ENCOUNTER — Encounter: Payer: Self-pay | Admitting: Primary Care

## 2022-01-12 ENCOUNTER — Ambulatory Visit (INDEPENDENT_AMBULATORY_CARE_PROVIDER_SITE_OTHER): Payer: PPO | Admitting: Primary Care

## 2022-01-12 VITALS — BP 136/84 | HR 78 | Temp 97.2°F | Ht 65.0 in | Wt 199.0 lb

## 2022-01-12 DIAGNOSIS — I10 Essential (primary) hypertension: Secondary | ICD-10-CM

## 2022-01-12 DIAGNOSIS — J449 Chronic obstructive pulmonary disease, unspecified: Secondary | ICD-10-CM

## 2022-01-12 DIAGNOSIS — I700 Atherosclerosis of aorta: Secondary | ICD-10-CM

## 2022-01-12 DIAGNOSIS — F32A Depression, unspecified: Secondary | ICD-10-CM

## 2022-01-12 DIAGNOSIS — Z125 Encounter for screening for malignant neoplasm of prostate: Secondary | ICD-10-CM

## 2022-01-12 DIAGNOSIS — I480 Paroxysmal atrial fibrillation: Secondary | ICD-10-CM

## 2022-01-12 DIAGNOSIS — E785 Hyperlipidemia, unspecified: Secondary | ICD-10-CM

## 2022-01-12 DIAGNOSIS — K219 Gastro-esophageal reflux disease without esophagitis: Secondary | ICD-10-CM

## 2022-01-12 DIAGNOSIS — F1010 Alcohol abuse, uncomplicated: Secondary | ICD-10-CM

## 2022-01-12 DIAGNOSIS — N529 Male erectile dysfunction, unspecified: Secondary | ICD-10-CM | POA: Diagnosis not present

## 2022-01-12 DIAGNOSIS — Z0001 Encounter for general adult medical examination with abnormal findings: Secondary | ICD-10-CM

## 2022-01-12 DIAGNOSIS — G4733 Obstructive sleep apnea (adult) (pediatric): Secondary | ICD-10-CM

## 2022-01-12 DIAGNOSIS — R3912 Poor urinary stream: Secondary | ICD-10-CM

## 2022-01-12 DIAGNOSIS — Z950 Presence of cardiac pacemaker: Secondary | ICD-10-CM

## 2022-01-12 DIAGNOSIS — Z23 Encounter for immunization: Secondary | ICD-10-CM

## 2022-01-12 DIAGNOSIS — F419 Anxiety disorder, unspecified: Secondary | ICD-10-CM | POA: Diagnosis not present

## 2022-01-12 LAB — LIPID PANEL
Cholesterol: 153 mg/dL (ref 0–200)
HDL: 41.1 mg/dL (ref >39.00)
NonHDL: 111.45
Total CHOL/HDL Ratio: 4
Triglycerides: 259 mg/dL — ABNORMAL HIGH (ref 0.0–149.0)
VLDL: 51.8 mg/dL — ABNORMAL HIGH (ref 0.0–40.0)

## 2022-01-12 LAB — PSA, MEDICARE: PSA: 0.24 ng/ml (ref 0.10–4.00)

## 2022-01-12 LAB — HEMOGLOBIN A1C: Hgb A1c MFr Bld: 5.6 % (ref 4.6–6.5)

## 2022-01-12 LAB — LDL CHOLESTEROL, DIRECT: Direct LDL: 84 mg/dL

## 2022-01-12 MED ORDER — ALBUTEROL SULFATE HFA 108 (90 BASE) MCG/ACT IN AERS: INHALATION_SPRAY | RESPIRATORY_TRACT | 0 refills | Status: DC

## 2022-01-12 MED ORDER — BUPROPION HCL ER (SR) 150 MG PO TB12: 150.0000 mg | ORAL_TABLET | Freq: Two times a day (BID) | ORAL | 0 refills | Status: DC

## 2022-01-12 MED ORDER — BREZTRI AEROSPHERE 160-9-4.8 MCG/ACT IN AERO: 2.0000 | INHALATION_SPRAY | Freq: Two times a day (BID) | RESPIRATORY_TRACT | 11 refills | Status: DC

## 2022-01-12 NOTE — Assessment & Plan Note (Signed)
Immunizations UTD. Influenza vaccine provided today.  Discussed to obtain Shingrix vaccine to the pharmacy. PSA due and pending. Colonoscopy up-to-date, due 2026. Lung cancer screening up-to-date.  Discussed the importance of a healthy diet and regular exercise in order for weight loss, and to reduce the risk of further co-morbidity.  Exam stable. Labs pending.  Follow up in 1 year for repeat physical.

## 2022-01-12 NOTE — Assessment & Plan Note (Signed)
Continue atorvastatin 20 mg daily. Repeat lipid panel pending. 

## 2022-01-12 NOTE — Telephone Encounter (Signed)
Noted, Rx for Breztri inhaler sent to mail order pharmacy. Thanks!

## 2022-01-12 NOTE — Assessment & Plan Note (Signed)
Reviewed remote pacer check from November 2023.

## 2022-01-12 NOTE — Assessment & Plan Note (Signed)
Suspect recent vomiting episodes are secondary to GERD, especially given improvement with Pepcid. Discussed triggers of GERD which includes alcohol consumption, acidic/spicy foods, laying flat within 2 hours after eating.  Recommended he cut back on alcohol use.  Start famotidine 20 mg at bedtime for now. He will update if no improvement.

## 2022-01-12 NOTE — Patient Instructions (Signed)
Stop by the lab prior to leaving today. I will notify you of your results once received.   We increased the dose of your bupropion SR to 150 mg twice daily. Please think about seeing psychiatry and therapy.  Start the new inhaler once your Symbicort is out.  Start taking famotidine (Pepcid) every night at bedtime for heartburn.  It was a pleasure to see you today!  Preventive Care 24 Years and Older, Male Preventive care refers to lifestyle choices and visits with your health care provider that can promote health and wellness. Preventive care visits are also called wellness exams. What can I expect for my preventive care visit? Counseling During your preventive care visit, your health care provider may ask about your: Medical history, including: Past medical problems. Family medical history. History of falls. Current health, including: Emotional well-being. Home life and relationship well-being. Sexual activity. Memory and ability to understand (cognition). Lifestyle, including: Alcohol, nicotine or tobacco, and drug use. Access to firearms. Diet, exercise, and sleep habits. Work and work Statistician. Sunscreen use. Safety issues such as seatbelt and bike helmet use. Physical exam Your health care provider will check your: Height and weight. These may be used to calculate your BMI (body mass index). BMI is a measurement that tells if you are at a healthy weight. Waist circumference. This measures the distance around your waistline. This measurement also tells if you are at a healthy weight and may help predict your risk of certain diseases, such as type 2 diabetes and high blood pressure. Heart rate and blood pressure. Body temperature. Skin for abnormal spots. What immunizations do I need?  Vaccines are usually given at various ages, according to a schedule. Your health care provider will recommend vaccines for you based on your age, medical history, and lifestyle or other  factors, such as travel or where you work. What tests do I need? Screening Your health care provider may recommend screening tests for certain conditions. This may include: Lipid and cholesterol levels. Diabetes screening. This is done by checking your blood sugar (glucose) after you have not eaten for a while (fasting). Hepatitis C test. Hepatitis B test. HIV (human immunodeficiency virus) test. STI (sexually transmitted infection) testing, if you are at risk. Lung cancer screening. Colorectal cancer screening. Prostate cancer screening. Abdominal aortic aneurysm (AAA) screening. You may need this if you are a current or former smoker. Talk with your health care provider about your test results, treatment options, and if necessary, the need for more tests. Follow these instructions at home: Eating and drinking  Eat a diet that includes fresh fruits and vegetables, whole grains, lean protein, and low-fat dairy products. Limit your intake of foods with high amounts of sugar, saturated fats, and salt. Take vitamin and mineral supplements as recommended by your health care provider. Do not drink alcohol if your health care provider tells you not to drink. If you drink alcohol: Limit how much you have to 0-2 drinks a day. Know how much alcohol is in your drink. In the [place] one drink equals one 12 oz bottle of beer (355 mL), one 5 oz glass of wine (148 mL), or one 1 oz glass of hard liquor (44 mL). Lifestyle Brush your teeth every morning and night with fluoride toothpaste. Floss one time each day. Exercise for at least 30 minutes 5 or more days each week. Do not use any products that contain nicotine or tobacco. These products include cigarettes, chewing tobacco, and vaping devices, such as e-cigarettes.  If you need help quitting, ask your health care provider. Do not use drugs. If you are sexually active, practice safe sex. Use a condom or other form of protection to prevent STIs. Take  aspirin only as told by your health care provider. Make sure that you understand how much to take and what form to take. Work with your health care provider to find out whether it is safe and beneficial for you to take aspirin daily. Ask your health care provider if you need to take a cholesterol-lowering medicine (statin). Find healthy ways to manage stress, such as: Meditation, yoga, or listening to music. Journaling. Talking to a trusted person. Spending time with friends and family. Safety Always wear your seat belt while driving or riding in a vehicle. Do not drive: If you have been drinking alcohol. Do not ride with someone who has been drinking. When you are tired or distracted. While texting. If you have been using any mind-altering substances or drugs. Wear a helmet and other protective equipment during sports activities. If you have firearms in your house, make sure you follow all gun safety procedures. Minimize exposure to UV radiation to reduce your risk of skin cancer. What's next? Visit your health care provider once a year for an annual wellness visit. Ask your health care provider how often you should have your eyes and teeth checked. Stay up to date on all vaccines. This information is not intended to replace advice given to you by your health care provider. Make sure you discuss any questions you have with your health care provider. Document Revised: 07/09/2020 Document Reviewed: 07/09/2020 Elsevier Patient Education  [place].

## 2022-01-12 NOTE — Assessment & Plan Note (Signed)
Down to 4-5 beers overall. Discussed to reduce alcohol consumption continuously.  Discussed his recent GERD symptoms with vomiting and the relationship with alcohol.  Continue to monitor.

## 2022-01-12 NOTE — Assessment & Plan Note (Signed)
LDL goal of <70. Repeat lipid panel pending.  Continue aspirin 81 mg daily, atorvastatin 20 mg daily.

## 2022-01-12 NOTE — Assessment & Plan Note (Signed)
Recent Inspire surgery, no complications. Following with ENT, office notes reviewed through [place].

## 2022-01-12 NOTE — Assessment & Plan Note (Signed)
Controlled.  Stop Symbicort as it has become cost prohibitive. Will switch to Breztri 160-9-4.8 mcg, 2 puffs BID as we can get this free through the patient assistance program.  We discussed this today.

## 2022-01-12 NOTE — Assessment & Plan Note (Signed)
Controlled.  Continue tadalafil 5 mg daily.

## 2022-01-12 NOTE — Assessment & Plan Note (Signed)
Rate and rhythm regular.  Pacemaker in place, reviewed remote pacer notes from November 2023.

## 2022-01-12 NOTE — Progress Notes (Signed)
Subjective:    Patient ID: Brandon Lopez, male    DOB: 03/17/1955, 66 y.o.   MRN: [id number]  HPI  Brandon Lopez is a very pleasant 66 y.o. male with a history of COPD, hypertension, OSA, pacemaker, aortic atherosclerosis, paroxysmal atrial fibrillation who presents today for complete physical and follow up of chronic conditions.  He would also like to mention increased depression symptoms. Historically depression symptoms increase from December, January, February as his working duties slow down for the season and his family doesn't tend to contact him. He is drinking 4-5 beers daily. He is compliant to his Wellbutrin SR 100 mg BID. He's failed numerous treatment regimens in the past, was once seeing psychiatry, no recent follow up. He is not interested in therapy.   Over the last 3-4 nights he's developed "throw up spells". His spell begins with esophageal burning and belching while sitting in the evening. He does experience these symptoms in the morning some, but they are worse than night. He drinks 4-5 beers daily. Recently he switched over to wine. He took some Pepcid last night which helped. He denies vomiting blood.    Immunizations: -Tetanus: 2023 -Influenza: Due today -Shingles: Completed Zostavax -Pneumonia: Prevnar 20 in 2022, Pneumovax 23 in 2016  Diet: Poyen.  Exercise: No regular exercise.  Eye exam: Completes annually  Dental exam: Completes semi-annually   Colonoscopy: Completed in 2016, due 2026 Lung Cancer Screening: Completed in October 2023 PSA: Due   BP Readings from Last 3 Encounters:  01/12/22 136/84  01/05/22 117/84  12/11/21 (!) 148/90        Review of Systems  Constitutional:  Negative for unexpected weight change.  HENT:  Negative for rhinorrhea.   Respiratory:  Negative for cough and shortness of breath.   Cardiovascular:  Negative for chest pain.  Gastrointestinal:  Positive for vomiting. Negative for constipation and  diarrhea.       GERD  Genitourinary:  Negative for difficulty urinating.  Musculoskeletal:  Negative for arthralgias and myalgias.  Skin:  Negative for rash.  Allergic/Immunologic: Negative for environmental allergies.  Neurological:  Negative for dizziness and headaches.  Psychiatric/Behavioral:         See HPI         Past Medical History:  Diagnosis Date   Actinic keratosis    Arthritis    Chronic systolic CHF (congestive heart failure) ([place])    a. echo 6/16: EF 55-60%, Gr1DD, LA nl, PASP nl; b. echo 7/19: EF 40-45%, unable to exclude RWMA, Gr1DD, LA nl, RVSF nl, PASP nl   COPD (chronic obstructive pulmonary disease) ([place])    Depression    Diverticulosis    Found during colonoscopy   Edema 07/21/2016   History of kidney stones    Hypertension    Left facial swelling 12/11/2021   Left lower quadrant abdominal pain 10/27/2021   Lightheadedness 04/21/2021   PAF (paroxysmal atrial fibrillation) ([place])    a. short episodes previously noted on PPM interrogation; b. started on Xarelto 07/2017 given further episodes of Afib noted during interrogation; c. 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 Biotronik; Dayna Barker DR-T [id number]; placed 08/2012   Shortness of breath 04/21/2021   Sleep apnea    does not use CPAP   Symptomatic bradycardia    a. s/p Biotronik; Dayna Barker DR-T [id number]; placed 08/2012 at [facility]  Syncope 08/10/2017    Social History   Socioeconomic History   Marital status: Single    Spouse name: Not on file   Number of children: Not on file   Years of education: Not on file   Highest education level: Not on file  Occupational History   Not on file  Tobacco Use   Smoking status: Former    Packs/day: 2.00    Years: 50.00    Total pack years: 100.00    Types: Cigarettes    Start date: 1964    Quit date: 02/01/2021    Years since quitting: 0.9    Smokeless tobacco: Never   Tobacco comments:    started smoking at age 64, 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  Vaping Use   Vaping Use: Never used  Substance and Sexual Activity   Alcohol use: Yes    Alcohol/week: 8.0 - 14.0 standard drinks of alcohol    Types: 8 - 14 Cans of beer per week    Comment: daily beer   Drug use: Yes    Types: Marijuana    Comment: states smokes daily, last 11-09-21   Sexual activity: Yes  Other Topics Concern   Not on file  Social History Narrative   Not on file   Social Determinants of Health   Financial Resource Strain: Low Risk  (07/15/2021)   Overall Financial Resource Strain (CARDIA)    Difficulty of Paying Living Expenses: Not hard at all  Food Insecurity: No Food Insecurity (07/15/2021)   Hunger Vital Sign    Worried About Running Out of Food in the Last Year: Never true    Ran Out of Food in the Last Year: Never true  Transportation Needs: No Transportation Needs (07/15/2021)   PRAPARE - Hydrologist (Medical): No    Lack of Transportation (Non-Medical): No  Physical Activity: Inactive (07/15/2021)   Exercise Vital Sign    Days of Exercise per Week: 0 days    Minutes of Exercise per Session: 0 min  Stress: No Stress Concern Present (07/15/2021)   [place]    Feeling of Stress : Only a little  Social Connections: Socially Isolated (07/15/2021)   Social Connection and Isolation Panel [NHANES]    Frequency of Communication with Friends and Family: More than three times a week    Frequency of Social Gatherings with Friends and Family: Three times a week    Attends Religious Services: Never    Active Member of Clubs or Organizations: No    Attends Archivist Meetings: Never    Marital Status: Divorced  Human resources officer Violence: Not At Risk (07/15/2021)   Humiliation, Afraid, Rape, and Kick questionnaire    Fear of Current or Ex-Partner: No     Emotionally Abused: No    Physically Abused: No    Sexually Abused: No    Past Surgical History:  Procedure Laterality Date   APPENDECTOMY  age 60   CARDIAC CATHETERIZATION     7 years ago   COLONOSCOPY N/A 05/27/2014   Procedure: COLONOSCOPY;  Surgeon: Hulen Luster, MD;  Location[facility] ENDOSCOPY;  Service: Gastroenterology;  Laterality: N/A;   DRUG INDUCED ENDOSCOPY Bilateral 11/11/2021   Procedure: DRUG INDUCED ENDOSCOPY;  Surgeon: Melida Quitter, MD;  Location: [place];  Service: ENT;  Laterality: Bilateral;   FLOOR OF MOUTH BIOPSY N/A 01/05/2022   Procedure: BIOPSY OF PALATE;  Surgeon: Melida Quitter, MD;  Location:  [place];  Service: ENT;  Laterality: N/A;   IMPLANTATION OF HYPOGLOSSAL NERVE STIMULATOR Left 01/05/2022   Procedure: IMPLANTATION OF HYPOGLOSSAL NERVE STIMULATOR;  Surgeon: Melida Quitter, MD;  Location: [place];  Service: ENT;  Laterality: Left;   INSERT / REPLACE / REMOVE PACEMAKER     3 years ago    Family History  Problem Relation Age of Onset   Arthritis Mother    Heart disease Father    Hyperlipidemia Father    Hypertension Father    Cancer Paternal Grandfather     Allergies  Allergen Reactions   Simvastatin Diarrhea   Lisinopril Cough    Current Outpatient Medications on File Prior to Visit  Medication Sig Dispense Refill   aspirin EC 81 MG tablet Take 81 mg by mouth daily. Swallow whole.     atorvastatin (LIPITOR) 20 MG tablet TAKE 1 TABLET BY MOUTH ONCE DAILY FOR CHOLESTEROL 90 tablet 0   Budeson-Glycopyrrol-Formoterol (BREZTRI AEROSPHERE) 160-9-4.8 MCG/ACT AERO Inhale 2 puffs into the lungs 2 (two) times daily. 10.7 g 11   carvedilol (COREG) 3.125 MG tablet Take 1 tablet by mouth twice daily 180 tablet 0   ezetimibe (ZETIA) 10 MG tablet Take 1 tablet by mouth once daily 90 tablet 2   tadalafil (CIALIS) 5 MG tablet Take 1 tablet (5 mg total) by mouth daily. For frequent urination 90 tablet 0    telmisartan (MICARDIS) 20 MG tablet Take 1 tablet by mouth once daily for blood pressure 90 tablet 0   folic acid (FOLVITE) 1 MG tablet Take 1 tablet by mouth once daily (Patient not taking: Reported on 01/12/2022) 90 tablet 0   No current facility-administered medications on file prior to visit.    BP 136/84   Pulse 78   Temp (!) 97.2 F (36.2 C) (Temporal)   Ht '5\' 5"'$  (1.651 m)   Wt 199 lb (90.3 kg)   SpO2 98%   BMI 33.12 kg/m  Objective:   Physical Exam HENT:     Right Ear: Tympanic membrane and ear canal normal.     Left Ear: Tympanic membrane and ear canal normal.     Nose: Nose normal.     Right Sinus: No maxillary sinus tenderness or frontal sinus tenderness.     Left Sinus: No maxillary sinus tenderness or frontal sinus tenderness.  Eyes:     Conjunctiva/sclera: Conjunctivae normal.  Neck:     Thyroid: No thyromegaly.     Vascular: No carotid bruit.  Cardiovascular:     Rate and Rhythm: Normal rate and regular rhythm.     Heart sounds: Normal heart sounds.  Pulmonary:     Effort: Pulmonary effort is normal.     Breath sounds: Normal breath sounds. No wheezing or rales.  Abdominal:     General: Bowel sounds are normal.     Palpations: Abdomen is soft.     Tenderness: There is no abdominal tenderness.  Musculoskeletal:        General: Normal range of motion.     Cervical back: Neck supple.  Skin:    General: Skin is warm and dry.  Neurological:     Mental Status: He is alert and oriented to person, place, and time.     Cranial Nerves: No cranial nerve deficit.     Deep Tendon Reflexes: Reflexes are normal and symmetric.  Psychiatric:        Mood and Affect: Mood normal.  Assessment & Plan:   Problem List Items Addressed This Visit       Cardiovascular and Mediastinum   Essential hypertension    Borderline high today. Recommended he start monitoring at home.  Continue telmisartan 20 mg daily and carvedilol 3.25 mg BID for now. Consider  dose adjustment if warranted.       Relevant Orders   Hemoglobin A1c   Paroxysmal atrial fibrillation (HCC)    Rate and rhythm regular.  Pacemaker in place, reviewed remote pacer notes from November 2023.      Aortic atherosclerosis (HCC)    LDL goal of <70. Repeat lipid panel pending.  Continue aspirin 81 mg daily, atorvastatin 20 mg daily.         Respiratory   OSA (obstructive sleep apnea)    Recent Inspire surgery, no complications. Following with ENT, office notes reviewed through [place].         COPD (chronic obstructive pulmonary disease) ([place])    Controlled.  Stop Symbicort as it has become cost prohibitive. Will switch to Breztri 160-9-4.8 mcg, 2 puffs BID as we can get this free through the patient assistance program.  We discussed this today.      Relevant Medications   albuterol (VENTOLIN HFA) 108 (90 Base) MCG/ACT inhaler     Digestive   GERD (gastroesophageal reflux disease)    Suspect recent vomiting episodes are secondary to GERD, especially given improvement with Pepcid. Discussed triggers of GERD which includes alcohol consumption, acidic/spicy foods, laying flat within 2 hours after eating.  Recommended he cut back on alcohol use.  Start famotidine 20 mg at bedtime for now. He will update if no improvement.        Other   Pacemaker    Reviewed remote pacer check from November 2023.      Anxiety and depression    Deteriorated.  Historically waxes and wanes.  He has failed multiple treatment regimens in the past. Recommended psychiatry and therapy evaluation, he currently declines but will think about this.  Increase bupropion to 150 mg twice daily. Strongly discouraged alcohol consumption with antidepressant treatment.  Discussed that this can provoke symptoms of depression.  He will update.      Relevant Medications   buPROPion (WELLBUTRIN SR) 150 MG 12 hr tablet   Alcohol abuse    Down to 4-5 beers overall. Discussed to  reduce alcohol consumption continuously.  Discussed his recent GERD symptoms with vomiting and the relationship with alcohol.  Continue to monitor.      Hyperlipidemia    Continue atorvastatin 20 mg daily. Repeat lipid panel pending.      Relevant Orders   Lipid panel   Encounter for annual general medical examination with abnormal findings in adult - Primary    Immunizations UTD. Influenza vaccine provided today.  Discussed to obtain Shingrix vaccine to the pharmacy. PSA due and pending. Colonoscopy up-to-date, due 2026. Lung cancer screening up-to-date.  Discussed the importance of a healthy diet and regular exercise in order for weight loss, and to reduce the risk of further co-morbidity.  Exam stable. Labs pending.  Follow up in 1 year for repeat physical.       Erectile dysfunction    Controlled.  Continue tadalafil 5 mg daily.      Weak urinary stream    Controlled.  Continue tadalafil 5 mg daily.      Other Visit Diagnoses     Screening for prostate cancer  Relevant Orders   PSA, Medicare          Pleas Koch, NP

## 2022-01-12 NOTE — Assessment & Plan Note (Signed)
Deteriorated.  Historically waxes and wanes.  He has failed multiple treatment regimens in the past. Recommended psychiatry and therapy evaluation, he currently declines but will think about this.  Increase bupropion to 150 mg twice daily. Strongly discouraged alcohol consumption with antidepressant treatment.  Discussed that this can provoke symptoms of depression.  He will update.

## 2022-01-12 NOTE — Assessment & Plan Note (Signed)
Borderline high today. Recommended he start monitoring at home.  Continue telmisartan 20 mg daily and carvedilol 3.25 mg BID for now. Consider dose adjustment if warranted.

## 2022-02-01 DIAGNOSIS — J441 Chronic obstructive pulmonary disease with (acute) exacerbation: Secondary | ICD-10-CM | POA: Diagnosis not present

## 2022-02-01 NOTE — Telephone Encounter (Signed)
See other MyChart message for further documentation

## 2022-02-02 ENCOUNTER — Other Ambulatory Visit: Payer: Self-pay | Admitting: Primary Care

## 2022-02-02 DIAGNOSIS — I10 Essential (primary) hypertension: Secondary | ICD-10-CM

## 2022-02-06 NOTE — Progress Notes (Unsigned)
05/29/21- 67 yoM former smoker (100 pkyrs, QS Jan2023)for sleep evaluation with concern of OSA and exacerbation of dyspnea. Medical problems include CHF/Pacemakert,, HTN, PAFib, COPD, HxTobacco Use, OSA, Diverticulosis, Osteoarthritis, ETOH, Anxiety/Depression, Hx Positive TB PPD,  NPSG [place] 09/10/15- AHI 44.3/ hr, desaturation to 83%, body weight 184 lbs, CPAP to 10 CPAP [employer]- not using Epworth score 2 Body weight today-189 lbs Arrival O2 sat 95% room air -Albuterol hfa, Symbicort 160,  Screening Chest CT program CXR 04/21/21-IMPRESSION: No active cardiopulmonary disease. FINDINGS: Cardiomediastinal silhouette unchanged in size and contour. No evidence of central vascular congestion. No interlobular septal thickening.  Low lung volumes persist. Coarsened interstitial markings.  No new confluent airspace disease. Right chest wall pacing device unchanged. Coarsened interstitial markings, with no new confluent airspace disease. No acute displaced fracture. Degenerative changes of the spine. 1) OSA-after sleep study in 2017 he used CPAP for 6 months then quit because he felt more tired and less well rested after using CPAP.  He is specifically curious now about Inspire.  He is aware of daytime tiredness and implies he has been told of loud snoring.  ENT for tonsillectomy as child.  We discussed his original sleep study and medical implications.  Discussed treatment options and are making referral to ENT to discuss Inspire option.  He may be asked to update sleep study and to retry CPAP first. 2) COPD-acute exacerbation in the last 5 days, started with diffuse myalgias and malaise but no recognized fever, chills or purulent sputum.  Increased dyspnea on exertion without cough.  After arrival here he asked to go out to his truck to get his rescue inhaler which gave significant relief.  He had COVID infection once a year or so ago and tested negative for COVID infection earlier this week.  Visited  girlfriend recently and she is now sick with similar illness. 3) asbestos exposure-his father worked removing asbestos when he was a little boy.  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  He is enrolled in the low-dose chest CT screening program.  02/09/22- 66 yoM former smoker followed for OSA, COPD (100 pkyrs, QS [id number]), Asbestos exposure, complicated by CHF/Pacemakert,, HTN, PAFib, COPD, HxTobacco Use, OSA, Diverticulosis, Osteoarthritis, ETOH, Anxiety/Depression, Hx Positive TB PPD,  -Rebekah [place],  Inspire Hypoglossal Nerve Stimulator 01/01/22-Dr Redmond Baseman Body weight today- Covid vac- Flu vax-    CT chest low dose screen 06/17/21- IMPRESSION: Lung-RADS 1, negative. Continue annual screening with low-dose chest CT without contrast in 12 months.   Aortic Atherosclerosis (ICD10-I70.0) and Emphysema (ICD10-J43.9).  ROS-see HPI   + = positive Constitutional:    weight loss, night sweats, fevers, chills, fatigue, lassitude. HEENT:    headaches, difficulty swallowing, tooth/dental problems, sore throat,       sneezing, itching, ear ache, nasal congestion, post nasal drip, snoring CV:    chest pain, orthopnea, PND, swelling in lower extremities, anasarca,                                   dizziness, palpitations Resp:   +shortness of breath with exertion or at rest.                productive cough,   non-productive cough, coughing up of blood.              change in color of mucus.  wheezing.   Skin:    rash or lesions. GI:  +heartburn,  indigestion, abdominal pain, nausea, vomiting, diarrhea,                 change in bowel habits, +loss of appetite GU: dysuria, change in color of urine, no urgency or frequency.   flank pain. MS:   joint pain, stiffness, decreased range of motion, back pain. Neuro-     nothing unusual Psych:  change in mood or affect.  depression or anxiety.   memory loss.  OBJ- Physical Exam   +overweight General- Alert, Oriented, Affect-appropriate,  Distress- +moderate, relieved by use of rescue inhaler Skin- rash-none, lesions- none, excoriation- none Lymphadenopathy- none Head- atraumatic            Eyes- Gross vision intact, PERRLA, conjunctivae and secretions clear            Ears- Hearing, canals-normal            Nose- Clear, no-Septal dev, mucus, polyps, erosion, perforation             Throat- Mallampati II , mucosa clear , drainage- none, tonsils- atrophic Neck- flexible , trachea midline, no stridor , thyroid nl, carotid no bruit Chest - symmetrical excursion , unlabored           Heart/CV- RRR , no murmur , no gallop  , no rub, nl s1 s2                           - JVD- none , edema- none, stasis changes- none, varices- none           Lung- +breathless, + few rhonchi R base, no distinct crackle,  wheeze- none, cough , dullness-none, rub- none           Chest wall-  Abd-  Br/ Gen/ Rectal- Not done, not indicated Extrem- cyanosis- none, clubbing, none, atrophy- none, strength- nl Neuro- grossly intact to observatio

## 2022-02-09 ENCOUNTER — Ambulatory Visit: Payer: PPO | Admitting: Internal Medicine

## 2022-02-09 ENCOUNTER — Encounter: Payer: Self-pay | Admitting: Internal Medicine

## 2022-02-09 VITALS — BP 128/84 | HR 101 | Ht 65.0 in | Wt 201.6 lb

## 2022-02-09 DIAGNOSIS — G4733 Obstructive sleep apnea (adult) (pediatric): Secondary | ICD-10-CM

## 2022-02-09 DIAGNOSIS — F17200 Nicotine dependence, unspecified, uncomplicated: Secondary | ICD-10-CM | POA: Diagnosis not present

## 2022-02-09 DIAGNOSIS — J449 Chronic obstructive pulmonary disease, unspecified: Secondary | ICD-10-CM

## 2022-02-09 NOTE — Assessment & Plan Note (Signed)
Now at baseline. Satisfied that Brandon Lopez is helpful.

## 2022-02-09 NOTE — Assessment & Plan Note (Signed)
Reports smoking cessation January, 2023.

## 2022-02-09 NOTE — Assessment & Plan Note (Signed)
He was assisted with app on phone and adjustment of control by [facility] rep. Plan- advance voltage level as tolerated.

## 2022-02-09 NOTE — Patient Instructions (Signed)
Advance settings 1 level/ week on Tuesdays as instructed.  We will see you in a month

## 2022-02-10 ENCOUNTER — Encounter: Payer: Self-pay | Admitting: Internal Medicine

## 2022-02-12 ENCOUNTER — Other Ambulatory Visit: Payer: Self-pay | Admitting: Primary Care

## 2022-02-12 DIAGNOSIS — N401 Enlarged prostate with lower urinary tract symptoms: Secondary | ICD-10-CM

## 2022-02-25 ENCOUNTER — Other Ambulatory Visit: Payer: Self-pay | Admitting: Cardiovascular Disease

## 2022-03-04 DIAGNOSIS — J441 Chronic obstructive pulmonary disease with (acute) exacerbation: Secondary | ICD-10-CM | POA: Diagnosis not present

## 2022-03-09 ENCOUNTER — Encounter: Payer: Self-pay | Admitting: Internal Medicine

## 2022-03-09 ENCOUNTER — Ambulatory Visit: Payer: PPO

## 2022-03-09 ENCOUNTER — Ambulatory Visit (INDEPENDENT_AMBULATORY_CARE_PROVIDER_SITE_OTHER): Payer: PPO | Admitting: Internal Medicine

## 2022-03-09 VITALS — BP 116/70 | HR 77 | Ht 65.0 in | Wt 197.4 lb

## 2022-03-09 DIAGNOSIS — I495 Sick sinus syndrome: Secondary | ICD-10-CM

## 2022-03-09 DIAGNOSIS — J449 Chronic obstructive pulmonary disease, unspecified: Secondary | ICD-10-CM

## 2022-03-09 DIAGNOSIS — G4733 Obstructive sleep apnea (adult) (pediatric): Secondary | ICD-10-CM

## 2022-03-09 LAB — CUP PACEART REMOTE DEVICE CHECK
Date Time Interrogation Session: 20240213074355
Implantable Lead Connection Status: 753985
Implantable Lead Connection Status: 753985
Implantable Lead Implant Date: 20140801
Implantable Lead Implant Date: 20140801
Implantable Lead Location: 753859
Implantable Lead Location: 753860
Implantable Lead Model: 362
Implantable Lead Serial Number: 29356730
Implantable Lead Serial Number: 29392661
Implantable Pulse Generator Implant Date: 20140801
Pulse Gen Serial Number: 68053398

## 2022-03-09 NOTE — Assessment & Plan Note (Signed)
Breathing was clear and unlabored on room air at this visit.  Current medicines are appropriate.

## 2022-03-09 NOTE — Progress Notes (Signed)
05/29/21- 32 yoM former smoker (100 pkyrs, QS Jan2023)for sleep evaluation with concern of OSA and exacerbation of dyspnea. Medical problems include CHF/Pacemakert,, HTN, PAFib, COPD, HxTobacco Use, OSA, Diverticulosis, Osteoarthritis, ETOH, Anxiety/Depression, Hx Positive TB PPD,  NPSG [place] 09/10/15- AHI 44.3/ hr, desaturation to 83%, body weight 184 lbs, CPAP to 10 CPAP [employer]- not using Epworth score 2 Body weight today-189 lbs Arrival O2 sat 95% room air -Albuterol hfa, Symbicort 160,  Screening Chest CT program CXR 04/21/21-IMPRESSION: No active cardiopulmonary disease. FINDINGS: Cardiomediastinal silhouette unchanged in size and contour. No evidence of central vascular congestion. No interlobular septal thickening.  Low lung volumes persist. Coarsened interstitial markings.  No new confluent airspace disease. Right chest wall pacing device unchanged. Coarsened interstitial markings, with no new confluent airspace disease. No acute displaced fracture. Degenerative changes of the spine. 1) OSA-after sleep study in 2017 he used CPAP for 6 months then quit because he felt more tired and less well rested after using CPAP.  He is specifically curious now about Inspire.  He is aware of daytime tiredness and implies he has been told of loud snoring.  ENT for tonsillectomy as child.  We discussed his original sleep study and medical implications.  Discussed treatment options and are making referral to ENT to discuss Inspire option.  He may be asked to update sleep study and to retry CPAP first. 2) COPD-acute exacerbation in the last 5 days, started with diffuse myalgias and malaise but no recognized fever, chills or purulent sputum.  Increased dyspnea on exertion without cough.  After arrival here he asked to go out to his truck to get his rescue inhaler which gave significant relief.  He had COVID infection once a year or so ago and tested negative for COVID infection earlier this week.  Visited  girlfriend recently and she is now sick with similar illness. 3) asbestos exposure-his father worked removing asbestos when he was a little boy.  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  He is enrolled in the low-dose chest CT screening program.  02/09/22- 66 yoM former smoker followed for OSA, COPD (100 pkyrs, QS [id number]), Asbestos exposure, complicated by CHF/Pacemakert,, HTN, PAFib, COPD, HxTobacco Use, OSA, Diverticulosis, Osteoarthritis, ETOH, Anxiety/Depression, Hx Positive TB PPD,  -Rebekah [place],  Inspire Hypoglossal Nerve Stimulator 01/05/22-Dr Redmond Baseman Body weight today-201 lbs Covid vax-none Flu vax-had -----Pt here for inspire activation  COPD doing well. Quit smoking last month. PCP changed Symbicort to [employer] with manufacturer's assistance Inspire surgery has healed well. Minimal residual tenderness.  Inspire activation initiated, planning to build by 1 level/ week as tolerated. CT chest low dose screen 06/17/21- IMPRESSION: Lung-RADS 1, negative. Continue annual screening with low-dose chest CT without contrast in 12 months. Aortic Atherosclerosis (ICD10-I70.0) and Emphysema (ICD10-J43.9).  03/09/22-66 yoM former smoker followed for OSA, COPD (100 pkyrs, QS [id number]), Asbestos exposure, complicated by CHF/Pacemakert,, HTN, PAFib, COPD, HxTobacco Use, OSA, Diverticulosis, Osteoarthritis, ETOH, Anxiety/Depression, Hx Positive TB PPD,  -Rebekah [place],  Inspire Hypoglossal Nerve Stimulator 01/05/22-Dr Wynne Dust Activation 02/09/22 Body weight today-197 lbs Covid vax-none Flu vax-had 1 month post inspire activation. Patient states he is getting good rest [place], [facility] and [place], [place] [facility] .  Tonight he will advance to level 5.  Will return in 6 weeks and we will be scheduling Inspire sleep study. He is comfortable with his breathing with no acute changes and continue his current meds. Frequent nocturia is bothersome and he will work with urology.  ROS-see  HPI   + =  positive Constitutional:    weight loss, night sweats, fevers, chills, fatigue, lassitude. HEENT:    headaches, difficulty swallowing, tooth/dental problems, sore throat,       sneezing, itching, ear ache, nasal congestion, post nasal drip, snoring CV:    chest pain, orthopnea, PND, swelling in lower extremities, anasarca,                                   dizziness, palpitations Resp:   +shortness of breath with exertion or at rest.                productive cough,   non-productive cough, coughing up of blood.              change in color of mucus.  wheezing.   Skin:    rash or lesions. GI:  +heartburn, indigestion, abdominal pain, nausea, vomiting, diarrhea,                 change in bowel habits, +loss of appetite GU: dysuria, change in color of urine, no urgency or frequency.   flank pain. MS:   joint pain, stiffness, decreased range of motion, back pain. Neuro-     nothing unusual Psych:  change in mood or affect.  depression or anxiety.   memory loss.  OBJ- Physical Exam   +overweight General- Alert, Oriented, Affect-appropriate, Distress- none Skin- rash-none, lesions- none, excoriation- none Lymphadenopathy- none Head- atraumatic            Eyes- Gross vision intact, PERRLA, conjunctivae and secretions clear            Ears- Hearing, canals-normal            Nose- Clear, no-Septal dev, mucus, polyps, erosion, perforation             Throat- Mallampati II , mucosa clear , drainage- none, tonsils- atrophic Neck- flexible , trachea midline, no stridor , thyroid nl, carotid no bruit Chest - symmetrical excursion , unlabored           Heart/CV- RRR , no murmur , no gallop  , no rub, nl s1 s2                           - JVD- none , edema- none, stasis changes- none, varices- none           Lung- + clear/ no rhonchi, no distinct crackle,  wheeze- none, cough , dullness-none, rub- none           Chest wall- +pacemaker on right. + Inspire on left Abd-  Br/ Gen/ Rectal- Not  done, not indicated Extrem- cyanosis- none, clubbing, none, atrophy- none, strength- nl Neuro- grossly intact to observatio

## 2022-03-09 NOTE — Assessment & Plan Note (Signed)
He is progressing with Inspire activation protocol.  No problems. Plan-advance now to level 5.  Anticipating return in 6 weeks, Inspire sleep study

## 2022-03-09 NOTE — Patient Instructions (Signed)
You are doing well. Inspire goes up to level 5 tonight.  Order- Inspire titration sleep study

## 2022-03-18 ENCOUNTER — Other Ambulatory Visit: Payer: Self-pay | Admitting: Primary Care

## 2022-03-18 DIAGNOSIS — E785 Hyperlipidemia, unspecified: Secondary | ICD-10-CM

## 2022-03-26 ENCOUNTER — Telehealth: Payer: Self-pay

## 2022-03-26 NOTE — Progress Notes (Signed)
Care Management & Coordination Services Pharmacy Team  Reason for Encounter: Appointment Reminder  Contacted patient to confirm telephone appointment with Charlene Brooke , PharmD on 03/30/32 at 3:15. Unsuccessful outreach. Left voicemail for patient to return call.   Star Rating Drugs:  Medication:  Last Fill: Day Supply Atorvastatin '20mg'$  03/18/22 90 Telmisartan '20mg'$  02/02/22  90  Care Gaps: Annual wellness visit in last year? Yes  Charlene Brooke, PharmD notified  [name], [place] Assistant [phone]

## 2022-03-31 ENCOUNTER — Telehealth: Payer: Self-pay | Admitting: Pharmacist

## 2022-03-31 ENCOUNTER — Ambulatory Visit: Payer: PPO | Admitting: Pharmacist

## 2022-03-31 NOTE — Progress Notes (Signed)
Care Management & Coordination Services Pharmacy Note  03/31/2022 Name:  Brandon Lopez MRN:  [id number] DOB:  [birth year]  Summary: -HTN: BP at goal; pt also recently had Inspire device, he reports improved sleep quality -COPD: discussed inhaler usage; he is compliant with Judithann Sauger and has not needed albuterol/Duoneb for rescue lately -Depression: he report bupropion dose increase has helped, he feels mood is stable overall   Recommendations/Changes made from today's visit: -No med changes  Follow up plan: -Health Concierge will call patient 3 months for COPD update -Pharmacist follow up PRN -Pulmonary appts 04/20/22, 05/04/22    Subjective: Brandon Lopez is an 67 y.o. year old male who is a primary patient of Pleas Koch, NP.  The care coordination team was consulted for assistance with disease management and care coordination needs.    Engaged with patient by telephone for follow up visit.  Recent office visits: 01/12/22 NP Allie Bossier OV: annual - update bupropion to BID. D/C meloxicam, loratadine, hydrocodone (not taking).  07/23/21 NP Allie Bossier OV: f/u depression. Start bupropion SR 100 mg, then BID. Stop 5HTP supplement.   Recent consult visits: 03/09/22 Dr Annamaria Boots (Pulmonology): OSA, COPD. Has inspire device. No med changes. 02/09/22 Dr Annamaria Boots [facility]): f/u OSA, inspire device.  01/13/22 Dr Redmond Baseman (ENT): OSA - f/u inspire procedure  08/05/21 Dr Erlene Quan (Urology): weak urinary stream. Likely fluid issue rather than prostate issue and untreated OSA. Advised to reduce beer, stop fluids 4 hrs before bedtime. Address OSA.   05/29/21 Dr Annamaria Boots (Pulmonary): COPD, OSA. Refer to ENT for Inspire option. Acute COPD exacerbation, likely viral. Ordered home nebulizer/Duoneb.  Hospital visits: None in previous 6 months   Objective:  Lab Results  Component Value Date   CREATININE 0.70 10/27/2021   BUN 11 10/27/2021   GFR 93.91 10/27/2021   GFRNONAA >60 07/13/2021    GFRAA >60 08/09/2017   NA 135 10/27/2021   K 4.1 10/27/2021   CALCIUM 9.3 10/27/2021   CO2 24 10/27/2021   GLUCOSE 100 (H) 10/27/2021    Lab Results  Component Value Date/Time   HGBA1C 5.6 01/12/2022 11:04 AM   HGBA1C 5.8 04/21/2021 11:33 AM   GFR 93.91 10/27/2021 11:08 AM   GFR 80.77 06/17/2021 10:55 AM    Last diabetic Eye exam: No results found for: "HMDIABEYEEXA"  Last diabetic Foot exam: No results found for: "HMDIABFOOTEX"   Lab Results  Component Value Date   CHOL 153 01/12/2022   HDL 41.10 01/12/2022   LDLCALC 72 12/11/2020   LDLDIRECT 84.0 01/12/2022   TRIG 259.0 (H) 01/12/2022   CHOLHDL 4 01/12/2022       Latest Ref Rng & Units 10/27/2021   11:08 AM 07/13/2021    7:05 PM 04/21/2021   11:33 AM  Hepatic Function  Total Protein 6.0 - 8.3 g/dL 6.9  7.1  7.0   Albumin 3.5 - 5.2 g/dL 4.3  3.9  4.4   AST 0 - 37 U/L '19  15  17   '$ ALT 0 - 53 U/L '25  19  24   '$ Alk Phosphatase 39 - 117 U/L 69  53  73   Total Bilirubin 0.2 - 1.2 mg/dL 0.9  1.2  0.7     Lab Results  Component Value Date/Time   TSH 2.67 06/17/2021 10:55 AM   TSH 3.19 04/21/2021 11:33 AM       Latest Ref Rng & Units 10/27/2021   11:08 AM 07/13/2021    7:05 PM 06/17/2021   10:55  AM  CBC  WBC 4.0 - 10.5 K/uL 13.4  11.1  6.7   Hemoglobin 13.0 - 17.0 g/dL 15.6  15.0  14.7   Hematocrit 39.0 - 52.0 % 46.2  43.8  43.2   Platelets 150.0 - 400.0 K/uL 191.0  238  246.0     Lab Results  Component Value Date/Time   VITAMINB12 148 (L) 06/17/2021 10:55 AM    Clinical ASCVD: Yes  The 10-year ASCVD risk score (Arnett DK, et al., 2019) is: 16.9%   Values used to calculate the score:     Age: 14 years     Sex: Male     Is Non-Hispanic African American: No     Diabetic: No     Tobacco smoker: Yes     Systolic Blood Pressure: 99991111 mmHg     Is BP treated: Yes     HDL Cholesterol: 41.1 mg/dL     Total Cholesterol: 153 mg/dL       01/12/2022   10:18 AM 07/15/2021    8:12 AM 07/14/2020    8:14 AM   Depression screen PHQ 2/9  Decreased Interest 1 0 0  Down, Depressed, Hopeless 1 0 0  PHQ - 2 Score 2 0 0  Altered sleeping 1 1 0  Tired, decreased energy 1 1 0  Change in appetite 1 1 0  Feeling bad or failure about yourself  0 0 0  Trouble concentrating 0 0 0  Moving slowly or fidgety/restless 0 0 0  Suicidal thoughts 0 0 0  PHQ-9 Score 5 3 0  Difficult doing work/chores Not difficult at all Not difficult at all Not difficult at all     Social History   Tobacco Use  Smoking Status Former   Packs/day: 2.00   Years: 50.00   Total pack years: 100.00   Types: Cigarettes   Start date: 1964   Quit date: 02/01/2021   Years since quitting: 1.1  Smokeless Tobacco Never  Tobacco Comments   started smoking at age 60, 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   BP Readings from Last 3 Encounters:  03/09/22 116/70  02/09/22 128/84  01/12/22 136/84   Pulse Readings from Last 3 Encounters:  03/09/22 77  02/09/22 (!) 101  01/12/22 78   Wt Readings from Last 3 Encounters:  03/09/22 197 lb 6.4 oz (89.5 kg)  02/09/22 201 lb 9.6 oz (91.4 kg)  01/12/22 199 lb (90.3 kg)   BMI Readings from Last 3 Encounters:  03/09/22 32.85 kg/m  02/09/22 33.55 kg/m  01/12/22 33.12 kg/m    Allergies  Allergen Reactions   Simvastatin Diarrhea   Lisinopril Cough    Medications Reviewed Today     Reviewed by Charlton Haws, RPH (Pharmacist) on 03/31/22 at 38  Med List Status: <None>   Medication Order Taking? Sig Documenting Provider Last Dose Status Informant  albuterol (VENTOLIN HFA) 108 (90 Base) MCG/ACT inhaler [id number] Yes INHALE 2 PUFFS BY MOUTH EVERY 6 HOURS AS NEEDED FOR WHEEZING AND FOR SHORTNESS OF Nicholes Stairs, NP Taking Active   aspirin EC 81 MG tablet [id number] Yes Take 81 mg by mouth daily. Swallow whole. [provider] Taking Active   atorvastatin (LIPITOR) 20 MG tablet [id number] Yes TAKE 1 TABLET BY MOUTH ONCE DAILY FOR CHOLESTEROL Pleas Koch, NP  Taking Active   Budeson-Glycopyrrol-Formoterol (BREZTRI AEROSPHERE) 160-9-4.8 MCG/ACT AERO [id number] Yes Inhale 2 puffs into the lungs 2 (two) times daily. Pleas Koch, NP Taking Active   buPROPion [facility]  SR) 150 MG 12 hr tablet [id number] Yes Take 1 tablet (150 mg total) by mouth 2 (two) times daily. For depression Pleas Koch, NP Taking Active   carvedilol (COREG) 3.125 MG tablet [id number] Yes Take 1 tablet by mouth twice daily Deboraha Sprang, MD Taking Active   ezetimibe (ZETIA) 10 MG tablet [id number] Yes Take 1 tablet (10 mg total) by mouth daily. Please call [phone] to schedule yearly follow up visit. Minna Merritts, MD Taking Active   folic acid (FOLVITE) 1 MG tablet [id number] Yes Take 1 tablet by mouth once daily Pleas Koch, NP Taking Active   tadalafil (CIALIS) 5 MG tablet [id number] Yes TAKE 1 TABLET BY MOUTH ONCE DAILY FOR  FREQUENT  Little Ishikawa, NP Taking Active   telmisartan (MICARDIS) 20 MG tablet [id number] Yes Take 1 tablet by mouth once daily for blood pressure Carlis Abbott Leticia Penna, NP Taking Active             SDOH:  (Social Determinants of Health) assessments and interventions performed: No SDOH Interventions    Flowsheet Row Clinical Support from 07/15/2021 in [place] at [place] Management from 01/27/2021 in [place] at [place] from 07/14/2020 in [place] at [place] from 12/11/2018 in [place] at [place] Interventions Intervention Not Indicated -- -- --  Housing Interventions Intervention Not Indicated -- -- --  Transportation Interventions Intervention Not Indicated -- -- --  Depression Interventions/Treatment  -- -- [id number] Score <4 Follow-up Not Indicated PHQ2-9 Score <4 Follow-up Not Indicated  Financial Strain Interventions  Intervention Not Indicated Intervention Not Indicated -- --  Physical Activity Interventions Patient Refused -- -- --  Stress Interventions Intervention Not Indicated -- -- --  Social Connections Interventions Intervention Not Indicated -- -- --       Medication Assistance:  Breztri - [place] PAP approved 2024  Medication Access: Within the past 30 days, how often has patient missed a dose of medication? 0 Is a pillbox or other method used to improve adherence? Yes  Factors that may affect medication adherence? no barriers identified Are meds synced by current pharmacy? No  Are meds delivered by current pharmacy? No  Does patient experience delays in picking up medications due to transportation concerns? No   Upstream Services Reviewed: Is patient disadvantaged to use [facility] Pharmacy?: Yes  Current Rx insurance plan: HTA Name and location of Current pharmacy:  [facility], [address] [place] [address] [place] [address] Phone: [phone] Fax: [place], [place]. [place] [address] Phone: [phone] Fax: [phone]  [facility] Pharmacy services reviewed with patient today?: No  Patient requests to transfer care to Up[facility] Pharmacy?: No  Reason patient declined to change pharmacies: Disadvantaged due to insurance/mail order  Compliance/Adherence/Medication fill history: Care Gaps: None  Star-Rating Drugs: Atorvastatin - PDC 100% Telmisartan - PDC 100%   ASSESSMENT / PLAN  Hypertension (BP goal <140/90) -Controlled - BP at goal at home, not too low after switching to telmisartan (plain) -Current home readings: 118/75 -Hx OSA; did not tolerate CPAP; has Inspire device now; he reports improved quality of sleep -Current treatment: Telmisartan 20 mg daily- Appropriate, Effective, Safe, Accessible Carvedilol 3.125 mg BID- Appropriate, Effective, Safe,  Accessible -  Medications previously tried: lisinopril (cough), HCTZ -Current dietary habits: no recent changes to diet/lifestyle -Current exercise habits: started walking up/down hill by his home each day (about 2/3 mile) -Counseled to monitor BP at home periodically -Recommended to continue current medication;   Hyperlipidemia: (LDL goal < 70) -Controlled - LDL 84 (11/2021), unable to tolerate higher doses of atorvastatin -Aortic atherosclerosis on CT -Current treatment: Atorvastatin 20 mg daily - Appropriate, Effective, Safe, Accessible Ezetimibe 10 mg daily - Appropriate, Effective, Safe, Accessible Aspirin 81 mg daily - Appropriate, Effective, Safe, Accessible -Medications previously tried: simvastatin  -Educated on Cholesterol goals;  -Recommended to continue current medication   Atrial Fibrillation (Goal: prevent stroke and major bleeding) -Controlled -Follows with Dr Rockey Situ, Dr Caryl Comes; original Afib episode < 12 hrs, per pacemaker Afib burden is low; he is off anticoagulation -CHADSVASC: 4 -Current treatment: Carvedilol 3.125 mg BID - Appropriate, Effective, Safe, Accessible Aspirin 81 mg daily - Appropriate, Effective, Safe, Accessible -Medications previously tried: Xarelto -Reviewed afib history, it is reasonable to continue without anticoagulation per cardiology -Recommended to continue current medication  COPD (Goal: control symptoms and prevent exacerbations) -Controlled - not needing rescue/nebulizer -Quit smoking Jan 2023! -Gold Grade: Gold 1 (FEV1>80%) -Current COPD Classification:  A (low sx, <2 exacerbations/yr) -MMRC/CAT score: none  -Pulmonary function testing: 06/19/19 FEV1 85% -Exacerbations requiring treatment in last 6 months: 1  -Current treatment  Albuterol HFA - 2 puffs every 6 hours PRN - Appropriate, Effective, Safe, Accessible Breztri 2 puff BID (PAP)- Appropriate, Effective, Safe, Accessible Duoneb PRN - Appropriate, Effective, Safe,  Accessible -Medications previously tried: Symbicort -Patient reports consistent use of maintenance inhaler -Frequency of rescue inhaler use: very rare -Counseled on Differences between maintenance and rescue inhalers -Recommended to continue current medication   Weak urine flow / Nocturia (Goal: manage symptoms) -Controlled - PSA 0.26 (11/2020) normal -Current treatment  Tadalafil 5 mg HS - Appropriate, Effective, Safe, Accessible -Medications previously tried: tamsulosin -Recommend to continue current medication  Depression/Anxiety (Goal: manage symptoms) -Controlled -PHQ9: 5 (12/2021) -Connected with PCP for mental health support -Current treatment: Bupropion SR 150 mg BID - Appropriate, Effective, Safe, Accessible -Medications previously tried/failed: n/a -Educated on Benefits of medication for symptom control -Recommended to continue current medication  Health Maintenance -Vaccine gaps: Shingrix -99991111 deficiency: Folic acid 1 mg daily   Charlene Brooke, PharmD, BCACP Clinical Pharmacist [facility] at [facility] [phone]

## 2022-03-31 NOTE — Telephone Encounter (Signed)
Care Management & Coordination Services Outreach Note  03/31/2022 Name: Brandon Lopez MRN: [id number] DOB: [birth year]  Referred by: Pleas Koch, NP  Patient had a phone appointment scheduled with clinical pharmacist today.  An unsuccessful telephone outreach was attempted today. The patient was referred to the pharmacist for assistance with medications, care management and care coordination.   Patient will NOT be penalized in any way for missing a Care Management & Coordination Services appointment. The no-show fee does not apply.  If possible, a message was left to return call to: [phone] or to [facility].  Charlene Brooke, PharmD, BCACP Clinical Pharmacist [facility] at [facility] [phone]

## 2022-03-31 NOTE — Patient Instructions (Signed)
Visit Information  Phone number for Pharmacist: [phone]  Thank you for meeting with me to discuss your medications! Below is a summary of what we talked about during the visit:   Summary: -HTN: BP at goal; pt also recently had Inspire device, he reports improved sleep quality -COPD: discussed inhaler usage; he is compliant with Breztri and has not needed albuterol/Duoneb for rescue lately -Depression: he report bupropion dose increase has helped, he feels mood is stable overall   Recommendations/Changes made from today's visit: -No med changes  Follow up plan: -Health Concierge will call patient 3 months for COPD update -Pharmacist follow up PRN -Pulmonary appts 04/20/22, 05/04/22   Charlene Brooke, PharmD, BCACP Clinical Pharmacist [facility] at [facility] [phone]

## 2022-04-02 DIAGNOSIS — J441 Chronic obstructive pulmonary disease with (acute) exacerbation: Secondary | ICD-10-CM | POA: Diagnosis not present

## 2022-04-09 NOTE — Progress Notes (Signed)
Remote pacemaker transmission.   

## 2022-04-10 NOTE — Progress Notes (Unsigned)
    Brandon Mooty T. Theresea Trautmann, MD, Brandon Lopez at [facility] [place], [address]  Phone: [phone]  FAX: [phone]  Brandon Lopez - 67 y.o. male  MRN [id number]  Date of Birth: December 24, 1955  Date: 04/12/2022  PCP: Pleas Koch, NP  Referral: Pleas Koch, NP  No chief complaint on file.  Subjective:   Brandon Lopez is a 67 y.o. very pleasant male patient with There is no height or weight on file to calculate BMI. who presents with the following:  Pleasant gentleman with ongoing left-sided shoulder pain.  I saw him roughly 6 months ago with some shoulder pain and arthritis exacerbation.  At that time and to do an intra-articular shoulder injection.  He also does take some NSAIDs daily for his back, and this also helps with his shoulder.    Review of Systems is noted in the HPI, as appropriate  Objective:   There were no vitals taken for this visit.  GEN: No acute distress; alert,appropriate. PULM: Breathing comfortably in no respiratory distress PSYCH: Normally interactive.   Laboratory and Imaging Data:  Assessment and Plan:   ***

## 2022-04-12 ENCOUNTER — Encounter: Payer: Self-pay | Admitting: Family Medicine

## 2022-04-12 ENCOUNTER — Ambulatory Visit (INDEPENDENT_AMBULATORY_CARE_PROVIDER_SITE_OTHER): Payer: PPO | Admitting: Family Medicine

## 2022-04-12 VITALS — BP 124/76 | HR 88 | Temp 97.5°F | Ht 65.0 in | Wt 196.1 lb

## 2022-04-12 DIAGNOSIS — M19012 Primary osteoarthritis, left shoulder: Secondary | ICD-10-CM | POA: Diagnosis not present

## 2022-04-12 MED ORDER — TRIAMCINOLONE ACETONIDE 40 MG/ML IJ SUSP
40.0000 mg | Freq: Once | INTRAMUSCULAR | Status: AC
  Administered 2022-04-12: 40 mg via INTRA_ARTICULAR

## 2022-04-18 NOTE — Progress Notes (Signed)
HPI M former smoker followed for OSA, COPD (100 pkyrs, QS [id number]), Asbestos exposure, complicated by CHF/Pacemakert,, HTN, PAFib, COPD, HxTobacco Use, OSA, Diverticulosis, Osteoarthritis, ETOH, Anxiety/Depression, Hx Positive TB PPD,  -Ventolin hfa, Breztri,  NPSG [place] 09/10/15- AHI 44.3/ hr, desaturation to 83%, body weight 184 lbs, CPAP to 10 Inspire Hypoglossal Nerve Stimulator 01/05/22-Dr Genevie Cheshire Activation 02/09/22  ====================================================================================== .  03/09/22-66 yoM former smoker followed for OSA, COPD (100 pkyrs, QS [id number]), Asbestos exposure, complicated by CHF/Pacemakert,, HTN, PAFib, COPD, HxTobacco Use, OSA, Diverticulosis, Osteoarthritis, ETOH, Anxiety/Depression, Hx Positive TB PPD,  -Ventolin hfa, Breztri,  CT chest low dose screen/ Asbestos exposure- Inspire Hypoglossal Nerve Stimulator 01/05/22-Dr Genevie Cheshire Activation 02/09/22 Body weight today- Covid vax-none Flu vax-had 1 month post inspire activation. Patient states he is getting good rest [place], [facility] and [place], [place] .  Tonight he will advance to level 5.  Will return in 6 weeks and we will be scheduling Inspire sleep study. He is comfortable with his breathing with no acute changes and continue his current meds. Frequent nocturia is bothersome and he will work with urology.  04/20/22- 66 yoM former smoker followed for OSA, COPD (100 pkyrs, QS [id number]), Asbestos exposure, complicated by CHF/Pacemakert,, HTN, PAFib, COPD, HxTobacco Use, OSA, Diverticulosis, Osteoarthritis, ETOH, Anxiety/Depression, Hx Positive TB PPD,  He had installed and removed asbestos and he had worked in [employer] in addition to a long smoking history. -Ventolin hfa, Breztri,  CT chest low dose screen/ Asbestos exposure-  pending in May    Inspire Hypoglossal Nerve Stimulator placed 01/05/22-Dr Genevie Cheshire Activation 02/09/22 Body weight today-194 lbs                                                          //needs PFT?// Covid vax-none Flu vax-had Inspire titration to level 8 was too much and he is now at level 7. We will schedule a titration sleep study to determine best control level. He will return about 2 weeks after that. He is not having significant breathing complaints otherwise today.   ROS-see HPI   + = positive Constitutional:    weight loss, night sweats, fevers, chills, fatigue, lassitude. HEENT:    headaches, difficulty swallowing, tooth/dental problems, sore throat,       sneezing, itching, ear ache, nasal congestion, post nasal drip, snoring CV:    chest pain, orthopnea, PND, swelling in lower extremities, anasarca,                                   dizziness, palpitations Resp:   +shortness of breath with exertion or at rest.                productive cough,   non-productive cough, coughing up of blood.              change in color of mucus.  wheezing.   Skin:    rash or lesions. GI:  +heartburn, indigestion, abdominal pain, nausea, vomiting, diarrhea,                 change in bowel habits, +loss of appetite GU: dysuria, change in color of urine, no urgency or frequency.   flank pain. MS:   joint pain, stiffness, decreased range  of motion, back pain. Neuro-     nothing unusual Psych:  change in mood or affect.  depression or anxiety.   memory loss.  OBJ- Physical Exam   +overweight General- Alert, Oriented, Affect-appropriate, Distress- none Skin- rash-none, lesions- none, excoriation- none Lymphadenopathy- none Head- atraumatic            Eyes- Gross vision intact, PERRLA, conjunctivae and secretions clear            Ears- Hearing, canals-normal            Nose- Clear, no-Septal dev, mucus, polyps, erosion, perforation             Throat- Mallampati II , mucosa clear , drainage- none, tonsils- atrophic Neck- flexible , trachea midline, no stridor , thyroid nl, carotid no bruit Chest - symmetrical excursion , unlabored            Heart/CV- RRR , no murmur , no gallop  , no rub, nl s1 s2                           - JVD- none , edema- none, stasis changes- none, varices- none           Lung- + clear/ no rhonchi, no distinct crackle,  wheeze- none, cough , dullness-none, rub- none           Chest wall- +pacemaker on right. + Inspire on left Abd-  Br/ Gen/ Rectal- Not done, not indicated Extrem- cyanosis- none, clubbing, none, atrophy- none, strength- nl Neuro- grossly intact to observatio

## 2022-04-20 ENCOUNTER — Encounter: Payer: Self-pay | Admitting: Internal Medicine

## 2022-04-20 ENCOUNTER — Ambulatory Visit (INDEPENDENT_AMBULATORY_CARE_PROVIDER_SITE_OTHER): Payer: PPO | Admitting: Internal Medicine

## 2022-04-20 VITALS — BP 120/82 | HR 86 | Ht 65.0 in | Wt 194.4 lb

## 2022-04-20 DIAGNOSIS — G4733 Obstructive sleep apnea (adult) (pediatric): Secondary | ICD-10-CM | POA: Diagnosis not present

## 2022-04-20 DIAGNOSIS — J449 Chronic obstructive pulmonary disease, unspecified: Secondary | ICD-10-CM | POA: Diagnosis not present

## 2022-04-20 NOTE — Patient Instructions (Signed)
Your sleep schedule for Inspire titration will be April 9- arrive at [facility] building at 8:00PM to spend the night.  We will see you back at the Pulmonary office about 2 weeks after that.

## 2022-04-21 ENCOUNTER — Ambulatory Visit (INDEPENDENT_AMBULATORY_CARE_PROVIDER_SITE_OTHER): Payer: PPO | Admitting: Dermatology

## 2022-04-21 ENCOUNTER — Encounter: Payer: Self-pay | Admitting: Dermatology

## 2022-04-21 VITALS — BP 125/81 | HR 89

## 2022-04-21 DIAGNOSIS — L918 Other hypertrophic disorders of the skin: Secondary | ICD-10-CM

## 2022-04-21 DIAGNOSIS — L578 Other skin changes due to chronic exposure to nonionizing radiation: Secondary | ICD-10-CM | POA: Diagnosis not present

## 2022-04-21 DIAGNOSIS — Z1283 Encounter for screening for malignant neoplasm of skin: Secondary | ICD-10-CM

## 2022-04-21 DIAGNOSIS — L821 Other seborrheic keratosis: Secondary | ICD-10-CM

## 2022-04-21 DIAGNOSIS — L820 Inflamed seborrheic keratosis: Secondary | ICD-10-CM

## 2022-04-21 DIAGNOSIS — D692 Other nonthrombocytopenic purpura: Secondary | ICD-10-CM | POA: Diagnosis not present

## 2022-04-21 DIAGNOSIS — D229 Melanocytic nevi, unspecified: Secondary | ICD-10-CM

## 2022-04-21 DIAGNOSIS — R238 Other skin changes: Secondary | ICD-10-CM

## 2022-04-21 DIAGNOSIS — L570 Actinic keratosis: Secondary | ICD-10-CM | POA: Diagnosis not present

## 2022-04-21 DIAGNOSIS — D1801 Hemangioma of skin and subcutaneous tissue: Secondary | ICD-10-CM

## 2022-04-21 DIAGNOSIS — L814 Other melanin hyperpigmentation: Secondary | ICD-10-CM

## 2022-04-21 NOTE — Patient Instructions (Addendum)
Cryotherapy Aftercare  Wash gently with soap and water everyday.   Apply Vaseline and Band-Aid daily until healed.     Due to recent changes in healthcare laws, you may see results of your pathology and/or laboratory studies on MyChart before the doctors have had a chance to review them. We understand that in some cases there may be results that are confusing or concerning to you. Please understand that not all results are received at the same time and often the doctors may need to interpret multiple results in order to provide you with the best plan of care or course of treatment. Therefore, we ask that you please give us 2 business days to thoroughly review all your results before contacting the office for clarification. Should we see a critical lab result, you will be contacted sooner.   If You Need Anything After Your Visit  If you have any questions or concerns for your doctor, please call our main line at [phone] and press option 4 to reach your doctor's medical assistant. If no one answers, please leave a voicemail as directed and we will return your call as soon as possible. Messages left after 4 pm will be answered the following business day.   You may also send us a message via MyChart. We typically respond to MyChart messages within 1-2 business days.  For prescription refills, please ask your pharmacy to contact our office. Our fax number is [phone].  If you have an urgent issue when the clinic is closed that cannot wait until the next business day, you can page your doctor at the number below.    Please note that while we do our best to be available for urgent issues outside of office hours, we are not available 24/7.   If you have an urgent issue and are unable to reach us, you may choose to seek medical care at your doctor's office, retail clinic, urgent care center, or emergency room.  If you have a medical emergency, please immediately call 911 or go to the  emergency department.  Pager Numbers  - Dr. Kowalski: [phone]  - Dr. Moye: [phone]  - Dr. Stewart: [phone]  In the event of inclement weather, please call our main line at [phone] for an update on the status of any delays or closures.  Dermatology Medication Tips: Please keep the boxes that topical medications come in in order to help keep track of the instructions about where and how to use these. Pharmacies typically print the medication instructions only on the boxes and not directly on the medication tubes.   If your medication is too expensive, please contact our office at [phone] option 4 or send us a message through MyChart.   We are unable to tell what your co-pay for medications will be in advance as this is different depending on your insurance coverage. However, we may be able to find a substitute medication at lower cost or fill out paperwork to get insurance to cover a needed medication.   If a prior authorization is required to get your medication covered by your insurance company, please allow us 1-2 business days to complete this process.  Drug prices often vary depending on where the prescription is filled and some pharmacies may offer cheaper prices.  The website www.goodrx.com contains coupons for medications through different pharmacies. The prices here do not account for what the cost may be with help from insurance (it may be cheaper with your insurance), but the website can   give you the price if you did not use any insurance.  - You can print the associated coupon and take it with your prescription to the pharmacy.  - You may also stop by our office during regular business hours and pick up a GoodRx coupon card.  - If you need your prescription sent electronically to a different pharmacy, notify our office through [facility] MyChart or by phone at [phone] option 4.     Si Usted Necesita Algo Despus de Su Visita  Tambin puede  enviarnos un mensaje a travs de MyChart. Por lo general respondemos a los mensajes de MyChart en el transcurso de 1 a 2 das hbiles.  Para renovar recetas, por favor pida a su farmacia que se ponga en contacto con nuestra oficina. Nuestro nmero de fax es el [phone].  Si tiene un asunto urgente cuando la clnica est cerrada y que no puede esperar hasta el siguiente da hbil, puede llamar/localizar a su doctor(a) al nmero que aparece a continuacin.   Por favor, tenga en cuenta que aunque hacemos todo lo posible para estar disponibles para asuntos urgentes fuera del horario de oficina, no estamos disponibles las 24 horas del da, los 7 das de la semana.   Si tiene un problema urgente y no puede comunicarse con nosotros, puede optar por buscar atencin mdica  en el consultorio de su doctor(a), en una clnica privada, en un centro de atencin urgente o en una sala de emergencias.  Si tiene una emergencia mdica, por favor llame inmediatamente al 911 o vaya a la sala de emergencias.  Nmeros de bper  - Dr. Kowalski: [phone]  - Dra. Moye: [phone]  - Dra. Stewart: [phone]  En caso de inclemencias del tiempo, por favor llame a nuestra lnea principal al [phone] para una actualizacin sobre el estado de cualquier retraso o cierre.  Consejos para la medicacin en dermatologa: Por favor, guarde las cajas en las que vienen los medicamentos de uso tpico para ayudarle a seguir las instrucciones sobre dnde y cmo usarlos. Las farmacias generalmente imprimen las instrucciones del medicamento slo en las cajas y no directamente en los tubos del medicamento.   Si su medicamento es muy caro, por favor, pngase en contacto con nuestra oficina llamando al [phone] y presione la opcin 4 o envenos un mensaje a travs de MyChart.   No podemos decirle cul ser su copago por los medicamentos por adelantado ya que esto es diferente dependiendo de la cobertura de su seguro.  Sin embargo, es posible que podamos encontrar un medicamento sustituto a menor costo o llenar un formulario para que el seguro cubra el medicamento que se considera necesario.   Si se requiere una autorizacin previa para que su compaa de seguros cubra su medicamento, por favor permtanos de 1 a 2 das hbiles para completar este proceso.  Los precios de los medicamentos varan con frecuencia dependiendo del lugar de dnde se surte la receta y alguna farmacias pueden ofrecer precios ms baratos.  El sitio web www.goodrx.com tiene cupones para medicamentos de diferentes farmacias. Los precios aqu no tienen en cuenta lo que podra costar con la ayuda del seguro (puede ser ms barato con su seguro), pero el sitio web puede darle el precio si no utiliz ningn seguro.  - Puede imprimir el cupn correspondiente y llevarlo con su receta a la farmacia.  - Tambin puede pasar por nuestra oficina durante el horario de atencin regular y recoger una tarjeta de cupones de GoodRx.  -   Si necesita que su receta se enve electrnicamente a una farmacia diferente, informe a nuestra oficina a travs de MyChart de [place] o por telfono llamando al [phone] y presione la opcin 4.  

## 2022-04-21 NOTE — Progress Notes (Signed)
Follow-Up Visit   Subjective  Brandon Lopez is a 67 y.o. male who presents for the following: Skin Cancer Screening and Upper Body Skin Exam, hx of precancers.  The patient presents for Upper-Body Skin Exam (for skin cancer screening and mole check. The patient has spots, moles and lesions to be evaluated, some may be new or changing and the patient has concerns that these could be cancer.  The following portions of the chart were reviewed this encounter and updated as appropriate: medications, allergies, medical history  Review of Systems:  No other skin or systemic complaints except as noted in HPI or Assessment and Plan.  Objective  Well appearing patient in no apparent distress; mood and affect are within normal limits.  All skin waist up examined. Relevant physical exam findings are noted in the Assessment and Plan.   Assessment & Plan    Lentigines, Seborrheic Keratoses, Hemangiomas - Benign normal skin lesions - Benign-appearing - Call for any changes  Melanocytic Nevi - Tan-brown and/or pink-flesh-colored symmetric macules and papules - Benign appearing on exam today - Observation - Call clinic for new or changing moles - Recommend daily use of broad spectrum spf 30+ sunscreen to sun-exposed areas.   Actinic Damage - Chronic condition, secondary to cumulative UV/sun exposure - diffuse scaly erythematous macules with underlying dyspigmentation - Recommend daily broad spectrum sunscreen SPF 30+ to sun-exposed areas, reapply every 2 hours as needed.  - Staying in the shade or wearing long sleeves, sun glasses (UVA+UVB protection) and wide brim hats (4-inch brim around the entire circumference of the hat) are also recommended for sun protection.  - Call for new or changing lesions.  Purpura - Chronic; persistent and recurrent.  Treatable, but not curable. Arms  - Violaceous macules and patches - Benign - Related to trauma, age, sun damage and/or use of blood  thinners, chronic use of topical and/or oral steroids - Observe - Can use OTC arnica containing moisturizer such as Dermend Bruise Formula if desired - Call for worsening or other concerns   Acrochordons (Skin Tags) - Fleshy, skin-colored pedunculated papules - Benign appearing.  - Observe. - If desired, they can be removed with an in office procedure that is not covered by insurance. - Please call the clinic if you notice any new or changing lesions.   [place]  Right ear antihelix  Benign  Watch for changes   INFLAMED SEBORRHEIC KERATOSIS Exam: Erythematous keratotic or waxy stuck-on papule or plaque.  Symptomatic, irritating, patient would like treated.  Benign-appearing.  Call clinic for new or changing lesions.   Prior to procedure, discussed risks of blister formation, small wound, skin dyspigmentation, or rare scar following treatment. Recommend Vaseline ointment to treated areas while healing.  Destruction Procedure Note Destruction method: cryotherapy   Informed consent: discussed and consent obtained   Lesion destroyed using liquid nitrogen: Yes   Outcome: patient tolerated procedure well with no complications   Post-procedure details: wound care instructions given   Locations: right cheek x 1 # of Lesions Treated: 1   ACTINIC KERATOSIS Exam: Erythematous thin papules/macules with gritty scale  Actinic keratoses are precancerous spots that appear secondary to cumulative UV radiation exposure/sun exposure over time. They are chronic with expected duration over 1 year. A portion of actinic keratoses will progress to squamous cell carcinoma of the skin. It is not possible to reliably predict which spots will progress to skin cancer and so treatment is recommended to prevent development of skin cancer.  Recommend daily broad spectrum sunscreen SPF 30+ to sun-exposed areas, reapply every 2 hours as needed.  Recommend staying in the shade or wearing long sleeves, sun  glasses (UVA+UVB protection) and wide brim hats (4-inch brim around the entire circumference of the hat). Call for new or changing lesions.  Treatment Plan:  Prior to procedure, discussed risks of blister formation, small wound, skin dyspigmentation, or rare scar following cryotherapy. Recommend Vaseline ointment to treated areas while healing.  Destruction Procedure Note Destruction method: cryotherapy   Informed consent: discussed and consent obtained   Lesion destroyed using liquid nitrogen: Yes   Outcome: patient tolerated procedure well with no complications   Post-procedure details: wound care instructions given   Locations: scalp x 3 # of Lesions Treated: 3   Skin cancer screening performed today.  Return in about 1 year (around 04/21/2023) for TBSE, hx of Aks .  IMarye Round, CMA, am acting as scribe for Sarina Ser, MD .   Documentation: I have reviewed the above documentation for accuracy and completeness, and I agree with the above.  Sarina Ser, MD

## 2022-04-23 ENCOUNTER — Encounter: Payer: Self-pay | Admitting: Internal Medicine

## 2022-05-03 DIAGNOSIS — J441 Chronic obstructive pulmonary disease with (acute) exacerbation: Secondary | ICD-10-CM | POA: Diagnosis not present

## 2022-05-04 ENCOUNTER — Ambulatory Visit (HOSPITAL_BASED_OUTPATIENT_CLINIC_OR_DEPARTMENT_OTHER): Payer: PPO | Attending: Internal Medicine | Admitting: Internal Medicine

## 2022-05-04 DIAGNOSIS — G4733 Obstructive sleep apnea (adult) (pediatric): Secondary | ICD-10-CM

## 2022-05-08 ENCOUNTER — Other Ambulatory Visit: Payer: Self-pay | Admitting: Primary Care

## 2022-05-08 ENCOUNTER — Other Ambulatory Visit: Payer: Self-pay | Admitting: Internal Medicine

## 2022-05-08 DIAGNOSIS — F32A Depression, unspecified: Secondary | ICD-10-CM

## 2022-05-09 DIAGNOSIS — G4733 Obstructive sleep apnea (adult) (pediatric): Secondary | ICD-10-CM

## 2022-05-09 NOTE — Procedures (Signed)
Patient Name: Brandon Lopez, Brandon Lopez Date: 05/04/2022 Gender: Male D.O.B: [birth year] Age (years): 46 Referring Provider: Jetty Duhamel MD, ABSM Height (inches): 65 Interpreting Physician: Jetty Duhamel MD, ABSM Weight (lbs): 185 RPSGT: Ulyess Mort BMI: 31 MRN: [id number] Neck Size: 17.50  CLINICAL INFORMATION The patient is referred for an Inspire Voltage Titration to treat sleep apnea.  Date of NPSG, Split Night or HST: NPSG [place] 09/10/15 AHI 44.3/ hr, desaturation to 83%, body weight 184 lbs  SLEEP STUDY TECHNIQUE As per the AASM Manual for the Scoring of Sleep and Associated Events v2.3 (April 2016) with a hypopnea requiring 4% desaturations.  The channels recorded and monitored were frontal, central and occipital EEG, electrooculogram (EOG), submentalis EMG (chin), nasal and oral airflow, thoracic and abdominal wall motion, anterior tibialis EMG, snore microphone, electrocardiogram, and pulse oximetry. Continuous positive airway pressure (CPAP) was initiated at the beginning of the study and titrated to treat sleep-disordered breathing.  MEDICATIONS Medications self-administered by patient taken the night of the study : N/A  TECHNICIAN COMMENTS Comments added by technician: PATIENT WAS ORDERED AS AN INSPIRE TITRATION. Comments added by scorer: N/A  RESPIRATORY PARAMETERS Peak Voltage): 1.8 AHI at Peak voltagee (/hr): 21.1 Overall Minimal O2 (%): 82.0 Supine % at Optimal voltage (%): 17 Minimal O2 at Peak voltage (%): 88.0   SLEEP ARCHITECTURE The study was initiated at 10:44:24 PM and ended at 4:39:47 AM.  Sleep onset time was 12.3 minutes and the sleep efficiency was 69.0%. The total sleep time was 245.1 minutes.  The patient spent 24.1% of the night in stage N1 sleep, 54.9% in stage N2 sleep, 0.0% in stage N3 and 21% in REM.Stage REM latency was 43.0 minutes  Wake after sleep onset was 98.0. Alpha intrusion was absent. Supine sleep was 22.84%.  CARDIAC  DATA The 2 lead EKG demonstrated sinus rhythm. The mean heart rate was 72.7 beats per minute. Other EKG findings include: PVCs.  LEG MOVEMENT DATA The total Periodic Limb Movements of Sleep (PLMS) were 0. The PLMS index was 0.0. A PLMS index of <15 is considered normal in adults.  IMPRESSIONS - The patient was titrated to a voltage of 1.8 in time available. At initial voltage of 0, AHI (4%) was 57.6/ hr with minimum O2 saturation 85%/ mean 91.7%. - At voltage 1.8, AHI (4%) was 22.5/ hr with minimum O2 saturation 88% / mean 91.8%. - Central sleep apnea was not noted during this titration (CAI = 1/h). - Patient snored with loud snoring volume during this titration study. - 2-lead EKG demonstrated: PVCs - Clinically significant periodic limb movements were not noted during this study. Arousals associated with PLMs were rare. - Restroom x 3 ( Inspire off). - Patient was discharged with Inspire voltage set at 1.1.  DIAGNOSIS - Obstructive Sleep Apnea (G47.33)  RECOMMENDATIONS - Incomplete control of apneas at Inspire voltage 1.8. Suggest gradually advancing voltage as tolerated with future sleep study to assess. - Be careful with alcohol, sedatives and other CNS depressants that may worsen sleep apnea and disrupt normal sleep architecture. - Sleep hygiene should be reviewed to assess factors that may improve sleep quality. - Weight management and regular exercise should be initiated or continued.  [Electronically signed] 05/09/2022 10:13 AM  Jetty Duhamel MD, ABSM Diplomate, American Board of Sleep Medicine NPI: [id number]                        Jetty Duhamel Diplomate, American Board of Sleep Medicine  ELECTRONICALLY SIGNED ON:  05/09/2022, 9:52 AM [facility] PH: [phone]   FX: [phone] ACCREDITED BY THE AMERICAN ACADEMY OF SLEEP MEDICINE

## 2022-05-12 ENCOUNTER — Encounter: Payer: Self-pay | Admitting: Internal Medicine

## 2022-05-12 NOTE — Progress Notes (Unsigned)
Cardiology Office Note Date:  05/13/2022  Patient ID:  Brandon Lopez, Brandon Lopez 04/30/1955, MRN [id number] PCP:  Doreene Nest, NP  Cardiologist:  Julien Nordmann, MD Electrophysiologist: Sherryl Manges, MD   Chief Complaint: 1 year follow-up  History of Present Illness: Brandon Lopez is a 67 y.o. male with PMH notable for SCAF, PVCs, HFpEF, HTN, SND s/p PPM, orthostatic intolerance, OSA s/p inspira; seen today for Sherryl Manges, MD for routine electrophysiology followup.  Last saw Dr. Graciela Husbands 04/2021, had an episode of profound fatigue about a month prior to appt, had resolved. Lightheadedness improved;  he had quit smoking.  Today, patient states that he is doing very well from a cardiac standpoint. No palpitations or lightheadedness. Able to sleep flat, good appetite  He has resumed smoking; had an episode of increased stress. He is recently s/p inspira insertion 12/2021 . He continues to not sleep long stretches, but has more restorative sleep. Much improved energy, feels more rested.  He continues to drink ETOH about 2 cases beer / week. Is not interested in reducing consumption.  Is having worsening depression, plans to talk to PCP about it at next appointment. Does not enjoy current living situation in apartment, is [place].   Enjoys panning for gold, being outdoors.  .  he denies chest pain, palpitations, dyspnea, PND, orthopnea, nausea, vomiting, dizziness, syncope, edema, weight gain, or early satiety.     Device Information: Biotronik dual chamber PPM, imp 08/2012; dx SND, bradycardia  Past Medical History:  Diagnosis Date   Actinic keratosis    Arthritis    Chronic systolic CHF (congestive heart failure)    a. echo 6/16: EF 55-60%, Gr1DD, LA nl, PASP nl; b. echo 7/19: EF 40-45%, unable to exclude RWMA, Gr1DD, LA nl, RVSF nl, PASP nl   COPD (chronic obstructive pulmonary disease)    Depression    Diverticulosis     Found during colonoscopy   Edema 07/21/2016   History of kidney stones    Hypertension    Left facial swelling 12/11/2021   Left lower quadrant abdominal pain 10/27/2021   Lightheadedness 04/21/2021   PAF (paroxysmal atrial fibrillation)    a. short episodes previously noted on PPM interrogation; b. started on Xarelto 07/2017 given further episodes of Afib noted during interrogation; c. 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 Biotronik; Henrietta Hoover DR-T [id number]; placed 08/2012   Shortness of breath 04/21/2021   Sleep apnea    does not use CPAP   Symptomatic bradycardia    a. s/p Biotronik; Henrietta Hoover DR-T [id number]; placed 08/2012 at [facility]   Syncope 08/10/2017    Past Surgical History:  Procedure Laterality Date   APPENDECTOMY  age 47   CARDIAC CATHETERIZATION     7 years ago   COLONOSCOPY N/A 05/27/2014   Procedure: COLONOSCOPY;  Surgeon: Wallace Cullens, MD;  Location: [facility];  Service: Gastroenterology;  Laterality: N/A;   DRUG INDUCED ENDOSCOPY Bilateral 11/11/2021   Procedure: DRUG INDUCED ENDOSCOPY;  Surgeon: Christia Reading, MD;  Location: [facility];  Service: ENT;  Laterality: Bilateral;   FLOOR OF MOUTH BIOPSY N/A 01/05/2022   Procedure: BIOPSY OF PALATE;  Surgeon: Christia Reading, MD;  Location: [facility];  Service: ENT;  Laterality: N/A;   IMPLANTATION OF HYPOGLOSSAL NERVE STIMULATOR Left 01/05/2022  Procedure: IMPLANTATION OF HYPOGLOSSAL NERVE STIMULATOR;  Surgeon: Christia Reading, MD;  Location: [facility];  Service: ENT;  Laterality: Left;   INSERT / REPLACE / REMOVE PACEMAKER     3 years ago    Current Outpatient Medications  Medication Instructions   albuterol (VENTOLIN HFA) 108 (90 Base) MCG/ACT inhaler INHALE 2 PUFFS BY MOUTH EVERY 6 HOURS AS NEEDED FOR WHEEZING AND FOR SHORTNESS OF BREATH   aspirin EC 81 mg,  Oral, Daily, Swallow whole.   atorvastatin (LIPITOR) 20 mg, Oral, Daily, for cholesterol.   Budeson-Glycopyrrol-Formoterol (BREZTRI AEROSPHERE) 160-9-4.8 MCG/ACT AERO 2 puffs, Inhalation, 2 times daily   buPROPion (WELLBUTRIN SR) 150 MG 12 hr tablet TAKE 1 TABLET BY MOUTH TWICE DAILY FOR DEPRESSION   carvedilol (COREG) 3.125 mg, Oral, 2 times daily   ezetimibe (ZETIA) 10 mg, Oral, Daily, Please call [phone] to schedule yearly follow up visit.   folic acid (FOLVITE) 1 mg, Oral, Daily   tadalafil (CIALIS) 5 MG tablet TAKE 1 TABLET BY MOUTH ONCE DAILY FOR  FREQUENT  URINATION   telmisartan (MICARDIS) 20 mg, Oral, Daily, for blood pressure    Social History:  The patient  reports that he quit smoking about 15 months ago. His smoking use included cigarettes. He started smoking about 60 years ago. He has a 100.00 pack-year smoking history. He has never used smokeless tobacco. He reports current alcohol use of about 8.0 - 14.0 standard drinks of alcohol per week. He reports current drug use. Drug: Marijuana.   Family History:  The patient's family history includes Arthritis in his mother; Cancer in his paternal grandfather; Heart disease in his father; Hyperlipidemia in his father; Hypertension in his father.  ROS:  Please see the history of present illness. All other systems are reviewed and otherwise negative.   PHYSICAL EXAM:  VS:  BP 128/88 (BP Location: Left Arm, Patient Position: Sitting, Cuff Size: Normal)   Pulse 82   Ht  (1.651 m)   Wt 194 lb (88 kg)   SpO2 98%   BMI 32.28 kg/m  BMI: Body mass index is 32.28 kg/m.  GEN- The patient is well appearing, alert and oriented x 3 today.   Lungs- Clear to ausculation bilaterally, normal work of breathing.  Heart- Regular rate and rhythm, no murmurs, rubs or gallops Extremities- Trace peripheral edema, warm, dry Skin-  device pocket well-healed   Device interrogation done today and reviewed by myself:  Battery 3.5 years Lead  thresholds, impedence, sensing stable  Frequent brief AF/AT episodes, no longer than 5 minutes. Last episode 01/28/22 Overall AF burden 0% No changes made today  EKG is ordered. Personal review of EKG from today shows:  A-paced at 82bpm,   Recent Labs: 05/29/2021: NT-Pro BNP <36 06/17/2021: TSH 2.67 10/27/2021: ALT 25; BUN 11; Creatinine, Ser 0.70; Hemoglobin 15.6; Platelets 191.0; Potassium 4.1; Sodium 135  01/12/2022: Cholesterol 153; Direct LDL 84.0; HDL 41.10; Total CHOL/HDL Ratio 4; Triglycerides 259.0; VLDL 51.8   CrCl cannot be calculated (Patient's most recent lab result is older than the maximum 21 days allowed.).   Wt Readings from Last 3 Encounters:  05/13/22 194 lb (88 kg)  05/04/22 185 lb (83.9 kg)  04/20/22 194 lb 6.4 oz (88.2 kg)     Additional studies reviewed include: Previous EP, cardiology notes.   TTE, 04/16/2021  1. Left ventricular ejection fraction, by estimation, is 60 to 65%. The left ventricle has normal function. The left ventricle has no regional wall motion abnormalities.  Left ventricular diastolic parameters are consistent with Grade I diastolic dysfunction (impaired relaxation).   2. Right ventricular systolic function is normal. The right ventricular size is normal. There is normal pulmonary artery systolic pressure. The estimated right ventricular systolic pressure is 21.6 mmHg.   3. The mitral valve is normal in structure. Mild mitral valve regurgitation. No evidence of mitral stenosis.   4. The aortic valve was not well visualized. Aortic valve regurgitation is not visualized. No aortic stenosis is present.   5. The inferior vena cava is normal in size with greater than 50% respiratory variability, suggesting right atrial pressure of 3 mmHg.   NM Myocardial spect, 08/26/2016 Pharmacological myocardial perfusion imaging study with no significant  Ischemia Small region mild fixed defect apical wall on attenuation corrected images, unable to exclude processing  air No apical defect noted on non-attenuation corrected images Non-attenuation corrected images with diaphragmatic attenuation/mild fixed decreased inferior wall perfusion. Defect resolved with attenuation correction Normal wall motion, EF estimated at 57% No EKG changes concerning for ischemia at peak stress or in recovery. Low risk scan  TTE, 08/11/2017 - Left ventricle: The cavity size was normal. Systolic function was mildly to moderately reduced. The estimated ejection fraction was in the range of 40% to 45%. Regional wall motion abnormalities cannot be excluded. Doppler parameters are consistent with abnormal left ventricular relaxation (grade 1 diastolic dysfunction).  - Left atrium: The atrium was normal in size.  - Right ventricle: Pacer wire or catheter noted in right ventricle. Systolic function was normal.  - Pulmonary arteries: Systolic pressure was within the normal range.    ASSESSMENT AND PLAN:  #) SND s/p Biotronik dual chamber PPM Device functioning well, see paceart for details  #) SCAD #) OSA s/p inspira No sustained AF episodes, longest episode Overall burden 0%  No OAC d/t SCAF Significantly fewer episodes since Inspira insertion Cont coreg  #) HFpEF NYHA class 2 symptoms Warm and dry on exam today GDMT includes coreg, telmisartan; consider spiro at follow-up  #) ETOH use #) tobacco abuse #) depression Encouraged to reduce etoh and stop smoking Encouraged to discuss depression treatments with PCP, which he intends to do    Current medicines are reviewed at length with the patient today.   The patient does not have concerns regarding his medicines.  The following changes were made today:  none  Labs/ tests ordered today include:  Orders Placed This Encounter  Procedures   EKG 12-Lead     Disposition: Follow up with Dr. Graciela Husbands or EP APP in 12 months   Signed, Sherie Don, NP  05/13/22  12:49 PM  Electrophysiology [facility]

## 2022-05-12 NOTE — Assessment & Plan Note (Signed)
Pending sleep study to assess best voltage level for OSA control, with office f/u after that.

## 2022-05-12 NOTE — Assessment & Plan Note (Signed)
Continue Breztri and albuterol hfa Consider PFT

## 2022-05-13 ENCOUNTER — Ambulatory Visit: Payer: PPO | Attending: Cardiology | Admitting: Cardiology

## 2022-05-13 ENCOUNTER — Encounter: Payer: Self-pay | Admitting: Cardiology

## 2022-05-13 VITALS — BP 128/88 | HR 82 | Ht 65.0 in | Wt 194.0 lb

## 2022-05-13 DIAGNOSIS — I10 Essential (primary) hypertension: Secondary | ICD-10-CM

## 2022-05-13 DIAGNOSIS — I495 Sick sinus syndrome: Secondary | ICD-10-CM

## 2022-05-13 DIAGNOSIS — Z950 Presence of cardiac pacemaker: Secondary | ICD-10-CM

## 2022-05-13 DIAGNOSIS — I480 Paroxysmal atrial fibrillation: Secondary | ICD-10-CM | POA: Diagnosis not present

## 2022-05-13 LAB — CUP PACEART INCLINIC DEVICE CHECK
Date Time Interrogation Session: 20240418125808
Implantable Lead Connection Status: 753985
Implantable Lead Connection Status: 753985
Implantable Lead Implant Date: 20140801
Implantable Lead Implant Date: 20140801
Implantable Lead Location: 753859
Implantable Lead Location: 753860
Implantable Lead Model: 362
Implantable Lead Serial Number: 29356730
Implantable Lead Serial Number: 29392661
Implantable Pulse Generator Implant Date: 20140801
Pulse Gen Serial Number: 68053398

## 2022-05-13 NOTE — Patient Instructions (Signed)
Medication Instructions:  Your physician recommends that you continue on your current medications as directed. Please refer to the Current Medication list given to you today.  *If you need a refill on your cardiac medications before your next appointment, please call your pharmacy*   Lab Work: No labs ordered  If you have labs (blood work) drawn today and your tests are completely normal, you will receive your results only by: MyChart Message (if you have MyChart) OR A paper copy in the mail If you have any lab test that is abnormal or we need to change your treatment, we will call you to review the results.   Testing/Procedures: No labs ordered  Follow-Up: At [facility], you and your health needs are our priority.  As part of our continuing mission to provide you with exceptional heart care, we have created designated Provider Care Teams.  These Care Teams include your primary Cardiologist (physician) and Advanced Practice Providers (APPs -  Physician Assistants and Nurse Practitioners) who all work together to provide you with the care you need, when you need it.  We recommend signing up for the patient portal called "MyChart".  Sign up information is provided on this After Visit Summary.  MyChart is used to connect with patients for Virtual Visits (Telemedicine).  Patients are able to view lab/test results, encounter notes, upcoming appointments, etc.  Non-urgent messages can be sent to your provider as well.   To learn more about what you can do with MyChart, go to [email or web address].    Your next appointment:   1 year(s)  Provider:   Sherryl Manges, MD or Sherie Don, NP

## 2022-05-16 NOTE — Progress Notes (Unsigned)
HPI M former smoker followed for OSA, COPD (100 pkyrs, QS [id number]), Asbestos exposure, complicated by CHF/Pacemakert,, HTN, PAFib, COPD, HxTobacco Use, OSA, Diverticulosis, Osteoarthritis, ETOH, Anxiety/Depression, Hx Positive TB PPD,  -Ventolin hfa, Breztri,  NPSG [place] 09/10/15- AHI 44.3/ hr, desaturation to 83%, body weight 184 lbs, CPAP to 10 Inspire Hypoglossal Nerve Stimulator 01/05/22-Dr Genevie Cheshire Activation 02/09/22  ======================================================================================   04/20/22- 66 yoM former smoker followed for OSA, COPD (100 pkyrs, QS [id number]), Asbestos exposure, complicated by CHF/Pacemakert,, HTN, PAFib, COPD, HxTobacco Use, OSA, Diverticulosis, Osteoarthritis, ETOH, Anxiety/Depression, Hx Positive TB PPD,  He had installed and removed asbestos and he had worked in [employer] in addition to a long smoking history. -Ventolin hfa, Breztri,  CT chest low dose screen/ Asbestos exposure-  pending in May    Inspire Hypoglossal Nerve Stimulator placed 01/05/22-Dr Genevie Cheshire Activation 02/09/22 Body weight today-194 lbs                                                         //needs PFT?// Covid vax-none Flu vax-had Inspire titration to level 8 was too much and he is now at level 7. We will schedule a titration sleep study to determine best control level. He will return about 2 weeks after that. He is not having significant breathing complaints otherwise today.   05/18/22-  66 yoM former smoker followed for OSA, COPD (100 pkyrs, QS [id number]), Asbestos exposure, complicated by CHF/Pacemakert,, HTN, PAFib, COPD, HxTobacco Use, OSA, Diverticulosis, Osteoarthritis, ETOH, Anxiety/Depression, Hx Positive TB PPD,  He had installed and removed asbestos and he had worked in [employer] in addition to a long smoking history. -Ventolin hfa, Breztri,  CT chest low dose screen/ Asbestos exposure-  pending in May    Inspire Hypoglossal Nerve Stimulator  placed 01/05/22-Dr Genevie Cheshire Activation 02/09/22 Inspire Sleep  Study Tiration-05/18/22- - The patient was titrated to a voltage of 1.8 in time available. At initial voltage of 0, AHI (4%) was 57.6/ hr with minimum O2 saturation 85%/ mean 91.7%. - At voltage 1.8, AHI (4%) was 22.5/ hr with minimum O2 saturation 88% / mean 91.8%. - Central sleep apnea was not noted during this titration (CAI = 1/h). - Patient snored with loud snoring volume during this titration study. - 2-lead EKG demonstrated: PVCs - Clinically significant periodic limb movements were not noted during this study. Arousals associated with PLMs were rare. - Restroom x 3 ( Inspire off). - Patient was discharged with Inspire voltage set at 1.1. Body weight today196 lbs   Complains of waking still every 2 hours. Melatonin caused malaise. We discussed and will try trazodone for insomnia. Sleep study results were reviewed.  Inspire technician worked with him at adjusted the signals into his hypoglossal nerve to a unipolar arrangement.  He will rebuild voltage as tolerated try for better control.   ROS-see HPI   + = positive Constitutional:    weight loss, night sweats, fevers, chills, fatigue, lassitude. HEENT:    headaches, difficulty swallowing, tooth/dental problems, sore throat,       sneezing, itching, ear ache, nasal congestion, post nasal drip, snoring CV:    chest pain, orthopnea, PND, swelling in lower extremities, anasarca,  dizziness, palpitations Resp:   +shortness of breath with exertion or at rest.                productive cough,   non-productive cough, coughing up of blood.              change in color of mucus.  wheezing.   Skin:    rash or lesions. GI:  +heartburn, indigestion, abdominal pain, nausea, vomiting, diarrhea,                 change in bowel habits, +loss of appetite GU: dysuria, change in color of urine, no urgency or frequency.   flank pain. MS:   joint pain,  stiffness, decreased range of motion, back pain. Neuro-     nothing unusual Psych:  change in mood or affect.  depression or anxiety.   memory loss.  OBJ- Physical Exam   +overweight General- Alert, Oriented, Affect-appropriate, Distress- none Skin- rash-none, lesions- none, excoriation- none Lymphadenopathy- none Head- atraumatic            Eyes- Gross vision intact, PERRLA, conjunctivae and secretions clear            Ears- Hearing, canals-normal            Nose- Clear, no-Septal dev, mucus, polyps, erosion, perforation             Throat- Mallampati II , mucosa clear , drainage- none, tonsils- atrophic Neck- flexible , trachea midline, no stridor , thyroid nl, carotid no bruit Chest - symmetrical excursion , unlabored           Heart/CV- RRR , no murmur , no gallop  , no rub, nl s1 s2                           - JVD- none , edema- none, stasis changes- none, varices- none           Lung- + clear/ no rhonchi, no distinct crackle,  wheeze- none, cough , dullness-none, rub- none           Chest wall- +pacemaker on right. + Inspire on left Abd-  Br/ Gen/ Rectal- Not done, not indicated Extrem- cyanosis- none, clubbing, none, atrophy- none, strength- nl Neuro- grossly intact to observatio

## 2022-05-18 ENCOUNTER — Encounter: Payer: Self-pay | Admitting: Internal Medicine

## 2022-05-18 ENCOUNTER — Ambulatory Visit: Payer: PPO | Admitting: Internal Medicine

## 2022-05-18 VITALS — BP 122/66 | HR 76 | Ht 65.0 in | Wt 196.8 lb

## 2022-05-18 DIAGNOSIS — F1010 Alcohol abuse, uncomplicated: Secondary | ICD-10-CM

## 2022-05-18 DIAGNOSIS — F5101 Primary insomnia: Secondary | ICD-10-CM

## 2022-05-18 DIAGNOSIS — G4733 Obstructive sleep apnea (adult) (pediatric): Secondary | ICD-10-CM | POA: Diagnosis not present

## 2022-05-18 DIAGNOSIS — Z77090 Contact with and (suspected) exposure to asbestos: Secondary | ICD-10-CM | POA: Diagnosis not present

## 2022-05-18 DIAGNOSIS — G4700 Insomnia, unspecified: Secondary | ICD-10-CM | POA: Insufficient documentation

## 2022-05-18 MED ORDER — TRAZODONE HCL 50 MG PO TABS: 50.0000 mg | ORAL_TABLET | Freq: Every day | ORAL | 5 refills | Status: DC

## 2022-05-18 NOTE — Assessment & Plan Note (Signed)
Not clear how much sleep disturbance by residual sleep apnea and by alcohol contribute.  Discussed sleep hygiene. Plan-trazodone

## 2022-05-18 NOTE — Patient Instructions (Addendum)
I sent script for Trazodone to try for insomnia- One or two tabs at bedtime if needed  Inspire changes made today as discussed. Build your voltage as tolerated. Certainly call if you need help.

## 2022-05-18 NOTE — Assessment & Plan Note (Signed)
Annual low-dose screening chest CT is pending

## 2022-05-18 NOTE — Assessment & Plan Note (Signed)
Polarity of his electrode leads adjusted by [employer]. Plan-he will increase voltage as tolerated.

## 2022-05-18 NOTE — Assessment & Plan Note (Signed)
He admits to drinking every day

## 2022-05-19 ENCOUNTER — Other Ambulatory Visit: Payer: Self-pay | Admitting: Cardiovascular Disease

## 2022-05-24 ENCOUNTER — Other Ambulatory Visit: Payer: Self-pay | Admitting: Acute Care

## 2022-05-24 ENCOUNTER — Encounter: Payer: Self-pay | Admitting: Internal Medicine

## 2022-05-24 DIAGNOSIS — Z87891 Personal history of nicotine dependence: Secondary | ICD-10-CM

## 2022-05-24 DIAGNOSIS — Z122 Encounter for screening for malignant neoplasm of respiratory organs: Secondary | ICD-10-CM

## 2022-05-25 NOTE — Telephone Encounter (Signed)
Mychart message sent by pt: Brandon Lopez [facility] (supporting Waymon Budge, MD)22 hours ago (5:16 PM)    Dr Maple Hudson this is Brandon Lopez . I only took 1 pill a night. The longer I took them the worst I felt in the morning. They didn't work as far as the length of sleep . They did put me to sleep just checking to see if you have something else . Thank you    Dr. Maple Hudson, please advise on this for pt.

## 2022-05-27 ENCOUNTER — Encounter: Payer: Self-pay | Admitting: Primary Care

## 2022-05-27 ENCOUNTER — Ambulatory Visit (INDEPENDENT_AMBULATORY_CARE_PROVIDER_SITE_OTHER): Payer: PPO | Admitting: Primary Care

## 2022-05-27 VITALS — BP 122/88 | HR 76 | Temp 98.0°F | Ht 65.0 in | Wt 195.1 lb

## 2022-05-27 DIAGNOSIS — F419 Anxiety disorder, unspecified: Secondary | ICD-10-CM

## 2022-05-27 DIAGNOSIS — F32A Depression, unspecified: Secondary | ICD-10-CM | POA: Diagnosis not present

## 2022-05-27 DIAGNOSIS — F1010 Alcohol abuse, uncomplicated: Secondary | ICD-10-CM

## 2022-05-27 MED ORDER — TEMAZEPAM 15 MG PO CAPS: ORAL_CAPSULE | ORAL | 0 refills | Status: DC

## 2022-05-27 MED ORDER — BUPROPION HCL ER (SR) 200 MG PO TB12: 200.0000 mg | ORAL_TABLET | Freq: Two times a day (BID) | ORAL | 0 refills | Status: DC

## 2022-05-27 NOTE — Assessment & Plan Note (Signed)
Deteriorated.  Discussed options. Also discussed the need to cut back on alcohol consumption and that antidepressants can interact. He verbalized understanding.  Increase bupropion SR to 200 mg BID.  Referral placed for therapy.   He will update in 4 weeks.

## 2022-05-27 NOTE — Progress Notes (Signed)
Subjective:    Patient ID: Layden Caterino, male    DOB: July 24, 1955, 67 y.o.   MRN: [id number]  HPI  Hosea Hanawalt is a very pleasant 67 y.o. male with a history of hypertension, paroxysmal atrial fibrillation, OSA, COPD, MDD, GAD, alcohol use disorder, hyperlipidemia who presents today to discuss depression.  Chronic history of depression. Currently managed on bupropion SR 150 mg BID and Trazodone 50 mg HS.   About one month ago he noticed a return in his symptoms which include irritability, little motivation to do things, trouble staying asleep, feeling down.    Previously managed on citalopram, Effexor, and Zoloft for which were ineffective.   Following with pulmonology for his recent Inspire device. His pulmonologist prescribed Trazodone for which he is no longer taking due to lower extremity pain and ineffectiveness. His lower extremity pain resolved upon discontinuation.   He continues to drink beer. He will drink 5-6 beers daily and more on the weekend. Previously following with therapy, has not been in years as every therapist recommends alcohol rehab. He has experienced thoughts of self harm a few times, doesn't have a plan.    Review of Systems  Cardiovascular:  Negative for chest pain and palpitations.  Psychiatric/Behavioral:  Positive for sleep disturbance. The patient is nervous/anxious.        See HPI         Past Medical History:  Diagnosis Date   Actinic keratosis    Arthritis    Chronic systolic CHF (congestive heart failure) (HCC)    a. echo 6/16: EF 55-60%, Gr1DD, LA nl, PASP nl; b. echo 7/19: EF 40-45%, unable to exclude RWMA, Gr1DD, LA nl, RVSF nl, PASP nl   COPD (chronic obstructive pulmonary disease) (HCC)    Depression    Diverticulosis    Found during colonoscopy   Edema 07/21/2016   History of kidney stones    Hypertension    Left facial swelling 12/11/2021   Left lower quadrant abdominal pain 10/27/2021   Lightheadedness  04/21/2021   PAF (paroxysmal atrial fibrillation) (HCC)    a. short episodes previously noted on PPM interrogation; b. started on Xarelto 07/2017 given further episodes of Afib noted during interrogation; c. 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 Biotronik; Henrietta Hoover DR-T [id number]; placed 08/2012   Shortness of breath 04/21/2021   Sleep apnea    does not use CPAP   Symptomatic bradycardia    a. s/p Biotronik; Henrietta Hoover DR-T [id number]; placed 08/2012 at [facility]   Syncope 08/10/2017    Social History   Socioeconomic History   Marital status: Single    Spouse name: Not on file   Number of children: Not on file   Years of education: Not on file   Highest education level: GED or equivalent  Occupational History   Not on file  Tobacco Use   Smoking status: Former    Packs/day: 2.00    Years: 50.00    Additional pack years: 0.00    Total pack years: 100.00    Types: Cigarettes    Start date: 87    Quit date: 02/01/2021    Years since quitting: 1.3   Smokeless tobacco: Never   Tobacco comments:    started smoking at age 39, 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  Vaping Use   Vaping Use: Never used  Substance and Sexual Activity  Alcohol use: Yes    Alcohol/week: 8.0 - 14.0 standard drinks of alcohol    Types: 8 - 14 Cans of beer per week    Comment: daily beer   Drug use: Yes    Types: Marijuana    Comment: states smokes daily, last 11-09-21   Sexual activity: Yes  Other Topics Concern   Not on file  Social History Narrative   Not on file   Social Determinants of Health   Financial Resource Strain: Medium Risk (05/26/2022)   Overall Financial Resource Strain (CARDIA)    Difficulty of Paying Living Expenses: Somewhat hard  Food Insecurity: Food Insecurity Present (05/26/2022)   Hunger Vital Sign    Worried About Running Out of Food in the Last Year: Sometimes true    Ran  Out of Food in the Last Year: Never true  Transportation Needs: No Transportation Needs (05/26/2022)   PRAPARE - Administrator, Civil Service (Medical): No    Lack of Transportation (Non-Medical): No  Physical Activity: Inactive (05/26/2022)   Exercise Vital Sign    Days of Exercise per Week: 0 days    Minutes of Exercise per Session: 0 min  Stress: Stress Concern Present (05/26/2022)   [employer] of Occupational Health - Occupational Stress Questionnaire    Feeling of Stress : To some extent  Social Connections: Socially Isolated (05/26/2022)   Social Connection and Isolation Panel [NHANES]    Frequency of Communication with Friends and Family: More than three times a week    Frequency of Social Gatherings with Friends and Family: More than three times a week    Attends Religious Services: Never    Database administrator or Organizations: No    Attends Banker Meetings: Never    Marital Status: Divorced  Catering manager Violence: Not At Risk (07/15/2021)   Humiliation, Afraid, Rape, and Kick questionnaire    Fear of Current or Ex-Partner: No    Emotionally Abused: No    Physically Abused: No    Sexually Abused: No    Past Surgical History:  Procedure Laterality Date   APPENDECTOMY  age 36   CARDIAC CATHETERIZATION     7 years ago   COLONOSCOPY N/A 05/27/2014   Procedure: COLONOSCOPY;  Surgeon: Wallace Cullens, MD;  Location: [facility] ENDOSCOPY;  Service: Gastroenterology;  Laterality: N/A;   DRUG INDUCED ENDOSCOPY Bilateral 11/11/2021   Procedure: DRUG INDUCED ENDOSCOPY;  Surgeon: Christia Reading, MD;  Location: [facility];  Service: ENT;  Laterality: Bilateral;   FLOOR OF MOUTH BIOPSY N/A 01/05/2022   Procedure: BIOPSY OF PALATE;  Surgeon: Christia Reading, MD;  Location: [facility];  Service: ENT;  Laterality: N/A;   IMPLANTATION OF HYPOGLOSSAL NERVE STIMULATOR Left 01/05/2022   Procedure: IMPLANTATION OF HYPOGLOSSAL NERVE STIMULATOR;   Surgeon: Christia Reading, MD;  Location:  SURGERY CENTER;  Service: ENT;  Laterality: Left;   INSERT / REPLACE / REMOVE PACEMAKER     3 years ago    Family History  Problem Relation Age of Onset   Arthritis Mother    Heart disease Father    Hyperlipidemia Father    Hypertension Father    Cancer Paternal Grandfather     Allergies  Allergen Reactions   Simvastatin Diarrhea   Lisinopril Cough    Current Outpatient Medications on File Prior to Visit  Medication Sig Dispense Refill   albuterol (VENTOLIN HFA) 108 (90 Base) MCG/ACT inhaler INHALE 2 PUFFS BY MOUTH  EVERY 6 HOURS AS NEEDED FOR WHEEZING AND FOR SHORTNESS OF BREATH 18 g 0   aspirin EC 81 MG tablet Take 81 mg by mouth daily. Swallow whole.     atorvastatin (LIPITOR) 20 MG tablet TAKE 1 TABLET BY MOUTH ONCE DAILY FOR CHOLESTEROL 90 tablet 2   Budeson-Glycopyrrol-Formoterol (BREZTRI AEROSPHERE) 160-9-4.8 MCG/ACT AERO Inhale 2 puffs into the lungs 2 (two) times daily. 10.7 g 11   carvedilol (COREG) 3.125 MG tablet Take 1 tablet by mouth twice daily 180 tablet 2   ezetimibe (ZETIA) 10 MG tablet Take 1 tablet (10 mg total) by mouth daily. 90 tablet 3   folic acid (FOLVITE) 1 MG tablet Take 1 tablet by mouth once daily 90 tablet 0   tadalafil (CIALIS) 5 MG tablet TAKE 1 TABLET BY MOUTH ONCE DAILY FOR  FREQUENT  URINATION 90 tablet 3   telmisartan (MICARDIS) 20 MG tablet Take 1 tablet by mouth once daily for blood pressure 90 tablet 3   traZODone (DESYREL) 50 MG tablet Take 1 tablet (50 mg total) by mouth at bedtime. (Patient not taking: Reported on 05/27/2022) 60 tablet 5   No current facility-administered medications on file prior to visit.    BP 122/88 (BP Location: Left Arm, Patient Position: Sitting, Cuff Size: Normal)   Pulse 76   Temp 98 F (36.7 C) (Temporal)   Ht 5\' 5"  (1.651 m)   Wt 195 lb 2 oz (88.5 kg)   SpO2 97%   BMI 32.47 kg/m  Objective:   Physical Exam Cardiovascular:     Rate and Rhythm: Normal  rate and regular rhythm.  Pulmonary:     Effort: Pulmonary effort is normal.     Breath sounds: Normal breath sounds. No wheezing or rales.  Musculoskeletal:     Cervical back: Neck supple.  Skin:    General: Skin is warm and dry.  Neurological:     Mental Status: He is alert and oriented to person, place, and time.           Assessment & Plan:  Anxiety and depression Assessment & Plan: Deteriorated.  Discussed options. Also discussed the need to cut back on alcohol consumption and that antidepressants can interact. He verbalized understanding.  Increase bupropion SR to 200 mg BID.  Referral placed for therapy.   He will update in 4 weeks.   Orders: -     buPROPion HCl ER (SR); Take 1 tablet (200 mg total) by mouth 2 (two) times daily. For depression  Dispense: 180 tablet; Refill: 0 -     Ambulatory referral to Psychology  Alcohol abuse Assessment & Plan: Strongly advised he cut back and quit.          Doreene Nest, NP

## 2022-05-27 NOTE — Assessment & Plan Note (Signed)
Strongly advised he cut back and quit.

## 2022-05-27 NOTE — Patient Instructions (Addendum)
We increased your bupropion depression medication to 200 mg twice daily.  Limit alcohol intake as discussed.   You will either be contacted via phone regarding your referral to therapy, or you may receive a letter on your MyChart portal from our referral team with instructions for scheduling an appointment. Please let us know if you have not been contacted by anyone within two weeks.  Please update me in 4 weeks as discussed.  It was a pleasure to see you today!

## 2022-05-27 NOTE — Telephone Encounter (Signed)
Trazodone discontinued Script sent to [facility] to try temazepam, 1 or 2 caps at bedtime as needed for sleep

## 2022-06-01 ENCOUNTER — Encounter: Payer: Self-pay | Admitting: Cardiology

## 2022-06-01 ENCOUNTER — Ambulatory Visit: Payer: PPO | Attending: Cardiology | Admitting: Cardiology

## 2022-06-01 VITALS — BP 140/86 | HR 72 | Ht 65.0 in | Wt 195.2 lb

## 2022-06-01 DIAGNOSIS — J449 Chronic obstructive pulmonary disease, unspecified: Secondary | ICD-10-CM

## 2022-06-01 DIAGNOSIS — I480 Paroxysmal atrial fibrillation: Secondary | ICD-10-CM

## 2022-06-01 DIAGNOSIS — G4733 Obstructive sleep apnea (adult) (pediatric): Secondary | ICD-10-CM

## 2022-06-01 DIAGNOSIS — I2510 Atherosclerotic heart disease of native coronary artery without angina pectoris: Secondary | ICD-10-CM | POA: Diagnosis not present

## 2022-06-01 DIAGNOSIS — I10 Essential (primary) hypertension: Secondary | ICD-10-CM

## 2022-06-01 DIAGNOSIS — Z950 Presence of cardiac pacemaker: Secondary | ICD-10-CM | POA: Diagnosis not present

## 2022-06-01 DIAGNOSIS — I2584 Coronary atherosclerosis due to calcified coronary lesion: Secondary | ICD-10-CM

## 2022-06-01 DIAGNOSIS — I495 Sick sinus syndrome: Secondary | ICD-10-CM | POA: Diagnosis not present

## 2022-06-01 DIAGNOSIS — E782 Mixed hyperlipidemia: Secondary | ICD-10-CM

## 2022-06-01 NOTE — Progress Notes (Signed)
Cardiology Office Note:   Date:  06/01/2022  ID:  Brandon Lopez, DOB [birth year], MRN [id number]  History of Present Illness:   Brandon Lopez is a 67 y.o. male with past medical history of nonobstructive coronary disease, paroxysmal atrial fibrillation, symptomatic bradycardia status post PPM, chronic diastolic heart failure, hypertension, orthostatic lightheadedness, COPD, tobacco use, and OSA, who presents today for follow-up of his nonobstructive CAD and paroxysmal atrial fibrillation.  Was last seen in clinic 05/13/2022 by S. Riddle, NP at that time he was doing well his lightheadedness had improved.  He denied palpitations.  Able to sleep flat with a good appetite.  Has resumed smoking after increased incidence of stress.  Also continues to drink about 2 cases of beer per week and was not interested in reducing consumption at that time.  There were no medication changes that were made at that time.  No further testing that was ordered.  He also had his device interrogated his device was functioning well.  He returns to clinic today stating that he has been doing fairly well.  He denies any chest pain, shortness of breath, peripheral edema, and only has occasional palpitations.  He is concerned today as he has noted slight increase in his blood pressures and is unsure as to why.  He stated he was recently started on a medication to help with his sleep and was curious if secondary causes for the issues that he has been having.  He also endorses dietary changes with increased sodium intake.  He states that he has continued to smoke primarily from the hours of 4-10 and continues to drink 2 cases of beer per week.  He says this is unchanged.  He also has been suffering from increased depression and has been placed on medication from his PCP.  He states he has an upcoming appointment with a counselor as well.  He says he has done that in the past and had nerve block he says he is looking for  coping mechanisms and not someone wants to send him to rehab.  He denies any hospitalizations or visits to the emergency department.  ROS: 10 point review of system has been completed and is considered negative with the exception of what is been listed in the HPI  Studies Reviewed:    EKG: Atrially paced , rate of 72, prolonged PR interval, unchanged from prior study  TTE, 04/16/2021  1. Left ventricular ejection fraction, by estimation, is 60 to 65%. The left ventricle has normal function. The left ventricle has no regional wall motion abnormalities. Left ventricular diastolic parameters are consistent with Grade I diastolic dysfunction (impaired relaxation).   2. Right ventricular systolic function is normal. The right ventricular size is normal. There is normal pulmonary artery systolic pressure. The estimated right ventricular systolic pressure is 21.6 mmHg.   3. The mitral valve is normal in structure. Mild mitral valve regurgitation. No evidence of mitral stenosis.   4. The aortic valve was not well visualized. Aortic valve regurgitation is not visualized. No aortic stenosis is present.   5. The inferior vena cava is normal in size with greater than 50% respiratory variability, suggesting right atrial pressure of 3 mmHg.    NM Myocardial spect, 08/26/2016 Pharmacological myocardial perfusion imaging study with no significant  Ischemia Small region mild fixed defect apical wall on attenuation corrected images, unable to exclude processing air No apical defect noted on non-attenuation corrected images Non-attenuation corrected images with diaphragmatic attenuation/mild fixed decreased inferior wall  perfusion. Defect resolved with attenuation correction Normal wall motion, EF estimated at 57% No EKG changes concerning for ischemia at peak stress or in recovery. Low risk scan   TTE, 08/11/2017 - Left ventricle: The cavity size was normal. Systolic function was mildly to moderately reduced. The  estimated ejection fraction was in the range of 40% to 45%. Regional wall motion abnormalities cannot be excluded. Doppler parameters are consistent with abnormal left ventricular relaxation (grade 1 diastolic dysfunction).  - Left atrium: The atrium was normal in size.  - Right ventricle: Pacer wire or catheter noted in right ventricle. Systolic function was normal.  - Pulmonary arteries: Systolic pressure was within the normal range.   Risk Assessment/Calculations:         HYPERTENSION CONTROL Vitals:   06/01/22 0956 06/01/22 1020  BP: (!) 148/94 (!) 140/86    The patient's blood pressure is elevated above target today.  In order to address the patient's elevated BP: Blood pressure will be monitored at home to determine if medication changes need to be made.           Physical Exam:   VS:  BP (!) 140/86 (BP Location: Left Arm, Patient Position: Sitting, Cuff Size: Normal)   Pulse 72   Ht 5\' 5"  (1.651 m)   Wt 195 lb 3.2 oz (88.5 kg)   SpO2 95%   BMI 32.48 kg/m    Wt Readings from Last 3 Encounters:  06/01/22 195 lb 3.2 oz (88.5 kg)  05/27/22 195 lb 2 oz (88.5 kg)  05/18/22 196 lb 12.8 oz (89.3 kg)     GEN: Well nourished, well developed in no acute distress NECK: No JVD; No carotid bruits CARDIAC: RRR, no murmurs, rubs, gallops RESPIRATORY:  Clear to auscultation without rales, wheezing or rhonchi  ABDOMEN: Soft, non-tender, non-distended EXTREMITIES:  No edema; No deformity   ASSESSMENT AND PLAN:   Aortic and coronary calcification noted on CT scan.  Nonobstructive coronary artery disease without symptoms of angina.  She is continued on Lipitor 20 mg daily, Zetia 10 mg daily, and aspirin 81 mg daily.  He has been advised on smoking and ETOH cessation for secondary prevention.  No ischemic changes noted on EKG.  Essential hypertension with blood pressure today 148/94.  He has been continued on carvedilol 3.25 mg twice daily and telmisartan 20 mg daily.  Patient has been  encouraged to decrease his sodium intake in his diet as this morning prior to his appointment on the day he has a ham, egg, and cheese biscuit.  He has been encouraged to monitor his blood pressure 1 to 2 hours after medication and keep a log to bring with his return appointment.  COPD with chronic shortness of breath that is unchanged.  100-pack-year quit smoking January 2023 who has restarted.  Obstructive sleep apnea with recent inspire hypoglossal nerve stimulator placed 01/05/2022 and activation on 02/09/2022.  This continues to be followed by pulmonary.  Paroxysmal atrial fibrillation maintaining paced rhythm on EKG today.  He is currently on anticoagulation.  CHA2DS2-VASc score of 1.  He is continued on aspirin 81 mg daily.  Mixed hyperlipidemia LDL 51.8 in 01/12/2022.  He is continued on atorvastatin and zetia.  Sinus node dysfunction with permanent pacemaker and tach. A-paced noted on EKG appears to be functioning appropriately.  Recently was seen by EP without changes made.  Device continues to be followed by EP Dr. Graciela Husbands.  EtOH abuse and tobacco abuse without.  Patient has no desire to  currently quit at this time.  Total cessation is recommended.  Disposition patient return to clinic to see MD/APP in 3 months or sooner if needed.        Signed, Montez Stryker, NP

## 2022-06-01 NOTE — Patient Instructions (Signed)
Medication Instructions:  No changes at this time.   *If you need a refill on your cardiac medications before your next appointment, please call your pharmacy*   Lab Work: None  If you have labs (blood work) drawn today and your tests are completely normal, you will receive your results only by: MyChart Message (if you have MyChart) OR A paper copy in the mail If you have any lab test that is abnormal or we need to change your treatment, we will call you to review the results.   Testing/Procedures: None   Follow-Up: At [facility], you and your health needs are our priority.  As part of our continuing mission to provide you with exceptional heart care, we have created designated Provider Care Teams.  These Care Teams include your primary Cardiologist (physician) and Advanced Practice Providers (APPs -  Physician Assistants and Nurse Practitioners) who all work together to provide you with the care you need, when you need it.    Your next appointment:   3 month(s)  Provider:   Julien Nordmann, MD or Charlsie Quest, NP

## 2022-06-02 DIAGNOSIS — J441 Chronic obstructive pulmonary disease with (acute) exacerbation: Secondary | ICD-10-CM | POA: Diagnosis not present

## 2022-06-08 ENCOUNTER — Ambulatory Visit (INDEPENDENT_AMBULATORY_CARE_PROVIDER_SITE_OTHER): Payer: PPO

## 2022-06-08 DIAGNOSIS — I495 Sick sinus syndrome: Secondary | ICD-10-CM

## 2022-06-08 LAB — CUP PACEART REMOTE DEVICE CHECK
Date Time Interrogation Session: 20240514074119
Implantable Lead Connection Status: 753985
Implantable Lead Connection Status: 753985
Implantable Lead Implant Date: 20140801
Implantable Lead Implant Date: 20140801
Implantable Lead Location: 753859
Implantable Lead Location: 753860
Implantable Lead Model: 362
Implantable Lead Serial Number: 29356730
Implantable Lead Serial Number: 29392661
Implantable Pulse Generator Implant Date: 20140801
Pulse Gen Serial Number: 68053398

## 2022-06-22 ENCOUNTER — Ambulatory Visit
Admission: RE | Admit: 2022-06-22 | Discharge: 2022-06-22 | Disposition: A | Discharge status: Home / Self Care | Payer: PPO | Source: Ambulatory Visit | Attending: Acute Care | Admitting: Acute Care

## 2022-06-22 DIAGNOSIS — Z122 Encounter for screening for malignant neoplasm of respiratory organs: Secondary | ICD-10-CM | POA: Diagnosis not present

## 2022-06-22 DIAGNOSIS — Z87891 Personal history of nicotine dependence: Secondary | ICD-10-CM | POA: Insufficient documentation

## 2022-06-25 NOTE — Progress Notes (Signed)
Remote pacemaker transmission.   

## 2022-06-28 ENCOUNTER — Other Ambulatory Visit: Payer: Self-pay | Admitting: Acute Care

## 2022-06-28 DIAGNOSIS — Z87891 Personal history of nicotine dependence: Secondary | ICD-10-CM

## 2022-06-28 DIAGNOSIS — Z122 Encounter for screening for malignant neoplasm of respiratory organs: Secondary | ICD-10-CM

## 2022-07-02 ENCOUNTER — Telehealth: Payer: Self-pay

## 2022-07-02 NOTE — Progress Notes (Unsigned)
Care Management & Coordination Services Pharmacy Team  Reason for Encounter: COPD  Contacted patient to discuss COPD disease state. {US HC Outreach:28874}  Current COPD regimen: ***     No data to display         Any recent hospitalizations or ED visits since last visit with CPP? {yes/no:20286} Reports***denies COPD symptoms, including {COPD Symptoms:24140} What recent interventions/DTPs have been made by any provider to improve breathing since last visit: Have you had exacerbation/flare-up since last visit? {yes/no:20286} What do you do when you are short of breath?  {SOBresposne:24139}***  Respiratory Devices/Equipment Do you have a nebulizer? {yes/no:20286} Do you use a Peak Flow Meter? {yes/no:20286} Do you use a maintenance inhaler? {yes/no:20286} How often do you forget to use your daily inhaler? *** Do you use a rescue inhaler? {yes/no:20286} How often do you use your rescue inhaler?  {CHL HP [employer] Inhaler NWGN:[id number]} Do you use a spacer with your inhaler? {yes/no:20286}  Adherence Review: Does the patient have >5 day gap between last estimated fill date for maintenance inhaler medications? {yes/no:20286}   Chart Review:  Recent office visits:  05/27/22-Katherine Clark,NP(PCP)- f/u chronic,depression, discussed the need to cut back on alcohol consumption and that antidepressants can interact. He verbalized understanding. Increase bupropion SR to 200 mg BID.  Referral placed for therapy. Update in 4 weeks  Recent consult visits:  06/01/22-Sheri Hammock,NP(cardio)f/u CAD,Afib, resumed smoking after increased incidence of stress. Advised to decrease sodium intake,no medication changes,f/u 3 months   Hospital visits:  {Hospital DC Yes/No:[id number]}  Medications: Outpatient Encounter Medications as of 07/02/2022  Medication Sig   albuterol (VENTOLIN HFA) 108 (90 Base) MCG/ACT inhaler INHALE 2 PUFFS BY MOUTH EVERY 6 HOURS AS NEEDED FOR WHEEZING AND FOR  SHORTNESS OF BREATH   aspirin EC 81 MG tablet Take 81 mg by mouth daily. Swallow whole.   atorvastatin (LIPITOR) 20 MG tablet TAKE 1 TABLET BY MOUTH ONCE DAILY FOR CHOLESTEROL   Budeson-Glycopyrrol-Formoterol (BREZTRI AEROSPHERE) 160-9-4.8 MCG/ACT AERO Inhale 2 puffs into the lungs 2 (two) times daily.   buPROPion (WELLBUTRIN SR) 200 MG 12 hr tablet Take 1 tablet (200 mg total) by mouth 2 (two) times daily. For depression   carvedilol (COREG) 3.125 MG tablet Take 1 tablet by mouth twice daily   ezetimibe (ZETIA) 10 MG tablet Take 1 tablet (10 mg total) by mouth daily.   folic acid (FOLVITE) 1 MG tablet Take 1 tablet by mouth once daily (Patient not taking: Reported on 06/01/2022)   tadalafil (CIALIS) 5 MG tablet TAKE 1 TABLET BY MOUTH ONCE DAILY FOR  FREQUENT  URINATION   telmisartan (MICARDIS) 20 MG tablet Take 1 tablet by mouth once daily for blood pressure   temazepam (RESTORIL) 15 MG capsule 1 or 2 caps at bedtime for sleep   No facility-administered encounter medications on file as of 07/02/2022.     Al Corpus, PharmD notified  Burt Knack, [facility] Clinical Pharmacy Assistant [phone]

## 2022-07-15 ENCOUNTER — Ambulatory Visit (INDEPENDENT_AMBULATORY_CARE_PROVIDER_SITE_OTHER): Payer: PPO | Admitting: Clinical

## 2022-07-15 DIAGNOSIS — F331 Major depressive disorder, recurrent, moderate: Secondary | ICD-10-CM

## 2022-07-15 NOTE — Progress Notes (Signed)
                Samanthan Dugo, LCSW 
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
## 2022-07-16 ENCOUNTER — Ambulatory Visit (INDEPENDENT_AMBULATORY_CARE_PROVIDER_SITE_OTHER): Payer: PPO | Admitting: Primary Care

## 2022-07-16 ENCOUNTER — Encounter: Payer: Self-pay | Admitting: Primary Care

## 2022-07-16 VITALS — BP 112/64 | HR 70 | Temp 97.9°F | Ht 65.0 in | Wt 195.0 lb

## 2022-07-16 DIAGNOSIS — D692 Other nonthrombocytopenic purpura: Secondary | ICD-10-CM | POA: Insufficient documentation

## 2022-07-16 DIAGNOSIS — H5789 Other specified disorders of eye and adnexa: Secondary | ICD-10-CM | POA: Diagnosis not present

## 2022-07-16 DIAGNOSIS — J449 Chronic obstructive pulmonary disease, unspecified: Secondary | ICD-10-CM

## 2022-07-16 NOTE — Patient Instructions (Signed)
Please contact me once you have decided on an optometrist.  Wear sunscreen when you are outdoors. Alcohol will thin the blood.  It was a pleasure to see you today!

## 2022-07-16 NOTE — Assessment & Plan Note (Addendum)
Noted to bilateral upper extremities without complication.   Reviewed CBC and CMP from October 2023 and prior years, no low platelets or elevated LFT's.  Reviewed CT abdomen/pelvis from October 2023 and there is no mention of cirrhosis or or lesions to the liver.  We did discuss alcohol as a blood thinner, especially when combined with aspirin. Recommended he stop drinking alcohol.  Recommended to start wearing sunscreen outdoors.   Continue aspriin 81 mg daily. He will discuss with cardiology.

## 2022-07-16 NOTE — Assessment & Plan Note (Signed)
Stable overall.  Agree to renew handicap placard.

## 2022-07-16 NOTE — Assessment & Plan Note (Signed)
Will place referral to optometry. He will first call around to see who he prefers to see and will notify us of this decision. Will place referral once we have spoke with him again.  No obvious infection or foreign body on exam.

## 2022-07-16 NOTE — Progress Notes (Signed)
Subjective:    Patient ID: Brandon Lopez, male    DOB: 02/18/1955, 67 y.o.   MRN: [id number]  Eye Problem     Jhordan Mckibben is a very pleasant 67 y.o. male with a history of hypertension, paroxysmal atrial fibrillation, COPD, OSA, osteoarthritis, degenerative disc disease of lumbar spine, CAD, pacemaker in place who presents today to discuss multiple concerns.  1) Eye Irritation: He's noticed his eye lashes move towards inward to his nose which are hitting his eyes. This began years ago but worse over the last few weeks. He's been using Visine eye drops OTC with temporary improvement.   His last eye exam was about 1.5 years ago but he would like a new eye doctor.   2)Senile Purpura: Chronic for years, but he feels that his skin is getting thinner and he's bruising more easily. He feels that each time he touches his skin or bumps against an object he is bruised.   Currently managed on aspirin 81 mg for coronary artery disease and coronary artery calcifications.  Also managed on atorvastatin 20 mg daily and Zetia 10 mg daily.  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 He works outside very often during warmer months and does not use sunscreen. He continues to drink 4-6 beers everyday throughout the day. He denies unexplained weight loss, history of low platelets.   He denies bruising elsewhere on his body, only his upper extremities.   Wt Readings from Last 3 Encounters:  07/16/22 195 lb (88.5 kg)  06/01/22 195 lb 3.2 oz (88.5 kg)  05/27/22 195 lb 2 oz (88.5 kg)     3) Handicap Placard Request: He is mostly requesting a handicap placard due to his COPD and osteoarthritis. He experiences worsening shortness of breath in the Summer months.   He has a handicap placard which expires in August 2024. He is needing his renewed.    BP Readings from Last 3 Encounters:  07/16/22 112/64  06/01/22 (!) 140/86  05/27/22 122/88     Review  of Systems  Constitutional:  Negative for unexpected weight change.  Eyes:        Eye irritation   Cardiovascular:  Negative for chest pain.  Hematological:  Negative for adenopathy. Bruises/bleeds easily.         Past Medical History:  Diagnosis Date   Actinic keratosis    Arthritis    Chronic systolic CHF (congestive heart failure) (HCC)    a. echo 6/16: EF 55-60%, Gr1DD, LA nl, PASP nl; b. echo 7/19: EF 40-45%, unable to exclude RWMA, Gr1DD, LA nl, RVSF nl, PASP nl   COPD (chronic obstructive pulmonary disease) (HCC)    Depression    Diverticulosis    Found during colonoscopy   Edema 07/21/2016   History of kidney stones    Hypertension    Left facial swelling 12/11/2021   Left lower quadrant abdominal pain 10/27/2021   Lightheadedness 04/21/2021   PAF (paroxysmal atrial fibrillation) (HCC)    a. short episodes previously noted on PPM interrogation; b. started on Xarelto 07/2017 given further episodes of Afib noted during interrogation; c. 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 Biotronik; Henrietta Hoover DR-T [id number]; placed 08/2012   Shortness of breath 04/21/2021   Sleep apnea    does not use CPAP   Symptomatic bradycardia  a. s/p Biotronik; Henrietta Hoover DR-T [id number]; placed 08/2012 at [facility]   Syncope 08/10/2017    Social History   Socioeconomic History   Marital status: Single    Spouse name: Not on file   Number of children: Not on file   Years of education: Not on file   Highest education level: GED or equivalent  Occupational History   Not on file  Tobacco Use   Smoking status: Former    Packs/day: 2.00    Years: 50.00    Additional pack years: 0.00    Total pack years: 100.00    Types: Cigarettes    Start date: 37    Quit date: 02/01/2021    Years since quitting: 1.4   Smokeless tobacco: Never   Tobacco comments:    started smoking at age 63,  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  Vaping Use   Vaping Use: Never used  Substance and Sexual Activity   Alcohol use: Yes    Alcohol/week: 8.0 - 14.0 standard drinks of alcohol    Types: 8 - 14 Cans of beer per week    Comment: daily beer   Drug use: Yes    Types: Marijuana    Comment: states smokes daily, last 11-09-21   Sexual activity: Yes  Other Topics Concern   Not on file  Social History Narrative   Not on file   Social Determinants of Health   Financial Resource Strain: Medium Risk (05/26/2022)   Overall Financial Resource Strain (CARDIA)    Difficulty of Paying Living Expenses: Somewhat hard  Food Insecurity: Food Insecurity Present (05/26/2022)   Hunger Vital Sign    Worried About Running Out of Food in the Last Year: Sometimes true    Ran Out of Food in the Last Year: Never true  Transportation Needs: No Transportation Needs (05/26/2022)   PRAPARE - Administrator, Civil Service (Medical): No    Lack of Transportation (Non-Medical): No  Physical Activity: Inactive (05/26/2022)   Exercise Vital Sign    Days of Exercise per Week: 0 days    Minutes of Exercise per Session: 0 min  Stress: Stress Concern Present (05/26/2022)   [employer] of Occupational Health - Occupational Stress Questionnaire    Feeling of Stress : To some extent  Social Connections: Socially Isolated (05/26/2022)   Social Connection and Isolation Panel [NHANES]    Frequency of Communication with Friends and Family: More than three times a week    Frequency of Social Gatherings with Friends and Family: More than three times a week    Attends Religious Services: Never    Database administrator or Organizations: No    Attends Engineer, structural: Not on file    Marital Status: Divorced  Intimate Partner Violence: Not At Risk (07/15/2021)   Humiliation, Afraid, Rape, and Kick questionnaire    Fear of Current or Ex-Partner: No    Emotionally Abused: No    Physically Abused: No     Sexually Abused: No    Past Surgical History:  Procedure Laterality Date   APPENDECTOMY  age 74   CARDIAC CATHETERIZATION     7 years ago   COLONOSCOPY N/A 05/27/2014   Procedure: COLONOSCOPY;  Surgeon: Wallace Cullens, MD;  Location: [facility] END[facility]COPY;  Service: Gastroenterology;  Laterality: N/A;   DRUG INDUCED ENDOSCOPY Bilateral 11/11/2021   Procedure: DRUG INDUCED ENDOSCOPY;  Surgeon: Christia Reading, MD;  Location: [facility];  Service: ENT;  Laterality: Bilateral;  FLOOR OF MOUTH BIOPSY N/A 01/05/2022   Procedure: BIOPSY OF PALATE;  Surgeon: Christia Reading, MD;  Location: [facility];  Service: ENT;  Laterality: N/A;   IMPLANTATION OF HYPOGLOSSAL NERVE STIMULATOR Left 01/05/2022   Procedure: IMPLANTATION OF HYPOGLOSSAL NERVE STIMULATOR;  Surgeon: Christia Reading, MD;  Location: [facility];  Service: ENT;  Laterality: Left;   INSERT / REPLACE / REMOVE PACEMAKER     3 years ago    Family History  Problem Relation Age of Onset   Arthritis Mother    Heart disease Father    Hyperlipidemia Father    Hypertension Father    Cancer Paternal Grandfather     Allergies  Allergen Reactions   Simvastatin Diarrhea   Lisinopril Cough    Current Outpatient Medications on File Prior to Visit  Medication Sig Dispense Refill   albuterol (VENTOLIN HFA) 108 (90 Base) MCG/ACT inhaler INHALE 2 PUFFS BY MOUTH EVERY 6 HOURS AS NEEDED FOR WHEEZING AND FOR SHORTNESS OF BREATH 18 g 0   aspirin EC 81 MG tablet Take 81 mg by mouth daily. Swallow whole.     atorvastatin (LIPITOR) 20 MG tablet TAKE 1 TABLET BY MOUTH ONCE DAILY FOR CHOLESTEROL 90 tablet 2   Budeson-Glycopyrrol-Formoterol (BREZTRI AEROSPHERE) 160-9-4.8 MCG/ACT AERO Inhale 2 puffs into the lungs 2 (two) times daily. 10.7 g 11   buPROPion (WELLBUTRIN SR) 150 MG 12 hr tablet Take 150 mg by mouth 2 (two) times daily.     carvedilol (COREG) 3.125 MG tablet Take 1 tablet by mouth twice daily 180 tablet 2    ezetimibe (ZETIA) 10 MG tablet Take 1 tablet (10 mg total) by mouth daily. 90 tablet 3   telmisartan (MICARDIS) 20 MG tablet Take 1 tablet by mouth once daily for blood pressure 90 tablet 3   temazepam (RESTORIL) 15 MG capsule 1 or 2 caps at bedtime for sleep 30 capsule 0   folic acid (FOLVITE) 1 MG tablet Take 1 tablet by mouth once daily (Patient not taking: Reported on 06/01/2022) 90 tablet 0   tadalafil (CIALIS) 5 MG tablet TAKE 1 TABLET BY MOUTH ONCE DAILY FOR  FREQUENT  URINATION 90 tablet 3   No current facility-administered medications on file prior to visit.    BP 112/64   Pulse 70   Temp 97.9 F (36.6 C) (Temporal)   Ht 5\' 5"  (1.651 m)   Wt 195 lb (88.5 kg)   SpO2 95%   BMI 32.45 kg/m  Objective:   Physical Exam Eyes:     Conjunctiva/sclera:     Right eye: Right conjunctiva is injected. No exudate.    Left eye: Left conjunctiva is injected. No exudate.    Comments: Upper eyelashes bilaterally face downward towards eyeball.  Cardiovascular:     Rate and Rhythm: Normal rate and regular rhythm.  Pulmonary:     Effort: Pulmonary effort is normal.     Breath sounds: Normal breath sounds.  Skin:    General: Skin is warm and dry.     Comments: Senile purpura noted to bilateral upper extremities without complications.  No bruising noted to lower extremities, abdomen, trunk.           Assessment & Plan:  Senile purpura (HCC) Assessment & Plan: Noted to bilateral upper extremities without complication.   Reviewed CBC and CMP from October 2023 and prior years, no low platelets or elevated LFT's.  Reviewed CT abdomen/pelvis from October 2023 and there is no mention of cirrhosis  or or lesions to the liver.  We did discuss alcohol as a blood thinner, especially when combined with aspirin. Recommended he stop drinking alcohol.  Recommended to start wearing sunscreen outdoors.   Continue aspriin 81 mg daily. He will discuss with cardiology.    COPD mixed type  [facility]) Assessment & Plan: Stable overall.  Agree to renew handicap placard.   Eye irritation Assessment & Plan: Will place referral to optometry. He will first call around to see who he prefers to see and will notify us of this decision. Will place referral once we have spoke with him again.  No obvious infection or foreign body on exam.         Doreene Nest, NP

## 2022-07-17 NOTE — Progress Notes (Signed)
HPI M former smoker followed for OSA, COPD (100 pkyrs, QS [id number]), Asbestos exposure, complicated by CHF/Pacemakert,, HTN, PAFib, COPD, HxTobacco Use, OSA, Diverticulosis, Osteoarthritis, ETOH, Anxiety/Depression, Hx Positive TB PPD,  -Ventolin hfa, Breztri,  NPSG [place] 09/10/15- AHI 44.3/ hr, desaturation to 83%, body weight 184 lbs, CPAP to 10 Inspire Hypoglossal Nerve Stimulator 01/05/22-Dr Genevie Cheshire Activation 02/09/22  ======================================================================================    05/18/22-  66 yoM former smoker followed for OSA, COPD (100 pkyrs, QS [id number]), Asbestos exposure, complicated by CHF/Pacemakert,, HTN, PAFib, COPD, HxTobacco Use, OSA, Diverticulosis, Osteoarthritis, ETOH, Anxiety/Depression, Hx Positive TB PPD,  He had installed and removed asbestos and he had worked in [employer] in addition to a long smoking history. -Ventolin hfa, Breztri,  CT chest low dose screen/ Asbestos exposure-  pending in May    Inspire Hypoglossal Nerve Stimulator placed 01/05/22-Dr Genevie Cheshire Activation 02/09/22 Inspire Sleep  Study Tiration-05/18/22- - The patient was titrated to a voltage of 1.8 in time available. At initial voltage of 0, AHI (4%) was 57.6/ hr with minimum O2 saturation 85%/ mean 91.7%. - At voltage 1.8, AHI (4%) was 22.5/ hr with minimum O2 saturation 88% / mean 91.8%. - Central sleep apnea was not noted during this titration (CAI = 1/h). - Patient snored with loud snoring volume during this titration study. - 2-lead EKG demonstrated: PVCs - Clinically significant periodic limb movements were not noted during this study. Arousals associated with PLMs were rare. - Restroom x 3 ( Inspire off). - Patient was discharged with Inspire voltage set at 1.1. Body weight today196 lbs   Complains of waking still every 2 hours. Melatonin caused malaise. We discussed and will try trazodone for insomnia. Sleep study results were reviewed.  Inspire  technician worked with him at adjusted the signals into his hypoglossal nerve to a unipolar arrangement.  He will rebuild voltage as tolerated try for better control.  07/20/22- 66 yoM former smoker followed for OSA, COPD (100 pkyrs, QS [id number]), Asbestos exposure, complicated by CHF/Pacemakert,, HTN, PAFib, CAD, HxTobacco Use, OSA, Diverticulosis, Osteoarthritis, ETOH, Anxiety/Depression, Hx Positive TB PPD,  He had installed and removed asbestos and he had worked in [employer] in addition to a long smoking history. -Ventolin hfa, Breztri,  CT chest low dose screen/ Asbestos exposure-   Inspire Hypoglossal Nerve Stimulator placed 01/05/22-Dr Genevie Cheshire Activation 02/09/22 Inspire Sleep  Study Titration-05/18/22-   Download- usage 97% of nights nights> 4 hours, avg 7 hrs 25 min/ night amplitude 0.4, avg pauses// night = 1.5 He doesn't find either temazepam or trazodone helpful. Sleep fragmented by nonspecific arousals still. Recently feeling less alert/ less "ready to go" on waking in AM despite Inspire. Not sure what has changed. He flet better with settings 2 months ago>> we agreed to go back to that. He agrees to try Lunesta 3 mg to help with sleep consolidation. He says breathing is "fine and denies routine cough or wheeze.       //Consider future PFT??// CT chest low dose screen 06/26/22- IMPRESSION: 1. Lung-RADS 1S, negative. Continue annual screening with low-dose chest CT without contrast in 12 months. 2. The "S" modifier above refers to potentially clinically significant non lung cancer related findings. Specifically, there is aortic atherosclerosis, in addition to left main and three-vessel coronary artery disease. Please note that although the presence of coronary artery calcium documents the presence of coronary artery disease, the severity of this disease and any potential stenosis cannot be assessed on this non-gated CT examination. Assessment for potential risk factor  modification, dietary therapy or pharmacologic therapy may be warranted, if clinically indicated. 3. Mild diffuse bronchial wall thickening with severe centrilobular and paraseptal emphysema; imaging findings suggestive of underlying COPD. Aortic Atherosclerosis (ICD10-I70.0) and Emphysema (ICD10-J43.9).  ROS-see HPI   + = positive Constitutional:    weight loss, night sweats, fevers, chills, fatigue, lassitude. HEENT:    headaches, difficulty swallowing, tooth/dental problems, sore throat,       sneezing, itching, ear ache, nasal congestion, post nasal drip, snoring CV:    chest pain, orthopnea, PND, swelling in lower extremities, anasarca,                                   dizziness, palpitations Resp:   +shortness of breath with exertion or at rest.                productive cough,   non-productive cough, coughing up of blood.              change in color of mucus.  wheezing.   Skin:    rash or lesions. GI:  +heartburn, indigestion, abdominal pain, nausea, vomiting, diarrhea,                 change in bowel habits, +loss of appetite GU: dysuria, change in color of urine, no urgency or frequency.   flank pain. MS:   joint pain, stiffness, decreased range of motion, back pain. Neuro-     nothing unusual Psych:  change in mood or affect.  depression or anxiety.   memory loss.  OBJ- Physical Exam   +overweight General- Alert, Oriented, Affect-appropriate, Distress- none Skin- rash-none, lesions- none, excoriation- none Lymphadenopathy- none Head- atraumatic            Eyes- Gross vision intact, PERRLA, conjunctivae and secretions clear            Ears- Hearing, canals-normal            Nose- Clear, no-Septal dev, mucus, polyps, erosion, perforation             Throat- Mallampati II , mucosa clear , drainage- none, tonsils- atrophic Neck- flexible , trachea midline, no stridor , thyroid nl, carotid no bruit Chest - symmetrical excursion , unlabored           Heart/CV- RRR , no murmur  , no gallop  , no rub, nl s1 s2                           - JVD- none , edema- none, stasis changes- none, varices- none           Lung- + clear/ no rhonchi, no distinct crackle,  wheeze- none, cough , dullness-none, rub- none           Chest wall- +pacemaker on right. + Inspire on left Abd-  Br/ Gen/ Rectal- Not done, not indicated Extrem- cyanosis- none, clubbing, none, atrophy- none, strength- nl Neuro- grossly intact to observatio

## 2022-07-20 ENCOUNTER — Encounter: Payer: Self-pay | Admitting: Internal Medicine

## 2022-07-20 ENCOUNTER — Ambulatory Visit: Payer: PPO | Admitting: Internal Medicine

## 2022-07-20 VITALS — BP 122/80 | HR 80 | Ht 65.0 in | Wt 196.2 lb

## 2022-07-20 DIAGNOSIS — Z77090 Contact with and (suspected) exposure to asbestos: Secondary | ICD-10-CM | POA: Diagnosis not present

## 2022-07-20 DIAGNOSIS — F5101 Primary insomnia: Secondary | ICD-10-CM

## 2022-07-20 DIAGNOSIS — F1010 Alcohol abuse, uncomplicated: Secondary | ICD-10-CM

## 2022-07-20 DIAGNOSIS — G4733 Obstructive sleep apnea (adult) (pediatric): Secondary | ICD-10-CM | POA: Diagnosis not present

## 2022-07-20 MED ORDER — ESZOPICLONE 3 MG PO TABS: 3.0000 mg | ORAL_TABLET | Freq: Every day | ORAL | 0 refills | Status: DC

## 2022-07-20 NOTE — Patient Instructions (Signed)
Script sent to try Lunesta 3 mg at bedtime for sleep. See if it helps you sleep with fewer awakenings during the night.  Adjust as tolerated up to level 5. See if you can tell what level you sleep best.

## 2022-07-21 ENCOUNTER — Encounter: Payer: Self-pay | Admitting: Internal Medicine

## 2022-07-21 NOTE — Telephone Encounter (Signed)
Ok to change back to where you are comfortable. If you find that you can increase very slowly that would be great, but you need to feel comfortable.

## 2022-07-21 NOTE — Telephone Encounter (Signed)
PT sent [facility] message about issues he is having with the inspire. He was in to see Dr. Jeannie Fend and they made some changes and he had a horrible time sleeping last night as you can see in his message.  Please call PT to advise @ [phone]

## 2022-07-22 NOTE — Telephone Encounter (Signed)
When he cuts it on there is usually a waiting period and it was switched from 10 mins to 30 mins but he doesn't know how to do it

## 2022-07-23 NOTE — Telephone Encounter (Signed)
I read notes from Dr. Maple Hudson. PT states this is not what he needs.He states if he does not hear back from [place] this eve he is changing Dr's because it is like he is talking to deaf ears. He states he told this to the nurse also, yesterday.  Best CB is [phone]  States that when he goes to bed it is set to 10 minutes before it goes on. He needs it reset to 30 min. Please call ASAP  He is frustrated with no responce to the messages the past two days, he says.

## 2022-07-23 NOTE — Telephone Encounter (Signed)
LM on pt's VM to return my call.

## 2022-07-23 NOTE — Telephone Encounter (Signed)
PT calling again. I saw the triage note to [facility] and I am seeing if she is avail.

## 2022-07-23 NOTE — Telephone Encounter (Signed)
Pt is coming in on Monday, 7/1 at 12 noon to adjust his start time to 30 minutes instead of 10.  Pt aware to use the pause function to help give him additional time until Monday.  Nothing further needed at this time.

## 2022-07-26 NOTE — Telephone Encounter (Signed)
Pt seen today & start time delay has been adjusted to 20 minutes by Thayer Ohm from [place].

## 2022-07-28 ENCOUNTER — Ambulatory Visit (INDEPENDENT_AMBULATORY_CARE_PROVIDER_SITE_OTHER): Payer: PPO

## 2022-07-28 VITALS — Ht 65.0 in | Wt 195.0 lb

## 2022-07-28 DIAGNOSIS — Z0000 Encounter for general adult medical examination without abnormal findings: Secondary | ICD-10-CM

## 2022-07-28 NOTE — Progress Notes (Signed)
Subjective:   Brandon Lopez is a 67 y.o. male who presents for Medicare Annual/Subsequent preventive examination.  Visit Complete: Virtual  I connected with  Sharmaine Base Vonbehren on 07/28/22 by a audio enabled telemedicine application and verified that I am speaking with the correct person using two identifiers.  Patient Location: Home  Provider Location: Home Office  I discussed the limitations of evaluation and management by telemedicine. The patient expressed understanding and agreed to proceed.  Patient Medicare AWV questionnaire was completed by the patient on 07/28/2022; I have confirmed that all information answered by patient is correct and no changes since this date.  Review of Systems     Cardiac Risk Factors include: advanced age (>25men, >66 women);male gender;dyslipidemia;hypertension     Objective:    Today's Vitals   07/28/22 0952  Weight: 195 lb (88.5 kg)  Height: 5\' 5"  (1.651 m)   Body mass index is 32.45 kg/m.     07/28/2022    9:56 AM 05/04/2022    8:53 PM 01/05/2022   10:12 AM 12/30/2021   10:02 AM 11/11/2021    8:46 AM 11/10/2021   11:02 AM 07/15/2021    8:14 AM  Advanced Directives  Does Patient Have a Medical Advance Directive? No No No No Yes Yes No  Type of Advance Directive     Healthcare Power of [employer] Power of Attorney   Does patient want to make changes to medical advance directive?     No - Patient declined    Copy of Healthcare Power of Attorney in Chart?     No - copy requested    Would patient like information on creating a medical advance directive? No - Patient declined No - Patient declined No - Patient declined No - Patient declined   No - Patient declined    Current Medications (verified) Outpatient Encounter Medications as of 07/28/2022  Medication Sig   albuterol (VENTOLIN HFA) 108 (90 Base) MCG/ACT inhaler INHALE 2 PUFFS BY MOUTH EVERY 6 HOURS AS NEEDED FOR WHEEZING AND FOR SHORTNESS OF BREATH   aspirin EC  81 MG tablet Take 81 mg by mouth daily. Swallow whole.   atorvastatin (LIPITOR) 20 MG tablet TAKE 1 TABLET BY MOUTH ONCE DAILY FOR CHOLESTEROL   Budeson-Glycopyrrol-Formoterol (BREZTRI AEROSPHERE) 160-9-4.8 MCG/ACT AERO Inhale 2 puffs into the lungs 2 (two) times daily.   buPROPion (WELLBUTRIN SR) 150 MG 12 hr tablet Take 150 mg by mouth 2 (two) times daily.   carvedilol (COREG) 3.125 MG tablet Take 1 tablet by mouth twice daily   Eszopiclone 3 MG TABS Take 1 tablet (3 mg total) by mouth at bedtime. Take immediately before bedtime   ezetimibe (ZETIA) 10 MG tablet Take 1 tablet (10 mg total) by mouth daily.   folic acid (FOLVITE) 1 MG tablet Take 1 tablet by mouth once daily   tadalafil (CIALIS) 5 MG tablet TAKE 1 TABLET BY MOUTH ONCE DAILY FOR  FREQUENT  URINATION   telmisartan (MICARDIS) 20 MG tablet Take 1 tablet by mouth once daily for blood pressure   No facility-administered encounter medications on file as of 07/28/2022.    Allergies (verified) Simvastatin and Lisinopril   History: Past Medical History:  Diagnosis Date   Actinic keratosis    Arthritis    Chronic systolic CHF (congestive heart failure) (HCC)    a. echo 6/16: EF 55-60%, Gr1DD, LA nl, PASP nl; b. echo 7/19: EF 40-45%, unable to exclude RWMA, Gr1DD, LA nl, RVSF nl, PASP nl  COPD (chronic obstructive pulmonary disease) (HCC)    Depression    Diverticulosis    Found during colonoscopy   Edema 07/21/2016   History of kidney stones    Hypertension    Left facial swelling 12/11/2021   Left lower quadrant abdominal pain 10/27/2021   Lightheadedness 04/21/2021   PAF (paroxysmal atrial fibrillation) (HCC)    a. short episodes previously noted on PPM interrogation; b. started on Xarelto 07/2017 given further episodes of Afib noted during interrogation; c. 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  Biotronik; Henrietta Hoover DR-T [id number]; placed 08/2012   Shortness of breath 04/21/2021   Sleep apnea    does not use CPAP   Symptomatic bradycardia    a. s/p Biotronik; Henrietta Hoover DR-T [id number]; placed 08/2012 at [facility]   Syncope 08/10/2017   Past Surgical History:  Procedure Laterality Date   APPENDECTOMY  age 9   CARDIAC CATHETERIZATION     7 years ago   COLONOSCOPY N/A 05/27/2014   Procedure: COLONOSCOPY;  Surgeon: Wallace Cullens, MD;  Location: [facility] END[facility]COPY;  Service: Gastroenterology;  Laterality: N/A;   DRUG INDUCED ENDOSCOPY Bilateral 11/11/2021   Procedure: DRUG INDUCED ENDOSCOPY;  Surgeon: Christia Reading, MD;  Location: [facility];  Service: ENT;  Laterality: Bilateral;   FLOOR OF MOUTH BIOPSY N/A 01/05/2022   Procedure: BIOPSY OF PALATE;  Surgeon: Christia Reading, MD;  Location: [facility];  Service: ENT;  Laterality: N/A;   IMPLANTATION OF HYPOGLOSSAL NERVE STIMULATOR Left 01/05/2022   Procedure: IMPLANTATION OF HYPOGLOSSAL NERVE STIMULATOR;  Surgeon: Christia Reading, MD;  Location: [facility];  Service: ENT;  Laterality: Left;   INSERT / REPLACE / REMOVE PACEMAKER     3 years ago   Family History  Problem Relation Age of Onset   Arthritis Mother    Heart disease Father    Hyperlipidemia Father    Hypertension Father    Cancer Paternal Grandfather    Social History   Socioeconomic History   Marital status: Single    Spouse name: Not on file   Number of children: Not on file   Years of education: Not on file   Highest education level: GED or equivalent  Occupational History   Not on file  Tobacco Use   Smoking status: Former    Packs/day: 2.00    Years: 50.00    Additional pack years: 0.00    Total pack years: 100.00    Types: Cigarettes    Start date: 1964    Quit date: 02/01/2021    Years since quitting: 1.4   Smokeless tobacco: Never   Tobacco comments:    started smoking at age 9, 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  Vaping Use    Vaping Use: Never used  Substance and Sexual Activity   Alcohol use: Yes    Alcohol/week: 8.0 - 14.0 standard drinks of alcohol    Types: 8 - 14 Cans of beer per week    Comment: daily beer   Drug use: Yes    Types: Marijuana    Comment: states smokes daily, last 11-09-21   Sexual activity: Yes  Other Topics Concern   Not on file  Social History Narrative   Not on file   Social Determinants of Health   Financial Resource Strain: Low Risk  (07/28/2022)   Overall Physicist, medical Strain (  CARDIA)    Difficulty of Paying Living Expenses: Not hard at all  Recent Concern: Financial Resource Strain - Medium Risk (05/26/2022)   Overall Financial Resource Strain (CARDIA)    Difficulty of Paying Living Expenses: Somewhat hard  Food Insecurity: No Food Insecurity (07/28/2022)   Hunger Vital Sign    Worried About Running Out of Food in the Last Year: Never true    Ran Out of Food in the Last Year: Never true  Recent Concern: Food Insecurity - Food Insecurity Present (05/26/2022)   Hunger Vital Sign    Worried About Running Out of Food in the Last Year: Sometimes true    Ran Out of Food in the Last Year: Never true  Transportation Needs: No Transportation Needs (07/28/2022)   PRAPARE - Administrator, Civil Service (Medical): No    Lack of Transportation (Non-Medical): No  Physical Activity: Insufficiently Active (07/28/2022)   Exercise Vital Sign    Days of Exercise per Week: 3 days    Minutes of Exercise per Session: 30 min  Stress: No Stress Concern Present (07/28/2022)   [employer] of Occupational Health - Occupational Stress Questionnaire    Feeling of Stress : Not at all  Recent Concern: Stress - Stress Concern Present (05/26/2022)   [employer] of Occupational Health - Occupational Stress Questionnaire    Feeling of Stress : To some extent  Social Connections: Socially Isolated (07/28/2022)   Social Connection and Isolation Panel [NHANES]    Frequency of Communication  with Friends and Family: More than three times a week    Frequency of Social Gatherings with Friends and Family: More than three times a week    Attends Religious Services: Never    Database administrator or Organizations: No    Attends Engineer, structural: Never    Marital Status: Divorced    Tobacco Counseling Counseling given: Not Answered Tobacco comments: started smoking at age 23, 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   Clinical Intake:  Pre-visit preparation completed: Yes  Pain : No/denies pain     Nutritional Risks: None Diabetes: No  How often do you need to have someone help you when you read instructions, pamphlets, or other written materials from your doctor or pharmacy?: 1 - Never  Interpreter Needed?: No  Information entered by :: Renie Ora, LPN   Activities of Daily Living    07/28/2022    9:57 AM 07/27/2022   10:39 AM  In your present state of health, do you have any difficulty performing the following activities:  Hearing? 0 0  Vision? 0 0  Difficulty concentrating or making decisions? 0 0  Walking or climbing stairs? 0 0  Dressing or bathing? 0 0  Doing errands, shopping? 0 0  Preparing Food and eating ? N N  Using the Toilet? N N  In the past six months, have you accidently leaked urine? N N  Do you have problems with loss of bowel control? N N  Managing your Medications? N N  Managing your Finances? N N  Housekeeping or managing your Housekeeping? N N    Patient Care Team: Doreene Nest, NP as PCP - General (Internal Medicine) [facility] Salvia, MD as PCP - Electrophysiology (Cardiology) Antonieta Iba, MD as PCP - Cardiology (Cardiology) Mamie Nick, DC as Referring Physician (Chiropractic Medicine) Hannah Beat, MD as Consulting Physician (Family Medicine) Kathyrn Sheriff, [facility] (Inactive) as Pharmacist (Pharmacist)  Indicate any recent Medical Services you may have  received from other than [facility] providers in the past  year (date may be approximate).     Assessment:   This is a routine wellness examination for Andren.  Hearing/Vision screen Vision Screening - Comments:: Wears rx glasses - up to date with routine eye exams with  [facility]   Dietary issues and exercise activities discussed:     Goals Addressed             This Visit's Progress    DIET - EAT MORE FRUITS AND VEGETABLES   On track      Depression Screen    07/28/2022    9:55 AM 05/27/2022    2:44 PM 01/12/2022   10:18 AM 07/15/2021    8:12 AM 07/14/2020    8:14 AM 01/11/2020   11:47 AM 12/11/2018    2:51 PM  PHQ 2/9 Scores  PHQ - 2 Score 0 6 2 0 0 0 0  PHQ- 9 Score  17 5 3  0  0    Fall Risk    07/28/2022    9:53 AM 07/27/2022   10:39 AM 07/16/2022   11:19 AM 05/27/2022    2:44 PM 04/12/2022    9:26 AM  Fall Risk   Falls in the past year? 0 0 0 0 0  Number falls in past yr: 0 0 0 0 0  Injury with Fall? 0 0 0 0 0  Risk for fall due to : No Fall Risks   No Fall Risks No Fall Risks  Follow up Falls prevention discussed   Falls evaluation completed Falls evaluation completed    MEDICARE RISK AT HOME:  Medicare Risk at Home - 07/28/22 0954     Any stairs in or around the home? Yes    If so, are there any without handrails? No    Home free of loose throw rugs in walkways, pet beds, electrical cords, etc? Yes    Adequate lighting in your home to reduce risk of falls? Yes    Life alert? No    Use of a cane, walker or w/c? No    Grab bars in the bathroom? Yes    Shower chair or bench in shower? Yes    Elevated toilet seat or a handicapped toilet? Yes             TIMED UP AND GO:  Was the test performed?  No    Cognitive Function:    07/14/2020    8:17 AM 12/11/2018    2:53 PM 12/06/2017    9:41 AM 11/05/2015    9:13 AM  MMSE - Mini Mental State Exam  Not completed: Refused     Orientation to time  5 5 5   Orientation to Place  5 5 5   Registration  3 3 3   Attention/ Calculation  5 0 0  Recall  3 3 3    Language- name 2 objects   0 0  Language- repeat  1 1 1   Language- follow 3 step command   3 3  Language- read & follow direction   0 0  Write a sentence   0 0  Copy design   0 0  Total score   20 20        07/28/2022    9:57 AM  6CIT Screen  What Year? 0 points  What month? 0 points  What time? 0 points  Count back from 20 0 points  Months in reverse 0 points  Repeat  phrase 0 points  Total Score 0 points    Immunizations Immunization History  Administered Date(s) Administered   Fluad Quad(high Dose 65+) 12/11/2020, 01/12/2022   Influenza,inj,Quad PF,6+ Mos 11/15/2014, 11/05/2015, 12/23/2016, 12/06/2017, 12/12/2018, 01/11/2020   PNEUMOCOCCAL CONJUGATE-20 12/11/2020   Pneumococcal Polysaccharide-23 11/04/2014   Td 10/26/2011   Tdap 06/15/2021   Zoster, Live 11/18/2015    TDAP status: Up to date  Flu Vaccine status: Up to date  Pneumococcal vaccine status: Up to date  Covid-19 vaccine status: Completed vaccines  Qualifies for Shingles Vaccine? Yes   Zostavax completed No   Shingrix Completed?: No.    Education has been provided regarding the importance of this vaccine. Patient has been advised to call insurance company to determine out of pocket expense if they have not yet received this vaccine. Advised may also receive vaccine at local pharmacy or Health Dept. Verbalized acceptance and understanding.  Screening Tests Health Maintenance  Topic Date Due   Zoster Vaccines- Shingrix (1 of 2) Never done   INFLUENZA VACCINE  08/26/2022   Lung Cancer Screening  06/22/2023   Medicare Annual Wellness (AWV)  07/28/2023   Colonoscopy  05/26/2024   DTaP/Tdap/Td (3 - Td or Tdap) 06/16/2031   Pneumonia Vaccine 75+ Years old  Completed   Hepatitis C Screening  Completed   HPV VACCINES  Aged Out   COVID-19 Vaccine  Discontinued    Health Maintenance  Health Maintenance Due  Topic Date Due   Zoster Vaccines- Shingrix (1 of 2) Never done    Colorectal cancer  screening: Type of screening: Colonoscopy. Completed 05/27/2014. Repeat every 10 years  Lung Cancer Screening: (Low Dose CT Chest recommended if Age 25-80 years, 20 pack-year currently smoking OR have quit w/in 15years.) does qualify.   Lung Cancer Screening Referral: referral 06/28/2022  Additional Screening:  Hepatitis C Screening: does not qualify; Completed 11/18/2015  Vision Screening: Recommended annual ophthalmology exams for early detection of glaucoma and other disorders of the eye. Is the patient up to date with their annual eye exam?  Yes  Who is the provider or what is the name of the office in which the patient attends annual eye exams? [facility]  If pt is not established with a provider, would they like to be referred to a provider to establish care? No .   Dental Screening: Recommended annual dental exams for proper oral hygiene  Diabetic Foot Exam: Diabetic Foot Exam: Overdue, Pt has been advised about the importance in completing this exam. Pt is scheduled for diabetic foot exam on next office visit .  Community Resource Referral / Chronic Care Management: CRR required this visit?  No   CCM required this visit?  No     Plan:     I have personally reviewed and noted the following in the patient's chart:   Medical and social history Use of alcohol, tobacco or illicit drugs  Current medications and supplements including opioid prescriptions. Patient is not currently taking opioid prescriptions. Functional ability and status Nutritional status Physical activity Advanced directives List of other physicians Hospitalizations, surgeries, and ER visits in previous 12 months Vitals Screenings to include cognitive, depression, and falls Referrals and appointments  In addition, I have reviewed and discussed with patient certain preventive protocols, quality metrics, and best practice recommendations. A written personalized care plan for preventive services as well  as general preventive health recommendations were provided to patient.     Lorrene Reid, LPN   0/09/8117   After Visit  Summary: (MyChart) Due to this being a telephonic visit, the after visit summary with patients personalized plan was offered to patient via MyChart   Nurse Notes: none

## 2022-07-28 NOTE — Patient Instructions (Signed)
Brandon Lopez , Thank you for taking time to come for your Medicare Wellness Visit. I appreciate your ongoing commitment to your health goals. Please review the following plan we discussed and let me know if I can assist you in the future.   These are the goals we discussed:  Goals      DIET - EAT MORE FRUITS AND VEGETABLES     Increase water intake     Starting 12/06/2017, I will continue to drink 3-4 glasses of water daily.      Patient Stated     12/11/2018, I will maintain and continue medications as prescribed.      Patient Stated     07/14/2020, I will maintain and continue medications as prescribed.         This is a list of the screening recommended for you and due dates:  Health Maintenance  Topic Date Due   Zoster (Shingles) Vaccine (1 of 2) Never done   Flu Shot  08/26/2022   Screening for Lung Cancer  06/22/2023   Medicare Annual Wellness Visit  07/28/2023   Colon Cancer Screening  05/26/2024   DTaP/Tdap/Td vaccine (3 - Td or Tdap) 06/16/2031   Pneumonia Vaccine  Completed   Hepatitis C Screening  Completed   HPV Vaccine  Aged Out   COVID-19 Vaccine  Discontinued    Advanced directives: Advance directive discussed with you today. I have provided a copy for you to complete at home and have notarized. Once this is complete please bring a copy in to our office so we can scan it into your chart.   Conditions/risks identified: Aim for 30 minutes of exercise or brisk walking, 6-8 glasses of water, and 5 servings of fruits and vegetables each day.   Next appointment: Follow up in one year for your annual wellness visit.   Preventive Care 67 Years and Older, Male  Preventive care refers to lifestyle choices and visits with your health care provider that can promote health and wellness. What does preventive care include? A yearly physical exam. This is also called an annual well check. Dental exams once or twice a year. Routine eye exams. Ask your health care provider  how often you should have your eyes checked. Personal lifestyle choices, including: Daily care of your teeth and gums. Regular physical activity. Eating a healthy diet. Avoiding tobacco and drug use. Limiting alcohol use. Practicing safe sex. Taking low doses of aspirin every day. Taking vitamin and mineral supplements as recommended by your health care provider. What happens during an annual well check? The services and screenings done by your health care provider during your annual well check will depend on your age, overall health, lifestyle risk factors, and family history of disease. Counseling  Your health care provider may ask you questions about your: Alcohol use. Tobacco use. Drug use. Emotional well-being. Home and relationship well-being. Sexual activity. Eating habits. History of falls. Memory and ability to understand (cognition). Work and work Astronomer. Screening  You may have the following tests or measurements: Height, weight, and BMI. Blood pressure. Lipid and cholesterol levels. These may be checked every 5 years, or more frequently if you are over 25 years old. Skin check. Lung cancer screening. You may have this screening every year starting at age 42 if you have a 30-pack-year history of smoking and currently smoke or have quit within the past 15 years. Fecal occult blood test (FOBT) of the stool. You may have this test every year starting  at age 43. Flexible sigmoidoscopy or colonoscopy. You may have a sigmoidoscopy every 5 years or a colonoscopy every 10 years starting at age 11. Prostate cancer screening. Recommendations will vary depending on your family history and other risks. Hepatitis C blood test. Hepatitis B blood test. Sexually transmitted disease (STD) testing. Diabetes screening. This is done by checking your blood sugar (glucose) after you have not eaten for a while (fasting). You may have this done every 1-3 years. Abdominal aortic aneurysm  (AAA) screening. You may need this if you are a current or former smoker. Osteoporosis. You may be screened starting at age 15 if you are at high risk. Talk with your health care provider about your test results, treatment options, and if necessary, the need for more tests. Vaccines  Your health care provider may recommend certain vaccines, such as: Influenza vaccine. This is recommended every year. Tetanus, diphtheria, and acellular pertussis (Tdap, Td) vaccine. You may need a Td booster every 10 years. Zoster vaccine. You may need this after age 63. Pneumococcal 13-valent conjugate (PCV13) vaccine. One dose is recommended after age 16. Pneumococcal polysaccharide (PPSV23) vaccine. One dose is recommended after age 51. Talk to your health care provider about which screenings and vaccines you need and how often you need them. This information is not intended to replace advice given to you by your health care provider. Make sure you discuss any questions you have with your health care provider. Document Released: 02/07/2015 Document Revised: 10/01/2015 Document Reviewed: 11/12/2014 Elsevier Interactive Patient Education  2017 [place] Prevention in the Home Falls can cause injuries. They can happen to people of all ages. There are many things you can do to make your home safe and to help prevent falls. What can I do on the outside of my home? Regularly fix the edges of walkways and driveways and fix any cracks. Remove anything that might make you trip as you walk through a door, such as a raised step or threshold. Trim any bushes or trees on the path to your home. Use bright outdoor lighting. Clear any walking paths of anything that might make someone trip, such as rocks or tools. Regularly check to see if handrails are loose or broken. Make sure that both sides of any steps have handrails. Any raised decks and porches should have guardrails on the edges. Have any leaves, snow, or  ice cleared regularly. Use sand or salt on walking paths during winter. Clean up any spills in your garage right away. This includes oil or grease spills. What can I do in the bathroom? Use night lights. Install grab bars by the toilet and in the tub and shower. Do not use towel bars as grab bars. Use non-skid mats or decals in the tub or shower. If you need to sit down in the shower, use a plastic, non-slip stool. Keep the floor dry. Clean up any water that spills on the floor as soon as it happens. Remove soap buildup in the tub or shower regularly. Attach bath mats securely with double-sided non-slip rug tape. Do not have throw rugs and other things on the floor that can make you trip. What can I do in the bedroom? Use night lights. Make sure that you have a light by your bed that is easy to reach. Do not use any sheets or blankets that are too big for your bed. They should not hang down onto the floor. Have a firm chair that has side arms. You  can use this for support while you get dressed. Do not have throw rugs and other things on the floor that can make you trip. What can I do in the kitchen? Clean up any spills right away. Avoid walking on wet floors. Keep items that you use a lot in easy-to-reach places. If you need to reach something above you, use a strong step stool that has a grab bar. Keep electrical cords out of the way. Do not use floor polish or wax that makes floors slippery. If you must use wax, use non-skid floor wax. Do not have throw rugs and other things on the floor that can make you trip. What can I do with my stairs? Do not leave any items on the stairs. Make sure that there are handrails on both sides of the stairs and use them. Fix handrails that are broken or loose. Make sure that handrails are as long as the stairways. Check any carpeting to make sure that it is firmly attached to the stairs. Fix any carpet that is loose or worn. Avoid having throw rugs at  the top or bottom of the stairs. If you do have throw rugs, attach them to the floor with carpet tape. Make sure that you have a light switch at the top of the stairs and the bottom of the stairs. If you do not have them, ask someone to add them for you. What else can I do to help prevent falls? Wear shoes that: Do not have high heels. Have rubber bottoms. Are comfortable and fit you well. Are closed at the toe. Do not wear sandals. If you use a stepladder: Make sure that it is fully opened. Do not climb a closed stepladder. Make sure that both sides of the stepladder are locked into place. Ask someone to hold it for you, if possible. Clearly mark and make sure that you can see: Any grab bars or handrails. First and last steps. Where the edge of each step is. Use tools that help you move around (mobility aids) if they are needed. These include: Canes. Walkers. Scooters. Crutches. Turn on the lights when you go into a dark area. Replace any light bulbs as soon as they burn out. Set up your furniture so you have a clear path. Avoid moving your furniture around. If any of your floors are uneven, fix them. If there are any pets around you, be aware of where they are. Review your medicines with your doctor. Some medicines can make you feel dizzy. This can increase your chance of falling. Ask your doctor what other things that you can do to help prevent falls. This information is not intended to replace advice given to you by your health care provider. Make sure you discuss any questions you have with your health care provider. Document Released: 11/07/2008 Document Revised: 06/19/2015 Document Reviewed: 02/15/2014 Elsevier Interactive Patient Education  2017 [employer].

## 2022-08-03 ENCOUNTER — Ambulatory Visit (INDEPENDENT_AMBULATORY_CARE_PROVIDER_SITE_OTHER): Payer: PPO | Admitting: Primary Care

## 2022-08-03 ENCOUNTER — Encounter: Payer: Self-pay | Admitting: Primary Care

## 2022-08-03 VITALS — BP 124/76 | HR 73 | Temp 97.6°F | Ht 65.0 in | Wt 196.0 lb

## 2022-08-03 DIAGNOSIS — R600 Localized edema: Secondary | ICD-10-CM | POA: Diagnosis not present

## 2022-08-03 NOTE — Assessment & Plan Note (Addendum)
Mostly to dorsal feet and ankles. No alarm signs on exam.  Differentials include lymphedema, chronic venous insufficiency. Less likely CHF exacerbation. Reviewed echocardiogram from 2023 and CT chest from 2024.  Low suspicion for DVT. No cellulitis. Not on CCB.  BNP pending. Discussed to wear compression socks and elevate legs when resting. He will update in 1-2 weeks.

## 2022-08-03 NOTE — Progress Notes (Signed)
Subjective:    Patient ID: Brandon Lopez, male    DOB: [birth year], 67 y.o.   MRN: [id number]  HPI  Brandon Lopez is a very pleasant 67 y.o. male with a history of hypertension, paroxysmal atrial fibrillation, COPD, OSA, osteoarthritis, alcohol abuse, chronic back pain who presents today to discuss pedal edema.  Symptom onset about 1 week ago with bilateral pedal edema to the dorsal feet, ankles, and now just proximal to ankles. Over the last 2 weeks he's been staying with his girlfriend, hasn't been elevating his legs per usual. Typically, when at his home, he sits in the recliner with his legs up.   His swelling isn't much improved in the morning when waking. He went back to work one week ago, noticed swelling improved while at work, worse when returning home. He's also noticed increased dyspnea with mild exertion, mostly in the morning when getting out of bed.   He denies increased physical activity, long travel, injury, redness, warmth, cough, calf swelling.   He underwent CT chest for lung cancer screening in June 2024 which showed normal heart size without pericardial fluid. He underwent echocardiogram in 2023 which showed LVEF of 60-65%, normal pressures, no significant valve disease.   BP Readings from Last 3 Encounters:  08/03/22 124/76  07/20/22 122/80  07/16/22 112/64   Wt Readings from Last 3 Encounters:  08/03/22 196 lb (88.9 kg)  07/28/22 195 lb (88.5 kg)  07/20/22 196 lb 3.2 oz (89 kg)     Review of Systems  Respiratory:  Negative for cough and shortness of breath.   Cardiovascular:  Positive for leg swelling. Negative for chest pain.  Skin:  Negative for color change.         Past Medical History:  Diagnosis Date   Actinic keratosis    Arthritis    Chronic systolic CHF (congestive heart failure) (HCC)    a. echo 6/16: EF 55-60%, Gr1DD, LA nl, PASP nl; b. echo 7/19: EF 40-45%, unable to exclude RWMA, Gr1DD, LA nl, RVSF nl, PASP nl   COPD  (chronic obstructive pulmonary disease) (HCC)    Depression    Diverticulosis    Found during colonoscopy   Edema 07/21/2016   History of kidney stones    Hypertension    Left facial swelling 12/11/2021   Left lower quadrant abdominal pain 10/27/2021   Lightheadedness 04/21/2021   PAF (paroxysmal atrial fibrillation) (HCC)    a. short episodes previously noted on PPM interrogation; b. started on Xarelto 07/2017 given further episodes of Afib noted during interrogation; c. 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 Biotronik; Henrietta Hoover DR-T [id number]; placed 08/2012   Shortness of breath 04/21/2021   Sleep apnea    does not use CPAP   Symptomatic bradycardia    a. s/p Biotronik; Henrietta Hoover DR-T [id number]; placed 08/2012 at [facility]   Syncope 08/10/2017    Social History   Socioeconomic History   Marital status: Single    Spouse name: Not on file   Number of children: Not on file   Years of education: Not on file   Highest education level: GED or equivalent  Occupational History   Not on file  Tobacco Use   Smoking status: Former    Packs/day: 2.00    Years: 50.00    Additional pack years: 0.00    Total pack  years: 100.00    Types: Cigarettes    Start date: 49    Quit date: 02/01/2021    Years since quitting: 1.5   Smokeless tobacco: Never   Tobacco comments:    started smoking at age 63, 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  Vaping Use   Vaping Use: Never used  Substance and Sexual Activity   Alcohol use: Yes    Alcohol/week: 8.0 - 14.0 standard drinks of alcohol    Types: 8 - 14 Cans of beer per week    Comment: daily beer   Drug use: Yes    Types: Marijuana    Comment: states smokes daily, last 11-09-21   Sexual activity: Yes  Other Topics Concern   Not on file  Social History Narrative   Not on file   Social Determinants of Health   Financial Resource Strain:  Low Risk  (07/28/2022)   Overall Financial Resource Strain (CARDIA)    Difficulty of Paying Living Expenses: Not hard at all  Recent Concern: Financial Resource Strain - Medium Risk (05/26/2022)   Overall Financial Resource Strain (CARDIA)    Difficulty of Paying Living Expenses: Somewhat hard  Food Insecurity: No Food Insecurity (07/28/2022)   Hunger Vital Sign    Worried About Running Out of Food in the Last Year: Never true    Ran Out of Food in the Last Year: Never true  Recent Concern: Food Insecurity - Food Insecurity Present (05/26/2022)   Hunger Vital Sign    Worried About Running Out of Food in the Last Year: Sometimes true    Ran Out of Food in the Last Year: Never true  Transportation Needs: No Transportation Needs (07/28/2022)   PRAPARE - Administrator, Civil Service (Medical): No    Lack of Transportation (Non-Medical): No  Physical Activity: Insufficiently Active (07/28/2022)   Exercise Vital Sign    Days of Exercise per Week: 3 days    Minutes of Exercise per Session: 30 min  Stress: No Stress Concern Present (07/28/2022)   [employer] of Occupational Health - Occupational Stress Questionnaire    Feeling of Stress : Not at all  Recent Concern: Stress - Stress Concern Present (05/26/2022)   [employer] of Occupational Health - Occupational Stress Questionnaire    Feeling of Stress : To some extent  Social Connections: Socially Isolated (07/28/2022)   Social Connection and Isolation Panel [NHANES]    Frequency of Communication with Friends and Family: More than three times a week    Frequency of Social Gatherings with Friends and Family: More than three times a week    Attends Religious Services: Never    Database administrator or Organizations: No    Attends Banker Meetings: Never    Marital Status: Divorced  Catering manager Violence: Not At Risk (07/28/2022)   Humiliation, Afraid, Rape, and Kick questionnaire    Fear of Current or Ex-Partner: No     Emotionally Abused: No    Physically Abused: No    Sexually Abused: No    Past Surgical History:  Procedure Laterality Date   APPENDECTOMY  age 9   CARDIAC CATHETERIZATION     7 years ago   COLONOSCOPY N/A 05/27/2014   Procedure: COLONOSCOPY;  Surgeon: Wallace Cullens, MD;  Location: [facility];  Service: Gastroenterology;  Laterality: N/A;   DRUG INDUCED ENDOSCOPY Bilateral 11/11/2021   Procedure: DRUG INDUCED ENDOSCOPY;  Surgeon: Christia Reading, MD;  Location: [facility];  Service: ENT;  Laterality: Bilateral;   FLOOR OF MOUTH BIOPSY N/A 01/05/2022   Procedure: BIOPSY OF PALATE;  Surgeon: Christia Reading, MD;  Location: [facility];  Service: ENT;  Laterality: N/A;   IMPLANTATION OF HYPOGLOSSAL NERVE STIMULATOR Left 01/05/2022   Procedure: IMPLANTATION OF HYPOGLOSSAL NERVE STIMULATOR;  Surgeon: Christia Reading, MD;  Location:  SURGERY CENTER;  Service: ENT;  Laterality: Left;   INSERT / REPLACE / REMOVE PACEMAKER     3 years ago    Family History  Problem Relation Age of Onset   Arthritis Mother    Heart disease Father    Hyperlipidemia Father    Hypertension Father    Cancer Paternal Grandfather     Allergies  Allergen Reactions   Simvastatin Diarrhea   Lisinopril Cough    Current Outpatient Medications on File Prior to Visit  Medication Sig Dispense Refill   albuterol (VENTOLIN HFA) 108 (90 Base) MCG/ACT inhaler INHALE 2 PUFFS BY MOUTH EVERY 6 HOURS AS NEEDED FOR WHEEZING AND FOR SHORTNESS OF BREATH 18 g 0   aspirin EC 81 MG tablet Take 81 mg by mouth daily. Swallow whole.     atorvastatin (LIPITOR) 20 MG tablet TAKE 1 TABLET BY MOUTH ONCE DAILY FOR CHOLESTEROL 90 tablet 2   Budeson-Glycopyrrol-Formoterol (BREZTRI AEROSPHERE) 160-9-4.8 MCG/ACT AERO Inhale 2 puffs into the lungs 2 (two) times daily. 10.7 g 11   buPROPion (WELLBUTRIN SR) 150 MG 12 hr tablet Take 150 mg by mouth 2 (two) times daily.     carvedilol (COREG) 3.125 MG tablet  Take 1 tablet by mouth twice daily 180 tablet 2   Eszopiclone 3 MG TABS Take 1 tablet (3 mg total) by mouth at bedtime. Take immediately before bedtime 30 tablet 0   ezetimibe (ZETIA) 10 MG tablet Take 1 tablet (10 mg total) by mouth daily. 90 tablet 3   folic acid (FOLVITE) 1 MG tablet Take 1 tablet by mouth once daily 90 tablet 0   tadalafil (CIALIS) 5 MG tablet TAKE 1 TABLET BY MOUTH ONCE DAILY FOR  FREQUENT  URINATION 90 tablet 3   telmisartan (MICARDIS) 20 MG tablet Take 1 tablet by mouth once daily for blood pressure 90 tablet 3   No current facility-administered medications on file prior to visit.    BP 124/76   Pulse 73   Temp 97.6 F (36.4 C) (Temporal)   Ht 5\' 5"  (1.651 m)   Wt 196 lb (88.9 kg)   SpO2 95%   BMI 32.62 kg/m  Objective:   Physical Exam Cardiovascular:     Rate and Rhythm: Normal rate and regular rhythm.     Pulses:          Dorsalis pedis pulses are 2+ on the right side and 2+ on the left side.       Posterior tibial pulses are 2+ on the right side and 2+ on the left side.  Pulmonary:     Effort: Pulmonary effort is normal.     Breath sounds: Normal breath sounds. No wheezing or rales.  Musculoskeletal:     Cervical back: Neck supple.     Right lower leg: 2+ Edema present.     Left lower leg: 2+ Edema present.  Skin:    General: Skin is warm and dry.     Findings: No erythema.  Neurological:     Mental Status: He is alert and oriented to person, place, and time.           Assessment &  Plan:  Bilateral lower extremity edema Assessment & Plan: Mostly to dorsal feet and ankles. No alarm signs on exam.  Differentials include lymphedema, chronic venous insufficiency. Less likely CHF exacerbation. Reviewed echocardiogram from 2023 and CT chest from 2024.  Low suspicion for DVT. No cellulitis. Not on CCB.  BNP pending. Discussed to wear compression socks and elevate legs when resting. He will update in 1-2 weeks.   Orders: -     Brain  natriuretic peptide        Doreene Nest, NP

## 2022-08-03 NOTE — Patient Instructions (Signed)
Start wearing compression socks when you are working and active.  Elevate your legs anytime you are sitting.  Stop by the lab prior to leaving today. I will notify you of your results once received.   Please update me in 1-2 weeks.  It was a pleasure to see you today!

## 2022-08-04 LAB — BRAIN NATRIURETIC PEPTIDE: Pro B Natriuretic peptide (BNP): 26 pg/mL (ref 0.0–100.0)

## 2022-08-06 NOTE — Telephone Encounter (Signed)
Patient called in to follow up on this message. Thank you!

## 2022-08-11 ENCOUNTER — Ambulatory Visit: Payer: PPO | Admitting: Clinical

## 2022-08-11 DIAGNOSIS — F331 Major depressive disorder, recurrent, moderate: Secondary | ICD-10-CM

## 2022-08-11 NOTE — Progress Notes (Signed)
[facility] Counselor/Therapist Progress Note  Patient ID: Brandon Lopez, MRN: [id number]    Date: 08/11/22  Time Spent: 8:34  am - 9:32 am : 58 Minutes  Treatment Type: Individual Therapy.  Reported Symptoms: Patient reported depressed mood  Mental Status Exam: Appearance:  Neat and Well Groomed     Behavior: Evasive  Motor: Normal  Speech/Language:  Clear and Coherent  Affect: Appropriate  Mood: depressed  Thought process: tangential  Thought content:   Tangential  Sensory/Perceptual disturbances:   WNL  Orientation: oriented to person, place, and situation  Attention: Good  Concentration: Good  Memory: WNL  Fund of knowledge:  Good  Insight:   Fair  Judgment:  Fair  Impulse Control: Fair   Risk Assessment: Danger to Self:  No Patient denied current suicidal ideation  Self-injurious Behavior: No Danger to Others: No Patient denied current homicidal ideation Duty to Warn:no Physical Aggression / Violence:No  Access to Firearms a concern: No  Gang Involvement:No   Subjective:  Patient stated, "they were going good up until about a week ago" in response to events since last session.  Patient stated, "it's not easy" in regards to patient's relationship with his significant other. Patient reported his significant other's decisions prior to their reconciliation has impacted their relationship. Patient stated, "she did things that I don't believe in" and stated, "it affected me more than I anticipated". Patient stated, "we're just kind of stagnant" in regards to patient's relationship with his significant other. Patient stated, " I don't know I'll have to think about it" in response to participation in couples counseling. Patient reported an increase in depressed mood after leaving significant other's home recently. Patient stated, "I don't like where I'm at, I don't like where our relationship is". Patient stated,  "that pretty much explains it" in response  to diagnosis. Patient stated,  "I think it would help me" in response to recommendation for therapy. Patient reported feeling if he discloses what bothers him it will offend others and reported he feels it is better that he not share his feelings.   Interventions:  psycho education Clinician conducted session in person at clinician's office at [facility] Reviewed events since last session. Clinician reviewed diagnosis and treatment recommendations. Provided psycho education related to diagnosis, treatment, and the use of CBT in therapy. Discussed recommendation for couples counseling. Clinician requested for homework patient consider potential goals for therapy and what patient found to be helpful/not helpful as it relates to patient's previous participation in therapy.   Collaboration of Care: Other not required at this time  Diagnosis:  Major depressive disorder, recurrent episode, moderate (HCC) R/O Alcohol Use Disorder R/O Cannabis Use Disorder  Plan: Goals to be developed during follow up appointment on 09/14/22.                  Doree Barthel, LCSW

## 2022-08-15 DIAGNOSIS — H9319 Tinnitus, unspecified ear: Secondary | ICD-10-CM

## 2022-08-18 ENCOUNTER — Other Ambulatory Visit: Payer: Self-pay | Admitting: Primary Care

## 2022-08-18 DIAGNOSIS — B370 Candidal stomatitis: Secondary | ICD-10-CM | POA: Diagnosis not present

## 2022-08-18 DIAGNOSIS — H9319 Tinnitus, unspecified ear: Secondary | ICD-10-CM

## 2022-08-18 DIAGNOSIS — H6501 Acute serous otitis media, right ear: Secondary | ICD-10-CM | POA: Diagnosis not present

## 2022-08-18 DIAGNOSIS — H6991 Unspecified Eustachian tube disorder, right ear: Secondary | ICD-10-CM | POA: Diagnosis not present

## 2022-08-22 ENCOUNTER — Other Ambulatory Visit: Payer: Self-pay | Admitting: Primary Care

## 2022-08-22 DIAGNOSIS — F419 Anxiety disorder, unspecified: Secondary | ICD-10-CM

## 2022-08-24 ENCOUNTER — Other Ambulatory Visit: Payer: Self-pay | Admitting: Internal Medicine

## 2022-08-25 NOTE — Telephone Encounter (Signed)
Eszopiclone refilled at [facility]

## 2022-08-29 ENCOUNTER — Encounter: Payer: Self-pay | Admitting: Internal Medicine

## 2022-08-29 NOTE — Assessment & Plan Note (Signed)
He didn't think temazepam or trazodone helped Plan- try Lunesta 3 mg

## 2022-08-29 NOTE — Progress Notes (Signed)
HPI M former smoker followed for OSA, COPD (100 pkyrs, QS [id number]), Asbestos exposure, complicated by CHF/Pacemakert,, HTN, PAFib, COPD, HxTobacco Use, OSA, Diverticulosis, Osteoarthritis, ETOH, Anxiety/Depression, Hx Positive TB PPD,  -Ventolin hfa, Breztri,  NPSG [place] 09/10/15- AHI 44.3/ hr, desaturation to 83%, body weight 184 lbs, CPAP to 10 Inspire Hypoglossal Nerve Stimulator 01/05/22-Dr Genevie Cheshire Activation 02/09/22  ======================================================================================   07/20/22- 66 yoM former smoker followed for OSA, COPD (100 pkyrs, QS [id number]), Asbestos exposure, complicated by CHF/Pacemakert,, HTN, PAFib, CAD, HxTobacco Use, OSA, Diverticulosis, Osteoarthritis, ETOH, Anxiety/Depression, Hx Positive TB PPD,  He had installed and removed asbestos and he had worked in [employer] in addition to a long smoking history. -Ventolin hfa, Breztri,  CT chest low dose screen/ Asbestos exposure-   Inspire Hypoglossal Nerve Stimulator placed 01/05/22-Dr Genevie Cheshire Activation 02/09/22 Inspire Sleep  Study Titration-05/18/22-   Download- usage 97% of nights nights> 4 hours, avg 7 hrs 25 min/ night amplitude 0.4, avg pauses// night = 1.5 CT chest low dose screen 06/26/22- IMPRESSION: 1. Lung-RADS 1S, negative. Continue annual screening with low-dose chest CT without contrast in 12 months. 2. The "S" modifier above refers to potentially clinically significant non lung cancer related findings. Specifically, there is aortic atherosclerosis, in addition to left main and three-vessel coronary artery disease. Please note that although the presence of coronary artery calcium documents the presence of coronary artery disease, the severity of this disease and any potential stenosis cannot be assessed on this non-gated CT examination. Assessment for potential risk factor modification, dietary therapy or pharmacologic therapy may be warranted, if clinically  indicated. 3. Mild diffuse bronchial wall thickening with severe centrilobular and paraseptal emphysema; imaging findings suggestive of underlying COPD. Aortic Atherosclerosis (ICD10-I70.0) and Emphysema (ICD10-J43.9).  08/31/22-66 yoM former smoker followed for OSA, COPD (100 pkyrs, QS [id number]), Asbestos exposure, complicated by CHF/Pacemaker, ASCVD, HTN, PAFib, CAD, HxTobacco Use, OSA, Diverticulosis, Osteoarthritis, ETOH, Anxiety/Depression, Hx Positive TB PPD,  He had installed and removed asbestos and he had worked in [employer] in addition to a long smoking history. -Ventolin hfa, Breztri, Lunesta, CT chest low dose screen/ Asbestos exposure-    Inspire Hypoglossal Nerve Stimulator placed 01/05/22-Dr Genevie Cheshire Activation 02/09/22 Inspire Sleep  Study Titration-05/18/22- Body weight today-195 lbs Currently on Level 2 (0.7 V).  He is comfortable with this voltage.  He reports waking every 2 hours for a few minutes.  If he naps he feels worse and wakes more at night so he avoids daytime naps.  Average use of his Earnest Bailey is about 6 hours.  He has tried [place] which sometimes seems to carry over too long into the morning. We discussed cutting back on Lunesta and/or may be trying an OTC med. We discussed moving bedtime a little later-maybe 11:30 PM-to see if that would reduce some of the waking episodes at night. Inspire technician is increasing the pause time to 15 minutes. Denies chest pain, change in cough or respiratory distress.  ROS-see HPI   + = positive Constitutional:    weight loss, night sweats, fevers, chills, fatigue, lassitude. HEENT:    headaches, difficulty swallowing, tooth/dental problems, sore throat,       sneezing, itching, ear ache, nasal congestion, post nasal drip, snoring CV:    chest pain, orthopnea, PND, swelling in lower extremities, anasarca,                                   dizziness,  palpitations Resp:   +shortness of breath with exertion or at rest.                 productive cough,   non-productive cough, coughing up of blood.              change in color of mucus.  wheezing.   Skin:    rash or lesions. GI:  +heartburn, indigestion, abdominal pain, nausea, vomiting, diarrhea,                 change in bowel habits, +loss of appetite GU: dysuria, change in color of urine, no urgency or frequency.   flank pain. MS:   joint pain, stiffness, decreased range of motion, back pain. Neuro-     nothing unusual Psych:  change in mood or affect.  depression or anxiety.   memory loss.  OBJ- Physical Exam   +overweight General- Alert, Oriented, Affect-appropriate, Distress- none Skin- rash-none, lesions- none, excoriation- none Lymphadenopathy- none Head- atraumatic            Eyes- Gross vision intact, PERRLA, conjunctivae and secretions clear            Ears- Hearing, canals-normal            Nose- Clear, no-Septal dev, mucus, polyps, erosion, perforation             Throat- Mallampati II , mucosa clear , drainage- none, tonsils- atrophic Neck- flexible , trachea midline, no stridor , thyroid nl, carotid no bruit Chest - symmetrical excursion , unlabored           Heart/CV- RRR , no murmur , no gallop  , no rub, nl s1 s2                           - JVD- none , edema- none, stasis changes- none, varices- none           Lung- + clear/ no rhonchi, no distinct crackle,  wheeze- none, cough+light , dullness-none, rub- none           Chest wall- +pacemaker on right. + Inspire on left Abd-  Br/ Gen/ Rectal- Not done, not indicated Extrem- cyanosis- none, clubbing, none, atrophy- none, strength- nl Neuro- grossly intact to observatio

## 2022-08-29 NOTE — Assessment & Plan Note (Signed)
CT chest 06/26/22 doesn't report obvious asbestos related ILD or plaques.

## 2022-08-29 NOTE — Assessment & Plan Note (Signed)
He minimizes current drinking- 2 beers

## 2022-08-29 NOTE — Assessment & Plan Note (Addendum)
Inspire tech helping him revert to settings of 2 months ago, when he felt better rested, despite results of Inspire titration sleep study on 4/23 He will adjust as tolerated up to level 5 and see if he can tell where he sleeps best.

## 2022-08-31 ENCOUNTER — Ambulatory Visit: Payer: PPO | Admitting: Internal Medicine

## 2022-08-31 ENCOUNTER — Encounter: Payer: Self-pay | Admitting: Internal Medicine

## 2022-08-31 VITALS — BP 124/80 | HR 77 | Ht 65.0 in | Wt 195.2 lb

## 2022-08-31 DIAGNOSIS — Z77090 Contact with and (suspected) exposure to asbestos: Secondary | ICD-10-CM

## 2022-08-31 DIAGNOSIS — G4733 Obstructive sleep apnea (adult) (pediatric): Secondary | ICD-10-CM

## 2022-08-31 DIAGNOSIS — J449 Chronic obstructive pulmonary disease, unspecified: Secondary | ICD-10-CM

## 2022-08-31 NOTE — Patient Instructions (Signed)
We increased pause time to 15 minutes  We suggested trying bedtime a little later- maybe 11:30  It is ok to wake some during the night  Ok to try 1/2 Lunesta and 1/2 or 1 over the counter med

## 2022-09-06 NOTE — Progress Notes (Unsigned)
Cardiology Office Note  Date:  09/07/2022   ID:  Brandon Lopez, DOB Jan 29, 1955, MRN [id number]  PCP:  Doreene Nest, NP   Chief Complaint  Patient presents with   Follow-up    Patient denies new or acute cardiac problems/concerns today.      HPI:  Brandon Lopez is a 67 y.o. male past medical history of Cardiac cath >10 yr ago, "20% somewhere" COPD, smoker 2 ppd, quit, restarted cigars Hyperlipidemia pacemaker implanted at [facility] for dyspnea and presyncope.  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, woodworking, framing,  BP at home 120/80 Elevated on arrival, better on recheck  Pacer downloads reviewed, no significant atrial fibrillation   Continues drinking 2 cases of beer per week normaL LFTs on lab work in late 2023  Limited  by COPD, uses inhaler  Denies significant chest pain concerning for angina  EKG personally reviewed by myself on todays visit EKG Interpretation Date/Time:  Tuesday September 07 2022 08:57:27 EDT Ventricular Rate:  74 PR Interval:  288 QRS Duration:  88 QT Interval:  386 QTC Calculation: 428 R Axis:   52  Text Interpretation: Atrial-paced rhythm with prolonged AV conduction Anterior infarct (cited on or before 11-Nov-2020) When compared with ECG of 11-Nov-2020 12:18, QRS axis Shifted left Nonspecific T wave abnormality, improved in Anterior leads Nonspecific T wave abnormality now evident in Lateral leads Confirmed by Brandon Lopez [phone]) on 09/07/2022 9:10:21 AM    Other past medical history reviewed Lab work reviewed with him HGBA1C 5.6 Total chiol 137, LDL 63  Pacemaker followed by Dr. Graciela Husbands  CHADS VASC 1 Not on anticoagulation  Echocardiogram July 2019 - Left ventricle: The cavity size was normal. Systolic  function was   mildly to moderately reduced. The estimated ejection fraction was   in the range of 40% to 45%. Regional wall motion abnormalities   cannot be excluded. Doppler parameters are consistent with   abnormal left ventricular relaxation (grade 1 diastolic   dysfunction). - Left atrium: The atrium was normal in size. - Right ventricle: Pacer wire or catheter noted in right ventricle.   Systolic function was normal. - Pulmonary arteries: Systolic pressure was within the normal   range.  Stress test August 2019 reviewed with him Pharmacological myocardial perfusion imaging study with no significant  Ischemia Low risk scan  CT scan abdomen  moderate aortic atherosclerosis, iliac artery disease, SFA disease  Other past medical history reviewed Hospitalized 7/19 for an unusual episode where he had some abdominal discomfort tingling in his chest and not feeling right.  EMS was called.  Notes not available.  There was a comment about unresponsive and cyanotic.  The day before he had had atrial fibrillation for about 6 hours.     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  PSH:    Past Surgical History:  Procedure Laterality Date   APPENDECTOMY  age 1  CARDIAC CATHETERIZATION     7 years ago   COLONOSCOPY N/A 05/27/2014   Procedure: COLONOSCOPY;  Surgeon: Wallace Cullens, MD;  [facility]: [facility];  Service: Gastroenterology;  Laterality: N/A;   DRUG  INDUCED ENDOSCOPY Bilateral 11/11/2021   Procedure: DRUG INDUCED ENDOSCOPY;  Surgeon: Christia Reading, MD;  Location: [facility];  Service: ENT;  Laterality: Bilateral;   FLOOR OF MOUTH BIOPSY N/A 01/05/2022   Procedure: BIOPSY OF PALATE;  Surgeon: Christia Reading, MD;  Location: [facility];  Service: ENT;  Laterality: N/A;   IMPLANTATION OF HYPOGLOSSAL NERVE STIMULATOR Left 01/05/2022   Procedure: IMPLANTATION OF HYPOGLOSSAL NERVE STIMULATOR;  Surgeon: Christia Reading, MD;  Location: [facility];  Service: ENT;  Laterality: Left;   INSERT / REPLACE / REMOVE PACEMAKER     3 years ago    Current Outpatient Medications  Medication Sig Dispense Refill   albuterol (VENTOLIN HFA) 108 (90 Base) MCG/ACT inhaler INHALE 2 PUFFS BY MOUTH EVERY 6 HOURS AS NEEDED FOR WHEEZING AND FOR SHORTNESS OF BREATH 18 g 0   aspirin EC 81 MG tablet Take 81 mg by mouth daily. Swallow whole.     atorvastatin (LIPITOR) 20 MG tablet TAKE 1 TABLET BY MOUTH ONCE DAILY FOR CHOLESTEROL 90 tablet 2   Budeson-Glycopyrrol-Formoterol (BREZTRI AEROSPHERE) 160-9-4.8 MCG/ACT AERO Inhale 2 puffs into the lungs 2 (two) times daily. 10.7 g 11   buPROPion (WELLBUTRIN SR) 150 MG 12 hr tablet Take 150 mg by mouth 2 (two) times daily.     carvedilol (COREG) 3.125 MG tablet Take 1 tablet by mouth twice daily 180 tablet 2   ezetimibe (ZETIA) 10 MG tablet Take 1 tablet (10 mg total) by mouth daily. 90 tablet 3   tadalafil (CIALIS) 5 MG tablet TAKE 1 TABLET BY MOUTH ONCE DAILY FOR  FREQUENT  URINATION 90 tablet 3   telmisartan (MICARDIS) 20 MG tablet Take 1 tablet by mouth once daily for blood pressure 90 tablet 3   Eszopiclone 3 MG TABS TAKE 1 TABLET BY MOUTH ONCE DAILY AT BEDTIME (TAKE  IMMEDIATELY  BEFORE  BEDTIME) (Patient not taking: Reported on 09/07/2022) 30 tablet 5   folic acid (FOLVITE) 1 MG tablet Take 1 tablet by mouth once daily (Patient not taking: Reported on 09/07/2022) 90 tablet 0   No  current facility-administered medications for this visit.    Allergies:   Simvastatin and Lisinopril   Social History:  The patient  reports that he quit smoking about 19 months ago. His smoking use included cigarettes. He started smoking about 60 years ago. He has a 118 pack-year smoking history. He has never used smokeless tobacco. He reports current alcohol use of about 8.0 - 14.0 standard drinks of alcohol per week. He reports current drug use. Drug: Marijuana.   Family History:   family history includes Arthritis in his mother; Cancer in his paternal grandfather; Heart disease in his father; Hyperlipidemia in his father; Hypertension in his father.    Review of Systems: Review of Systems  Constitutional: Negative.   HENT: Negative.    Respiratory: Negative.    Cardiovascular: Negative.   Gastrointestinal: Negative.   Musculoskeletal: Negative.   Neurological: Negative.   Psychiatric/Behavioral: Negative.    All other systems reviewed and are negative.  PHYSICAL EXAM: VS:  BP (!) 148/84 (BP Location: Left Arm, Patient Position: Sitting, Cuff Size: Normal)   Pulse 74   Ht 5\' 5"  (1.651 m)   Wt 195 lb 3.2 oz (  88.5 kg)   BMI 32.48 kg/m  , BMI Body mass index is 32.48 kg/m. Constitutional:  oriented to person, place, and time. No distress.  HENT:  Head: Grossly normal Eyes:  no discharge. No scleral icterus.  Neck: No JVD, no carotid bruits  Cardiovascular: Regular rate and rhythm, no murmurs appreciated Pulmonary/Chest: Clear to auscultation bilaterally, no wheezes or rails Abdominal: Soft.  no distension.  no tenderness.  Musculoskeletal: Normal range of motion Neurological:  normal muscle tone. Coordination normal. No atrophy Skin: Skin warm and dry Psychiatric: normal affect, pleasant  Recent Labs: 10/27/2021: ALT 25; BUN 11; Creatinine, Ser 0.70; Hemoglobin 15.6; Platelets 191.0; Potassium 4.1; Sodium 135 08/03/2022: Pro B Natriuretic peptide (BNP) 26.0    Lipid  Panel Lab Results  Component Value Date   CHOL 153 01/12/2022   HDL 41.10 01/12/2022   LDLCALC 72 12/11/2020   TRIG 259.0 (H) 01/12/2022    Wt Readings from Last 3 Encounters:  09/07/22 195 lb 3.2 oz (88.5 kg)  08/31/22 195 lb 3.2 oz (88.5 kg)  08/03/22 196 lb (88.9 kg)     ASSESSMENT AND PLAN:  Chronic obstructive pulmonary disease, unspecified COPD type (HCC) Smoking cessation recommended Chronic shortness of breath On inhaler  Paroxysmal atrial fibrillation (HCC)  Maintaining normal sinus rhythm, atrial paced Pacemaker downloads reviewed , no notable A-fib  CAD, Coronary calcium noted on prior scans On lipitor and zetia,  Off cigarettes, smoking cigars Ejection fraction 60 to 65% in March 2023 Eyes angina  Essential hypertension Blood pressure initially elevated, recheck is well controlled on today's visit. No changes made to the medications.  Pacemaker Followed by Dr. Graciela Husbands  Edema, unspecified type No significant edema  Cigarette smoker Long discussion, Now smoking cigars  Shortness of breath Myoview: 08/2017: low risk Normal EF Smoking cessation recommended For any worsening symptoms would order Myoview/cardiac CTA    Total encounter time more than 30 minutes  Greater than 50% was spent in counseling and coordination of care with the patient   Orders Placed This Encounter  Procedures   EKG 12-Lead     Signed, Dossie Arbour, M.D., Ph.D. 09/07/2022  [facility] [place], [place] [phone]

## 2022-09-07 ENCOUNTER — Ambulatory Visit: Payer: PPO | Attending: Cardiovascular Disease | Admitting: Cardiovascular Disease

## 2022-09-07 ENCOUNTER — Encounter: Payer: Self-pay | Admitting: Cardiovascular Disease

## 2022-09-07 ENCOUNTER — Ambulatory Visit (INDEPENDENT_AMBULATORY_CARE_PROVIDER_SITE_OTHER): Payer: PPO

## 2022-09-07 VITALS — BP 128/76 | HR 74 | Ht 65.0 in | Wt 195.2 lb

## 2022-09-07 DIAGNOSIS — I2510 Atherosclerotic heart disease of native coronary artery without angina pectoris: Secondary | ICD-10-CM | POA: Diagnosis not present

## 2022-09-07 DIAGNOSIS — R600 Localized edema: Secondary | ICD-10-CM | POA: Diagnosis not present

## 2022-09-07 DIAGNOSIS — E782 Mixed hyperlipidemia: Secondary | ICD-10-CM | POA: Diagnosis not present

## 2022-09-07 DIAGNOSIS — G4733 Obstructive sleep apnea (adult) (pediatric): Secondary | ICD-10-CM | POA: Diagnosis not present

## 2022-09-07 DIAGNOSIS — J449 Chronic obstructive pulmonary disease, unspecified: Secondary | ICD-10-CM

## 2022-09-07 DIAGNOSIS — I700 Atherosclerosis of aorta: Secondary | ICD-10-CM

## 2022-09-07 DIAGNOSIS — I495 Sick sinus syndrome: Secondary | ICD-10-CM | POA: Diagnosis not present

## 2022-09-07 DIAGNOSIS — I10 Essential (primary) hypertension: Secondary | ICD-10-CM

## 2022-09-07 DIAGNOSIS — Z950 Presence of cardiac pacemaker: Secondary | ICD-10-CM

## 2022-09-07 DIAGNOSIS — I480 Paroxysmal atrial fibrillation: Secondary | ICD-10-CM

## 2022-09-07 NOTE — Patient Instructions (Signed)

## 2022-09-14 ENCOUNTER — Ambulatory Visit: Payer: PPO | Admitting: Clinical

## 2022-09-14 DIAGNOSIS — F331 Major depressive disorder, recurrent, moderate: Secondary | ICD-10-CM

## 2022-09-14 NOTE — Progress Notes (Signed)
[facility] Counselor/Therapist Progress Note  Patient ID: Brandon Lopez, MRN: [id number]    Date: 09/14/22  Time Spent: 8:47  am - 9:28 am : 41 Minutes  Treatment Type: Individual Therapy.  Reported Symptoms: Patient reported recent depressed mood and feeling "fidgety".   Mental Status Exam: Appearance:  Neat and Well Groomed     Behavior: Appropriate  Motor: Normal  Speech/Language:  Clear and Coherent  Affect: Appropriate  Mood: normal  Thought process: tangential  Thought content:   Tangential  Sensory/Perceptual disturbances:   WNL  Orientation: oriented to person, place, and situation  Attention: Good  Concentration: Good  Memory: WNL  Fund of knowledge:  Good  Insight:   Fair  Judgment:  Fair  Impulse Control: Fair   Risk Assessment: Danger to Self:  No Patient denied current suicidal ideation  Self-injurious Behavior: No Danger to Others: No Patient denied current homicidal ideation Duty to Warn:no Physical Aggression / Violence:No  Access to Firearms a concern: No  Gang Involvement:No   Subjective:  Patient reported he was late for today's appointment as he thought today's appointment was at 9 am. Patient reported no changes since last session. Patient reported he experienced a few days in which his mood was depressed since last session. Patient reported he feels "being fidgety helps" and patient reported when he starts to feel depressed he finds activities to participate in.  Patient reported having time off from work can be a trigger for depressed mood. Patient stated, "pretty good" in response to current mood. Patient reported he has a good rapport with his PCP. Patient reported concern about their dog when significant other returns to work. Patient reported if the status of their relationship was different he would consider moving back in with significant other. Patient stated, "I haven't really asked her" in response to couples counseling  and reported he has not asked significant other due to significant other's recent stressors. Patient stated, "once things calm down and get back to normal I'll probably mention it to her".   Interventions: Cognitive Behavioral Therapy. Clinician conducted session in person at clinician's office at [facility]. Discussed patient's arrival time for today's appointment. Assessed patient's mood since last session and patient's current mood. Discussed triggers for recent depressive symptoms. Provided psycho education related to building rapport and provided psycho education related to depression. Reviewed recommendation for couples counseling and discussed barriers to patient discussing couples counseling with significant other.   Collaboration of Care: Other not required at this time  Diagnosis:  Major depressive disorder, recurrent episode, moderate (HCC) R/O Alcohol Use Disorder R/O Cannabis Use Disorder  Plan: Goals to be determined during follow up appointment on 10/12/22.                     Doree Barthel, LCSW

## 2022-09-15 ENCOUNTER — Encounter: Payer: Self-pay | Admitting: Internal Medicine

## 2022-09-15 NOTE — Assessment & Plan Note (Signed)
He seems to be stable using Breztri and occasional rescue inhaler. Plan-continue meds.

## 2022-09-15 NOTE — Assessment & Plan Note (Signed)
He continues CT chest low-dose screening program.

## 2022-09-15 NOTE — Assessment & Plan Note (Signed)
Pause time increased to 15 minutes.  He will try moving bedtime a little later-11:30 PM.  He can continue voltage at level 2 or level 3 as he finds most comfortable.

## 2022-09-17 DIAGNOSIS — M19012 Primary osteoarthritis, left shoulder: Secondary | ICD-10-CM | POA: Diagnosis not present

## 2022-09-20 ENCOUNTER — Encounter: Payer: Self-pay | Admitting: Internal Medicine

## 2022-09-20 DIAGNOSIS — H9313 Tinnitus, bilateral: Secondary | ICD-10-CM | POA: Diagnosis not present

## 2022-09-20 DIAGNOSIS — H6993 Unspecified Eustachian tube disorder, bilateral: Secondary | ICD-10-CM | POA: Diagnosis not present

## 2022-09-20 DIAGNOSIS — H6991 Unspecified Eustachian tube disorder, right ear: Secondary | ICD-10-CM | POA: Diagnosis not present

## 2022-09-22 NOTE — Progress Notes (Signed)
Remote pacemaker transmission.   

## 2022-10-12 ENCOUNTER — Ambulatory Visit: Payer: PPO | Admitting: Clinical

## 2022-10-12 ENCOUNTER — Ambulatory Visit: Payer: PPO | Admitting: Internal Medicine

## 2022-10-12 DIAGNOSIS — F331 Major depressive disorder, recurrent, moderate: Secondary | ICD-10-CM | POA: Diagnosis not present

## 2022-10-12 NOTE — Progress Notes (Signed)
[facility] Counselor/Therapist Progress Note  Patient ID: Brandon Lopez, MRN: [id number]    Date: 10/12/22  Time Spent: 9:35  am - 10:32 am : 57 Minutes  Treatment Type: Individual Therapy.  Reported Symptoms: Patient reported depressed mood  Mental Status Exam: Appearance:  Neat and Well Groomed     Behavior: Appropriate  Motor: Normal  Speech/Language:  Clear and Coherent  Affect: Flat  Mood: depressed  Thought process: normal  Thought content:   WNL  Sensory/Perceptual disturbances:   WNL  Orientation: oriented to person, place, and situation  Attention: Good  Concentration: Good  Memory: WNL  Fund of knowledge:  Good  Insight:   Fair  Judgment:  Fair  Impulse Control: Fair   Risk Assessment: Danger to Self:  No Patient denied current suicidal ideation  Self-injurious Behavior: No Danger to Others: No Patient denied current homicidal ideation Duty to Warn:no Physical Aggression / Violence:No  Access to Firearms a concern: No  Gang Involvement:No   Subjective:  Patient stated, "its kind of been on and off" in response to patient's mood since last session.  Patient stated, "the down times is basically the hardest times for me" in regards to patient's work schedule. Patient reported not being able to see his significant other frequently is a trigger for a decline in mood. Patient reported his significant other recently returned to work and patient does not see significant other as often. Patient stated, "Its kind of mediocre" in response to patient's current mood. Patient reported he feels his life would have been different if patient and significant other had been able to remain married. Patient reported significant other's history of relationships is a barrier to patient engaging in intimacy with significant other. Patient reported he experiences "what if" thoughts related to his significant other returning to work and the possibility of significant  other developing a relationship with someone else.  Patient stated, "that's pretty much it" in response to additional goals for therapy.   Interventions: Motivational Interviewing. Clinician conducted session in person at clinician's office at [facility]. Assessed patient's mood since last session and patient's current mood. Discussed recent decline in mood and contributing factors to recent decline. Clinician utilized motivational interviewing to explore potential goals for therapy. Clinician utilized a task centered approach in collaboration with patient to develop goals for therapy. Patient participated in development of goals and agreed to goals for therapy.   Collaboration of Care: Other not required at this time  Diagnosis:  Major depressive disorder, recurrent episode, moderate (HCC) R/O Alcohol Use Disorder R/O Cannabis Use Disorder   Plan: Patient is to utilize [employer] Therapy, thought re-framing, behavioral activation, and coping strategies to decrease symptoms associated with their diagnosis. Frequency: bi-weekly  Modality: individual     Long-term goal:   Reduce overall level, frequency, and intensity of the feelings of depression as evidenced by depressed mood, loss of interest, fatigue, decreased concentration, difficulty staying asleep, restlessness, psychomotor retardation from 6 to 7 days/week to 0 to 1 days/week per patient report for at least 3 consecutive months. Target Date: 10/12/22  Progress: 0   Short-term goal:  Develop and implement strategies to increase patient's motivation and activity level when patient is not working Target Date: 04/11/23  Progress: 0   Identify, challenge, and re-frame negative thought patterns that contribute to feelings of depression per patient's report Target Date: 04/11/23  Progress: 0  Doree Barthel, LCSW

## 2022-10-18 DIAGNOSIS — M19012 Primary osteoarthritis, left shoulder: Secondary | ICD-10-CM | POA: Diagnosis not present

## 2022-10-18 DIAGNOSIS — M25512 Pain in left shoulder: Secondary | ICD-10-CM | POA: Diagnosis not present

## 2022-10-26 ENCOUNTER — Ambulatory Visit: Payer: PPO | Admitting: Clinical

## 2022-10-26 ENCOUNTER — Other Ambulatory Visit: Payer: Self-pay | Admitting: Orthopedic Surgery

## 2022-10-26 DIAGNOSIS — F331 Major depressive disorder, recurrent, moderate: Secondary | ICD-10-CM

## 2022-10-26 DIAGNOSIS — M25512 Pain in left shoulder: Secondary | ICD-10-CM

## 2022-10-26 NOTE — Progress Notes (Addendum)
[facility] Counselor/Therapist Progress Note  Patient ID: Brandon Lopez, MRN: [id number],    Date: 10/26/2022  Time Spent: 9:37am - 10:32am : 55 minutes   Treatment Type: Individual Therapy  Reported Symptoms: Patient reported depressed mood  Mental Status Exam: Appearance:  Neat and Well Groomed     Behavior: Appropriate  Motor: Normal  Speech/Language:  Clear and Coherent  Affect: Appropriate  Mood: depressed  Thought process: normal  Thought content:   WNL  Sensory/Perceptual disturbances:   WNL  Orientation: oriented to person, place, and situation  Attention: Good  Concentration: Good  Memory: WNL  Fund of knowledge:  Good  Insight:   Fair  Judgment:  Fair  Impulse Control: Fair   Risk Assessment: Danger to Self:  No Patient denied current suicidal ideation  Self-injurious Behavior: No Danger to Others: No Patient denied current homicidal ideation Duty to Warn:no Physical Aggression / Violence:No  Access to Firearms a concern: No  Gang Involvement:No   Subjective: Patient stated, "kind of in and out" in response to patient's mood since last session. Patient stated, "today its kind of down" in response to patient's mood today. 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 Patient reported he enjoys spending time with the residents at patient's apartment complex but reported he does not like living in an apartment. Patient reported he feels he is living at his apartment complex for a reason. Patient reported he woke up late this morning and was not able to see his significant other. Patient reported he does not see his significant other in the evenings due to travel. Patient stated, "I feel like I don't belong there" in reference to spending time at significant other's home. Patient reported difficulty adjusting to significant other returning to work.  Patient stated, "I don't like being by myself". Patient reported he has to initiate communication with his friends and stated, "it feels like its always been up to me". Patient reported he has considered other options as it relates to patient's current relationship and reported difficulty trusting his significant other.   Interventions: Cognitive Behavioral Therapy. Clinician conducted session in person at clinician's office at [facility]. Assessed patient's mood since last session and assessed patient's current mood.  Discussed recent event patient held for the residents at patient's apartment complex and patient's response to the event.  Assisted patient in exploring and identifying triggers for recent decline in patient's mood. Assisted patient in discussing and identifying thoughts/feelings triggered by being in significant other's home and patient's relationship with significant other. Provided psycho education related to depressive symptoms, the importance of self care and participation in enjoyable activities. Explored patient's social network. Provided psycho education related to the use of a gratitude journal. Clinician requested for homework patient complete a gratitude journal.    Collaboration of Care: Other not required at this time   Diagnosis:  Major depressive disorder, recurrent episode, moderate (HCC) R/O Alcohol Use Disorder R/O Cannabis Use Disorder     Plan: Patient is to utilize [employer] Therapy, thought re-framing, behavioral activation, and coping strategies to decrease symptoms associated with their diagnosis. Frequency: bi-weekly  Modality: individual      Long-term goal:   Reduce overall level, frequency, and intensity of the feelings of depression as evidenced by depressed mood, loss of interest, fatigue, decreased concentration, difficulty staying asleep, restlessness, psychomotor retardation from 6 to 7 days/week to 0 to 1 days/week  per patient  report for at least 3 consecutive months. Target Date: 10/12/23  Progress: progressing    Short-term goal:  Develop and implement strategies to increase patient's motivation and activity level when patient is not working Target Date: 04/11/23  Progress: progressing    Identify, challenge, and re-frame negative thought patterns that contribute to feelings of depression per patient's report Target Date: 04/11/23  Progress: progressing                            Doree Barthel, LCSW

## 2022-10-26 NOTE — Progress Notes (Signed)
                Dezi Schaner, LCSW 

## 2022-10-29 ENCOUNTER — Ambulatory Visit
Admission: RE | Admit: 2022-10-29 | Discharge: 2022-10-29 | Disposition: A | Discharge status: Home / Self Care | Payer: PPO | Source: Ambulatory Visit | Attending: Orthopedic Surgery

## 2022-10-29 ENCOUNTER — Ambulatory Visit
Admission: RE | Admit: 2022-10-29 | Discharge: 2022-10-29 | Disposition: A | Discharge status: Home / Self Care | Payer: PPO | Source: Ambulatory Visit | Attending: Orthopedic Surgery | Admitting: Orthopedic Surgery

## 2022-10-29 DIAGNOSIS — M25512 Pain in left shoulder: Secondary | ICD-10-CM

## 2022-10-29 MED ORDER — LIDOCAINE HCL (PF) 1 % IJ SOLN
10.0000 mL | Freq: Once | INTRAMUSCULAR | Status: AC
  Administered 2022-10-29: 4 mL via INTRADERMAL
  Filled 2022-10-29: qty 10

## 2022-10-29 MED ORDER — SODIUM CHLORIDE (PF) 0.9% IJ SOLUTION - NO CHARGE
20.0000 mL | INTRAMUSCULAR | Status: DC | PRN
  Administered 2022-10-29: 5 mL via INTRAVENOUS
  Filled 2022-10-29 (×2): qty 20

## 2022-10-29 MED ORDER — IOHEXOL 180 MG/ML  SOLN
20.0000 mL | Freq: Once | INTRAMUSCULAR | Status: AC | PRN
  Administered 2022-10-29: 15 mL via INTRATHECAL

## 2022-11-09 ENCOUNTER — Ambulatory Visit: Payer: PPO | Admitting: Clinical

## 2022-11-09 DIAGNOSIS — F331 Major depressive disorder, recurrent, moderate: Secondary | ICD-10-CM

## 2022-11-09 NOTE — Progress Notes (Signed)
                Dezi Schaner, LCSW 

## 2022-11-09 NOTE — Progress Notes (Signed)
[facility] Counselor/Therapist Progress Note  Patient ID: Brandon Lopez, MRN: [id number],    Date: 11/09/2022  Time Spent: 9:37am - 10:31am : 54 minutes   Treatment Type: Individual Therapy  Reported Symptoms: Patient reported depressed mood  Mental Status Exam: Appearance:  Neat and Well Groomed     Behavior: Appropriate  Motor: Normal  Speech/Language:  Clear and Coherent  Affect: Flat  Mood: depressed  Thought process: circumstantial  Thought content:   WNL  Sensory/Perceptual disturbances:   WNL  Orientation: oriented to person, place, and situation  Attention: Good  Concentration: Good  Memory: WNL  Fund of knowledge:  Good  Insight:   Fair  Judgment:  Good  Impulse Control: Fair   Risk Assessment: Danger to Self:  No Patient denied current suicidal ideation  Self-injurious Behavior: No Danger to Others: No Patient denied current homicidal ideation Duty to Warn:no Physical Aggression / Violence:No  Access to Firearms a concern: No  Gang Involvement:No   Subjective: Patient reported he has been experiencing pain in his shoulder today and reported he had a CT scan on his shoulder two weeks ago to determine the cause of patient's shoulder pain. Patient stated, "not in a good mood" in response to patient's mood this morning. Patient reported he has not received a response regarding patient's CT results and reported he is "feeling down" as a result.  Patient stated, "I normally just put it out of my mind and do what I've got to do". Patient reported concern about the shoulder pain and completing the repair work he has planned for tomorrow. Patient stated, "its been one thing after another" in response to triggers for decline in mood. Patient identified shoulder pain, changes in his dog's health, significant other returning to work, significant other's recent conflict with a co-worker, and limited time with significant other as triggers for decline in  mood. Patient stated, "the first day it went alright but after that I got consumed in a bunch of other stuff" in response to patient's gratitude journal. Patient reported he noted spending the day with his significant other and going to lunch with significant other at a local favorite restaurant in his gratitude journal. Patient stated, "I have more good days than I have bad days" in response to patient's mood.   Interventions: Cognitive Behavioral Therapy. Clinician conducted session in person at clinician's office at [facility]. Reviewed events since last session. Assessed patient's mood since last session and assessed current mood. Assisted patient in exploring and identifying triggers for decline in mood today. Reviewed patient's gratitude journal and discussed barriers to completing gratitude journal. Provided psycho education related to gratitude exercises. Clinician requested for homework patient complete a gratitude journal and identify at least one area of gratitude each day.    Collaboration of Care: Other not required at this time   Diagnosis:  Major depressive disorder, recurrent episode, moderate (HCC) R/O Alcohol Use Disorder R/O Cannabis Use Disorder     Plan: Patient is to utilize [employer] Therapy, thought re-framing, behavioral activation, and coping strategies to decrease symptoms associated with their diagnosis. Frequency: bi-weekly  Modality: individual      Long-term goal:   Reduce overall level, frequency, and intensity of the feelings of depression as evidenced by depressed mood, loss of interest, fatigue, decreased concentration, difficulty staying asleep, restlessness, psychomotor retardation from 6 to 7 days/week to 0 to 1 days/week per patient report for at least 3 consecutive months. Target Date: 10/12/23  Progress: progressing  Short-term goal:  Develop and implement strategies to increase patient's motivation and activity level when patient  is not working Target Date: 04/11/23  Progress: progressing    Identify, challenge, and re-frame negative thought patterns that contribute to feelings of depression per patient's report Target Date: 04/11/23  Progress: progressing                        Doree Barthel, LCSW

## 2022-11-12 DIAGNOSIS — M19012 Primary osteoarthritis, left shoulder: Secondary | ICD-10-CM | POA: Diagnosis not present

## 2022-11-17 ENCOUNTER — Ambulatory Visit: Payer: PPO | Admitting: Primary Care

## 2022-11-17 ENCOUNTER — Encounter: Payer: Self-pay | Admitting: Primary Care

## 2022-11-17 VITALS — BP 124/76 | HR 88 | Temp 97.2°F | Ht 65.0 in | Wt 194.0 lb

## 2022-11-17 DIAGNOSIS — R21 Rash and other nonspecific skin eruption: Secondary | ICD-10-CM | POA: Insufficient documentation

## 2022-11-17 DIAGNOSIS — M19012 Primary osteoarthritis, left shoulder: Secondary | ICD-10-CM | POA: Insufficient documentation

## 2022-11-17 DIAGNOSIS — Z23 Encounter for immunization: Secondary | ICD-10-CM | POA: Diagnosis not present

## 2022-11-17 MED ORDER — TRIAMCINOLONE ACETONIDE 0.1 % EX CREA: 1.0000 | TOPICAL_CREAM | Freq: Two times a day (BID) | CUTANEOUS | 0 refills | Status: DC | PRN

## 2022-11-17 NOTE — Progress Notes (Signed)
Acute Office Visit  Subjective:     Patient ID: Brandon Lopez, male    DOB: 1955-12-06, 67 y.o.   MRN: [id number]  Chief Complaint  Patient presents with   Rash    Rash on inner lower legs. X1 month    Rash Associated symptoms include joint pain. Pertinent negatives include no diarrhea, fever, shortness of breath or vomiting.    Red Holiday is a very pleasant 67 y.o. male with a history of senile purpura, pre-cancerous skin lesions, hypertension, paroxysmal atrial fibrillation, COPD, OSA, osteoarthritis, alcohol abuse, chronic back pain who presents today to discuss rash.  He first noticed a rash on the medial surface of bilateral lower extremities proximal to the ankle 1 month ago. He denies itching and pain. He walks his dog by the pond but keeps the grass cut short. He has not been in the words. He denies any changes to medications, soaps, detergents, lotions. He is not aware of any tick bites. He is scheduled to see Dermatology again in March 2025 but wanted to be evaluated sooner for rash.   Additionally, he wanted to discuss his chronic left shoulder pain. He has severe osteoarthritis in this shoulder and follows with [facility], which is out of network for him. He has received 3 cortisone injections in his left shoulder this year with no relief. He would like a referral for a second opinion with an in-network orthopedic provider.    Review of Systems  Constitutional:  Negative for chills and fever.  Eyes:  Negative for blurred vision.  Respiratory:  Negative for shortness of breath.   Cardiovascular:  Negative for chest pain.  Gastrointestinal:  Negative for diarrhea, nausea and vomiting.  Musculoskeletal:  Positive for joint pain and myalgias.  Skin:  Positive for itching and rash.  Neurological:  Negative for dizziness and headaches.  Psychiatric/Behavioral:  The patient is not nervous/anxious.       Objective:    BP 124/76   Pulse 88   Temp  (!) 97.2 F (36.2 C) (Temporal)   Ht 5\' 5"  (1.651 m)   Wt 194 lb (88 kg)   SpO2 98%   BMI 32.28 kg/m   BP Readings from Last 3 Encounters:  11/17/22 124/76  09/07/22 128/76  08/31/22 124/80   Wt Readings from Last 3 Encounters:  11/17/22 194 lb (88 kg)  09/07/22 195 lb 3.2 oz (88.5 kg)  08/31/22 195 lb 3.2 oz (88.5 kg)    Physical Exam Cardiovascular:     Rate and Rhythm: Normal rate and regular rhythm.     Heart sounds: Normal heart sounds.  Pulmonary:     Effort: Pulmonary effort is normal.  Skin:    General: Skin is warm and dry.     Findings: Rash present.     Comments: Bilateral lower extremities, medial surface proximal to the ankle. Flat, mildly scaly. No surrounding erythema.   Neurological:     General: No focal deficit present.     Mental Status: He is alert.    No results found for any visits on 11/17/22.      Assessment & Plan:   Problem List Items Addressed This Visit       Musculoskeletal and Integument   Osteoarthritis of glenohumeral joint, left    Ongoing.  Referral placed to in-network Ortho for 2nd opinion.       Relevant Orders   Ambulatory referral to Orthopedic Surgery   Rash and nonspecific skin eruption - Primary  Unclear etiology.   Exam not indicative of cellulitis.   Trial topical steroid.  Start triamcinolone cream 0.1% BID for 1 week, then PRN.   He will update via MyChart.       Relevant Medications   triamcinolone cream (KENALOG) 0.1 %   Other Visit Diagnoses     Encounter for immunization       Relevant Orders   Flu Vaccine Trivalent High Dose (Fluad) (Completed)       Meds ordered this encounter  Medications   triamcinolone cream (KENALOG) 0.1 %    Sig: Apply 1 Application topically 2 (two) times daily as needed. For rash.    Dispense:  30 g    Refill:  0    No follow-ups on file.  Benito Mccreedy, RN

## 2022-11-17 NOTE — Assessment & Plan Note (Addendum)
Ongoing.  Referral placed to in-network Ortho for 2nd opinion.  Reviewed CT of left shoulder from October 2024.  I evaluated patient, was consulted regarding treatment, and agree with assessment and plan per Tenna Delaine, RN, DNP student.   Mayra Reel, NP-C

## 2022-11-17 NOTE — Patient Instructions (Addendum)
You will either be contacted via phone regarding your referral to Orthopedics, or you may receive a letter on your MyChart portal from our referral team with instructions for scheduling an appointment. Please let us know if you have not been contacted by anyone within two weeks.  You can apply the triamcinolone cream 2 times per day for about 1 week, then as needed.   It was a pleasure to see you today!

## 2022-11-17 NOTE — Progress Notes (Signed)
Subjective:    Patient ID: Brandon Lopez, male    DOB: December 14, 1955, 67 y.o.   MRN: [id number]  Rash Pertinent negatives include no fever.    Brandon Lopez is a very pleasant 67 y.o. male with a history of hypertension, paroxysmal atrial fibrillation, OSA, COPD, alcohol abuse, senile purpura, hyperlipidemia who presents today to discuss rash.   1) Rash: Symptom onset 1 month ago with a rash to the bilateral medial lower extremities, proximal to the ankles. He denies itching, pain, changes in size or shape.   No new lotions, detergents, soaps or shampoos. No new medicines, vitamins, supplements. No new pets. No recent outdoor exposure or poison ivy exposure. No bonfire or smoke exposure.  No recent motel or hotel stay or new beds.   No fevers/chills, oral lesions, new joint pains, tick bites, abdominal pain, nausea.   2) Shoulder Pain: Chronic to the left shoulder. Following with orthopedics, has received 3 injections this year thus far, doesn't feel that the injections have helped. He underwent a CT of his shoulder in October 2024 which revealed severe osteoarthritis without rotator cuff tear, and mild AC joint osteoarthritis.  He questions if his Breztri inhaler is preventing the steroid injections from being effective. His current orthopedist is out of network, is looking for a second opinion within the [employer].    Review of Systems  Constitutional:  Negative for fever.  Musculoskeletal:  Positive for arthralgias.  Skin:  Positive for rash.         Past Medical History:  Diagnosis Date   Actinic keratosis    Arthritis    Chronic systolic CHF (congestive heart failure) (HCC)    a. echo 6/16: EF 55-60%, Gr1DD, LA nl, PASP nl; b. echo 7/19: EF 40-45%, unable to exclude RWMA, Gr1DD, LA nl, RVSF nl, PASP nl   COPD (chronic obstructive pulmonary disease) (HCC)    Depression    Diverticulosis    Found during colonoscopy   Edema 07/21/2016   History  of kidney stones    Hypertension    Left facial swelling 12/11/2021   Left lower quadrant abdominal pain 10/27/2021   Lightheadedness 04/21/2021   PAF (paroxysmal atrial fibrillation) (HCC)    a. short episodes previously noted on PPM interrogation; b. started on Xarelto 07/2017 given further episodes of Afib noted during interrogation; c. 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 Biotronik; Henrietta Hoover DR-T [id number]; placed 08/2012   Shortness of breath 04/21/2021   Sleep apnea    does not use CPAP   Symptomatic bradycardia    a. s/p Biotronik; Henrietta Hoover DR-T [id number]; placed 08/2012 at [facility]   Syncope 08/10/2017    Social History   Socioeconomic History   Marital status: Single    Spouse name: Not on file   Number of children: Not on file   Years of education: Not on file   Highest education level: GED or equivalent  Occupational History   Not on file  Tobacco Use   Smoking status: Former    Current packs/day: 0.00    Average packs/day: 2.0 packs/day for 59.0 years (118.0 ttl pk-yrs)    Types: Cigarettes    Start date: 38    Quit date: 02/01/2021    Years since quitting: 1.7   Smokeless tobacco: Never   Tobacco comments:    started smoking at  age 43, 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  Vaping Use   Vaping status: Never Used  Substance and Sexual Activity   Alcohol use: Yes    Alcohol/week: 8.0 - 14.0 standard drinks of alcohol    Types: 8 - 14 Cans of beer per week    Comment: daily beer   Drug use: Yes    Types: Marijuana    Comment: states smokes daily, last 11-09-21   Sexual activity: Yes  Other Topics Concern   Not on file  Social History Narrative   Not on file   Social Determinants of Health   Financial Resource Strain: Low Risk  (11/13/2022)   Overall Financial Resource Strain (CARDIA)    Difficulty of Paying Living Expenses: Not hard at all  Food  Insecurity: Unknown (11/13/2022)   Hunger Vital Sign    Worried About Running Out of Food in the Last Year: Patient declined    Ran Out of Food in the Last Year: Never true  Transportation Needs: No Transportation Needs (11/13/2022)   PRAPARE - Administrator, Civil Service (Medical): No    Lack of Transportation (Non-Medical): No  Physical Activity: Insufficiently Active (11/13/2022)   Exercise Vital Sign    Days of Exercise per Week: 4 days    Minutes of Exercise per Session: 20 min  Stress: Stress Concern Present (11/13/2022)   [employer] of Occupational Health - Occupational Stress Questionnaire    Feeling of Stress : To some extent  Social Connections: Socially Isolated (11/13/2022)   Social Connection and Isolation Panel [NHANES]    Frequency of Communication with Friends and Family: Never    Frequency of Social Gatherings with Friends and Family: More than three times a week    Attends Religious Services: Never    Database administrator or Organizations: No    Attends Banker Meetings: Never    Marital Status: Divorced  Catering manager Violence: Not At Risk (07/28/2022)   Humiliation, Afraid, Rape, and Kick questionnaire    Fear of Current or Ex-Partner: No    Emotionally Abused: No    Physically Abused: No    Sexually Abused: No    Past Surgical History:  Procedure Laterality Date   APPENDECTOMY  age 15   CARDIAC CATHETERIZATION     7 years ago   COLONOSCOPY N/A 05/27/2014   Procedure: COLONOSCOPY;  Surgeon: Wallace Cullens, MD;  Location: [facility];  Service: Gastroenterology;  Laterality: N/A;   DRUG INDUCED ENDOSCOPY Bilateral 11/11/2021   Procedure: DRUG INDUCED ENDOSCOPY;  Surgeon: Christia Reading, MD;  Location: [facility];  Service: ENT;  Laterality: Bilateral;   FLOOR OF MOUTH BIOPSY N/A 01/05/2022   Procedure: BIOPSY OF PALATE;  Surgeon: Christia Reading, MD;  Location: [facility];  Service: ENT;   Laterality: N/A;   IMPLANTATION OF HYPOGLOSSAL NERVE STIMULATOR Left 01/05/2022   Procedure: IMPLANTATION OF HYPOGLOSSAL NERVE STIMULATOR;  Surgeon: Christia Reading, MD;  Location: [facility];  Service: ENT;  Laterality: Left;   INSERT / REPLACE / REMOVE PACEMAKER     3 years ago    Family History  Problem Relation Age of Onset   Arthritis Mother    Heart disease Father    Hyperlipidemia Father    Hypertension Father    Cancer Paternal Grandfather     Allergies  Allergen Reactions   Simvastatin Diarrhea   Lisinopril Cough    Current Outpatient Medications on File Prior to Visit  Medication  Sig Dispense Refill   albuterol (VENTOLIN HFA) 108 (90 Base) MCG/ACT inhaler INHALE 2 PUFFS BY MOUTH EVERY 6 HOURS AS NEEDED FOR WHEEZING AND FOR SHORTNESS OF BREATH 18 g 0   aspirin EC 81 MG tablet Take 81 mg by mouth daily. Swallow whole.     atorvastatin (LIPITOR) 20 MG tablet TAKE 1 TABLET BY MOUTH ONCE DAILY FOR CHOLESTEROL 90 tablet 2   Budeson-Glycopyrrol-Formoterol (BREZTRI AEROSPHERE) 160-9-4.8 MCG/ACT AERO Inhale 2 puffs into the lungs 2 (two) times daily. 10.7 g 11   buPROPion (WELLBUTRIN SR) 150 MG 12 hr tablet Take 150 mg by mouth 2 (two) times daily.     carvedilol (COREG) 3.125 MG tablet Take 1 tablet by mouth twice daily 180 tablet 2   ezetimibe (ZETIA) 10 MG tablet Take 1 tablet (10 mg total) by mouth daily. 90 tablet 3   folic acid (FOLVITE) 1 MG tablet Take 1 tablet by mouth once daily 90 tablet 0   tadalafil (CIALIS) 5 MG tablet TAKE 1 TABLET BY MOUTH ONCE DAILY FOR  FREQUENT  URINATION 90 tablet 3   telmisartan (MICARDIS) 20 MG tablet Take 1 tablet by mouth once daily for blood pressure 90 tablet 3   Eszopiclone 3 MG TABS TAKE 1 TABLET BY MOUTH ONCE DAILY AT BEDTIME (TAKE  IMMEDIATELY  BEFORE  BEDTIME) (Patient not taking: Reported on 11/17/2022) 30 tablet 5   No current facility-administered medications on file prior to visit.    BP 124/76   Pulse 88    Temp (!) 97.2 F (36.2 C) (Temporal)   Ht 5\' 5"  (1.651 m)   Wt 194 lb (88 kg)   SpO2 98%   BMI 32.28 kg/m  Objective:   Physical Exam Cardiovascular:     Rate and Rhythm: Normal rate.  Pulmonary:     Effort: Pulmonary effort is normal.  Skin:    General: Skin is warm and dry.     Findings: Rash present.     Comments: Area of mild scaling and mild redness, without surrounding erythema. No open or weeping wounds.   Neurological:     Mental Status: He is alert.           Assessment & Plan:  Rash and nonspecific skin eruption Assessment & Plan: Unclear etiology. Most representative of eczema.  Exam not indicative of cellulitis or cancerous growth.   Trial topical steroid.  Start triamcinolone cream 0.1% BID for 1 week, then PRN.   He will update via MyChart.   I evaluated patient, was consulted regarding treatment, and agree with assessment and plan per Tenna Delaine, RN, DNP student.   Mayra Reel, NP-C   Orders: -     Triamcinolone Acetonide; Apply 1 Application topically 2 (two) times daily as needed. For rash.  Dispense: 30 g; Refill: 0  Osteoarthritis of glenohumeral joint, left Assessment & Plan: Ongoing.  Referral placed to in-network Ortho for 2nd opinion.  Reviewed CT of left shoulder from October 2024.  I evaluated patient, was consulted regarding treatment, and agree with assessment and plan per Tenna Delaine, RN, DNP student.   Mayra Reel, NP-C   Orders: -     Ambulatory referral to Orthopedic Surgery  Encounter for immunization -     Flu Vaccine Trivalent High Dose (Fluad)        Doreene Nest, NP

## 2022-11-17 NOTE — Assessment & Plan Note (Addendum)
Unclear etiology. Most representative of eczema.  Exam not indicative of cellulitis or cancerous growth.   Trial topical steroid.  Start triamcinolone cream 0.1% BID for 1 week, then PRN.   He will update via MyChart.   I evaluated patient, was consulted regarding treatment, and agree with assessment and plan per Tenna Delaine, RN, DNP student.   Mayra Reel, NP-C

## 2022-11-20 NOTE — Progress Notes (Signed)
HPI M former smoker followed for OSA, COPD (100 pkyrs, QS [id number]), Asbestos exposure, complicated by CHF/Pacemakert,, HTN, PAFib, COPD, HxTobacco Use, OSA, Diverticulosis, Osteoarthritis, ETOH, Anxiety/Depression, Hx Positive TB PPD,  -Ventolin hfa, Breztri,  NPSG [facility] 09/10/15- AHI 44.3/ hr, desaturation to 83%, body weight 184 lbs, CPAP to 10 Inspire Hypoglossal Nerve Stimulator 01/05/22-Dr Genevie Cheshire Activation 02/09/22  =======================================================   08/31/22-66 yoM former smoker followed for OSA, COPD (100 pkyrs, QS [id number]), Asbestos exposure, complicated by CHF/Pacemaker, ASCVD, HTN, PAFib, CAD, HxTobacco Use, OSA, Diverticulosis, Osteoarthritis, ETOH, Anxiety/Depression, Hx Positive TB PPD,  He had installed and removed asbestos and he had worked in [employer] in addition to a long smoking history. -Ventolin Felecia Shelling, Lunesta, CT chest low dose screen/ Asbestos exposure-  06/26/22  Inspire Hypoglossal Nerve Stimulator placed 01/05/22-Dr Genevie Cheshire Activation 02/09/22 Inspire Sleep  Study Titration-05/18/22- Body weight today-195 lbs Currently on Level 2 (0.7 V).  He is comfortable with this voltage.  He reports waking every 2 hours for a few minutes.  If he naps he feels worse and wakes more at night so he avoids daytime naps.  Average use of his Earnest Bailey is about 6 hours.  He has tried [place] which sometimes seems to carry over too long into the morning. We discussed cutting back on Lunesta and/or may be trying an OTC med. We discussed moving bedtime a little later-maybe 11:30 PM-to see if that would reduce some of the waking episodes at night. Inspire technician is increasing the pause time to 15 minutes. Denies chest pain, change in cough or respiratory distress.  11/23/22- 67 yoM Smoker(118 pk yrs) followed for OSA, COPD (100 pkyrs, QS [id number]), Asbestos exposure, complicated by CHF/Pacemaker, ASCVD, HTN, PAFib, CAD, HxTobacco Use, OSA,  Diverticulosis, Osteoarthritis, ETOH, Anxiety/Depression, Hx Positive TB PPD,  He had installed and removed asbestos and he had worked in [employer], in addition to a long smoking history. -Ventolin hfa, Breztri, Temazepam, CT chest low dose screen/ Asbestos exposure-   last on 06/26/22 Inspire Hypoglossal Nerve Stimulator placed 01/05/22-Dr Genevie Cheshire Activation 02/09/22 Inspire Sleep  Study Titration-05/18/22- Body weight today-192 lbs -----Breathing has been ok  Smoking 1 ppd now. Had flu vax. He report feeling most refreshed after sleeping at 1.2V, level 7.  He's going to try temazepam again as sleep aid. Unisom made him dizzy. Inspire tech recommends he try to advance 1 level/ week or 2 weeks, seeking to get him into the voltage range that gave best control on his sleep study, which was at 1.7-1.8 volts.  ROS-see HPI   + = positive Constitutional:    weight loss, night sweats, fevers, chills, fatigue, lassitude. HEENT:    headaches, difficulty swallowing, tooth/dental problems, sore throat,       sneezing, itching, ear ache, nasal congestion, post nasal drip, snoring CV:    chest pain, orthopnea, PND, swelling in lower extremities, anasarca,                                   dizziness, palpitations Resp:   +shortness of breath with exertion or at rest.                productive cough,   non-productive cough, coughing up of blood.              change in color of mucus.  wheezing.   Skin:    rash or lesions. GI:  +heartburn, indigestion, abdominal  pain, nausea, vomiting, diarrhea,                 change in bowel habits, +loss of appetite GU: dysuria, change in color of urine, no urgency or frequency.   flank pain. MS:   joint pain, stiffness, decreased range of motion, back pain. Neuro-     nothing unusual Psych:  change in mood or affect.  depression or anxiety.   memory loss.  OBJ- Physical Exam   +overweight General- Alert, Oriented, Affect-appropriate, Distress- none Skin-  rash-none, lesions- none, excoriation- none Lymphadenopathy- none Head- atraumatic            Eyes- Gross vision intact, PERRLA, conjunctivae and secretions clear            Ears- Hearing, canals-normal            Nose- Clear, no-Septal dev, mucus, polyps, erosion, perforation             Throat- Mallampati II , mucosa clear , drainage- none, tonsils- atrophic Neck- flexible , trachea midline, no stridor , thyroid nl, carotid no bruit Chest - symmetrical excursion , unlabored           Heart/CV- RRR , no murmur , no gallop  , no rub, nl s1 s2                           - JVD- none , edema- none, stasis changes- none, varices- none           Lung- +  no rhonchi, no distinct crackle,  wheeze+ slight, cough+light , dullness-none, rub- none           Chest wall- +pacemaker on right. + Inspire on left Abd-  Br/ Gen/ Rectal- Not done, not indicated Extrem- cyanosis- none, clubbing, none, atrophy- none, strength- nl Neuro- grossly intact to observatio

## 2022-11-23 ENCOUNTER — Ambulatory Visit: Payer: PPO | Admitting: Clinical

## 2022-11-23 ENCOUNTER — Ambulatory Visit: Payer: PPO | Admitting: Internal Medicine

## 2022-11-23 ENCOUNTER — Encounter: Payer: Self-pay | Admitting: Internal Medicine

## 2022-11-23 VITALS — BP 106/62 | HR 60 | Ht 65.0 in | Wt 192.8 lb

## 2022-11-23 DIAGNOSIS — F331 Major depressive disorder, recurrent, moderate: Secondary | ICD-10-CM

## 2022-11-23 DIAGNOSIS — G4733 Obstructive sleep apnea (adult) (pediatric): Secondary | ICD-10-CM

## 2022-11-23 DIAGNOSIS — F5101 Primary insomnia: Secondary | ICD-10-CM | POA: Diagnosis not present

## 2022-11-23 DIAGNOSIS — F1010 Alcohol abuse, uncomplicated: Secondary | ICD-10-CM | POA: Diagnosis not present

## 2022-11-23 DIAGNOSIS — Z720 Tobacco use: Secondary | ICD-10-CM

## 2022-11-23 DIAGNOSIS — Z77090 Contact with and (suspected) exposure to asbestos: Secondary | ICD-10-CM

## 2022-11-23 NOTE — Patient Instructions (Signed)
Step up Inspire as discussed today  0.7 to 1.3 - gradually as tolerated. We don't want you to be uncomfortable.  Try temazepam as a sleep medicine at bedtime, instead of Unisom. See if it helps you sleep better. If you need more, let me know.  Please try to cut down your smoking!

## 2022-11-23 NOTE — Progress Notes (Signed)
                Dezi Schaner, LCSW 

## 2022-11-23 NOTE — Progress Notes (Signed)
[facility] Counselor/Therapist Progress Note  Patient ID: Brandon Lopez, MRN: [id number],    Date: 11/23/2022  Time Spent: 9:32am - 10:29am : 57 minutes   Treatment Type: Individual Therapy  Reported Symptoms: depressed mood  Mental Status Exam: Appearance:  Neat and Well Groomed     Behavior: Appropriate  Motor: Normal  Speech/Language:  Clear and Coherent  Affect: Flat  Mood: depressed  Thought process: normal  Thought content:   WNL  Sensory/Perceptual disturbances:   WNL  Orientation: oriented to person, place, and situation  Attention: Good  Concentration: Good  Memory: WNL  Fund of knowledge:  Good  Insight:   Fair  Judgment:  Good  Impulse Control: Good   Risk Assessment: Danger to Self:  No Patient denied current suicidal ideation  Self-injurious Behavior: No Danger to Others: No Patient denied current homicidal ideation Duty to Warn:no Physical Aggression / Violence:No  Access to Firearms a concern: No  Gang Involvement:No   Subjective: Patient stated, "pretty good some what" in response to events since last session. Patient reported he received a cortisone shot in his shoulder and patient reported he was advised he has arthritis in his shoulder. Patient reported he has an appointment in November with another orthopedic physician for a second opinion. Patient stated, "my depression kind of comes and goes", "most of the time its not an everyday thing". Patient reported November through  January are "worst time of the year for me" and reported missing his daughter and his family during those months. Patient reported he does not agree with daughter's husband and reported he feels daughter's husband is a barrier to patient visiting with his daughter. Patient reported he has not seen his daughter or grandson in 4-5 years. Patient reported his son in law "tried to tell me how to live my life" and patient asked son in law to leave his home in  response. Patient stated, "I just don't know what to do about it" in reference to patient's relationship with his daughter. Patient stated, "every time I've tried to reach out to her (daughter) its gotten worse not better". Patient stated, "I wouldn't feel too good about it" in response to writing daughter a letter and reported concern daughter would share the letter with her husband. Patient reported thoughts about the relationship with his current significant other and the relationship with his daughter are triggers for depression. Patient reported depressed mood today.  Patient reported he is thankful he woke up this morning, thankful for Diane (significant other), and thankful for dog, Lily.    Interventions: Cognitive Behavioral Therapy and Interpersonal. Clinician conducted session in person at clinician's office at [facility]. Reviewed events since last session. Reviewed the status of patient's recent health concerns. Discussed patient's relationship with his daughter, assisted patient in exploring and identifying barriers to maintaining a relationship with patient's daughter. Assisted patient is discussing and identifying triggers for depression during months of November through January. Discussed patient writing his daughter a Physicist, medical. Utilized cognitive restructuring techniques to reframe dysfunctional thinking. Assessed patient's mood. Provided psycho education related to communication and gratitude journal. Reviewed patient's gratitude journal and assisted patient in exploring additional areas of gratitude. Clinician requested for homework patient continue gratitude journal and compile list of pros/cons regarding patient's relationship with daughter.    Collaboration of Care: Other not required at this time   Diagnosis:  Major depressive disorder, recurrent episode, moderate (HCC) R/O Alcohol Use Disorder R/O Cannabis Use Disorder  Plan: Patient is to utilize [employer]  Therapy, thought re-framing, behavioral activation, and coping strategies to decrease symptoms associated with their diagnosis. Frequency: bi-weekly  Modality: individual      Long-term goal:   Reduce overall level, frequency, and intensity of the feelings of depression as evidenced by depressed mood, loss of interest, fatigue, decreased concentration, difficulty staying asleep, restlessness, psychomotor retardation from 6 to 7 days/week to 0 to 1 days/week per patient report for at least 3 consecutive months. Target Date: 10/12/23  Progress: progressing    Short-term goal:  Develop and implement strategies to increase patient's motivation and activity level when patient is not working Target Date: 04/11/23  Progress: progressing    Identify, challenge, and re-frame negative thought patterns that contribute to feelings of depression per patient's report Target Date: 04/11/23  Progress: progressing              Doree Barthel, LCSW

## 2022-11-26 ENCOUNTER — Encounter: Payer: Self-pay | Admitting: Internal Medicine

## 2022-11-26 DIAGNOSIS — Z720 Tobacco use: Secondary | ICD-10-CM | POA: Insufficient documentation

## 2022-11-26 NOTE — Assessment & Plan Note (Signed)
Need to advance voltage toward 1.8 for better control if he can tolerate. Plan- build voltage as tolerated. Ok to use temazepam.

## 2022-11-26 NOTE — Assessment & Plan Note (Signed)
Smoking cessation effort encouraged. He needss to decide he wants to quit.

## 2022-11-26 NOTE — Assessment & Plan Note (Signed)
He sometimes admits heavier use, likely contributing to his insomnia.

## 2022-11-26 NOTE — Assessment & Plan Note (Signed)
Trying temazepam again

## 2022-11-26 NOTE — Assessment & Plan Note (Signed)
Continuing low dose CT screening program. Next due June of 2025.

## 2022-12-01 ENCOUNTER — Encounter: Payer: Self-pay | Admitting: Orthopedic Surgery

## 2022-12-01 ENCOUNTER — Ambulatory Visit: Payer: PPO | Admitting: Orthopedic Surgery

## 2022-12-01 DIAGNOSIS — M19012 Primary osteoarthritis, left shoulder: Secondary | ICD-10-CM

## 2022-12-01 NOTE — Progress Notes (Signed)
Office Visit Note   Patient: Brandon Lopez           Date of Birth: 09/24/1955           MRN: [id number] Visit Date: 12/01/2022 Requested by: Doreene Nest, NP [address] [place],  [place] [address] PCP: Doreene Nest, NP  Subjective: Chief Complaint  Patient presents with   Left Shoulder - Pain    HPI: Brandon Lopez is a 67 y.o. male who presents to the office reporting left shoulder pain which has been chronic for several years.  Denies any history of injury.  Patient states the pain wakes him from sleep at night.  Does have good and bad days.  Denies any neck pain or radicular pain.  He has worked in [employer] his whole life.  Describes decreased range of motion due to the pain in that left shoulder.  Does not take much in the way of medication for pain because he states "I just deal with it".  Patient states he does drink beer daily like some people drink tea.  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  Patient is a smoker and has hypertension.  Has had injections none ultrasound-guided into the shoulder x 2 this year.  Last injection was a month ago.  He has occasional pain in the right shoulder but the left shoulder hurts him all the time.  Pain is worse with overhead motion.  Currently he is in the middle of a kitchen remodel.  Patient also has a pacemaker..                ROS: All systems reviewed are negative as they relate to the chief complaint within the history of present illness.  Patient denies fevers or chills.  Assessment & Plan: Visit Diagnoses:  1. Arthritis of left shoulder region     Plan: Impression is left shoulder arthritis with intact rotator cuff and mild AC joint arthritis.  CT scan is reviewed.  He does have some limited range of motion.  Overall he is managing fairly well with his current symptoms.  I do think it would be diagnostically and therapeutically helpful for him to undergo an ultrasound-guided glenohumeral  joint injection in 2 months which would be 3 months after his prior shot.  He is not quite bad enough yet for shoulder replacement but that is likely in his future based on his diminished motion and pain.  Follow-up in 2 months for injection.  Follow-Up Instructions: No follow-ups on file.   Orders:  No orders of the defined types were placed in this encounter.  No orders of the defined types were placed in this encounter.     Procedures: No procedures performed   Clinical Data: No additional findings.  Objective: Vital Signs: There were no vitals taken for this visit.  Physical Exam:  Constitutional: Patient appears well-developed HEENT:  Head: Normocephalic Eyes:EOM are normal Neck: Normal range of motion Cardiovascular: Normal rate Pulmonary/chest: Effort normal Neurologic: Patient is alert Skin: Skin is warm Psychiatric: Patient has normal mood and affect  Ortho Exam: Ortho exam demonstrates range of motion on the left a 15/80/130 range of motion on the right is 45/90/165.  Rotator cuff strength is slightly weak in external rotation and internal rotation on the left.  Likely due to pain.  No discrete AC joint tenderness on the left-hand side.  Does have some coarseness with passive range of motion consistent with  his known diagnosis of severe left shoulder arthritis.  Specialty Comments:  No specialty comments available.  Imaging: No results found.   PMFS History: Patient Active Problem List   Diagnosis Date Noted   Tobacco abuse 11/26/2022   Osteoarthritis of glenohumeral joint, left 11/17/2022   Rash and nonspecific skin eruption 11/17/2022   Bilateral lower extremity edema 08/03/2022   Senile purpura (HCC) 07/16/2022   Eye irritation 07/16/2022   Insomnia 05/18/2022   GERD (gastroesophageal reflux disease) 01/12/2022   Oral lesion 12/11/2021   Diverticulitis of descending colon 10/27/2021   Aortic atherosclerosis (HCC) 10/27/2021   DDD (degenerative disc  disease), lumbar 10/27/2021   Lower abdominal pain 07/13/2021   High blood monocyte count 06/17/2021   Laceration of right ring finger with damage to nail without foreign body 06/17/2021   Hyperglycemia 06/17/2021   Hypotension due to drugs 06/17/2021   Nocturia 06/17/2021   DOE (dyspnea on exertion) 06/17/2021   Weak urinary stream 06/02/2021   Asbestos exposure 05/29/2021   Other chest pain 04/21/2021   Other fatigue 04/21/2021   Nocturia more than twice per night 04/09/2021   Erectile dysfunction 04/09/2021   Skin lesions 07/03/2020   Chronic back pain 08/17/2019   Chronic shoulder pain 12/12/2018   Rectal bleeding 09/19/2018   Paroxysmal atrial fibrillation (HCC) 10/31/2017   Osteoarthritis 03/17/2017   Diverticulosis 12/16/2014   COPD mixed type (HCC) 11/26/2014   Cervical pain (neck) 10/14/2014   Hyperlipidemia 07/11/2014   Encounter for annual general medical examination with abnormal findings in adult 07/11/2014   Pacemaker 06/03/2014   Anxiety and depression 06/03/2014   Alcohol abuse 06/03/2014   OSA (obstructive sleep apnea) 06/03/2014   Essential hypertension 06/03/2014   Past Medical History:  Diagnosis Date   Actinic keratosis    Arthritis    Chronic systolic CHF (congestive heart failure) (HCC)    a. echo 6/16: EF 55-60%, Gr1DD, LA nl, PASP nl; b. echo 7/19: EF 40-45%, unable to exclude RWMA, Gr1DD, LA nl, RVSF nl, PASP nl   COPD (chronic obstructive pulmonary disease) (HCC)    Depression    Diverticulosis    Found during colonoscopy   Edema 07/21/2016   History of kidney stones    Hypertension    Left facial swelling 12/11/2021   Left lower quadrant abdominal pain 10/27/2021   Lightheadedness 04/21/2021   PAF (paroxysmal atrial fibrillation) (HCC)    a. short episodes previously noted on PPM interrogation; b. started on Xarelto 07/2017 given further episodes of Afib noted during interrogation; c. 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 Biotronik; Henrietta Hoover DR-T [id number]; placed 08/2012   Shortness of breath 04/21/2021   Sleep apnea    does not use CPAP   Symptomatic bradycardia    a. s/p Biotronik; Henrietta Hoover DR-T [id number]; placed 08/2012 at [facility]   Syncope 08/10/2017    Family History  Problem Relation Age of Onset   Arthritis Mother    Heart disease Father    Hyperlipidemia Father    Hypertension Father    Cancer Paternal Grandfather     Past Surgical History:  Procedure Laterality Date   APPENDECTOMY  age 43   CARDIAC CATHETERIZATION     7 years ago   COLONOSCOPY N/A 05/27/2014   Procedure: COLONOSCOPY;  Surgeon: Wallace Cullens, MD;  Location: [facility];  Service: Gastroenterology;  Laterality: N/A;  DRUG INDUCED ENDOSCOPY Bilateral 11/11/2021   Procedure: DRUG INDUCED ENDOSCOPY;  Surgeon: Christia Reading, MD;  Location: [facility];  Service: ENT;  Laterality: Bilateral;   FLOOR OF MOUTH BIOPSY N/A 01/05/2022   Procedure: BIOPSY OF PALATE;  Surgeon: Christia Reading, MD;  Location: [facility];  Service: ENT;  Laterality: N/A;   IMPLANTATION OF HYPOGLOSSAL NERVE STIMULATOR Left 01/05/2022   Procedure: IMPLANTATION OF HYPOGLOSSAL NERVE STIMULATOR;  Surgeon: Christia Reading, MD;  Location: [facility];  Service: ENT;  Laterality: Left;   INSERT / REPLACE / REMOVE PACEMAKER     3 years ago   Social History   Occupational History   Not on file  Tobacco Use   Smoking status: Every Day    Current packs/day: 0.00    Average packs/day: 2.0 packs/day for 59.0 years (118.0 ttl pk-yrs)    Types: Cigarettes    Start date: 1964    Last attempt to quit: 02/01/2021    Years since quitting: 1.8   Smokeless tobacco: Never   Tobacco comments:    Smoking 1 pack   Vaping Use   Vaping status: Never Used  Substance and Sexual Activity   Alcohol use: Yes    Alcohol/week: 8.0 - 14.0  standard drinks of alcohol    Types: 8 - 14 Cans of beer per week    Comment: daily beer   Drug use: Yes    Types: Marijuana    Comment: states smokes daily, last 11-09-21   Sexual activity: Yes

## 2022-12-07 ENCOUNTER — Ambulatory Visit: Payer: PPO | Admitting: Clinical

## 2022-12-07 ENCOUNTER — Ambulatory Visit (INDEPENDENT_AMBULATORY_CARE_PROVIDER_SITE_OTHER): Payer: PPO

## 2022-12-07 DIAGNOSIS — I495 Sick sinus syndrome: Secondary | ICD-10-CM | POA: Diagnosis not present

## 2022-12-08 LAB — CUP PACEART REMOTE DEVICE CHECK
Date Time Interrogation Session: 20241112084004
Implantable Lead Connection Status: 753985
Implantable Lead Connection Status: 753985
Implantable Lead Implant Date: 20140801
Implantable Lead Implant Date: 20140801
Implantable Lead Location: 753859
Implantable Lead Location: 753860
Implantable Lead Model: 362
Implantable Lead Serial Number: 29356730
Implantable Lead Serial Number: 29392661
Implantable Pulse Generator Implant Date: 20140801
Pulse Gen Serial Number: 68053398

## 2022-12-22 ENCOUNTER — Ambulatory Visit: Payer: PPO | Admitting: Clinical

## 2023-01-01 ENCOUNTER — Other Ambulatory Visit: Payer: Self-pay | Admitting: Primary Care

## 2023-01-01 DIAGNOSIS — E785 Hyperlipidemia, unspecified: Secondary | ICD-10-CM

## 2023-01-02 NOTE — Progress Notes (Unsigned)
HPI M former smoker followed for OSA, COPD (100 pkyrs, QS [id number]), Asbestos exposure, complicated by CHF/Pacemakert,, HTN, PAFib, COPD, HxTobacco Use, OSA, Diverticulosis, Osteoarthritis, ETOH, Anxiety/Depression, Hx Positive TB PPD,  -Ventolin hfa, Breztri,  NPSG [place] 09/10/15- AHI 44.3/ hr, desaturation to 83%, body weight 184 lbs, CPAP to 10 Inspire Hypoglossal Nerve Stimulator 01/05/22-Dr Genevie Cheshire Activation 02/09/22  =======================================================   11/23/22- 67 yoM Smoker(118 pk yrs) followed for OSA, COPD (100 pkyrs, QS [id number]), Asbestos exposure, complicated by CHF/Pacemaker, ASCVD, HTN, PAFib, CAD, HxTobacco Use, OSA, Diverticulosis, Osteoarthritis, ETOH, Anxiety/Depression, Hx Positive TB PPD,  He had installed and removed asbestos and he had worked in [employer], in addition to a long smoking history. -Ventolin hfa, Breztri, Temazepam, CT chest low dose screen/ Asbestos exposure-   last on 06/26/22 Inspire Hypoglossal Nerve Stimulator placed 01/05/22-Dr Genevie Cheshire Activation 02/09/22 Inspire Sleep  Study Titration-05/18/22- Body weight today-192 lbs -----Breathing has been ok  Smoking 1 ppd now. Had flu vax. He reports feeling most refreshed after sleeping at 1.2V, level 7.  He's going to try temazepam again as sleep aid. Unisom made him dizzy. Inspire tech recommends he try to advance 1 level/ week or 2 weeks, seeking to get him into the voltage range that gave best control on his sleep study, which was at 1.7-1.8 volts.  01/03/23- 67 yoM Smoker(118 pk yrs) followed for OSA, COPD (100 pkyrs, QS [id number]), Asbestos exposure, complicated by CHF/Pacemaker, ASCVD, HTN, PAFib, CAD, HxTobacco Use, OSA, Diverticulosis, Osteoarthritis, ETOH, Anxiety/Depression, Hx Positive TB PPD,  He had installed and removed asbestos and he had worked in [employer], in addition to a long smoking history. -Ventolin hfa, Breztri, Temazepam, CT chest low dose screen/  Asbestos exposure-   last on 06/26/22     Inspire Hypoglossal Nerve Stimulator placed 01/05/22-Dr Genevie Cheshire Activation 02/09/22 Inspire Sleep  Study Titration-05/18/22- Body weight today-191 lbs   BP 160/95 At last visit he was going to try building voltage tolerance toward 1.8V if possible, for better apnea control, work on smoking and ETOH, try temazepam for sleep (had been using lunesta 3), Discussed the use of AI scribe software for clinical note transcription with the patient, who gave verbal consent to proceed.  History of Present Illness   The patient, with a history of COPD and sleep apnea, has been using an Inspire  nerve stimulator and reports improved sleep over the past couple of weeks. However, the patient has been experiencing high blood pressure readings, reaching 160/90, and has been having nosebleeds in the morning for the past week. The patient has an upcoming appointment with his primary care doctor to address these issues. The patient also uses an albuterol inhaler and Breztri for COPD. He has  Lunesta for sleep, but instead has been taking Unisom. He complains still of frequent waking on some nights, and doesn't recall impression of Lunesta. The patient has been using a device from [facility] for sleep apnea, which seems to be improving his sleep quality. The patient is scheduled for a home sleep test in the future.  Note- after going through standard Inspire assessment with team here, and still sitting in exam chair, he experienced a few seconds of feeling strange. Pulse remained regular at about 70/min and he was oriented and responsive. He says he has had many episodes like this, going back before Inspire implantation.      ROS-see HPI   + = positive Constitutional:    weight loss, night sweats, fevers, chills, fatigue, lassitude. HEENT:  headaches, difficulty swallowing, tooth/dental problems, sore throat,       sneezing, itching, ear ache, nasal congestion, post nasal drip,  snoring CV:    chest pain, orthopnea, PND, swelling in lower extremities, anasarca,                                   dizziness, palpitations Resp:   +shortness of breath with exertion or at rest.                productive cough,   non-productive cough, coughing up of blood.              change in color of mucus.  wheezing.   Skin:    rash or lesions. GI:  +heartburn, indigestion, abdominal pain, nausea, vomiting, diarrhea,                 change in bowel habits, +loss of appetite GU: dysuria, change in color of urine, no urgency or frequency.   flank pain. MS:   joint pain, stiffness, decreased range of motion, back pain. Neuro-     nothing unusual Psych:  change in mood or affect.  depression or anxiety.   memory loss.  OBJ- Physical Exam   +overweight General- Alert, Oriented, Affect-appropriate, Distress- none Skin- rash-none, lesions- none, excoriation- none Lymphadenopathy- none Head- atraumatic            Eyes- Gross vision intact, PERRLA, conjunctivae and secretions clear            Ears- Hearing, canals-normal            Nose- Clear, no-Septal dev, mucus, polyps, erosion, perforation             Throat- Mallampati II , mucosa clear , drainage- none, tonsils- atrophic Neck- flexible , trachea midline, no stridor , thyroid nl, carotid no bruit Chest - symmetrical excursion , unlabored           Heart/CV- RRR , no murmur , no gallop  , no rub, nl s1 s2                           - JVD- none , edema- none, stasis changes- none, varices- none           Lung- +  no rhonchi, no distinct crackle,  wheeze-none, cough+light , dullness-none, rub- none           Chest wall- +pacemaker on right. + Inspire on left Abd-  Br/ Gen/ Rectal- Not done, not indicated Extrem- cyanosis- none, clubbing, none, atrophy- none, strength- nl Neuro- grossly intact to observation  Assessment and Plan    Hypertension Elevated blood pressure (160/90) noted during today's visit. The patient has an  appointment with primary care tomorrow for further evaluation and management. -Advise patient to discuss with primary care provider and possibly consult with cardiologist if no changes are made.  Sleep Apnea Patient reports improved sleep with the use of nerve stimulator. Noted some inconsistency in sleep duration, but overall trend towards improvement. -Continue current settings on nerve stimulator, or advance as tolerated. -Schedule home sleep study for January to assess control of apnea on current settings.  Epistaxis Patient reports morning nosebleeds for the past week. No other associated symptoms reported. -Advise use of over-the-counter nasal saline gel to maintain nasal moisture and potentially reduce nosebleeds. -Advise patient to discuss this with primary  care provider during tomorrow's visit.  COPD No recent flare-ups reported. Patient has received flu shot for the current year. -Continue current management with albuterol inhaler and Breztri.  Insomnia Patient reports using half a Unisom pill for sleep. Has prescription for Lunesta 3mg  but not consistently using. -Advise patient to try using Lunesta 3mg  as prescribed for sleep.  Follow-up Schedule follow-up visit for two weeks after home sleep study.

## 2023-01-03 ENCOUNTER — Ambulatory Visit: Payer: PPO | Admitting: Internal Medicine

## 2023-01-03 ENCOUNTER — Encounter: Payer: Self-pay | Admitting: Internal Medicine

## 2023-01-03 VITALS — BP 160/95 | HR 72 | Temp 97.7°F | Ht 65.0 in | Wt 191.8 lb

## 2023-01-03 DIAGNOSIS — G4733 Obstructive sleep apnea (adult) (pediatric): Secondary | ICD-10-CM | POA: Diagnosis not present

## 2023-01-03 NOTE — Progress Notes (Signed)
Remote pacemaker transmission.   

## 2023-01-03 NOTE — Patient Instructions (Signed)
See your primary care provider tomorrow about your BP  Try the nasal saline gel (otc) as needed to keep the inside of your nose a little less dry- it may cut down on nosebleeds.  We will schedule a home sleep test after Christmas.  Try using the eszopiclone BP med- see if you sleep better.  Keep on with your inhalers as instructed. Please let us know when you need refills.

## 2023-01-04 ENCOUNTER — Ambulatory Visit: Payer: PPO | Admitting: Family Medicine

## 2023-01-04 ENCOUNTER — Encounter: Payer: Self-pay | Admitting: Family Medicine

## 2023-01-04 VITALS — BP 120/80 | HR 78 | Temp 97.9°F | Ht 65.0 in | Wt 191.5 lb

## 2023-01-04 DIAGNOSIS — I10 Essential (primary) hypertension: Secondary | ICD-10-CM

## 2023-01-04 DIAGNOSIS — R040 Epistaxis: Secondary | ICD-10-CM | POA: Insufficient documentation

## 2023-01-04 HISTORY — DX: Epistaxis: R04.0

## 2023-01-04 LAB — CBC WITH DIFFERENTIAL/PLATELET
Basophils Absolute: 0.1 10*3/uL (ref 0.0–0.1)
Basophils Relative: 0.7 % (ref 0.0–3.0)
Eosinophils Absolute: 0.1 10*3/uL (ref 0.0–0.7)
Eosinophils Relative: 1.1 % (ref 0.0–5.0)
HCT: 50.2 % (ref 39.0–52.0)
Hemoglobin: 16.8 g/dL (ref 13.0–17.0)
Lymphocytes Relative: 34.7 % (ref 12.0–46.0)
Lymphs Abs: 3.1 10*3/uL (ref 0.7–4.0)
MCHC: 33.4 g/dL (ref 30.0–36.0)
MCV: 95.6 fL (ref 78.0–100.0)
Monocytes Absolute: 0.9 10*3/uL (ref 0.1–1.0)
Monocytes Relative: 9.9 % (ref 3.0–12.0)
Neutro Abs: 4.8 10*3/uL (ref 1.4–7.7)
Neutrophils Relative %: 53.6 % (ref 43.0–77.0)
Platelets: 280 10*3/uL (ref 150.0–400.0)
RBC: 5.26 Mil/uL (ref 4.22–5.81)
RDW: 13.7 % (ref 11.5–15.5)
WBC: 8.9 10*3/uL (ref 4.0–10.5)

## 2023-01-04 LAB — IBC PANEL
Iron: 69 ug/dL (ref 42–165)
Saturation Ratios: 18.8 % — ABNORMAL LOW (ref 20.0–50.0)
TIBC: 366.8 ug/dL (ref 250.0–450.0)
Transferrin: 262 mg/dL (ref 212.0–360.0)

## 2023-01-04 NOTE — Assessment & Plan Note (Signed)
Chronic, previously well-controlled on regimen of telmisartan 20 mg daily and Coreg 3.125 mg p.o. TWICE daily. A.m. blood pressure spikes and a.m. nosebleeds potentially secondary to the fact that he has stopped taking his evening dose of Coreg.  He is not sure why this happened except for maybe he stopped it accidentally when he stopped his  PM antidepressant.  I encouraged him to lower the telmisartan back to 20 mg daily and to make sure to take the Coreg twice daily as opposed to once daily.  Follow blood pressure at home and follow-up with PCP if not improving as expected. May need to additionally increase the telmisartan to 40 mg daily, but I am hesitant to do this as he has had hypertension in the past.

## 2023-01-04 NOTE — Progress Notes (Signed)
Patient ID: Brandon Lopez, male    DOB: 10-26-1955, 67 y.o.   MRN: [id number]  This visit was conducted in person.  BP 120/80 (BP Location: Left Arm, Patient Position: Sitting, Cuff Size: Large)   Pulse 78   Temp 97.9 F (36.6 C) (Temporal)   Ht 5\' 5"  (1.651 m)   Wt 191 lb 8 oz (86.9 kg)   SpO2 97%   BMI 31.87 kg/m    CC:  Chief Complaint  Patient presents with   Hypertension   Epistaxis    Subjective:   HPI: Brandon Lopez is a 66 y.o. male patient of Mayra Reel with history of alcohol abuse aortic atherosclerosis, COPD, hypertension, obstructive sleep apnea, paroxysmal atrial fibrillation, and tobacco abuse presenting on 01/04/2023 for Hypertension and Epistaxis  In last week he has been noting  AM nosebleeds... lasted few hours off and on. Has noted blood on pillow.  BP noted to running 150/90.  He has increased telmisartan to 40 mg on his own ... BP has since improved.  Low salt diet, no stress.  Since BP better, no further nosebleeds.  Now well-controlled in office today on telmisartan 40 mg p.o. daily, ( per pt not taking  carvedilol 3.25 mg  more than once daily , not sure when stopped taking the PM dose)  Per MAR pt supposed to be taking carvedilol 3.25 mg p.o. twice daily BP was noted to be high at pulmonary office visit yesterday. They recommended nasal saline gel over-the-counter for nosebleeds. BP Readings from Last 3 Encounters:  01/04/23 120/80  01/03/23 (!) 160/95  11/23/22 106/62         Relevant past medical, surgical, family and social history reviewed and updated as indicated. Interim medical history since our last visit reviewed. Allergies and medications reviewed and updated. Outpatient Medications Prior to Visit  Medication Sig Dispense Refill   albuterol (VENTOLIN HFA) 108 (90 Base) MCG/ACT inhaler INHALE 2 PUFFS BY MOUTH EVERY 6 HOURS AS NEEDED FOR WHEEZING AND FOR SHORTNESS OF BREATH 18 g 0   aspirin EC 81 MG tablet Take 81  mg by mouth daily. Swallow whole.     atorvastatin (LIPITOR) 20 MG tablet TAKE 1 TABLET BY MOUTH ONCE DAILY FOR CHOLESTEROL 90 tablet 0   Budeson-Glycopyrrol-Formoterol (BREZTRI AEROSPHERE) 160-9-4.8 MCG/ACT AERO Inhale 2 puffs into the lungs 2 (two) times daily. 10.7 g 11   buPROPion (WELLBUTRIN SR) 150 MG 12 hr tablet Take 150 mg by mouth 2 (two) times daily.     carvedilol (COREG) 3.125 MG tablet Take 1 tablet by mouth twice daily 180 tablet 2   Eszopiclone 3 MG TABS TAKE 1 TABLET BY MOUTH ONCE DAILY AT BEDTIME (TAKE  IMMEDIATELY  BEFORE  BEDTIME) 30 tablet 5   ezetimibe (ZETIA) 10 MG tablet Take 1 tablet (10 mg total) by mouth daily. 90 tablet 3   folic acid (FOLVITE) 1 MG tablet Take 1 tablet by mouth once daily 90 tablet 0   tadalafil (CIALIS) 5 MG tablet TAKE 1 TABLET BY MOUTH ONCE DAILY FOR  FREQUENT  URINATION 90 tablet 3   telmisartan (MICARDIS) 20 MG tablet Take 1 tablet by mouth once daily for blood pressure 90 tablet 3   triamcinolone cream (KENALOG) 0.1 % Apply 1 Application topically 2 (two) times daily as needed. For rash. 30 g 0   No facility-administered medications prior to visit.     Per HPI unless specifically indicated in ROS section below Review of Systems  Constitutional:  Negative for fatigue and fever.  HENT:  Negative for ear pain.   Eyes:  Negative for pain.  Respiratory:  Negative for cough and shortness of breath.   Cardiovascular:  Negative for chest pain, palpitations and leg swelling.  Gastrointestinal:  Negative for abdominal pain.  Genitourinary:  Negative for dysuria.  Musculoskeletal:  Negative for arthralgias.  Neurological:  Negative for syncope, light-headedness and headaches.  Psychiatric/Behavioral:  Negative for dysphoric mood.    Objective:  BP 120/80 (BP Location: Left Arm, Patient Position: Sitting, Cuff Size: Large)   Pulse 78   Temp 97.9 F (36.6 C) (Temporal)   Ht 5\' 5"  (1.651 m)   Wt 191 lb 8 oz (86.9 kg)   SpO2 97%   BMI 31.87  kg/m   Wt Readings from Last 3 Encounters:  01/04/23 191 lb 8 oz (86.9 kg)  01/03/23 191 lb 12.8 oz (87 kg)  11/23/22 192 lb 12.8 oz (87.5 kg)      Physical Exam Constitutional:      Appearance: He is well-developed.  HENT:     Head: Normocephalic.     Right Ear: Hearing normal.     Left Ear: Hearing normal.     Nose: Nose normal.     Comments: Left nare with posterior clot, no active bleeding Neck:     Thyroid: No thyroid mass or thyromegaly.     Vascular: No carotid bruit.     Trachea: Trachea normal.  Cardiovascular:     Rate and Rhythm: Normal rate and regular rhythm.     Pulses: Normal pulses.     Heart sounds: Heart sounds not distant. No murmur heard.    No friction rub. No gallop.     Comments: No peripheral edema Pulmonary:     Effort: Pulmonary effort is normal. No respiratory distress.     Breath sounds: Normal breath sounds.  Skin:    General: Skin is warm and dry.     Findings: No rash.  Psychiatric:        Speech: Speech normal.        Behavior: Behavior normal.        Thought Content: Thought content normal.       Results for orders placed or performed in visit on 12/07/22  CUP PACEART REMOTE DEVICE CHECK  Result Value Ref Range   Pulse Generator Manufacturer BITR    Date Time Interrogation Session [phone]    Pulse Gen Model Evia DR-T    Pulse Gen Serial Number [id number]    Clinic Name [facility]    Implantable Pulse Generator Type Implantable Pulse Generator    Implantable Pulse Generator Implant Date [id number]    Implantable Lead Manufacturer BITR    Implantable Lead Model 362 700 Siello S 45    Implantable Lead Serial Number [id number]    Implantable Lead Implant Date [id number]    Implantable Lead Location Detail 1 APPENDAGE    Implantable Lead Special Function Dr. Woodfin Ganja    Implantable Lead Location [id number]    Implantable Lead Connection Status [id number]    Implantable Lead Manufacturer BITR    Implantable Lead Model PS53-BP     Implantable Lead Serial Number [id number]    Implantable Lead Implant Date [id number]    Implantable Lead Location Detail 1 APEX    Implantable Lead Special Function Dr. Nicolette Bang    Implantable Lead Location [phone]    Implantable Lead Connection Status [phone]     Assessment and Plan  There are no diagnoses linked to this encounter.  No follow-ups on file.   Kerby Nora, MD

## 2023-01-04 NOTE — Assessment & Plan Note (Signed)
Acute, now resolved with improvement in blood pressure.  Scab in place.  Can use nasal saline hydration.  No indication for referral to ENT.  Will evaluate hemoglobin and iron level given 1 week of significant nosebleeds. Return and ER precautions provided.

## 2023-01-04 NOTE — Patient Instructions (Addendum)
Return to telmisartan 20 mg daily but add back the second dose of the coreg later in the day for twice daily dosing.  Follow  blood pressure daily.Marland Kitchen goal < 140/90.  Please stop at the lab to have labs drawn.

## 2023-01-05 ENCOUNTER — Encounter: Payer: Self-pay | Admitting: Internal Medicine

## 2023-01-08 ENCOUNTER — Emergency Department: Payer: PPO

## 2023-01-08 ENCOUNTER — Other Ambulatory Visit: Payer: Self-pay

## 2023-01-08 ENCOUNTER — Emergency Department
Admission: EM | Admit: 2023-01-08 | Discharge: 2023-01-08 | Disposition: A | Discharge status: Home / Self Care | Payer: PPO | Attending: Emergency Medicine | Admitting: Emergency Medicine

## 2023-01-08 DIAGNOSIS — R079 Chest pain, unspecified: Secondary | ICD-10-CM

## 2023-01-08 DIAGNOSIS — I6523 Occlusion and stenosis of bilateral carotid arteries: Secondary | ICD-10-CM | POA: Diagnosis not present

## 2023-01-08 DIAGNOSIS — I10 Essential (primary) hypertension: Secondary | ICD-10-CM | POA: Insufficient documentation

## 2023-01-08 DIAGNOSIS — I509 Heart failure, unspecified: Secondary | ICD-10-CM | POA: Diagnosis not present

## 2023-01-08 DIAGNOSIS — R0789 Other chest pain: Secondary | ICD-10-CM | POA: Insufficient documentation

## 2023-01-08 DIAGNOSIS — J449 Chronic obstructive pulmonary disease, unspecified: Secondary | ICD-10-CM | POA: Diagnosis not present

## 2023-01-08 DIAGNOSIS — R42 Dizziness and giddiness: Secondary | ICD-10-CM | POA: Insufficient documentation

## 2023-01-08 DIAGNOSIS — R918 Other nonspecific abnormal finding of lung field: Secondary | ICD-10-CM | POA: Diagnosis not present

## 2023-01-08 DIAGNOSIS — I110 Hypertensive heart disease with heart failure: Secondary | ICD-10-CM | POA: Diagnosis not present

## 2023-01-08 DIAGNOSIS — I672 Cerebral atherosclerosis: Secondary | ICD-10-CM | POA: Diagnosis not present

## 2023-01-08 LAB — BASIC METABOLIC PANEL
Anion gap: 9 (ref 5–15)
BUN: 10 mg/dL (ref 8–23)
CO2: 27 mmol/L (ref 22–32)
Calcium: 8.9 mg/dL (ref 8.9–10.3)
Chloride: 98 mmol/L (ref 98–111)
Creatinine, Ser: 0.83 mg/dL (ref 0.61–1.24)
GFR, Estimated: >60 mL/min (ref >60)
Glucose, Bld: 94 mg/dL (ref 70–99)
Potassium: 4 mmol/L (ref 3.5–5.1)
Sodium: 134 mmol/L — ABNORMAL LOW (ref 135–145)

## 2023-01-08 LAB — CBC
HCT: 49.8 % (ref 39.0–52.0)
Hemoglobin: 16.9 g/dL (ref 13.0–17.0)
MCH: 32.1 pg (ref 26.0–34.0)
MCHC: 33.9 g/dL (ref 30.0–36.0)
MCV: 94.5 fL (ref 80.0–100.0)
Platelets: 258 10*3/uL (ref 150–400)
RBC: 5.27 MIL/uL (ref 4.22–5.81)
RDW: 12.9 % (ref 11.5–15.5)
WBC: 9.6 10*3/uL (ref 4.0–10.5)
nRBC: 0 % (ref 0.0–0.2)

## 2023-01-08 LAB — TROPONIN I (HIGH SENSITIVITY)
Troponin I (High Sensitivity): 3 ng/L (ref <18)
Troponin I (High Sensitivity): 3 ng/L (ref <18)

## 2023-01-08 MED ORDER — PANTOPRAZOLE SODIUM 40 MG IV SOLR
40.0000 mg | Freq: Once | INTRAVENOUS | Status: AC
  Administered 2023-01-08: 40 mg via INTRAVENOUS
  Filled 2023-01-08: qty 10

## 2023-01-08 MED ORDER — IOHEXOL 350 MG/ML SOLN
75.0000 mL | Freq: Once | INTRAVENOUS | Status: AC | PRN
  Administered 2023-01-08: 75 mL via INTRAVENOUS

## 2023-01-08 NOTE — ED Provider Notes (Signed)
[facility] Provider Note    Event Date/Time   First MD Initiated Contact with Patient 01/08/23 1723     (approximate)   History   Chest Pain   HPI  Brandon Lopez is a 67 y.o. male with history of hypertension who comes in with concerns for high blood pressure.  Patient reports having high blood pressure this morning with some chest pressure that is been intermittent, feels like a burning sensation as well as some dizziness.  He reports that the chest pain and dizziness kind of come and go.  Lasts for a few seconds.  He denies any exertional chest pain.  States chest pain feels a pressure/burning sensation.  Does report history of acid reflux which she takes Protonix intermittently for.  Patient was seen by his primary care doctor 4 days ago.  I reviewed this note where he had normal blood pressure.  He reports his dizziness is also intermittent in nature.  He denies any dizziness at this time.  Last for a few seconds and goes away.   Physical Exam   Triage Vital Signs: ED Triage Vitals  Encounter Vitals Group     BP 01/08/23 1500 (!) 173/110     Systolic BP Percentile --      Diastolic BP Percentile --      Pulse Rate 01/08/23 1500 77     Resp 01/08/23 1500 17     Temp 01/08/23 1500 98.2 F (36.8 C)     Temp Source 01/08/23 1500 Oral     SpO2 01/08/23 1500 98 %     Weight --      Height --      Head Circumference --      Peak Flow --      Pain Score 01/08/23 1506 7     Pain Loc --      Pain Education --      Exclude from Growth Chart --     Most recent vital signs: Vitals:   01/08/23 1500 01/08/23 1730  BP: (!) 173/110 (!) 129/99  Pulse: 77 99  Resp: 17 19  Temp: 98.2 F (36.8 C)   SpO2: 98% 95%     General: Awake, no distress.  CV:  Good peripheral perfusion.  Resp:  Normal effort.  Abd:  No distention.  Soft and nontender Other:  Equal strength in arms and legs.  Sensation intact.  No  cranial nerve deficits.  Finger-nose  intact bilaterally   ED Results / Procedures / Treatments   Labs (all labs ordered are listed, but only abnormal results are displayed) Labs Reviewed  BASIC METABOLIC PANEL - Abnormal; Notable for the following components:      Result Value   Sodium 134 (*)    All other components within normal limits  CBC  TROPONIN I (HIGH SENSITIVITY)  TROPONIN I (HIGH SENSITIVITY)     EKG  My interpretation of EKG:  Atrially paced rhythm with a rate of 86 without any ST elevation or T wave inversions, normal intervals  RADIOLOGY I have reviewed the CT head personally interpreted no evidence of intracranial hemorrhage PROCEDURES:  Critical Care performed: No  .1-3 Lead EKG Interpretation  Performed by: Concha Se, MD Authorized by: Concha Se, MD     Interpretation: normal     ECG rate assessment: normal     Rhythm: paced     Ectopy: none     Conduction: normal  MEDICATIONS ORDERED IN ED: Medications - No data to display   IMPRESSION / MDM / ASSESSMENT AND PLAN / ED COURSE  I reviewed the triage vital signs and the nursing notes.   Patient's presentation is most consistent with acute presentation with potential threat to life or bodily function.   Patient comes in with concerns for dizziness, elevated blood pressure.  Patient's blood pressures come down to normal without any interventions here.  We discussed that sometimes the blood pressure can be elevated if you are not feeling well or you are anxious.  Cardiac markers will be ordered evaluate for ACS.  At this time he is asymptomatic but given the intermittent nature will get CT angio to look for anything that would put him at high risk for TIAs.  Patient unable to get MRI here but given he is asymptomatic at this time seems less likely a stroke.  Will try to get his pacemaker interrogated but at this time it looks like it is functioning correctly.  Tropes are negative x 2, BMP reassuring.  CBC  reassuring  IMPRESSION: 1. No large vessel occlusion, hemodynamically significant stenosis, or aneurysm in the head or neck. 2. Mild atherosclerosis in the neck without hemodynamically significant stenosis.  Discussed with patient reassuring results.  The nurse was able to talk with the device rep for his device interrogation and he has a home unit that it has not detected any abnormalities.  He denies any symptoms at this time and his CTA imaging is reassuring  Patient was provided a copy of the report so he can see them.  Discussed with patient he denies any dizziness at this time we discussed transfer for MRI but have opted to decline given he is back to baseline self with reassuring CT imaging and blood pressure has normalized.  Patient is amatory around the room without any symptoms and he feels comfortable with going home.  We discussed that we cannot predict future heart problems so if he develops a change in symptoms and he needs to return for repeat evaluation and he should call his cardiologist to be evaluated for things that can put him at higher risk for heart attack.  However at this time he feels comfortable with discharge home     The patient is on the cardiac monitor to evaluate for evidence of arrhythmia and/or significant heart rate changes.      FINAL CLINICAL IMPRESSION(S) / ED DIAGNOSES   Final diagnoses:  Nonspecific chest pain  Dizziness     Rx / DC Orders   ED Discharge Orders     None        Note:  This document was prepared using Dragon voice recognition software and may include unintentional dictation errors.   Concha Se, MD 01/08/23 [phone]

## 2023-01-08 NOTE — ED Notes (Signed)
April [employer] [phone] for further help with pt pacemaker.

## 2023-01-08 NOTE — Discharge Instructions (Addendum)
IMPRESSION: 1. No acute intracranial abnormality is seen. 2. At least moderate cortical atrophy, greatest within the bilateral frontal lobes and appearing similar on the provided remote 2013 CT axial images. This appears mildly advanced for patient age. No signs of a heart attack today but you should call cardiology to make a follow-up appointment and return to the ER if you develop return of symptoms or worsening symptoms or any other concerns.  Start taking your acid reducer daily in case this could be related.     IMPRESSION: 1. No large vessel occlusion, hemodynamically significant stenosis, or aneurysm in the head or neck. 2. Mild atherosclerosis in the neck without hemodynamically significant stenosis.

## 2023-01-08 NOTE — ED Notes (Signed)
Brandon Lopez is faxing over daily reports of pacemaker.

## 2023-01-08 NOTE — ED Triage Notes (Signed)
Pt to ed from home via POV for high BP, CP and dizziness that started early this morning. Pt is caox4, in no acute distress. Saw PCP earlier this week and they adjusted his BP meds and was advised if it didn't come down in a few days to come to the ER.

## 2023-01-08 NOTE — ED Notes (Signed)
[employer] [phone] called for pacemaker interrogation. Waiting on call back.

## 2023-01-08 NOTE — ED Provider Triage Note (Signed)
Emergency Medicine Provider Triage Evaluation Note  Brandon Lopez , a 67 y.o. male  was evaluated in triage.  Pt complains of chest pain.  Review of Systems  Positive:  Negative:   Physical Exam  There were no vitals taken for this visit. Gen:   Awake, no distress   Resp:  Normal effort  MSK:   Moves extremities without difficulty  Other:    Medical Decision Making  Medically screening exam initiated at 3:00 PM.  Appropriate orders placed.  Brandon Lopez was informed that the remainder of the evaluation will be completed by another provider, this initial triage assessment does not replace that evaluation, and the importance of remaining in the ED until their evaluation is complete.  Patient is followed by Dr. Graciela Husbands for his pacemaker, cardiologist is with [facility]   Faythe Ghee, PA-C 01/08/23 1500

## 2023-01-12 NOTE — Progress Notes (Signed)
Cardiology Clinic Note   Date: 01/14/2023 ID: Brandon Lopez, DOB Jun 22, 1955, MRN [id number]  Primary Cardiologist:  Julien Nordmann, MD  Patient Profile    Brandon Lopez is a 67 y.o. male who presents to the clinic today for follow up after ED visit.     Past medical history significant for: Nonobstructive CAD. Nuclear stress test 08/26/2017: Low risk scan without ischemia. Chronic diastolic heart failure/nonischemic cardiomyopathy. Echo 04/22/2021: EF 60 to 65%.  No RWMA.  Grade I DD.  Normal RV size/function.  Normal PA pressure.  RVSP 21.6 mmHg.  Mild MR. PAF. Symptomatic bradycardia. S/p PPM implantation 2014 at [facility]. Remote device check 12/07/2022: Normal device function.  Battery status good.  Lead measurements unchanged.  Histograms appropriate. Hypertension. Hyperlipidemia. Lipid panel 01/12/2022: Direct LDL 84, HDL 41, TG 259, total 153. OSA. S/p inspire insertion December 2023. COPD. GERD. Tobacco abuse. Alcohol use. Carotid artery disease. Carotid duplex 08/31/2017: Bilateral ICA 1-39%.  In summary, patient was previously followed at [facility].  He underwent PPM implantation in 2014 for symptomatic bradycardia with heart rate in the 40s.  He established care with Dr. Graciela Husbands in May 2016.  At that time he complained of DOE.  Myoview showed no ischemia with EF 50%.  In December 2017 he was noted to have A-fib on routine follow-up.  Given CHA2DS2-VASc of 1 he was not placed on anticoagulation.  In July 2019 he underwent hospital admission for syncopal episode that was preceded by chest pressure.  Device interrogation showed 18 episodes of A-fib with RVR for total of 3 hours.  His initial workup was essentially unremarkable other than hypokalemia.  Echo showed EF 40 to 45%, unable to exclude RWMA, Grade I DD, RVSF normal.  He was started on Xarelto and aspirin was discontinued.  He was also started on Toprol.  Outpatient nuclear stress  test was a low risk scan without ischemia.  Upon follow-up with Dr. Graciela Husbands in September 2019 anticoagulation was discontinued secondary to diagnosis of subclinical A-fib.     History of Present Illness    Brandon Lopez is followed by Dr. Mariah Milling and Dr. Graciela Husbands for the above outlined history.   Patient was last seen in the office by Sherie Don, NP on 05/13/2022.  He was doing well at that time from a cardiac standpoint.  He complained of some depression which he planned to speak with PCP about.  Last seen by Dr. Mariah Milling on 09/07/2022 for routine follow-up.  He was doing well at that time and no medication changes were made.  Patient presented to the ED on 01/08/2023 with complaints of elevated BP and chest pain.  He had been seen by PCP earlier in the week and BP meds were adjusted.  Workup was unremarkable with normal kidney function, electrolytes, blood counts.  Troponin negative x 2.  Chest x-ray without acute cardiopulmonary process.  Today, patient reports BP has been elevated at home a couple of times this week. BP is normally ~125/70. He has been getting readings "in the triple digits top and bottom numbers." He does not have a log of these readings. He reports chest tightness only occurs when BP is elevated. 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 Patient denies shortness of breath, dyspnea on exertion, lower extremity edema, orthopnea or PND. No palpitations. He continues to smoke 1+ pack per day. He is also an every day alcohol drinker drinking 3-8 beers nightly.  ROS: All other systems reviewed and are otherwise negative except as noted in History of Present Illness.  Studies Reviewed    EKG is not ordered today.   Physical Exam    VS:  BP 130/88 (BP Location: Left Arm, Patient Position: Sitting, Cuff Size: Normal)   Pulse 74   Ht 5\' 5"  (1.651 m)   Wt 193 lb (87.5 kg)   SpO2 98%   BMI 32.12 kg/m  , BMI Body mass  index is 32.12 kg/m.  GEN: Well nourished, well developed, in no acute distress. Neck: No JVD or carotid bruits. Cardiac:  RRR. No murmurs. No rubs or gallops.   Respiratory:  Respirations regular and unlabored. Clear to auscultation without rales, wheezing or rhonchi. Clubbing bilateral fingernails.  GI: Soft, nontender, nondistended.  Extremities: Radials/DP/PT 2+ and equal bilaterally. No clubbing or cyanosis. No edema.  Skin: Warm and dry, no rash. Neuro: Strength intact.  Assessment & Plan   Nonobstructive CAD Nuclear stress test August 2019 was a low risk study without ischemia.  Patient reports chest tightness only occurs with elevated BP. 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  -Continue aspirin, atorvastatin, Zetia.  Chronic diastolic heart failure Echo March 2023 showed normal LV/RV function, Grade I DD, Grade I DD, normal PA pressure.  Patient denies shortness of breath, DOE, orthopnea or PND.  Euvolemic and well compensated on exam. -Continue telmisartan, carvedilol.  PAF No PAF detected on last remote device check.  Patient denies palpitations. -Continue aspirin, carvedilol.  Hypertension BP today 128/90 on intake and 130/88 on my recheck. He reports elevated BP a couple of time since ED visit on 12/14. He reports associated chest tightness when BP is elevated. He reports PCP increased telmisartan but BP got too low so he went back to the lower dose. He would prefer not to make any medication changes today. He is instructed if BP gets as high as it did when he went to ED he can take an extra dose of telmisartan. If BP continues to be persistently elevated can consider addition of amlodipine 2.5 mg to start.   -Continue carvedilol, telmisartan.  Hyperlipidemia LDL December 2019 84. -Continue atorvastatin and Zetia.  Tobacco abuse Patient continues to smoke 1+ pack per day. He is interested in quitting.  -Work on systematic reduction of  cigarettes until complete cessation.   Alcohol use Patient drinks 3-8 beers per night. He is not interested in changing this.  -Encouraged decreasing alcohol intake.   Disposition: Return in 6 months or sooner as needed.          Signed, Etta Grandchild. Capone Schwinn, DNP, NP-C

## 2023-01-14 ENCOUNTER — Ambulatory Visit: Payer: PPO | Attending: Student | Admitting: Student

## 2023-01-14 ENCOUNTER — Encounter: Payer: Self-pay | Admitting: Student

## 2023-01-14 VITALS — BP 130/88 | HR 74 | Ht 65.0 in | Wt 193.0 lb

## 2023-01-14 DIAGNOSIS — I10 Essential (primary) hypertension: Secondary | ICD-10-CM

## 2023-01-14 DIAGNOSIS — I2510 Atherosclerotic heart disease of native coronary artery without angina pectoris: Secondary | ICD-10-CM | POA: Diagnosis not present

## 2023-01-14 DIAGNOSIS — I5032 Chronic diastolic (congestive) heart failure: Secondary | ICD-10-CM | POA: Diagnosis not present

## 2023-01-14 DIAGNOSIS — I480 Paroxysmal atrial fibrillation: Secondary | ICD-10-CM | POA: Diagnosis not present

## 2023-01-14 DIAGNOSIS — Z789 Other specified health status: Secondary | ICD-10-CM

## 2023-01-14 DIAGNOSIS — Z720 Tobacco use: Secondary | ICD-10-CM

## 2023-01-14 DIAGNOSIS — E785 Hyperlipidemia, unspecified: Secondary | ICD-10-CM | POA: Diagnosis not present

## 2023-01-14 NOTE — Patient Instructions (Signed)
Medication Instructions:  Your Physician recommend you continue on your current medication as directed.    *If you need a refill on your cardiac medications before your next appointment, please call your pharmacy*   Lab Work: None ordered at this time    Follow-Up: At [facility], you and your health needs are our priority.  As part of our continuing mission to provide you with exceptional heart care, we have created designated Provider Care Teams.  These Care Teams include your primary Cardiologist (physician) and Advanced Practice Providers (APPs -  Physician Assistants and Nurse Practitioners) who all work together to provide you with the care you need, when you need it.  We recommend signing up for the patient portal called "MyChart".  Sign up information is provided on this After Visit Summary.  MyChart is used to connect with patients for Virtual Visits (Telemedicine).  Patients are able to view lab/test results, encounter notes, upcoming appointments, etc.  Non-urgent messages can be sent to your provider as well.   To learn more about what you can do with MyChart, go to [email or web address].    Your next appointment:   6 month(s)  Provider:   You may see Julien Nordmann, MD or one of the following Advanced Practice Providers on your designated Care Team:   Nicolasa Ducking, NP Eula Listen, PA-C Cadence Fransico Michael, PA-C Charlsie Quest, NP Carlos Levering, NP

## 2023-01-18 ENCOUNTER — Encounter: Payer: Self-pay | Admitting: Primary Care

## 2023-01-18 ENCOUNTER — Ambulatory Visit: Payer: PPO | Admitting: Primary Care

## 2023-01-18 VITALS — BP 136/86 | HR 68 | Temp 98.0°F | Ht 65.0 in | Wt 192.6 lb

## 2023-01-18 DIAGNOSIS — F32A Depression, unspecified: Secondary | ICD-10-CM

## 2023-01-18 DIAGNOSIS — Z0000 Encounter for general adult medical examination without abnormal findings: Secondary | ICD-10-CM | POA: Diagnosis not present

## 2023-01-18 DIAGNOSIS — R3912 Poor urinary stream: Secondary | ICD-10-CM

## 2023-01-18 DIAGNOSIS — I480 Paroxysmal atrial fibrillation: Secondary | ICD-10-CM | POA: Diagnosis not present

## 2023-01-18 DIAGNOSIS — R351 Nocturia: Secondary | ICD-10-CM | POA: Diagnosis not present

## 2023-01-18 DIAGNOSIS — K219 Gastro-esophageal reflux disease without esophagitis: Secondary | ICD-10-CM

## 2023-01-18 DIAGNOSIS — I10 Essential (primary) hypertension: Secondary | ICD-10-CM

## 2023-01-18 DIAGNOSIS — E782 Mixed hyperlipidemia: Secondary | ICD-10-CM

## 2023-01-18 DIAGNOSIS — M19012 Primary osteoarthritis, left shoulder: Secondary | ICD-10-CM

## 2023-01-18 DIAGNOSIS — Z125 Encounter for screening for malignant neoplasm of prostate: Secondary | ICD-10-CM

## 2023-01-18 DIAGNOSIS — G4733 Obstructive sleep apnea (adult) (pediatric): Secondary | ICD-10-CM

## 2023-01-18 DIAGNOSIS — J449 Chronic obstructive pulmonary disease, unspecified: Secondary | ICD-10-CM

## 2023-01-18 DIAGNOSIS — F419 Anxiety disorder, unspecified: Secondary | ICD-10-CM | POA: Diagnosis not present

## 2023-01-18 DIAGNOSIS — F5101 Primary insomnia: Secondary | ICD-10-CM

## 2023-01-18 LAB — LIPID PANEL
Cholesterol: 143 mg/dL (ref 0–200)
HDL: 44.2 mg/dL (ref >39.00)
LDL Cholesterol: 74 mg/dL (ref 0–99)
NonHDL: 98.44
Total CHOL/HDL Ratio: 3
Triglycerides: 124 mg/dL (ref 0.0–149.0)
VLDL: 24.8 mg/dL (ref 0.0–40.0)

## 2023-01-18 LAB — HEMOGLOBIN A1C: Hgb A1c MFr Bld: 5.7 % (ref 4.6–6.5)

## 2023-01-18 LAB — PSA, MEDICARE: PSA: 0.29 ng/mL (ref 0.10–4.00)

## 2023-01-18 MED ORDER — PANTOPRAZOLE SODIUM 20 MG PO TBEC
20.0000 mg | DELAYED_RELEASE_TABLET | Freq: Every day | ORAL | 0 refills | Status: DC
Start: 2023-01-18 — End: 2023-04-18

## 2023-01-18 NOTE — Assessment & Plan Note (Signed)
Improving.  Inspire in place. Continue Lunesta 3 mg, discussed to use sparingly.   Following with pulmonology

## 2023-01-18 NOTE — Assessment & Plan Note (Signed)
Following with orthopedics.  Reviewed CT shoulder from October 2024.

## 2023-01-18 NOTE — Assessment & Plan Note (Signed)
Improved, but continues with active symptoms despite H2 blocker use.  Trial of pantoprazole 20 mg daily sent to pharmacy.  He will update.

## 2023-01-18 NOTE — Progress Notes (Signed)
Subjective:    Patient ID: Brandon Lopez, male    DOB: May 13, 1955, 67 y.o.   MRN: [id number]  HPI  Brandon Lopez is a very pleasant 67 y.o. male who presents today for complete physical and follow up of chronic conditions.  Immunizations: -Tetanus: Completed in 2023 -Influenza: Completed this season  -Shingles: Completed first Shigrix dose  -Pneumonia: Completed Prevnar 20 in 2022 and Pneumovax 23 in 2016  Diet: Fair diet.  Exercise: No regular exercise.  Eye exam: Completes annually  Dental exam: Completed >1 year ago   Colonoscopy: Completed in 2016, due 2026 Lung Cancer Screening: Completed in June 2024  PSA: Due  BP Readings from Last 3 Encounters:  01/18/23 136/86  01/14/23 130/88  01/08/23 (!) 146/97       Review of Systems  Constitutional:  Negative for unexpected weight change.  HENT:  Negative for rhinorrhea.   Respiratory:  Negative for cough and shortness of breath.   Cardiovascular:  Negative for chest pain.  Gastrointestinal:  Negative for constipation and diarrhea.  Genitourinary:  Negative for difficulty urinating.  Musculoskeletal:  Positive for arthralgias.  Skin:  Negative for rash.  Allergic/Immunologic: Negative for environmental allergies.  Neurological:  Negative for dizziness and headaches.  Psychiatric/Behavioral:  Positive for sleep disturbance. The patient is not nervous/anxious.          Past Medical History:  Diagnosis Date   Actinic keratosis    Arthritis    Chronic systolic CHF (congestive heart failure) (HCC)    a. echo 6/16: EF 55-60%, Gr1DD, LA nl, PASP nl; b. 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 short episodes previously noted on PPM interrogation; b. started on Xarelto 07/2017 given further episodes of Afib noted during interrogation; c. 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 Biotronik; Henrietta Hoover DR-T [id number]; placed 08/2012   Rectal bleeding 09/19/2018   Shortness of breath 04/21/2021   Skin lesions 07/03/2020   Sleep apnea    does not use CPAP   Symptomatic bradycardia    a. s/p Biotronik; Henrietta Hoover DR-T [id number]; placed 08/2012 at [facility]   Syncope 08/10/2017    Social History   Socioeconomic History   Marital status: Single    Spouse name: Not on file   Number of children: Not on file   Years of education: Not on file   Highest education level: GED or equivalent  Occupational History   Not on file  Tobacco Use   Smoking status: Every Day    Current packs/day: 0.00    Average packs/day: 2.0 packs/day for 59.0 years (118.0 ttl pk-yrs)    Types: Cigarettes    Start date: 1964    Last attempt to quit: 02/01/2021  Years since quitting: 1.9   Smokeless tobacco: Never   Tobacco comments:    Smoking 1 pack   Vaping Use   Vaping status: Never Used  Substance and Sexual Activity   Alcohol use: Yes    Alcohol/week: 8.0 - 14.0 standard drinks of alcohol    Types: 8 - 14 Cans of beer per week    Comment: daily beer   Drug use: Yes    Types: Marijuana    Comment: states smokes daily, last 11-09-21   Sexual activity: Yes  Other Topics Concern   Not on file  Social History Narrative   Not on file   Social Drivers of Health   Financial Resource Strain: Medium Risk (01/16/2023)   Overall  Financial Resource Strain (CARDIA)    Difficulty of Paying Living Expenses: Somewhat hard  Food Insecurity: Food Insecurity Present (01/16/2023)   Hunger Vital Sign    Worried About Running Out of Food in the Last Year: Sometimes true    Ran Out of Food in the Last Year: Never true  Transportation Needs: No Transportation Needs (01/16/2023)   PRAPARE - Administrator, Civil Service (Medical): No    Lack of Transportation (Non-Medical): No  Physical Activity: Inactive (01/16/2023)   Exercise Vital Sign    Days of Exercise per Week: 0 days    Minutes of Exercise per Session: 20 min  Stress: No Stress Concern Present (01/16/2023)   [employer] of Occupational Health - Occupational Stress Questionnaire    Feeling of Stress : Not at all  Recent Concern: Stress - Stress Concern Present (11/13/2022)   [employer] of Occupational Health - Occupational Stress Questionnaire    Feeling of Stress : To some extent  Social Connections: Socially Isolated (01/16/2023)   Social Connection and Isolation Panel [NHANES]    Frequency of Communication with Friends and Family: More than three times a week    Frequency of Social Gatherings with Friends and Family: Patient declined    Attends Religious Services: Never    Database administrator or Organizations: No    Attends Banker Meetings: Never    Marital Status: Divorced  Catering manager Violence: Not At Risk (07/28/2022)   Humiliation, Afraid, Rape, and Kick questionnaire    Fear of Current or Ex-Partner: No    Emotionally Abused: No    Physically Abused: No    Sexually Abused: No    Past Surgical History:  Procedure Laterality Date   APPENDECTOMY  age 71   CARDIAC CATHETERIZATION     7 years ago   COLONOSCOPY N/A 05/27/2014   Procedure: COLONOSCOPY;  Surgeon: Wallace Cullens, MD;  Location: [facility] ENDOSCOPY;  Service: Gastroenterology;  Laterality: N/A;   DRUG INDUCED ENDOSCOPY Bilateral 11/11/2021   Procedure: DRUG  INDUCED ENDOSCOPY;  Surgeon: Christia Reading, MD;  Location: [facility];  Service: ENT;  Laterality: Bilateral;   FLOOR OF MOUTH BIOPSY N/A 01/05/2022   Procedure: BIOPSY OF PALATE;  Surgeon: Christia Reading, MD;  Location: [facility];  Service: ENT;  Laterality: N/A;   IMPLANTATION OF HYPOGLOSSAL NERVE STIMULATOR Left 01/05/2022   Procedure: IMPLANTATION OF HYPOGLOSSAL NERVE STIMULATOR;  Surgeon: Christia Reading, MD;  Location:  SURGERY CENTER;  Service: ENT;  Laterality: Left;   INSERT / REPLACE / REMOVE PACEMAKER     3 years ago    Family History  Problem Relation Age of Onset   Arthritis Mother    Heart disease Father  Hyperlipidemia Father    Hypertension Father    Cancer Paternal Grandfather    Hyperlipidemia Brother     Allergies  Allergen Reactions   Simvastatin Diarrhea   Lisinopril Cough    Current Outpatient Medications on File Prior to Visit  Medication Sig Dispense Refill   albuterol (VENTOLIN HFA) 108 (90 Base) MCG/ACT inhaler INHALE 2 PUFFS BY MOUTH EVERY 6 HOURS AS NEEDED FOR WHEEZING AND FOR SHORTNESS OF BREATH 18 g 0   aspirin EC 81 MG tablet Take 81 mg by mouth daily. Swallow whole.     atorvastatin (LIPITOR) 20 MG tablet TAKE 1 TABLET BY MOUTH ONCE DAILY FOR CHOLESTEROL 90 tablet 0   Budeson-Glycopyrrol-Formoterol (BREZTRI AEROSPHERE) 160-9-4.8 MCG/ACT AERO Inhale 2 puffs into the lungs 2 (two) times daily. 10.7 g 11   buPROPion (WELLBUTRIN SR) 150 MG 12 hr tablet Take 150 mg by mouth 2 (two) times daily.     carvedilol (COREG) 3.125 MG tablet Take 1 tablet by mouth twice daily 180 tablet 2   Eszopiclone 3 MG TABS TAKE 1 TABLET BY MOUTH ONCE DAILY AT BEDTIME (TAKE  IMMEDIATELY  BEFORE  BEDTIME) 30 tablet 5   ezetimibe (ZETIA) 10 MG tablet Take 1 tablet (10 mg total) by mouth daily. 90 tablet 3   folic acid (FOLVITE) 1 MG tablet Take 1 tablet by mouth once daily 90 tablet 0   tadalafil (CIALIS) 5 MG tablet TAKE 1 TABLET BY  MOUTH ONCE DAILY FOR  FREQUENT  URINATION 90 tablet 3   telmisartan (MICARDIS) 20 MG tablet Take 1 tablet by mouth once daily for blood pressure 90 tablet 3   triamcinolone cream (KENALOG) 0.1 % Apply 1 Application topically 2 (two) times daily as needed. For rash. 30 g 0   No current facility-administered medications on file prior to visit.    BP 136/86 (BP Location: Left Arm, Patient Position: Sitting, Cuff Size: Normal)   Pulse 68   Temp 98 F (36.7 C) (Temporal)   Ht 5\' 5"  (1.651 m)   Wt 192 lb 9.6 oz (87.4 kg)   SpO2 97%   BMI 32.05 kg/m  Objective:   Physical Exam HENT:     Right Ear: Tympanic membrane and ear canal normal.     Left Ear: Tympanic membrane and ear canal normal.  Eyes:     Pupils: Pupils are equal, round, and reactive to light.  Cardiovascular:     Rate and Rhythm: Normal rate and regular rhythm.  Pulmonary:     Effort: Pulmonary effort is normal.     Breath sounds: Normal breath sounds.  Abdominal:     General: Bowel sounds are normal.     Palpations: Abdomen is soft.     Tenderness: There is no abdominal tenderness.  Musculoskeletal:        General: Normal range of motion.     Cervical back: Neck supple.  Skin:    General: Skin is warm and dry.  Neurological:     Mental Status: He is alert and oriented to person, place, and time.     Cranial Nerves: No cranial nerve deficit.     Deep Tendon Reflexes:     Reflex Scores:      Patellar reflexes are 2+ on the right side and 2+ on the left side. Psychiatric:        Mood and Affect: Mood normal.           Assessment & Plan:  Preventative health care Assessment & Plan:  Immunizations UTD. Colonoscopy UTD, due 2026 PSA due and pending.  Discussed the importance of a healthy diet and regular exercise in order for weight loss, and to reduce the risk of further co-morbidity.  Exam stable. Labs pending.  Follow up in 1 year for repeat physical.    Essential hypertension Assessment &  Plan: Over his typical normal today but not an emergent reading.  Following with cardiology, reviewed office notes from December 2024. Continue telmisartan 20 mg daily, carvedilol 3.125 mg BID.   Reviewed BMP from December 2024.  He will also start monitoring BP at home.   Paroxysmal atrial fibrillation (HCC) Assessment & Plan: Rate and rhythm regular. Reviewed remote pacemaker check from November 2024.  Continue carvedilol 3.125 mg BID.   COPD mixed type [facility]) Assessment & Plan: Stable.  Continue Bretri 160-9-4.8 mcg 2 puffs BID, albuterol inhaler PRN for which he uses sparingly.  Reviewed CT chest from June 2024.   OSA (obstructive sleep apnea) Assessment & Plan: Inspire in place. Following with pulmonology.   Gastroesophageal reflux disease, unspecified whether esophagitis present Assessment & Plan: Improved, but continues with active symptoms despite H2 blocker use.  Trial of pantoprazole 20 mg daily sent to pharmacy.  He will update.   Orders: -     Pantoprazole Sodium; Take 1 tablet (20 mg total) by mouth daily. for heartburn.  Dispense: 90 tablet; Refill: 0  Anxiety and depression Assessment & Plan: Stable per patient, no concerns today.  Continue bupropion SR 150 mg for which he is taking once daily. No longer following with therapy.   Continue to monitor. He will update if anything changes.    Mixed hyperlipidemia Assessment & Plan: Repeat lipid panel pending.   Continue atorvastatin 20 mg daily and Zetia 10 mg daily.  Orders: -     Lipid panel -     Hemoglobin A1c  Primary insomnia Assessment & Plan: Improving.  Inspire in place. Continue Lunesta 3 mg, discussed to use sparingly.   Following with pulmonology    Nocturia more than twice per night Assessment & Plan: Improved.  Continue tadalafil 5 mg daily.   Weak urinary stream Assessment & Plan: Improved.  Continue tadalafil 5 mg daily.   Osteoarthritis of glenohumeral  joint, left Assessment & Plan: Following with orthopedics.  Reviewed CT shoulder from October 2024.   Screening for prostate cancer -     PSA, Medicare        Doreene Nest, NP

## 2023-01-18 NOTE — Assessment & Plan Note (Signed)
Stable.  Continue Bretri 160-9-4.8 mcg 2 puffs BID, albuterol inhaler PRN for which he uses sparingly.  Reviewed CT chest from June 2024.

## 2023-01-18 NOTE — Assessment & Plan Note (Signed)
Improved.  Continue tadalafil 5 mg daily.

## 2023-01-18 NOTE — Assessment & Plan Note (Signed)
Repeat lipid panel pending.   Continue atorvastatin 20 mg daily and Zetia 10 mg daily.

## 2023-01-18 NOTE — Assessment & Plan Note (Signed)
Immunizations UTD. Colonoscopy UTD, due 2026 PSA due and pending.  Discussed the importance of a healthy diet and regular exercise in order for weight loss, and to reduce the risk of further co-morbidity.  Exam stable. Labs pending.  Follow up in 1 year for repeat physical.  

## 2023-01-18 NOTE — Assessment & Plan Note (Signed)
Rate and rhythm regular. Reviewed remote pacemaker check from November 2024.  Continue carvedilol 3.125 mg BID.

## 2023-01-18 NOTE — Assessment & Plan Note (Signed)
Stable per patient, no concerns today.  Continue bupropion SR 150 mg for which he is taking once daily. No longer following with therapy.   Continue to monitor. He will update if anything changes.

## 2023-01-18 NOTE — Patient Instructions (Signed)
Start pantoprazole 20 mg once daily with a meal for heartburn.  Stop by the lab prior to leaving today. I will notify you of your results once received.   It was a pleasure to see you today!

## 2023-01-18 NOTE — Assessment & Plan Note (Signed)
Inspire in place. Following with pulmonology.

## 2023-01-18 NOTE — Assessment & Plan Note (Signed)
Over his typical normal today but not an emergent reading.  Following with cardiology, reviewed office notes from December 2024. Continue telmisartan 20 mg daily, carvedilol 3.125 mg BID.   Reviewed BMP from December 2024.  He will also start monitoring BP at home.

## 2023-01-30 ENCOUNTER — Other Ambulatory Visit: Payer: Self-pay | Admitting: Primary Care

## 2023-01-30 DIAGNOSIS — F32A Depression, unspecified: Secondary | ICD-10-CM

## 2023-02-02 ENCOUNTER — Ambulatory Visit: Payer: PPO | Admitting: Orthopedic Surgery

## 2023-02-04 ENCOUNTER — Other Ambulatory Visit: Payer: Self-pay | Admitting: Primary Care

## 2023-02-04 DIAGNOSIS — I10 Essential (primary) hypertension: Secondary | ICD-10-CM

## 2023-02-04 DIAGNOSIS — G4733 Obstructive sleep apnea (adult) (pediatric): Secondary | ICD-10-CM

## 2023-02-04 DIAGNOSIS — J449 Chronic obstructive pulmonary disease, unspecified: Secondary | ICD-10-CM

## 2023-02-04 MED ORDER — TELMISARTAN 40 MG PO TABS
40.0000 mg | ORAL_TABLET | Freq: Every day | ORAL | 3 refills | Status: DC
Start: 2023-02-04 — End: 2023-02-17

## 2023-02-06 MED ORDER — BREZTRI AEROSPHERE 160-9-4.8 MCG/ACT IN AERO: 2.0000 | INHALATION_SPRAY | Freq: Two times a day (BID) | RESPIRATORY_TRACT | 11 refills | Status: DC

## 2023-02-13 ENCOUNTER — Other Ambulatory Visit: Payer: Self-pay | Admitting: Primary Care

## 2023-02-13 DIAGNOSIS — N401 Enlarged prostate with lower urinary tract symptoms: Secondary | ICD-10-CM

## 2023-02-16 ENCOUNTER — Other Ambulatory Visit: Payer: Self-pay | Admitting: Internal Medicine

## 2023-02-16 DIAGNOSIS — G4733 Obstructive sleep apnea (adult) (pediatric): Secondary | ICD-10-CM

## 2023-02-16 NOTE — Progress Notes (Signed)
HPI M former smoker followed for OSA, COPD (100 pkyrs, QS [id number]), Asbestos exposure, complicated by CHF/Pacemakert,, HTN, PAFib, COPD, HxTobacco Use, OSA, Diverticulosis, Osteoarthritis, ETOH, Anxiety/Depression, Hx Positive TB PPD,  -Ventolin hfa, Breztri,  NPSG [place] 09/10/15- AHI 44.3/ hr, desaturation to 83%, body weight 184 lbs, CPAP to 10 Inspire Hypoglossal Nerve Stimulator 01/05/22-Dr Genevie Cheshire Activation 02/09/22 HST(WatchPat/Itamar) with Inspire- 02/05/23- AHI 63, desat to 87%, body weight 192 lbs  On left side AHI 43/hr ===================================================================================================================  01/03/23- 67 yoM Smoker(118 pk yrs) followed for OSA, COPD (100 pkyrs, QS [id number]), Asbestos exposure, complicated by CHF/Pacemaker, ASCVD, HTN, PAFib, CAD, HxTobacco Use, OSA, Diverticulosis, Osteoarthritis, ETOH, Anxiety/Depression, Hx Positive TB PPD,  He had installed and removed asbestos and he had worked in [employer], in addition to a long smoking history. -Ventolin hfa, Breztri, Temazepam, CT chest low dose screen/ Asbestos exposure-   last on 06/26/22     Inspire Hypoglossal Nerve Stimulator placed 01/05/22-Dr Genevie Cheshire Activation 02/09/22 Inspire Sleep  Study Titration-05/18/22- Body weight today-191 lbs   BP 160/95 At last visit he was going to try building voltage tolerance toward 1.8V if possible, for better apnea control, work on smoking and ETOH, try temazepam for sleep (had been using lunesta 3), Discussed the use of AI scribe software for clinical note transcription with the patient, who gave verbal consent to proceed.  History of Present Illness   The patient, with a history of COPD and sleep apnea, has been using an Inspire  nerve stimulator and reports improved sleep over the past couple of weeks. However, the patient has been experiencing high blood pressure readings, reaching 160/90, and has been having nosebleeds in the  morning for the past week. The patient has an upcoming appointment with his primary care doctor to address these issues. The patient also uses an albuterol inhaler and Breztri for COPD. He has  Lunesta for sleep, but instead has been taking Unisom. He complains still of frequent waking on some nights, and doesn't recall impression of Lunesta. The patient has been using a device from [facility] for sleep apnea, which seems to be improving his sleep quality. The patient is scheduled for a home sleep test in the future.  Note- after going through standard Inspire assessment with team here, and still sitting in exam chair, he experienced a few seconds of feeling strange. Pulse remained regular at about 70/min and he was oriented and responsive. He says he has had many episodes like this, going back before Inspire implantation.    02/18/23-  67yoM ??Smoker(118 pk yrs) followed for OSA/ Inspire, COPD (100 pkyrs, QS [id number]), Asbestos exposure, complicated by CHF/Pacemaker, ASCVD, HTN, PAFib, CAD, HxTobacco Use, OSA, Diverticulosis, Osteoarthritis, ETOH, Anxiety/Depression, Hx Positive TB PPD,  He had installed and removed asbestos and he had worked in [employer], in addition to a long smoking history. -Ventolin hfa, Breztri, Lunesta 3, CT chest low dose screen/ Asbestos exposure-   last on 06/26/22     Inspire Hypoglossal Nerve Stimulator placed 01/05/22-Dr Genevie Cheshire Activation 02/09/22 Inspire Sleep  Study Titration-05/18/22- Body weight today-191 lbs   BP 160/95 ED 12/14 for HTN. HST(WatchPat/Itamar) with Inspire- AHI 63, desat to 87%, body weight 192 lbs   On left side AHI 43 CXR 01/08/23 IMPRESSION: 1. No acute cardiopulmonary process. 2. Moderate centrilobular and paraseptal emphysematous changes. Discussed the use of AI scribe software for clinical note transcription with the patient, who gave verbal consent to proceed.  History of Present Illness   The patient, with  a history of sleep apnea,  presents for adjustment of his Inspire device. He reports discomfort with the current settings, describing a sensation of his tongue being pulled back or to the side during device activation. The patient also notes that the device seems to be more effective on the right side than the left. He has been sleeping more on his left side recently, which he believes has improved his symptoms, although he notes that this position sometimes exacerbates his acid reflux. Overall, he reports sleeping better recently with fewer waking episodes. Asks refill Lunesta.  The patient also mentions a history of high blood pressure, which he is currently managing with medication. The [employer], [place], worked extensively with him today, trying many adjustments He is going to try to increase his voltage as tolerated, from 1.6V toward 1.9V. If necessary we will arrange for an awake upper airway endoscopy, to allow direct visualization during Inspire adjustments.      ROS   see HPI    + = positive HEENT:    headaches, difficulty swallowing, tooth/dental problems, sore throat,       sneezing, itching, ear ache, nasal congestion, post nasal drip, snoring CV:    chest pain, orthopnea, PND, swelling in lower extremities, anasarca,                                   dizziness, palpitations Resp:   +shortness of breath with exertion or at rest.                productive cough,   non-productive cough, coughing up of blood.              change in color of mucus.  wheezing.   Skin:    rash or lesions. GI:  +heartburn, indigestion, abdominal pain, nausea, vomiting, diarrhea,                 change in bowel habits, +loss of appetite GU: dysuria, change in color of urine, no urgency or frequency.   flank pain. MS:   joint pain, stiffness, decreased range of motion, back pain. Neuro-     nothing unusual Psych:  change in mood or affect.  depression or anxiety.   memory loss.  OBJ- Physical Exam   +obese General- Alert, Oriented,  Affect-appropriate, Distress- none Skin- rash-none, lesions- none, excoriation- none Lymphadenopathy- none Head- atraumatic            Eyes- Gross vision intact, PERRLA, conjunctivae and secretions clear            Ears- Hearing, canals-normal            Nose- Clear, no-Septal dev, mucus, polyps, erosion, perforation             Throat- Mallampati III , mucosa clear , drainage- none, tonsils- atrophic Neck- flexible , trachea midline, no stridor , thyroid nl, carotid no bruit Chest - symmetrical excursion , unlabored           Heart/CV- RRR , no murmur , no gallop  , no rub, nl s1 s2                           - JVD- none , edema- none, stasis changes- none, varices- none           Lung- +  no rhonchi, no distinct crackle,  wheeze-none, cough+light ,  dullness-none, rub- none           Chest wall- +pacemaker on right. + Inspire on left Abd-  Br/ Gen/ Rectal- Not done, not indicated Extrem- + nail spooning Neuro- grossly intact to observation  Assessment and Plan    Obstructive Sleep Apnea Patient has an Inspire device. Discussed the possibility of awake endoscopy to optimize device settings. Noted that tongue motion appears to have changed with device adjustments. Patient reports better sleep when lying on left side. -Continue current Inspire device settings. -Gradually increase device settings over the next 2 weeks. -Consider awake endoscopy if no improvement with current plan. -Sleep on left side as it appears to improve airway patency.  Hypertension Patient reports ongoing management of high blood pressure. -Continue current management plan.  Gastroesophageal Reflux Disease Patient reports history of acid reflux, particularly when sleeping on left side. Currently managed with medication. -Continue current medication regimen.  Follow-up in 8 weeks to reassess Inspire device settings and overall symptom management.

## 2023-02-17 ENCOUNTER — Ambulatory Visit (INDEPENDENT_AMBULATORY_CARE_PROVIDER_SITE_OTHER): Payer: PPO | Admitting: Primary Care

## 2023-02-17 ENCOUNTER — Encounter: Payer: Self-pay | Admitting: Pharmacist

## 2023-02-17 ENCOUNTER — Encounter: Payer: Self-pay | Admitting: Primary Care

## 2023-02-17 VITALS — BP 144/84 | HR 91 | Temp 97.3°F | Ht 65.0 in | Wt 192.0 lb

## 2023-02-17 DIAGNOSIS — I10 Essential (primary) hypertension: Secondary | ICD-10-CM

## 2023-02-17 MED ORDER — TELMISARTAN-HCTZ 40-12.5 MG PO TABS: 1.0000 | ORAL_TABLET | Freq: Every day | ORAL | 0 refills | Status: DC

## 2023-02-17 NOTE — Assessment & Plan Note (Signed)
Above goal today, also with home readings.  Stop telmisartan 40 mg daily. Start telmisartan-hydrochlorothiazide 40-12.5 mg daily as he did well on this historically. Continue carvedilol 3.125 mg twice daily.  We will plan to see him back in the office in 2 weeks for blood pressure check and BMP.

## 2023-02-17 NOTE — Progress Notes (Signed)
Subjective:    Patient ID: Brandon Lopez, male    DOB: Apr 27, 1955, 68 y.o.   MRN: [id number]  HPI  Brandon Lopez is a very pleasant 68 y.o. male with a history of hypertension, aortic atherosclerosis, OSA, COPD, alcohol abuse, tobacco use who presents today for follow up of hypertension.   Currently managed on carvedilol 3.125 mg BID, telmisartan 40 mg daily. Telmisartan was increased from 20 mg 2 weeks ago due to ongoing elevated blood pressure readings. Previously managed on telmisartan-hydrochlorothiazide 40-12.5 mg which caused hypotension.   Today he is compliant to telmisartan 40 mg daily and carvedilol 3.125 mg BID. He is checking his BP at home and is getting readings of 150-160/70-80. His heart rate is running in the 70-80 range. His BP cuff is 63-63 years old.   He continues to experience chronic, intermittent chest pain, no worse than usual. He typically notices hist chest pain when his BP is above goal. Cardiology is aware.   BP Readings from Last 3 Encounters:  02/17/23 (!) 144/84  01/18/23 136/86  01/14/23 130/88   Wt Readings from Last 3 Encounters:  02/17/23 192 lb (87.1 kg)  01/18/23 192 lb 9.6 oz (87.4 kg)  01/14/23 193 lb (87.5 kg)      Review of Systems  Respiratory:  Negative for shortness of breath.   Cardiovascular:  Positive for chest pain.  Neurological:  Negative for dizziness and headaches.         Past Medical History:  Diagnosis Date   Actinic keratosis    Arthritis    Chronic systolic CHF (congestive heart failure) (HCC)    a. echo 6/16: EF 55-60%, Gr1DD, LA nl, PASP nl; b. 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 short episodes previously noted on PPM interrogation; b. started on Xarelto 07/2017 given further episodes of Afib noted during interrogation; c. 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 Biotronik; Henrietta Hoover DR-T [id number]; placed 08/2012   Rectal bleeding 09/19/2018   Shortness of breath 04/21/2021   Skin lesions 07/03/2020   Sleep apnea    does not use CPAP   Symptomatic bradycardia    a. s/p Biotronik; Henrietta Hoover DR-T [id number]; placed 08/2012 at [facility]   Syncope 08/10/2017    Social History   Socioeconomic History   Marital status: Single    Spouse name: Not on file   Number of children: Not on file   Years of education: Not on file   Highest education level: GED or equivalent  Occupational History   Not on file  Tobacco Use   Smoking status: Every Day    Current packs/day: 0.00    Average packs/day: 2.0 packs/day for 59.0 years (118.0 ttl pk-yrs)  Types: Cigarettes    Start date: 1964    Last attempt to quit: 02/01/2021    Years since quitting: 2.0   Smokeless tobacco: Never   Tobacco comments:    Smoking 1 pack   Vaping Use   Vaping status: Never Used  Substance and Sexual Activity   Alcohol use: Yes    Alcohol/week: 8.0 - 14.0 standard drinks of alcohol    Types: 8 - 14 Cans of beer per week    Comment: daily beer   Drug use: Yes    Types: Marijuana    Comment: states smokes daily, last 11-09-21   Sexual activity: Yes  Other Topics Concern   Not on file  Social History Narrative   Not on file   Social Drivers of Health   Financial Resource Strain: Medium Risk (01/16/2023)   Overall  Financial Resource Strain (CARDIA)    Difficulty of Paying Living Expenses: Somewhat hard  Food Insecurity: Food Insecurity Present (01/16/2023)   Hunger Vital Sign    Worried About Running Out of Food in the Last Year: Sometimes true    Ran Out of Food in the Last Year: Never true  Transportation Needs: No Transportation Needs (01/16/2023)   PRAPARE - Administrator, Civil Service (Medical): No    Lack of Transportation (Non-Medical): No  Physical Activity: Inactive (01/16/2023)   Exercise Vital Sign    Days of Exercise per Week: 0 days    Minutes of Exercise per Session: 20 min  Stress: No Stress Concern Present (01/16/2023)   [employer] of Occupational Health - Occupational Stress Questionnaire    Feeling of Stress : Not at all  Recent Concern: Stress - Stress Concern Present (11/13/2022)   [employer] of Occupational Health - Occupational Stress Questionnaire    Feeling of Stress : To some extent  Social Connections: Socially Isolated (01/16/2023)   Social Connection and Isolation Panel [NHANES]    Frequency of Communication with Friends and Family: More than three times a week    Frequency of Social Gatherings with Friends and Family: Patient declined    Attends Religious Services: Never    Database administrator or Organizations: No    Attends Banker Meetings: Never    Marital Status: Divorced  Catering manager Violence: Not At Risk (07/28/2022)   Humiliation, Afraid, Rape, and Kick questionnaire    Fear of Current or Ex-Partner: No    Emotionally Abused: No    Physically Abused: No    Sexually Abused: No    Past Surgical History:  Procedure Laterality Date   APPENDECTOMY  age 55   CARDIAC CATHETERIZATION     7 years ago   COLONOSCOPY N/A 05/27/2014   Procedure: COLONOSCOPY;  Surgeon: Wallace Cullens, MD;  Location: [facility] END[facility]COPY;  Service: Gastroenterology;  Laterality: N/A;   DRUG INDUCED ENDOSCOPY Bilateral 11/11/2021   Procedure: DRUG  INDUCED ENDOSCOPY;  Surgeon: Christia Reading, MD;  Location: [facility];  Service: ENT;  Laterality: Bilateral;   FLOOR OF MOUTH BIOPSY N/A 01/05/2022   Procedure: BIOPSY OF PALATE;  Surgeon: Christia Reading, MD;  Location: [facility];  Service: ENT;  Laterality: N/A;   IMPLANTATION OF HYPOGLOSSAL NERVE STIMULATOR Left 01/05/2022   Procedure: IMPLANTATION OF HYPOGLOSSAL NERVE STIMULATOR;  Surgeon: Christia Reading, MD;  Location: [facility];  Service: ENT;  Laterality: Left;   INSERT / REPLACE / REMOVE PACEMAKER     3 years ago  Family History  Problem Relation Age of Onset   Arthritis Mother    Heart disease Father    Hyperlipidemia Father    Hypertension Father    Cancer Paternal Grandfather    Hyperlipidemia Brother     Allergies  Allergen Reactions   Simvastatin Diarrhea   Lisinopril Cough    Current Outpatient Medications on File Prior to Visit  Medication Sig Dispense Refill   albuterol (VENTOLIN HFA) 108 (90 Base) MCG/ACT inhaler INHALE 2 PUFFS BY MOUTH EVERY 6 HOURS AS NEEDED FOR WHEEZING AND FOR SHORTNESS OF BREATH 18 g 0   aspirin EC 81 MG tablet Take 81 mg by mouth daily. Swallow whole.     atorvastatin (LIPITOR) 20 MG tablet TAKE 1 TABLET BY MOUTH ONCE DAILY FOR CHOLESTEROL 90 tablet 0   Budeson-Glycopyrrol-Formoterol (BREZTRI AEROSPHERE) 160-9-4.8 MCG/ACT AERO Inhale 2 puffs into the lungs 2 (two) times daily. 10.7 g 11   buPROPion (WELLBUTRIN SR) 150 MG 12 hr tablet Take 1 tablet (150 mg total) by mouth daily. For depression 90 tablet 2   carvedilol (COREG) 3.125 MG tablet Take 1 tablet by mouth twice daily 180 tablet 2   Eszopiclone 3 MG TABS TAKE 1 TABLET BY MOUTH ONCE DAILY AT BEDTIME (TAKE  IMMEDIATELY  BEFORE  BEDTIME) 30 tablet 5   ezetimibe (ZETIA) 10 MG tablet Take 1 tablet (10 mg total) by mouth daily. 90 tablet 3   pantoprazole (PROTONIX) 20 MG tablet Take 1 tablet (20 mg total) by mouth daily. for heartburn. 90 tablet  0   tadalafil (CIALIS) 5 MG tablet TAKE 1 TABLET BY MOUTH ONCE DAILY FOR FREQUENT URINATION 90 tablet 2   folic acid (FOLVITE) 1 MG tablet Take 1 tablet by mouth once daily (Patient not taking: Reported on 02/17/2023) 90 tablet 0   triamcinolone cream (KENALOG) 0.1 % Apply 1 Application topically 2 (two) times daily as needed. For rash. (Patient not taking: Reported on 02/17/2023) 30 g 0   No current facility-administered medications on file prior to visit.    BP (!) 144/84   Pulse 91   Temp (!) 97.3 F (36.3 C) (Temporal)   Ht 5\' 5"  (1.651 m)   Wt 192 lb (87.1 kg)   SpO2 95%   BMI 31.95 kg/m  Objective:   Physical Exam Cardiovascular:     Rate and Rhythm: Normal rate and regular rhythm.  Pulmonary:     Effort: Pulmonary effort is normal.     Breath sounds: Normal breath sounds.  Musculoskeletal:     Cervical back: Neck supple.  Skin:    General: Skin is warm and dry.  Neurological:     Mental Status: He is alert and oriented to person, place, and time.  Psychiatric:        Mood and Affect: Mood normal.           Assessment & Plan:  Essential hypertension Assessment & Plan: Above goal today, also with home readings.  Stop telmisartan 40 mg daily. Start telmisartan-hydrochlorothiazide 40-12.5 mg daily as he did well on this historically. Continue carvedilol 3.125 mg twice daily.  We will plan to see him back in the office in 2 weeks for blood pressure check and BMP.  Orders: -     Telmisartan-HCTZ; Take 1 tablet by mouth daily. for blood pressure.  Dispense: 90 tablet; Refill: 0        Doreene Nest, NP

## 2023-02-17 NOTE — Progress Notes (Signed)
Manufacturer Assistance Program (MAP) Application   Manufacturer: [employer] ([employer])    (Re-enrollment) Medication(s): Breztri  Patient Portion of Application:  02/17/23:  Completed and uplaoded to clinic eFax folder for patient signature in front office. Plans to sign afternoon of 02/18/23 Income Documentation: N/A - Electronic verification elected.  Provider Portion of Application:  1/23:  Completed by Pharmacist. Kept with patient pages until patient signature received Prescription(s): Included in MAP application.   Application Status: Not submitted (pending signatures)  Next Steps: []    Patient signature (reports plan to sign in clinic front office 1/24)   Signed document to be placed in PCP folder for PCP signature []    PCP signature []    Upon PCP signature Application to be faxed to [employer] Fax: [phone] AND scanned into patient chart []    Program approval/denial received via fax and documented in chart  Forwarded to [facility] CPhT Patient Advocate Team for future correspondences/re-enrollment.  Note routed to PCP Clinic Pool to ensure PCP signature is obtained and application is faxed.  *[facility] clinic team - Please Addend/update this note as the "Next Steps" are completed in office*

## 2023-02-17 NOTE — Patient Instructions (Signed)
Stop taking telmisartan 40 mg daily for blood pressure.  Start taking telmisartan-hydrochlorothiazide 40-12.5 mg daily for blood pressure.  Continue taking carvedilol 3.125 mg twice daily for blood pressure.  Please schedule a follow up visit to meet back with me in 2-3 weeks for blood pressure check.   It was a pleasure to see you today!

## 2023-02-18 ENCOUNTER — Ambulatory Visit (INDEPENDENT_AMBULATORY_CARE_PROVIDER_SITE_OTHER): Payer: PPO | Admitting: Internal Medicine

## 2023-02-18 ENCOUNTER — Encounter: Payer: Self-pay | Admitting: Internal Medicine

## 2023-02-18 ENCOUNTER — Telehealth: Payer: Self-pay

## 2023-02-18 VITALS — BP 128/86 | HR 72 | Ht 65.0 in | Wt 191.0 lb

## 2023-02-18 DIAGNOSIS — I10 Essential (primary) hypertension: Secondary | ICD-10-CM | POA: Diagnosis not present

## 2023-02-18 DIAGNOSIS — G4733 Obstructive sleep apnea (adult) (pediatric): Secondary | ICD-10-CM

## 2023-02-18 DIAGNOSIS — K219 Gastro-esophageal reflux disease without esophagitis: Secondary | ICD-10-CM

## 2023-02-18 MED ORDER — ESZOPICLONE 3 MG PO TABS: 3.0000 mg | ORAL_TABLET | Freq: Every day | ORAL | 5 refills | Status: DC

## 2023-02-18 NOTE — Patient Instructions (Addendum)
Brandon Lopez will work with Dr Vassie Loll, another of our sleep doctors to schedule an awake endoscopy to assess different Inspire adjustments in the future.  You can try increasing Inspire as tolerated (at 2 week intervals) and spend as much time as you can on your left side.

## 2023-02-18 NOTE — Progress Notes (Unsigned)
Pharmacy Medication Assistance Program Note    02/18/2023  Patient ID: Brandon Lopez, male   DOB: 06-22-1955, 68 y.o.   MRN: [id number]     02/18/2023  Outreach Medication One  Initial Outreach Date (Medication One) 02/17/2023  Manufacturer Medication One Art therapist [employer] [facility] Bretztri  Dose of Breztri 160-9-4.8 MCG  Type of Assistance Manufacturer Assistance  Date Application Sent to Patient 02/17/2023  Application Items Requested Application;Proof of Income  Date Application Sent to Prescriber 02/17/2023     Signature

## 2023-02-18 NOTE — Progress Notes (Signed)
Form printed and placed in box up front for patient to sign, when he come in.

## 2023-02-18 NOTE — Telephone Encounter (Signed)
PAP: Patient assistance application for Breztri through [employer] ([employer])  WITH PT AT  OFFICE FOR PENDING SIGNATURE    PLEASES BE ADVISED

## 2023-02-22 NOTE — Telephone Encounter (Signed)
PAP: Patient assistance application for Brandon Lopez has been approved by PAP Companies: [employer] from 11/26/2022 to 01/25/2024. Medication should be delivered to PAP Delivery: Home. For further shipping updates, please contact [employer] ([employer]) at [phone]. Patient ID is: no id   PLEASE BE ADVISED

## 2023-02-23 ENCOUNTER — Ambulatory Visit: Payer: PPO | Admitting: Orthopedic Surgery

## 2023-02-23 NOTE — Progress Notes (Signed)
Per patient and MyChart message patient no longer needs to sign paperwork, he is already approved through 01/25/24.  Font Staff- You can disregard the paperwork you were holding for patient to come and sign.

## 2023-03-08 ENCOUNTER — Ambulatory Visit (INDEPENDENT_AMBULATORY_CARE_PROVIDER_SITE_OTHER): Payer: PPO

## 2023-03-08 DIAGNOSIS — I495 Sick sinus syndrome: Secondary | ICD-10-CM | POA: Diagnosis not present

## 2023-03-08 DIAGNOSIS — I480 Paroxysmal atrial fibrillation: Secondary | ICD-10-CM

## 2023-03-10 LAB — CUP PACEART REMOTE DEVICE CHECK
Date Time Interrogation Session: 20250212095723
Implantable Lead Connection Status: 753985
Implantable Lead Connection Status: 753985
Implantable Lead Implant Date: 20140801
Implantable Lead Implant Date: 20140801
Implantable Lead Location: 753859
Implantable Lead Location: 753860
Implantable Lead Model: 362
Implantable Lead Serial Number: 29356730
Implantable Lead Serial Number: 29392661
Implantable Pulse Generator Implant Date: 20140801
Pulse Gen Serial Number: 68053398

## 2023-03-17 ENCOUNTER — Other Ambulatory Visit: Payer: Self-pay | Admitting: Internal Medicine

## 2023-03-28 ENCOUNTER — Encounter: Payer: Self-pay | Admitting: Internal Medicine

## 2023-03-29 ENCOUNTER — Other Ambulatory Visit: Payer: Self-pay | Admitting: Primary Care

## 2023-03-29 DIAGNOSIS — E785 Hyperlipidemia, unspecified: Secondary | ICD-10-CM

## 2023-04-14 NOTE — Progress Notes (Unsigned)
 HPI M former smoker followed for OSA, COPD (100 pkyrs, QS [id number]), Asbestos exposure, complicated by CHF/Pacemakert,, HTN, PAFib, COPD, HxTobacco Use, OSA, Diverticulosis, Osteoarthritis, ETOH, Anxiety/Depression, Hx Positive TB PPD,  -Ventolin hfa, Breztri,  NPSG [place] 09/10/15- AHI 44.3/ hr, desaturation to 83%, body weight 184 lbs, CPAP to 10 Inspire Hypoglossal Nerve Stimulator 01/05/22-Dr Genevie Cheshire Activation 02/09/22 HST(WatchPat/Itamar) with Inspire- 02/05/23- AHI 63, desat to 87%, body weight 192 lbs  On left side AHI 43/hr ===================================================================================================================    02/18/23-  67yoM ??Smoker(118 pk yrs) followed for OSA/ Inspire, COPD (100 pkyrs, QS [id number]), Asbestos exposure, complicated by CHF/Pacemaker, ASCVD, HTN, PAFib, CAD, HxTobacco Use, OSA, Diverticulosis, Osteoarthritis, ETOH, Anxiety/Depression, Hx Positive TB PPD,  He had installed and removed asbestos and he had worked in [employer], in addition to a long smoking history. -Ventolin hfa, Breztri, Lunesta 3, CT chest low dose screen/ Asbestos exposure-   last on 06/26/22     Inspire Hypoglossal Nerve Stimulator placed 01/05/22-Dr Genevie Cheshire Activation 02/09/22 Inspire Sleep  Study Titration-05/18/22- Body weight today-191 lbs   BP 160/95 ED 12/14 for HTN. HST(WatchPat/Itamar) with Inspire- AHI 63, desat to 87%, body weight 192 lbs   On left side AHI 43 CXR 01/08/23 IMPRESSION: 1. No acute cardiopulmonary process. 2. Moderate centrilobular and paraseptal emphysematous changes. Discussed the use of AI scribe software for clinical note transcription with the patient, who gave verbal consent to proceed.  History of Present Illness   The patient, with a history of sleep apnea, presents for adjustment of his Inspire device. He reports discomfort with the current settings, describing a sensation of his tongue being pulled back or to the side  during device activation. The patient also notes that the device seems to be more effective on the right side than the left. He has been sleeping more on his left side recently, which he believes has improved his symptoms, although he notes that this position sometimes exacerbates his acid reflux. Overall, he reports sleeping better recently with fewer waking episodes. Asks refill Lunesta.  The patient also mentions a history of high blood pressure, which he is currently managing with medication. The [employer], [place], worked extensively with him today, trying many adjustments He is going to try to increase his voltage as tolerated, from 1.6V toward 1.9V. If necessary we will arrange for an awake upper airway endoscopy, to allow direct visualization during Inspire adjustments.     Assessment and Plan:    Obstructive Sleep Apnea Patient has an Inspire device. Discussed the possibility of awake endoscopy to optimize device settings. Noted that tongue motion appears to have changed with device adjustments. Patient reports better sleep when lying on left side. -Continue current Inspire device settings. -Gradually increase device settings over the next 2 weeks. -Consider awake endoscopy if no improvement with current plan. -Sleep on left side as it appears to improve airway patency.  Hypertension Patient reports ongoing management of high blood pressure. -Continue current management plan.  Gastroesophageal Reflux Disease Patient reports history of acid reflux, particularly when sleeping on left side. Currently managed with medication. -Continue current medication regimen.  Follow-up in 8 weeks to reassess Inspire device settings and overall symptom management.   04/15/23-  67yoM ??Smoker(118 pk yrs) followed for OSA/ Inspire, COPD (100 pkyrs, QS [id number]), Asbestos exposure, complicated by CHF/Pacemaker, ASCVD, HTN, PAFib, CAD, HxTobacco Use, OSA, Diverticulosis, Osteoarthritis, ETOH,  Anxiety/Depression, Hx Positive TB PPD,  He had installed and removed asbestos and he had worked in [employer], in addition to  a long smoking history. -Ventolin hfa, Breztri, Lunesta 3, CT chest low dose screen/ Asbestos exposure-   last on 06/26/22     Inspire Hypoglossal Nerve Stimulator placed 01/05/22-Dr Genevie Cheshire Activation 02/09/22 Inspire Sleep  Study Titration-05/18/22- HST(WatchPat/Itamar) with Inspire- AHI 63, desat to 87%, body weight 192 lbs   On left side AHI 43 CXR 01/08/23 Body weight today-189 lbs Still smoking against advice- discussed. Discussed the use of AI scribe software for clinical note transcription with the patient, who gave verbal consent to proceed. History of Present Illness   The patient, with a history of sleep apnea managed with an Inspire device, reports significant improvement in sleep quality. He now wakes up only once a night, compared to every two hours previously. The patient is satisfied with the current settings of the device (1.7V) but is open to increasing the voltage to potentially further improve sleep quality. His previous sleep study did not show good apnea control at this voltage level, so we hope he can go higher.  In addition to sleep apnea, the patient experiences occasional dizzy spells. He attributes these episodes to changes in his blood pressure medication and occasional low blood pressure readings. The patient reports that these dizzy spells usually resolve by mid-morning.  The patient is an active  smoker . It is possible nicotine withdrawal at night contributes to fragmented sleep. He was counseled again. The patient is also physically active, as evidenced by his ongoing home renovation project.      ROS   see HPI    + = positive HEENT:    headaches, difficulty swallowing, tooth/dental problems, sore throat,       sneezing, itching, ear ache, nasal congestion, post nasal drip, snoring CV:    chest pain, orthopnea, PND, swelling in  lower extremities, anasarca,                                   dizziness, palpitations Resp:   +shortness of breath with exertion or at rest.                productive cough,   non-productive cough, coughing up of blood.              change in color of mucus.  wheezing.   Skin:    rash or lesions. GI:  +heartburn, indigestion, abdominal pain, nausea, vomiting, diarrhea,                 change in bowel habits, +loss of appetite GU: dysuria, change in color of urine, no urgency or frequency.   flank pain. MS:   joint pain, stiffness, decreased range of motion, back pain. Neuro-     nothing unusual Psych:  change in mood or affect.  depression or anxiety.   memory loss.  OBJ- Physical Exam   +obese General- Alert, Oriented, Affect-appropriate, Distress- none Skin- rash-none, lesions- none, excoriation- none Lymphadenopathy- none Head- atraumatic            Eyes- Gross vision intact, PERRLA, conjunctivae and secretions clear            Ears- Hearing, canals-normal            Nose- Clear, no-Septal dev, mucus, polyps, erosion, perforation             Throat- Mallampati III , mucosa clear , drainage- none, tonsils- atrophic Neck- flexible , trachea midline, no stridor , thyroid  nl, carotid no bruit Chest - symmetrical excursion , unlabored           Heart/CV- RRR , no murmur , no gallop  , no rub, nl s1 s2                           - JVD- none , edema- none, stasis changes- none, varices- none           Lung- +  no rhonchi, no distinct crackle,  wheeze-none, cough+light , dullness-none, rub- none           Chest wall- +pacemaker on right. + Inspire on left Abd-  Br/ Gen/ Rectal- Not done, not indicated Extrem- + nail spooning Neuro- grossly intact to observation Assessment and Plan:    Obstructive Sleep Apnea Inspire device use improved sleep quality. Now setting voltage increase to 1.8V for further enhancement. No current discomfort. Potential for subconscious arousals with higher  voltage. - Increase Inspire device voltage by one level and monitor changes in sleep quality. - Advise monitoring for increased nighttime awakenings or discomfort and adjust settings accordingly. - Schedule follow-up in six months to assess device efficacy and comfort.  Hypertension Blood pressure improved to 110/78 mmHg. Previous dizziness likely due to low blood pressure from medication changes. - Continue current antihypertensive regimen. - Monitor blood pressure regularly and report any dizziness.   Tobacco Abuse - Continue counseling and support to quit -Consider when to update imaging.

## 2023-04-15 ENCOUNTER — Encounter: Payer: Self-pay | Admitting: Internal Medicine

## 2023-04-15 ENCOUNTER — Ambulatory Visit: Payer: PPO | Admitting: Internal Medicine

## 2023-04-15 VITALS — BP 110/78 | HR 74 | Temp 97.7°F | Ht 65.0 in | Wt 189.6 lb

## 2023-04-15 DIAGNOSIS — G4733 Obstructive sleep apnea (adult) (pediatric): Secondary | ICD-10-CM | POA: Diagnosis not present

## 2023-04-15 NOTE — Patient Instructions (Signed)
 You are at 1.8V now- advance another step next week if tolerated.  Please try to stop smoking- this is important.

## 2023-04-18 ENCOUNTER — Other Ambulatory Visit: Payer: Self-pay | Admitting: Primary Care

## 2023-04-18 DIAGNOSIS — K219 Gastro-esophageal reflux disease without esophagitis: Secondary | ICD-10-CM

## 2023-04-18 NOTE — Addendum Note (Signed)
 Addended by: Geralyn Flash D on: 04/18/2023 01:44 PM   Modules accepted: Orders

## 2023-04-18 NOTE — Progress Notes (Signed)
 Remote pacemaker transmission.

## 2023-04-21 ENCOUNTER — Ambulatory Visit: Payer: PPO | Admitting: Dermatology

## 2023-04-29 NOTE — Progress Notes (Signed)
 Cardiology Clinic Note   Date: 05/05/2023 ID: Brandon Lopez, DOB [birth year], MRN [id number]  Primary Cardiologist:  Julien Nordmann, MD  Chief Complaint   Brandon Lopez is a 68 y.o. male who presents to the clinic today for routine follow-up.  Patient Profile   Brandon Lopez is followed by Dr. Mariah Milling and Dr. Graciela Husbands for the history outlined below.      Past medical history significant for: Nonobstructive CAD. Nuclear stress test 08/26/2017: Low risk scan without ischemia. Chronic diastolic heart failure/nonischemic cardiomyopathy. Echo 04/22/2021: EF 60 to 65%.  No RWMA.  Grade I DD.  Normal RV size/function.  Normal PA pressure.  RVSP 21.6 mmHg.  Mild MR. PAF. Symptomatic bradycardia. S/p PPM implantation 2014 at [facility]. Remote device check 03/10/2023: Normal device function.  Battery status good.  Lead measurements unchanged. Hypertension. Hyperlipidemia. Lipid panel 01/12/2022: Direct LDL 84, HDL 41, TG 259, total 153. OSA. S/p inspire insertion December 2023. COPD. GERD. Tobacco abuse. Alcohol use. Carotid artery disease. Carotid duplex 08/31/2017: Bilateral ICA 1-39%.  In summary, patient was previously followed at [facility].  He underwent PPM implantation in 2014 for symptomatic bradycardia with heart rate in the 40s.  He established care with Dr. Graciela Husbands in May 2016.  At that time he complained of DOE.  Myoview showed no ischemia with EF 50%.  In December 2017 he was noted to have A-fib on routine follow-up.  Given CHA2DS2-VASc of 1 he was not placed on anticoagulation.  In July 2019 he underwent hospital admission for syncopal episode that was preceded by chest pressure.  Device interrogation showed 18 episodes of A-fib with RVR for total of 3 hours.  His initial workup was essentially unremarkable other than hypokalemia.  Echo showed EF 40 to 45%, unable to exclude RWMA, Grade I DD, RVSF normal.  He was started on Xarelto and  aspirin was discontinued.  He was also started on Toprol.  Outpatient nuclear stress test was a low risk scan without ischemia.  Upon follow-up with Dr. Graciela Husbands in September 2019 anticoagulation was discontinued secondary to diagnosis of subclinical A-fib.   Patient presented to the ED on 01/08/2023 with complaints of elevated BP and chest pain. He had been seen by PCP earlier in the week and BP meds were adjusted. Workup was unremarkable with normal kidney function, electrolytes, blood counts. Troponin negative x 2. Chest x-ray without acute cardiopulmonary process.   Patient was last seen in the office by me on 01/14/2023 for hospital follow-up.  Patient reported normal BP ~125/70.  He stated he had been getting readings "in the triple digits top and bottom numbers."  He did not provide logs of readings.  He reported chest tightness only occurs with elevated BP reading.  He reported smoking 1+ packs per day and drinking 3-8 beers nightly.  PCP had increased olmesartan but BP became too low so he went back to previous dose.  He declined further medication changes.  He was instructed to continue monitoring BP.     History of Present Illness    Today, patient reports he is doing well. Patient denies shortness of breath, dyspnea on exertion, lower extremity edema, orthopnea or PND. No chest pain, pressure, or tightness. No palpitations.  He reports about a week or 2 ago he had an episode of dizziness upon awakening that lasted 5 to 6 hours.  It felt like he was spinning.  This is similar to prior episodes of vertigo.  He is due  for a visit with PCP and will mention this to her.  He is very active during general contracting.  He reports it is going to be a busy summer for him.  He continues to smoke and consume alcohol daily.  He is working on establishing with a therapist to address depression/anxiety issues.    ROS: All other systems reviewed and are otherwise negative except as noted in History of Present  Illness.  EKGs/Labs Reviewed        01/08/2023: BUN 10; Creatinine, Ser 0.83; Potassium 4.0; Sodium 134   01/08/2023: Hemoglobin 16.9; WBC 9.6   No results found for requested labs within last 365 days.   08/03/2022: Pro B Natriuretic peptide (BNP) 26.0   Risk Assessment/Calculations     CHA2DS2-VASc Score = 2   This indicates a 2.2% annual risk of stroke. The patient's score is based upon: CHF History: 0 HTN History: 1 Diabetes History: 0 Stroke History: 0 Vascular Disease History: 0 Age Score: 1 Gender Score: 0             Physical Exam    VS:  BP 104/60   Pulse 75   Ht 5\' 5"  (1.651 m)   Wt 186 lb 3.2 oz (84.5 kg)   SpO2 95%   BMI 30.99 kg/m  , BMI Body mass index is 30.99 kg/m.  GEN: Well nourished, well developed, in no acute distress. Neck: No JVD or carotid bruits. Cardiac:  RRR. No murmurs. No rubs or gallops.   Respiratory:  Respirations regular and unlabored. Clear to auscultation without rales, wheezing or rhonchi. GI: Soft, nontender, nondistended. Extremities: Radials/DP/PT 2+ and equal bilaterally. No clubbing or cyanosis. No edema.  Skin: Warm and dry, no rash. Neuro: Strength intact.  Assessment & Plan   Nonobstructive CAD Nuclear stress test August 2019 was a low risk study without ischemia.  Patient denies further episodes of chest pain.  He works a labor-intensive job as a Surveyor, minerals and is able to do all of his duties with no limitations. -Continue aspirin, carvedilol, atorvastatin, Zetia.   Chronic diastolic heart failure Echo March 2023 showed normal LV/RV function, Grade I DD, Grade I DD, normal PA pressure.  Patient denies shortness of breath, DOE, orthopnea or PND.  Euvolemic and well compensated on exam. -Continue telmisartan, carvedilol.   PAF No PAF detected on last remote device check.  Patient denies palpitations. -Continue aspirin, carvedilol.   Hypertension BP today 100/60. -Continue carvedilol, telmisartan.    Hyperlipidemia LDL 84 December 2023. -Continue atorvastatin and Zetia. -He will left PCP check cholesterol at next visit.   Tobacco abuse Patient continues to smoke 1+ pack per day. He is not interested in quitting at this time. -Work on systematic reduction of cigarettes until complete cessation.    Alcohol use Patient drinks 3-8 beers per night. He is not interested in changing this. -Encouraged decreasing alcohol intake.   Disposition: Return in 6 months or sooner if needed.         Signed, Etta Grandchild. Graison Leinberger, DNP, NP-C

## 2023-05-03 ENCOUNTER — Encounter: Payer: Self-pay | Admitting: Internal Medicine

## 2023-05-04 NOTE — Progress Notes (Unsigned)
 Electrophysiology Clinic Note    Date:  05/04/2023  Patient ID:  Brandon Lopez, Brandon Lopez [birth year], MRN [id number] PCP:  Doreene Nest, NP  Cardiologist:  Julien Nordmann, MD Electrophysiologist: Sherryl Manges, MD  ***refresh  Discussed the use of AI scribe software for clinical note transcription with the patient, who gave verbal consent to proceed.   Patient Profile    Chief Complaint: 1 year PPM follow-up  History of Present Illness: Brandon Lopez is a 68 y.o. male with PMH notable for SCAF, PVCs, HFpEF, HTN, SND s/p PPM, orthostatic intolerance, OSA s/p inspira, ETOH abuse, tobacco use; seen today for Sherryl Manges, MD for routine electrophysiology followup.   I last saw him 04/2022 where he unfortunately had resumed smoking cigarettes, and some concerns for depression. No cardiac complaints.    On follow-up today, *** AF burden, symptoms *** palpitations *** bleeding concerns    Since last being seen in our clinic the patient reports doing ***.  he denies chest pain, palpitations, dyspnea, PND, orthopnea, nausea, vomiting, dizziness, syncope, edema, weight gain, or early satiety.      Arrhythmia/Device History Biotronik dual chamber PPM, imp 08/2012; dx SND, bradycardia      ROS:  Please see the history of present illness. All other systems are reviewed and otherwise negative.    Physical Exam    VS:  There were no vitals taken for this visit. BMI: There is no height or weight on file to calculate BMI.  Wt Readings from Last 3 Encounters:  04/15/23 189 lb 9.6 oz (86 kg)  02/18/23 191 lb (86.6 kg)  02/17/23 192 lb (87.1 kg)     GEN- The patient is well appearing, alert and oriented x 3 today.   Lungs- Clear to ausculation bilaterally, normal work of breathing.  Heart- {Blank single:19197::"Regular","Irregularly irregular"} rate and rhythm, no murmurs, rubs or gallops Extremities- {EDEMA LEVEL:28147::"No"} peripheral edema, warm, dry Skin-   *** device pocket well-healed, no tethering   Device interrogation done today and reviewed by myself:  Battery *** Lead thresholds, impedence, sensing stable *** *** episodes *** changes made today   Studies Reviewed   Previous EP, cardiology notes.    EKG is ordered. Personal review of EKG from today shows:  ***        TTE, 04/22/2021  1. Left ventricular ejection fraction, by estimation, is 60 to 65%. The left ventricle has normal function. The left ventricle has no regional wall motion abnormalities. Left ventricular diastolic parameters are consistent with Grade I diastolic dysfunction (impaired relaxation).   2. Right ventricular systolic function is normal. The right ventricular size is normal. There is normal pulmonary artery systolic pressure. The estimated right ventricular systolic pressure is 21.6 mmHg.   3. The mitral valve is normal in structure. Mild mitral valve regurgitation. No evidence of mitral stenosis.   4. The aortic valve was not well visualized. Aortic valve regurgitation is not visualized. No aortic stenosis is present.   5. The inferior vena cava is normal in size with greater than 50% respiratory variability, suggesting right atrial pressure of 3 mmHg.   NM Myocardial spect, 08/26/2016 Pharmacological myocardial perfusion imaging study with no significant  Ischemia Small region mild fixed defect apical wall on attenuation corrected images, unable to exclude processing air No apical defect noted on non-attenuation corrected images Non-attenuation corrected images with diaphragmatic attenuation/mild fixed decreased inferior wall perfusion. Defect resolved with attenuation correction Normal wall motion, EF estimated at 57% No EKG changes  concerning for ischemia at peak stress or in recovery. Low risk scan   TTE, 08/11/2017 - Left ventricle: The cavity size was normal. Systolic function was mildly to moderately reduced. The estimated ejection fraction was in the  range of 40% to 45%. Regional wall motion abnormalities cannot be excluded. Doppler parameters are consistent with abnormal left ventricular relaxation (grade 1 diastolic dysfunction).  - Left atrium: The atrium was normal in size.  - Right ventricle: Pacer wire or catheter noted in right ventricle. Systolic function was normal.  - Pulmonary arteries: Systolic pressure was within the normal range.    Assessment and Plan     #) SND s/p Biotronik dual chamber PPM Device functioning well, see paceart for details   #) SCAD #) OSA s/p inspira No sustained AF episodes, longest episode Overall burden 0%  No OAC d/t SCAF   Significantly fewer episodes since Inspira insertion Cont coreg    #) HFpEF NYHA class 2 symptoms Warm and dry on exam today GDMT includes coreg, telmisartan; consider spiro at follow-up   #) ETOH use #) tobacco abuse #) depression Encouraged to reduce etoh and stop smoking Encouraged to discuss depression treatments with PCP, which he intends to do   #) ***   #) ***   {Are you ordering a CV Procedure (e.g. stress test, cath, DCCV, TEE, etc)?   Press F2        :[id number]}   Current medicines are reviewed at length with the patient today.   The patient {ACTIONS; HAS/DOES NOT HAVE:19233} concerns regarding his medicines.  The following changes were made today:  {NONE DEFAULTED:18576}  Labs/ tests ordered today include: *** No orders of the defined types were placed in this encounter.    Disposition: Follow up with {EPMDS:28135} or EP APP {EPFOLLOW UP:28173}   Signed, Sherie Don, NP  05/04/23  9:47 AM  Electrophysiology [facility]

## 2023-05-05 ENCOUNTER — Encounter: Payer: Self-pay | Admitting: Student

## 2023-05-05 ENCOUNTER — Ambulatory Visit: Attending: Cardiology | Admitting: Cardiology

## 2023-05-05 ENCOUNTER — Encounter: Payer: Self-pay | Admitting: Cardiology

## 2023-05-05 ENCOUNTER — Ambulatory Visit: Admitting: Student

## 2023-05-05 VITALS — BP 104/60 | HR 75 | Ht 65.0 in | Wt 186.2 lb

## 2023-05-05 VITALS — BP 100/60 | HR 75 | Ht 65.0 in | Wt 186.1 lb

## 2023-05-05 DIAGNOSIS — I10 Essential (primary) hypertension: Secondary | ICD-10-CM

## 2023-05-05 DIAGNOSIS — I480 Paroxysmal atrial fibrillation: Secondary | ICD-10-CM

## 2023-05-05 DIAGNOSIS — E785 Hyperlipidemia, unspecified: Secondary | ICD-10-CM

## 2023-05-05 DIAGNOSIS — I2510 Atherosclerotic heart disease of native coronary artery without angina pectoris: Secondary | ICD-10-CM | POA: Diagnosis not present

## 2023-05-05 DIAGNOSIS — Z950 Presence of cardiac pacemaker: Secondary | ICD-10-CM

## 2023-05-05 DIAGNOSIS — F1090 Alcohol use, unspecified, uncomplicated: Secondary | ICD-10-CM

## 2023-05-05 DIAGNOSIS — I5032 Chronic diastolic (congestive) heart failure: Secondary | ICD-10-CM | POA: Diagnosis not present

## 2023-05-05 DIAGNOSIS — I495 Sick sinus syndrome: Secondary | ICD-10-CM

## 2023-05-05 DIAGNOSIS — Z720 Tobacco use: Secondary | ICD-10-CM | POA: Diagnosis not present

## 2023-05-05 LAB — CUP PACEART INCLINIC DEVICE CHECK
Battery Remaining Percentage: 35 %
Brady Statistic RA Percent Paced: 94 %
Brady Statistic RV Percent Paced: 1 %
Date Time Interrogation Session: 20250410103936
Implantable Lead Connection Status: 753985
Implantable Lead Connection Status: 753985
Implantable Lead Implant Date: 20140801
Implantable Lead Implant Date: 20140801
Implantable Lead Location: 753859
Implantable Lead Location: 753860
Implantable Lead Model: 362
Implantable Lead Serial Number: 29356730
Implantable Lead Serial Number: 29392661
Implantable Pulse Generator Implant Date: 20140801
Lead Channel Impedance Value: 585 Ohm
Lead Channel Impedance Value: 819 Ohm
Lead Channel Setting Pacing Amplitude: 2.4 V
Lead Channel Setting Pacing Amplitude: 3 V
Lead Channel Setting Pacing Pulse Width: 0.4 ms
Lead Channel Setting Sensing Sensitivity: 2.5 mV
Pulse Gen Serial Number: 68053398

## 2023-05-05 NOTE — Patient Instructions (Signed)
 Medication Instructions:   Your Physician recommend you continue on your current medication as directed.     *If you need a refill on your cardiac medications before your next appointment, please call your pharmacy*  Lab Work:  No labs ordered today.  Testing/Procedures:  No test ordered today.  Follow-Up: At [facility], you and your health needs are our priority.  As part of our continuing mission to provide you with exceptional heart care, our providers are all part of one team.  This team includes your primary Cardiologist (physician) and Advanced Practice Providers or APPs (Physician Assistants and Nurse Practitioners) who all work together to provide you with the care you need, when you need it.  Your next appointment:   6 months  Provider:   Sherie Don, NP  We recommend signing up for the patient portal called "MyChart".  Sign up information is provided on this After Visit Summary.  MyChart is used to connect with patients for Virtual Visits (Telemedicine).  Patients are able to view lab/test results, encounter notes, upcoming appointments, etc.  Non-urgent messages can be sent to your provider as well.   To learn more about what you can do with MyChart, go to [email or web address].

## 2023-05-05 NOTE — Patient Instructions (Signed)
 Medication Instructions:  Your Physician recommend you continue on your current medication as directed.    *If you need a refill on your cardiac medications before your next appointment, please call your pharmacy*  Lab Work: None ordered at this time   Follow-Up: At [facility], you and your health needs are our priority.  As part of our continuing mission to provide you with exceptional heart care, our providers are all part of one team.  This team includes your primary Cardiologist (physician) and Advanced Practice Providers or APPs (Physician Assistants and Nurse Practitioners) who all work together to provide you with the care you need, when you need it.  Your next appointment:   6 month(s)  Provider:   You may see Julien Nordmann, MD or Carlos Levering, NP

## 2023-05-06 ENCOUNTER — Encounter: Payer: Self-pay | Admitting: Emergency Medicine

## 2023-05-06 ENCOUNTER — Ambulatory Visit
Admission: EM | Admit: 2023-05-06 | Discharge: 2023-05-06 | Disposition: A | Discharge status: Home / Self Care | Attending: Emergency Medicine | Admitting: Emergency Medicine

## 2023-05-06 ENCOUNTER — Other Ambulatory Visit: Payer: Self-pay

## 2023-05-06 DIAGNOSIS — S51012A Laceration without foreign body of left elbow, initial encounter: Secondary | ICD-10-CM

## 2023-05-06 MED ORDER — CEPHALEXIN 500 MG PO CAPS: 500.0000 mg | ORAL_CAPSULE | Freq: Two times a day (BID) | ORAL | 0 refills | Status: AC

## 2023-05-06 NOTE — Discharge Instructions (Signed)
 Your evaluated for the skin tear on your left arm, due to the size may take 2 to 3 weeks to fully heal  Wound has been cleansed and covered with a nonstick dressing  Cleanse once a day with unscented soap and water, tap and do not rub then applied nonstick dressing and Coban to hold in place  If at home and relaxing throughout the day, may use wound open to air otherwise cover with dressing  You may apply topical antibiotic ointment once a day after oral antibiotic has been finished  Due to size and placement over the joint do not want area to become infected therefore you have been started on antibiotic take cephalexin twice daily for 5 days  If you have any concerns regarding healing please follow-up with urgent care or your primary doctor for reevaluation

## 2023-05-06 NOTE — ED Provider Notes (Signed)
 Brandon Lopez    CSN: [id number] Arrival date & time: 05/06/23  1032      History   Chief Complaint Chief Complaint  Patient presents with   Fall    HPI Brandon Lopez is a 68 y.o. male.   Patient presents for evaluation of the wound to the left elbow beginning 1 day ago after fall.  Endorses that he fell back landing on the left side, denies hitting head but unsure if he lost consciousness.  Denies use of blood thinners but does take baby aspirin.  Has cleansed wound and covered with dressing but area is still bleeding.  Has full range of motion of the arm.  Past Medical History:  Diagnosis Date   Actinic keratosis    Arthritis    Chronic systolic CHF (congestive heart failure) (HCC)    a. echo 6/16: EF 55-60%, Gr1DD, LA nl, PASP nl; b. 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 short episodes previously noted on PPM interrogation; b. started on Xarelto 07/2017 given further episodes of Afib noted during interrogation; c. 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 Biotronik; Henrietta Hoover DR-T [id number]; placed 08/2012   Rectal bleeding 09/19/2018   Shortness of breath 04/21/2021   Skin lesions 07/03/2020   Sleep apnea    does not use CPAP   Symptomatic bradycardia    a. s/p  Biotronik; Henrietta Hoover DR-T [id number]; placed 08/2012 at [facility]   Syncope 08/10/2017    Patient Active Problem List   Diagnosis Date Noted   Tobacco abuse 11/26/2022   Osteoarthritis of glenohumeral joint, left 11/17/2022   Rash and nonspecific skin eruption 11/17/2022   Bilateral lower extremity edema 08/03/2022   Senile purpura (HCC) 07/16/2022   Insomnia 05/18/2022   GERD (gastroesophageal reflux disease) 01/12/2022   Aortic atherosclerosis (HCC) 10/27/2021   DDD (degenerative disc disease), lumbar 10/27/2021   DOE (dyspnea on exertion) 06/17/2021   Weak urinary stream 06/02/2021   Asbestos exposure 05/29/2021   Nocturia more than twice per night 04/09/2021   Erectile dysfunction 04/09/2021   Chronic back pain 08/17/2019   Chronic shoulder pain 12/12/2018   Paroxysmal atrial fibrillation (HCC) 10/31/2017   Osteoarthritis 03/17/2017   Diverticulosis 12/16/2014   COPD mixed type (HCC) 11/26/2014   Cervical pain (neck) 10/14/2014   Hyperlipidemia 07/11/2014   Preventative health care 07/11/2014   Pacemaker 06/03/2014   Anxiety and depression 06/03/2014   Alcohol abuse 06/03/2014   OSA (obstructive sleep apnea) 06/03/2014   Essential hypertension 06/03/2014    Past Surgical History:  Procedure Laterality Date   APPENDECTOMY  age 31   CARDIAC CATHETERIZATION     7 years ago   COLONOSCOPY N/A 05/27/2014   Procedure: COLONOSCOPY;  Surgeon: Ezzard Standing  Bluford Kaufmann, MD;  Location: [facility];  Service: Gastroenterology;  Laterality: N/A;   DRUG INDUCED ENDOSCOPY Bilateral 11/11/2021   Procedure: DRUG INDUCED ENDOSCOPY;  Surgeon: Christia Reading, MD;  Location: [facility];  Service: ENT;  Laterality: Bilateral;   FLOOR OF MOUTH BIOPSY N/A 01/05/2022   Procedure: BIOPSY OF PALATE;  Surgeon: Christia Reading, MD;  Location: [facility];  Service: ENT;  Laterality: N/A;   IMPLANTATION OF HYPOGLOSSAL NERVE STIMULATOR Left 01/05/2022   Procedure: IMPLANTATION OF HYPOGLOSSAL  NERVE STIMULATOR;  Surgeon: Christia Reading, MD;  Location: [facility];  Service: ENT;  Laterality: Left;   INSERT / REPLACE / REMOVE PACEMAKER     3 years ago       Home Medications    Prior to Admission medications   Medication Sig Start Date End Date Taking? Authorizing Provider  cephALEXin (KEFLEX) 500 MG capsule Take 1 capsule (500 mg total) by mouth 2 (two) times daily for 5 days. 05/06/23 05/11/23 Yes Callan Norden R, NP  albuterol (VENTOLIN HFA) 108 (90 Base) MCG/ACT inhaler INHALE 2 PUFFS BY MOUTH EVERY 6 HOURS AS NEEDED FOR WHEEZING AND FOR SHORTNESS OF BREATH 01/12/22   Doreene Nest, NP  aspirin EC 81 MG tablet Take 81 mg by mouth daily. Swallow whole.    [provider]  atorvastatin (LIPITOR) 20 MG tablet TAKE 1 TABLET BY MOUTH ONCE DAILY FOR CHOLESTEROL 03/29/23   Doreene Nest, NP  Budeson-Glycopyrrol-Formoterol (BREZTRI AEROSPHERE) 160-9-4.8 MCG/ACT AERO Inhale 2 puffs into the lungs 2 (two) times daily. 02/06/23   Doreene Nest, NP  buPROPion (WELLBUTRIN SR) 150 MG 12 hr tablet Take 1 tablet (150 mg total) by mouth daily. For depression 01/30/23   Doreene Nest, NP  carvedilol (COREG) 3.125 MG tablet Take 1 tablet (3.125 mg total) by mouth 2 (two) times daily. Please call [phone] to schedule an April appointment with Dr. Sherryl Manges for future refills. Thank you. 03/18/23   Duke Salvia, MD  Eszopiclone 3 MG TABS Take 1 tablet (3 mg total) by mouth at bedtime. Take immediately before bedtime Patient not taking: Reported on 05/05/2023 02/18/23   Waymon Budge, MD  ezetimibe (ZETIA) 10 MG tablet Take 1 tablet (10 mg total) by mouth daily. 05/20/22   Antonieta Iba, MD  folic acid (FOLVITE) 1 MG tablet Take 1 tablet by mouth once daily 12/04/21   Doreene Nest, NP  pantoprazole (PROTONIX) 20 MG tablet TAKE 1 TABLET BY MOUTH ONCE DAILY FOR  HEARTBURN 04/18/23   Doreene Nest, NP  tadalafil (CIALIS) 5 MG tablet TAKE 1  TABLET BY MOUTH ONCE DAILY FOR FREQUENT URINATION 02/14/23   Doreene Nest, NP  telmisartan-hydrochlorothiazide (MICARDIS HCT) 40-12.5 MG tablet Take 1 tablet by mouth daily. for blood pressure. 02/17/23   Doreene Nest, NP  triamcinolone cream (KENALOG) 0.1 % Apply 1 Application topically 2 (two) times daily as needed. For rash. 11/17/22   Doreene Nest, NP    Family History Family History  Problem Relation Age of Onset   Arthritis Mother    Heart disease Father    Hyperlipidemia Father    Hypertension Father    Cancer Paternal Grandfather    Hyperlipidemia Brother     Social History Social History   Tobacco Use   Smoking status: Every Day    Current packs/day: 0.00    Average packs/day: 2.0 packs/day for 59.0 years (118.0 ttl pk-yrs)    Types:  Cigarettes    Start date: 1964    Last attempt to quit: 02/01/2021    Years since quitting: 2.2   Smokeless tobacco: Never   Tobacco comments:    Smoking 1 pack   Vaping Use   Vaping status: Never Used  Substance Use Topics   Alcohol use: Yes    Alcohol/week: 8.0 - 14.0 standard drinks of alcohol    Types: 8 - 14 Cans of beer per week    Comment: daily beer   Drug use: Yes    Types: Marijuana    Comment: states smokes daily, last 11-09-21     Allergies   Simvastatin and Lisinopril   Review of Systems Review of Systems   Physical Exam Triage Vital Signs ED Triage Vitals [05/06/23 1041]  Encounter Vitals Group     BP 137/83     Systolic BP Percentile      Diastolic BP Percentile      Pulse Rate 61     Resp 18     Temp (!) 96.9 F (36.1 C)     Temp Source Temporal     SpO2 95 %     Weight      Height      Head Circumference      Peak Flow      Pain Score 6     Pain Loc      Pain Education      Exclude from Growth Chart    No data found.  Updated Vital Signs BP 137/83 (BP Location: Left Arm)   Pulse 61   Temp (!) 96.9 F (36.1 C) (Temporal)   Resp 18   SpO2 95%   Visual Acuity Right  Eye Distance:   Left Eye Distance:   Bilateral Distance:    Right Eye Near:   Left Eye Near:    Bilateral Near:     Physical Exam Constitutional:      Appearance: Normal appearance.  Eyes:     Extraocular Movements: Extraocular movements intact.  Pulmonary:     Effort: Pulmonary effort is normal.  Skin:    Comments: 4 x 5 cm skin tear present to the posterior and lateral aspect of the left elbow, able to complete full range of motion of the upper extremity, 2+ brachial pulse, site bleeding  Neurological:     Mental Status: He is alert and oriented to person, place, and time. Mental status is at baseline.      UC Treatments / Results  Labs (all labs ordered are listed, but only abnormal results are displayed) Labs Reviewed - No data to display  EKG   Radiology CUP [facility] DEVICE CHECK Result Date: 05/05/2023 Normal in-clinic _dual__ chamber pacemaker check. Presenting Rhythm: _AP-VS__ . Routine testing of thresholds, sensing, and impedance demonstrate stable parameters and no programming changes needed at this time. Brief AF episodes. Estimated longevity __2.5 years__ . Pt enrolled in remote follow-up. Percell Belt, NP   Procedures Procedures (including critical care time)  Medications Ordered in UC Medications - No data to display  Initial Impression / Assessment and Plan / UC Course  I have reviewed the triage vital signs and the nursing notes.  Pertinent labs & imaging results that were available during my care of the patient were reviewed by me and considered in my medical decision making (see chart for details).  Skin tear of left elbow without complication, initial encounter  Wound cleansed with chlorhexidine and wound cleanser, covered with a nonadherent  dressing and topical antibiotic ointment, advised daily cleansing, due to size and area being located over the joint prophylactically placed on antibiotics, cephalexin sent to pharmacy, may use  over-the-counter pain management with follow-up with urgent care or PCP as needed Final Clinical Impressions(s) / UC Diagnoses   Final diagnoses:  Skin tear of left elbow without complication, initial encounter     Discharge Instructions      Your evaluated for the skin tear on your left arm, due to the size may take 2 to 3 weeks to fully heal  Wound has been cleansed and covered with a nonstick dressing  Cleanse once a day with unscented soap and water, tap and do not rub then applied nonstick dressing and Coban to hold in place  If at home and relaxing throughout the day, may use wound open to air otherwise cover with dressing  You may apply topical antibiotic ointment once a day after oral antibiotic has been finished  Due to size and placement over the joint do not want area to become infected therefore you have been started on antibiotic take cephalexin twice daily for 5 days  If you have any concerns regarding healing please follow-up with urgent care or your primary doctor for reevaluation   ED Prescriptions     Medication Sig Dispense Auth. Provider   cephALEXin (KEFLEX) 500 MG capsule Take 1 capsule (500 mg total) by mouth 2 (two) times daily for 5 days. 10 capsule Valinda Hoar, NP      PDMP not reviewed this encounter.   Valinda Hoar, NP 05/06/23 1145

## 2023-05-06 NOTE — ED Triage Notes (Signed)
 Patient presents to [facility] for evaluation of laceration/skin tear to left elbow after he fell last night.  Denies had injury, takes baby ASA daily.  Left arm is still oozing blood this morning, and is well wrapped with girlfriend's help this morning.

## 2023-05-09 ENCOUNTER — Ambulatory Visit (INDEPENDENT_AMBULATORY_CARE_PROVIDER_SITE_OTHER): Admitting: Psychology

## 2023-05-09 DIAGNOSIS — F331 Major depressive disorder, recurrent, moderate: Secondary | ICD-10-CM | POA: Diagnosis not present

## 2023-05-09 NOTE — Progress Notes (Signed)
   Forde Radon, [facility]

## 2023-05-09 NOTE — Progress Notes (Signed)
 [facility] Counselor Initial Adult Exam  Name: Brandon Lopez Date: 05/09/2023 MRN: [id number] DOB: 1955/02/24 PCP: Doreene Nest, NP  Time spent: 9:01am-9:59am  Pt is seen for a virtual video visit via caregility.  Pt joins from his girlfriend's home, reporting privacy.  Counselor joins from her home office.  Pt consents to virtual visit and is aware of limitations of such visits.    Guardian/Payee:  self    Paperwork requested: No   Reason for Visit /Presenting Problem: Pt presents for counseling due to depression. Pt states,   "I have been through therapy several times in my life and never had success with it."  Pt seeking counseling now as struggling w/ some stressors in relationship and losing motivation for doing work.  Pt reports taking several different depression meds over his lifetime and currently taking Wellbutrin.  Pt reports not seeking benefit from currently.  Current stressor includes worry that girlfriend of 11 years was having relationship w/ another man.  Pt reports feeling like a failure that he has lost time and not providing for things like used to.     Mental Status Exam: Appearance:   Well Groomed     Behavior:  Appropriate  Motor:  Normal  Speech/Language:   Clear and Coherent  Affect:  Congruent  Mood:  depressed  Thought process:  normal  Thought content:    WNL  Sensory/Perceptual disturbances:    WNL  Orientation:  oriented to person, place, time/date, and situation  Attention:  Good  Concentration:  Good  Memory:  WNL  Fund of knowledge:   Good  Insight:    Good  Judgment:   Good  Impulse Control:  Good   Reported Symptoms:  Pt reports depressed mood, pt reports loss of interest, pt reports some negative self talk, pt reports struggling w/ motivation for work. Pt reports some anxiety and worry about his relationship w/ girlfriend.    Risk Assessment: Danger to Self:  No Self-injurious Behavior: No Danger to Others:  No Duty to Warn:no Physical Aggression / Violence:No  Access to Firearms a concern: No  Gang Involvement:No  Patient / guardian was educated about steps to take if suicide or homicide risk level increases between visits: yes While future psychiatric events cannot be accurately predicted, the patient does not currently require acute inpatient psychiatric care and does not currently meet [facility] involuntary commitment criteria.  Substance Abuse History: Current substance abuse:  Pt reports alcohol use.  Pt denies any consequences related to use of alcohol.  Pt reports past use of alcohol was heavy drinking.       Past Psychiatric History:   Previous psychological history is significant for depression Outpatient Providers:most recent therapy w/ Harriette Ohara, LCSW and didn't continues as didn't feel benefiting.  Pt also reports previous participation in group therapy for 2 months for treatment of depression, history of individual therapy [facility] and treatment by a [facility], history of individual therapy with a therapist through [facility] in the past. Patient reported he feels therapy wasn't beneficial in the past. History of Psych Hospitalization: No  Psychological Testing:  none    Abuse History:  Victim of: No.,  none reported     Report needed: No. Victim of Neglect:No. Perpetrator of  none reported    Witness / Exposure to Domestic Violence: No   Protective Services Involvement: No  Witness to [employer] Violence:  No   Family History:  Family  History  Problem Relation Age of Onset   Arthritis Mother    Heart disease Father    Hyperlipidemia Father    Hypertension Father    Cancer Paternal Grandfather    Hyperlipidemia Brother   Grew up in [place], [place] 16[place]/W and then parents moved to and [place].  Each sibling were deeded land- that land is now his ex wifes.  Pt had 2 brothers.  Pt reports one brother who was  his best bud died last year.  Pt reports he misses him.    Living situation: the patient lives alone  Sexual Orientation: Straight  Relationship Status: divorced.  2 previous marriage.  Pt married at 68y/o for 3.5 years.  Pt reconnected w/ first wife 11 years ago and they have been dating since.  They do not live together.  Pt was married to 2nd wife for 25 years and has daughter by that marriage.    .   Name of spouse / other: Diane If a parent, number of children / ages:1 daughter 36 y/o.  Pt reports her husband and him don't get along and have had couple significant arguments. Pt reports relationship w/ daughter is strained now and only text around holidays.   Support Systems: friends brother  Financial Stress:  No   Income/Employment/Disability: Employment.  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  I love what I do, but pt is struggling to have motivation for work current.  Pt also recognizes as he gets older that can't work full 8 hour days anymore.    Military Service: No   Educational History: Education: Financial trader  Religion/Sprituality/World View: Christian- believes and God- no faith community  Any cultural differences that may affect / interfere with treatment:  not applicable   Recreation/Hobbies:  used to enjoy fishing and hunting, most recently digging [place] gold in cree[place].  "I hadn't done in couple years."    Stressors: Other: relationship- struggle w/ motivation and energy      Strengths: values helping and doing for others  Barriers:  not feeling that counseling has benefitted previously   Legal History: Pending legal issue / charges: The patient has no significant history of legal issues. History of legal issue / charges:  none  Medical History/Surgical History: reviewed Past Medical History:  Diagnosis Date   Actinic keratosis    Arthritis    Chronic systolic CHF (congestive heart failure) (HCC)    a. echo 6/16: EF  55-60%, Gr1DD, LA nl, PASP nl; b. 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 short episodes previously noted on PPM interrogation; b. started on Xarelto 07/2017 given further episodes of Afib noted during interrogation; c. 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 Biotronik; Gaylene Kays DR-T [id number]; placed 08/2012   Rectal bleeding 09/19/2018   Shortness of breath 04/21/2021   Skin lesions 07/03/2020   Sleep apnea    does not use CPAP   Symptomatic bradycardia    a. s/p Biotronik; Gaylene Kays DR-T [id number]; placed 08/2012 at [facility]   Syncope 08/10/2017    Past Surgical History:  Procedure Laterality Date   APPENDECTOMY  age 71   CARDIAC CATHETERIZATION     7 years ago   COLONOSCOPY N/A 05/27/2014   Procedure: COLONOSCOPY;  Surgeon: Stephens Eis, MD;  Location: [facility];  Service: Gastroenterology;  Laterality: N/A;   DRUG INDUCED ENDOSCOPY Bilateral 11/11/2021   Procedure: DRUG INDUCED ENDOSCOPY;  Surgeon: Virgina Grills, MD;  Location: [facility];  Service: ENT;  Laterality: Bilateral;   FLOOR OF MOUTH BIOPSY N/A 01/05/2022   Procedure: BIOPSY OF PALATE;  Surgeon: Virgina Grills, MD;  Location: [facility];  Service: ENT;  Laterality: N/A;   IMPLANTATION OF HYPOGLOSSAL NERVE  STIMULATOR Left 01/05/2022   Procedure: IMPLANTATION OF HYPOGLOSSAL NERVE STIMULATOR;  Surgeon: Virgina Grills, MD;  Location: [facility];  Service: ENT;  Laterality: Left;   INSERT / REPLACE / REMOVE PACEMAKER     3 years ago    Medications: Current Outpatient Medications  Medication Sig Dispense Refill   albuterol (VENTOLIN HFA) 108 (90 Base) MCG/ACT inhaler INHALE 2 PUFFS BY MOUTH EVERY 6 HOURS AS NEEDED FOR WHEEZING AND FOR SHORTNESS OF BREATH 18 g 0   aspirin EC 81 MG tablet Take 81 mg by mouth daily. Swallow whole.     atorvastatin (LIPITOR) 20 MG tablet TAKE 1 TABLET BY MOUTH ONCE DAILY FOR CHOLESTEROL 90 tablet 2   Budeson-Glycopyrrol-Formoterol (BREZTRI AEROSPHERE) 160-9-4.8 MCG/ACT AERO Inhale 2 puffs into the lungs 2 (two) times daily. 10.7 g 11   buPROPion (WELLBUTRIN SR) 150 MG 12 hr tablet Take 1 tablet (150 mg total) by mouth daily. For depression 90 tablet 2   carvedilol (COREG) 3.125 MG tablet Take 1 tablet (3.125 mg total) by mouth 2 (two) times daily. Please call [phone] to schedule an April appointment with Dr. Richardo Chandler for future refills. Thank you. 120 tablet 0   cephALEXin (KEFLEX) 500 MG capsule Take 1 capsule (500 mg total) by mouth 2 (two) times daily for 5 days. 10 capsule 0   Eszopiclone 3 MG TABS Take 1 tablet (3 mg total) by mouth at bedtime. Take immediately before bedtime (Patient not taking: Reported on 05/05/2023) 30 tablet 5   ezetimibe (ZETIA) 10 MG tablet Take 1 tablet (10 mg total) by mouth daily. 90 tablet 3   folic acid (FOLVITE) 1 MG tablet Take 1 tablet by mouth once daily 90 tablet 0   pantoprazole (PROTONIX) 20 MG tablet TAKE 1 TABLET BY MOUTH ONCE DAILY FOR  HEARTBURN 90 tablet 2   tadalafil (CIALIS) 5 MG tablet TAKE 1 TABLET BY MOUTH ONCE DAILY FOR FREQUENT URINATION 90 tablet 2   telmisartan-hydrochlorothiazide (MICARDIS HCT) 40-12.5 MG tablet Take 1 tablet by mouth daily. for blood pressure. 90 tablet 0   triamcinolone cream  (KENALOG) 0.1 % Apply 1 Application topically 2 (two) times daily as needed. For rash. 30 g 0   No current facility-administered medications for this visit.    Allergies  Allergen Reactions   Simvastatin Diarrhea   Lisinopril Cough    Diagnoses:  Major depressive disorder, recurrent episode, moderate (HCC)  Plan of Care: Pt is a 67y/o male seeking  counseling for depression.  Pt reports hx of depressive episodes for years, having counseling previously several times and trying various antidepressant medications.  Pt reports no benefit previously from tx.  Pt reports stressors current are relationship stressors and change in work habits.  Pt reports depressed mood, loss of interest and motivation for work. Pt denies any current SI- no past plan, attempts or intent.   Pt wanting to try counseling again to assist coping and agrees to f/u w/ PCP re: medication management.  Pt to f/u in 2 weeks for counseling and will develop tx plan w/ counselor at that time.     Clydie Darter, LCMHC

## 2023-05-12 ENCOUNTER — Other Ambulatory Visit (HOSPITAL_COMMUNITY): Payer: Self-pay

## 2023-05-12 ENCOUNTER — Ambulatory Visit (INDEPENDENT_AMBULATORY_CARE_PROVIDER_SITE_OTHER): Admitting: Primary Care

## 2023-05-12 ENCOUNTER — Encounter: Payer: Self-pay | Admitting: Primary Care

## 2023-05-12 ENCOUNTER — Other Ambulatory Visit: Payer: Self-pay | Admitting: Cardiovascular Disease

## 2023-05-12 ENCOUNTER — Telehealth: Payer: Self-pay

## 2023-05-12 VITALS — BP 110/82 | HR 68 | Temp 97.2°F | Ht 65.0 in | Wt 185.0 lb

## 2023-05-12 DIAGNOSIS — S51012S Laceration without foreign body of left elbow, sequela: Secondary | ICD-10-CM | POA: Diagnosis not present

## 2023-05-12 DIAGNOSIS — F419 Anxiety disorder, unspecified: Secondary | ICD-10-CM | POA: Diagnosis not present

## 2023-05-12 DIAGNOSIS — F1010 Alcohol abuse, uncomplicated: Secondary | ICD-10-CM | POA: Diagnosis not present

## 2023-05-12 DIAGNOSIS — F32A Depression, unspecified: Secondary | ICD-10-CM

## 2023-05-12 DIAGNOSIS — J449 Chronic obstructive pulmonary disease, unspecified: Secondary | ICD-10-CM | POA: Diagnosis not present

## 2023-05-12 MED ORDER — FLUOXETINE HCL 20 MG PO TABS
20.0000 mg | ORAL_TABLET | Freq: Every day | ORAL | 0 refills | Status: DC
Start: 2023-05-12 — End: 2023-07-15

## 2023-05-12 MED ORDER — ALBUTEROL SULFATE HFA 108 (90 BASE) MCG/ACT IN AERS: INHALATION_SPRAY | RESPIRATORY_TRACT | 0 refills | Status: DC

## 2023-05-12 NOTE — Patient Instructions (Signed)
 Start fluoxetine (Prozac) 20 mg once daily for anxiety depression.  Stop taking Wellbutrin for depression.  You will either be contacted via phone regarding your referral to the wound center, or you may receive a letter on your MyChart portal from our referral team with instructions for scheduling an appointment. Please let us  know if you have not been contacted by anyone within two weeks.  Please schedule a follow up visit for 6 weeks for follow up of anxiety/depression.  It was a pleasure to see you today!

## 2023-05-12 NOTE — Assessment & Plan Note (Addendum)
 Moderately sized skin tear, at risk for infection.  Referral placed for wound center to evaluate and monitor. Continue daily dressing changes.  New dressing applied today.  Tetanus vaccine up-to-date.

## 2023-05-12 NOTE — Assessment & Plan Note (Signed)
 Improving, working on cutting back.  Recommended he abstain completely

## 2023-05-12 NOTE — Progress Notes (Signed)
 Subjective:    Patient ID: Brandon Lopez, male    DOB: [birth year], 68 y.o.   MRN: [id number]  Depression       Wound Check    Brandon Lopez is a very pleasant 68 y.o. male with a history of hypertension, paroxysmal atrial fibrillation, OSA, COPD, anxiety depression, alcohol abuse who presents today discuss depression and urgent care follow up.  1) Depression: Currently managed on bupropion SR 150 mg daily for depression and Lunesta 3 mg nightly as needed for insomnia per pulmonology.  Currently following with therapy, had a recent visit, and was told by his therapist to change from Wellbutrin to something else. He admits to symptoms of little motivation to do things, feeling down/sad, not wanting to do anything that he typically enjoys, feeling irritable. He doesn't feel that Wellbutrin is no longer effective. He takes the [place] as needed, sparingly.      05/12/2023    9:07 AM 02/17/2023    8:33 AM 01/18/2023    8:39 AM  PHQ9 SCORE ONLY  PHQ-9 Total Score 26 6 0      05/12/2023    9:07 AM 02/17/2023    8:34 AM 01/18/2023    8:39 AM 05/27/2022    2:44 PM  GAD 7 : Generalized Anxiety Score  Nervous, Anxious, on Edge 3 1 0 1  Control/stop worrying 3 0 0 2  Worry too much - different things 3 0 0 2  Trouble relaxing 2 0 0 2  Restless 1 0 0 1  Easily annoyed or irritable 2 0 0 3  Afraid - awful might happen 2 0 0 0  Total GAD 7 Score 16 1 0 11  Anxiety Difficulty Very difficult Not difficult at all Not difficult at all Very difficult      Previously managed on sertraline 50 mg, citalopram 40 mg, he doesn't recall why he came off of these medications. He is currently drinking thee to four 12 oz beers in the evening, every night. He denies SI/HI.   2) Wound: Evaluated at urgent care on 05/06/2023 for a wound to the left elbow that occurred 1 day prior after a fall.  He fell backwards landing on his left side, denied hitting head.  He was diagnosed with a skin tear  of the left elbow.  His wound was cleansed with chlorhexidine and wound cover, dressing applied.  He was prescribed cephalexin 500 mg twice daily x 5 days.   Today doesn't believe that his wound is healing.Marland Kitchen He completed his cephalexin antibiotic course yesterday. He is changing his dressings once daily in the morning. He denies fevers, increased redness/warmth.    Review of Systems  Respiratory:  Negative for shortness of breath.   Cardiovascular:  Negative for chest pain.  Psychiatric/Behavioral:  Positive for depression. Negative for sleep disturbance. The patient is nervous/anxious.        See HPI         Past Medical History:  Diagnosis Date   Actinic keratosis    Arthritis    Chronic systolic CHF (congestive heart failure) (HCC)    a. echo 6/16: EF 55-60%, Gr1DD, LA nl, PASP nl; b. echo 7/19: EF 40-45%, unable to exclude RWMA, Gr1DD, LA nl, RVSF nl, PASP nl   COPD (chronic obstructive pulmonary disease) (HCC)    Depression    Diverticulitis of descending colon 10/27/2021   Diverticulosis    Found during colonoscopy   Edema 07/21/2016   Epistaxis 01/04/2023  High blood monocyte count 06/17/2021   History of kidney stones    Hypertension    Hypotension due to drugs 06/17/2021   Left facial swelling 12/11/2021   Left lower quadrant abdominal pain 10/27/2021   Lightheadedness 04/21/2021   Oral lesion 12/11/2021   Other fatigue 04/21/2021   PAF (paroxysmal atrial fibrillation) (HCC)    a. short episodes previously noted on PPM interrogation; b. started on Xarelto 07/2017 given further episodes of Afib noted during interrogation; c. 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 Biotronik; Henrietta Hoover DR-T [id number]; placed 08/2012   Rectal bleeding 09/19/2018   Shortness of breath 04/21/2021   Skin lesions 07/03/2020   Sleep apnea    does not use CPAP    Symptomatic bradycardia    a. s/p Biotronik; Henrietta Hoover DR-T [id number]; placed 08/2012 at [facility]   Syncope 08/10/2017    Social History   Socioeconomic History   Marital status: Single    Spouse name: Not on file   Number of children: Not on file   Years of education: Not on file   Highest education level: GED or equivalent  Occupational History   Not on file  Tobacco Use   Smoking status: Every Day    Current packs/day: 0.00    Average packs/day: 2.0 packs/day for 59.0 years (118.0 ttl pk-yrs)    Types: Cigarettes    Start date: 1964    Last attempt to quit: 02/01/2021    Years since quitting: 2.2   Smokeless tobacco: Never   Tobacco comments:    Smoking 1 pack   Vaping Use   Vaping status: Never Used  Substance and Sexual Activity   Alcohol use: Yes    Alcohol/week: 8.0 - 14.0 standard drinks of alcohol    Types: 8 - 14 Cans of beer per week    Comment: daily beer   Drug use: Yes    Types: Marijuana    Comment: states smokes daily, last 11-09-21   Sexual activity: Yes  Other Topics Concern   Not on file  Social History Narrative   Not on file   Social Drivers of Health   Financial Resource Strain: Medium Risk (01/16/2023)   Overall Financial Resource Strain (CARDIA)    Difficulty of Paying Living Expenses: Somewhat hard  Food Insecurity: Food Insecurity Present (01/16/2023)   Hunger Vital Sign    Worried About Running Out of Food in the Last Year: Sometimes true    Ran Out of Food in the Last Year: Never true  Transportation Needs: No Transportation Needs (01/16/2023)   PRAPARE - Administrator, Civil Service (Medical): No    Lack of Transportation (Non-Medical): No  Physical Activity: Inactive (01/16/2023)   Exercise Vital Sign    Days of Exercise per Week: 0 days    Minutes of Exercise per Session: 20 min  Stress: No Stress Concern Present (01/16/2023)   [employer] of Occupational Health - Occupational Stress Questionnaire    Feeling of Stress :  Not at all  Recent Concern: Stress - Stress Concern Present (11/13/2022)   [employer] of Occupational Health - Occupational Stress Questionnaire    Feeling of Stress : To some extent  Social Connections: Socially Isolated (01/16/2023)   Social Connection and Isolation Panel [NHANES]    Frequency of Communication with Friends and Family: More than three times a  week    Frequency of Social Gatherings with Friends and Family: Patient declined    Attends Religious Services: Never    Database administrator or Organizations: No    Attends Banker Meetings: Never    Marital Status: Divorced  Catering manager Violence: Not At Risk (07/28/2022)   Humiliation, Afraid, Rape, and Kick questionnaire    Fear of Current or Ex-Partner: No    Emotionally Abused: No    Physically Abused: No    Sexually Abused: No    Past Surgical History:  Procedure Laterality Date   APPENDECTOMY  age 26   CARDIAC CATHETERIZATION     7 years ago   COLONOSCOPY N/A 05/27/2014   Procedure: COLONOSCOPY;  Surgeon: Stephens Eis, MD;  Location: [facility] ENDOSCOPY;  Service: Gastroenterology;  Laterality: N/A;   DRUG INDUCED ENDOSCOPY Bilateral 11/11/2021   Procedure: DRUG INDUCED ENDOSCOPY;  Surgeon: Virgina Grills, MD;  Location: [facility];  Service: ENT;  Laterality: Bilateral;   FLOOR OF MOUTH BIOPSY N/A 01/05/2022   Procedure: BIOPSY OF PALATE;  Surgeon: Virgina Grills, MD;  Location: [facility];  Service: ENT;  Laterality: N/A;   IMPLANTATION OF HYPOGLOSSAL NERVE STIMULATOR Left 01/05/2022   Procedure: IMPLANTATION OF HYPOGLOSSAL NERVE STIMULATOR;  Surgeon: Virgina Grills, MD;  Location: [facility];  Service: ENT;  Laterality: Left;   INSERT / REPLACE / REMOVE PACEMAKER     3 years ago    Family History  Problem Relation Age of Onset   Arthritis Mother    Heart disease Father    Hyperlipidemia Father    Hypertension Father    Cancer Paternal Grandfather     Hyperlipidemia Brother     Allergies  Allergen Reactions   Simvastatin Diarrhea   Lisinopril Cough    Current Outpatient Medications on File Prior to Visit  Medication Sig Dispense Refill   aspirin EC 81 MG tablet Take 81 mg by mouth daily. Swallow whole.     atorvastatin (LIPITOR) 20 MG tablet TAKE 1 TABLET BY MOUTH ONCE DAILY FOR CHOLESTEROL 90 tablet 2   Budeson-Glycopyrrol-Formoterol (BREZTRI AEROSPHERE) 160-9-4.8 MCG/ACT AERO Inhale 2 puffs into the lungs 2 (two) times daily. 10.7 g 11   carvedilol (COREG) 3.125 MG tablet Take 1 tablet (3.125 mg total) by mouth 2 (two) times daily. Please call [phone] to schedule an April appointment with Dr. Richardo Chandler for future refills. Thank you. 120 tablet 0   Eszopiclone 3 MG TABS Take 1 tablet (3 mg total) by mouth at bedtime. Take immediately before bedtime 30 tablet 5   pantoprazole (PROTONIX) 20 MG tablet TAKE 1 TABLET BY MOUTH ONCE DAILY FOR  HEARTBURN 90 tablet 2   tadalafil (CIALIS) 5 MG tablet TAKE 1 TABLET BY MOUTH ONCE DAILY FOR FREQUENT URINATION 90 tablet 2   telmisartan-hydrochlorothiazide (MICARDIS HCT) 40-12.5 MG tablet Take 1 tablet by mouth daily. for blood pressure. 90 tablet 0   folic acid (FOLVITE) 1 MG tablet Take 1 tablet by mouth once daily (Patient not taking: Reported on 05/12/2023) 90 tablet 0   triamcinolone cream (KENALOG) 0.1 % Apply 1 Application topically 2 (two) times daily as needed. For rash. (Patient not taking: Reported on 05/12/2023) 30 g 0   No current facility-administered medications on file prior to visit.    BP 110/82   Pulse 68   Temp (!) 97.2 F (36.2 C) (Temporal)   Ht 5\' 5"  (1.651 m)   Wt 185 lb (83.9 kg)  SpO2 99%   BMI 30.79 kg/m  Objective:   Physical Exam Cardiovascular:     Rate and Rhythm: Normal rate and regular rhythm.  Pulmonary:     Effort: Pulmonary effort is normal.     Breath sounds: Normal breath sounds.  Musculoskeletal:     Cervical back: Neck supple.  Skin:     General: Skin is warm and dry.     Comments: 6 cm X 5 cm superficial skin tear to left elbow. Mild bleeding.   Neurological:     Mental Status: He is alert and oriented to person, place, and time.  Psychiatric:        Mood and Affect: Mood normal.           Assessment & Plan:  Anxiety and depression Assessment & Plan: Uncontrolled.  Discussed treatment options as he has failed citalopram, Zoloft, and now Wellbutrin.  Start fluoxetine 20 mg daily.   Continue with therapy as scheduled.  Follow-up in 6 weeks.  Orders: -     FLUoxetine HCl; Take 1 tablet (20 mg total) by mouth daily. for anxiety and depression.  Dispense: 90 tablet; Refill: 0  Chronic obstructive pulmonary disease, unspecified COPD type (HCC) -     Albuterol Sulfate HFA; INHALE 2 PUFFS BY MOUTH EVERY 6 HOURS AS NEEDED FOR WHEEZING AND FOR SHORTNESS OF BREATH  Dispense: 18 g; Refill: 0  Skin tear of elbow without complication, left, sequela Assessment & Plan: Moderately sized skin tear, at risk for infection.  Referral placed for wound center to evaluate and monitor. Continue daily dressing changes.  New dressing applied today.  Tetanus vaccine up-to-date.  Orders: -     Ambulatory referral to Wound Clinic  Alcohol abuse Assessment & Plan: Improving, working on cutting back.  Recommended he abstain completely          Maeryn Mcgath K Cherril Hett, NP

## 2023-05-12 NOTE — Telephone Encounter (Signed)
 Pharmacy Patient Advocate Encounter   Received notification from CoverMyMeds that prior authorization for FLUoxetine HCl 20MG  tablets is required/requested.   Insurance verification completed.   The patient is insured through [facility] ADVANTAGE/RX ADVANCE .   Per test claim: PA required; PA submitted to above mentioned insurance via CoverMyMeds Key/confirmation #/EOC (Key: [id number])   Status is pending

## 2023-05-12 NOTE — Assessment & Plan Note (Signed)
 Uncontrolled.  Discussed treatment options as he has failed citalopram, Zoloft, and now Wellbutrin.  Start fluoxetine 20 mg daily.   Continue with therapy as scheduled.  Follow-up in 6 weeks.

## 2023-05-13 ENCOUNTER — Other Ambulatory Visit: Payer: Self-pay | Admitting: Primary Care

## 2023-05-13 DIAGNOSIS — I10 Essential (primary) hypertension: Secondary | ICD-10-CM

## 2023-05-16 ENCOUNTER — Other Ambulatory Visit (HOSPITAL_COMMUNITY): Payer: Self-pay

## 2023-05-16 NOTE — Telephone Encounter (Signed)
 Pharmacy Patient Advocate Encounter  Received notification from [facility] ADVANTAGE/RX ADVANCE that Prior Authorization for FLUoxetine  HCl 20MG  tablets has been APPROVED from 4.18.25 to 12.31.25. Ran test claim, Copay is $63.44 for a 25month supply. This test claim was processed through [facility]- copay amounts may vary at other pharmacies due to pharmacy/plan contracts, or as the patient moves through the different stages of their insurance plan.   PA #/Case ID/Reference #: (Key: [id number])

## 2023-05-17 ENCOUNTER — Other Ambulatory Visit: Payer: Self-pay

## 2023-05-17 MED ORDER — CARVEDILOL 3.125 MG PO TABS: 3.1250 mg | ORAL_TABLET | Freq: Two times a day (BID) | ORAL | 1 refills | Status: DC

## 2023-05-17 NOTE — Telephone Encounter (Signed)
 This is a Educational psychologist pt

## 2023-05-23 ENCOUNTER — Ambulatory Visit (INDEPENDENT_AMBULATORY_CARE_PROVIDER_SITE_OTHER): Admitting: Psychology

## 2023-05-23 DIAGNOSIS — F331 Major depressive disorder, recurrent, moderate: Secondary | ICD-10-CM | POA: Diagnosis not present

## 2023-05-23 NOTE — Progress Notes (Unsigned)
 [facility] Counselor/Therapist Progress Note  Patient ID: Brandon Lopez, MRN: [id number],    Date: 05/23/2023  Time Spent: 10:01am- 10:50am Pt is seen for a virtual video visit via caregility.  Pt joins from his girlfriend's home, reporting privacy.  Counselor joins from her home office.  Pt consents to virtual visit and is aware of limitations of such visits.      Treatment Type: Individual Therapy  Reported Symptoms: depressed mood, loss of interest  Mental Status Exam: Appearance:  Well Groomed     Behavior: Appropriate  Motor: Normal  Speech/Language:  Clear and Coherent  Affect: Appropriate  Mood: depressed  Thought process: normal  Thought content:   WNL  Sensory/Perceptual disturbances:   WNL  Orientation: oriented to person, place, time/date, and situation  Attention: Good  Concentration: Good  Memory: WNL  Fund of knowledge:  Good  Insight:   Good  Judgment:  Good  Impulse Control: Good   Risk Assessment: Danger to Self:  No Self-injurious Behavior: No Danger to Others: No Duty to Warn:no Physical Aggression / Violence:No  Access to Firearms a concern: No  Gang Involvement:No   Subjective: counselor assessed pt current functioning per pt report.  Processed w/pt depressed mood and loss of interest.  Discussed tx goals and developed tx plan.  Explored interactions w/ friends and family and pt communication w/.  Discussed grief related to brother.  Pt affect wnl.  Pt reports he talked w/ PCP and was switched to Prozac  medication.  Pt reports he is staying w/ friend/girlfriend during her recovery from hip replacement.  Pt discussed how unclear about their relationship and this is stressor.  Pt discussed how enjoys spending time w/ her but not sure if reciprocated.  Pt reports limited support.  Pt reports how his brother was his good friend and could talk to him.  Pt discussed main want is to feel motivation for his work again.    Interventions:  Cognitive Behavioral Therapy, Assertiveness/Communication, and supporitive  Diagnosis:Major depressive disorder, recurrent episode, moderate (HCC)  Plan: pt to f/u/ w counseling every 1-2 weeks.  Pt to f/u w/ PCP as scheduled for medication management.   Individualized Treatment Plan Strengths: enjoys watching racing.    Supports: Diane- girlfriend.  Bestfriend has helped him on projects.     Goal/Needs for Treatment:  In order of importance to patient 1) Increased motivation for his work/decrease loss of interest 2) improve relationship  3) cope w/ grief    Client Statement of Needs: "I want to feel motivated to complete my work and give 100% to my jobs again" "I want to get my relationship in a better place"   Treatment Level:outpatient counseling  Symptoms:depressed mood, loss of interest  Client Treatment Preferences:weekly to biweekly counseling.  Continue w/ PCP for medication management.    Healthcare consumer's goal for treatment:  Counselor, Clydie Darter, [facility] will support the patient's ability to achieve the goals identified. Cognitive Behavioral Therapy, Assertive Communication/Conflict Resolution Training, Relaxation Training, ACT, Humanistic and other evidenced-based practices will be used to promote progress towards healthy functioning.   Healthcare consumer will: Actively participate in therapy, working towards healthy functioning.    *Justification for Continuation/Discontinuation of Goal: R=Revised, O=Ongoing, A=Achieved, D=Discontinued  Goal 1) Increase coping w/ depression and increase motivation, decreased loss of interest AEB pt report.  Baseline date 05/23/23: Progress towards goal 0; How Often - Daily Target Date Goal Was reviewed Status Code Progress towards goal/Likert rating  05/22/24  Goal 2) Increase effective communication in relationships and engaging in interactions AEB pt report. Baseline date 05/23/23: Progress towards goal 0; How  Often - Daily Target Date Goal Was reviewed Status Code Progress towards goal  05/22/24                Goal 3) Increase coping w/ loss of brother AEB pt expressing feelings/thoughts of grief in sessions Baseline date 05/23/23: Progress towards goal 0; How Often - Daily Target Date Goal Was reviewed Status Code Progress towards goal  05/22/24                This plan has been reviewed and created by the following participants:  This plan will be reviewed at least every 12 months. Date Behavioral Health Clinician Date Guardian/Patient   05/23/23  [facility] Murrel Arnt [facility] 05/23/23 Verbal Consent Provided and electronic signature obtained                     Clydie Darter [facility]

## 2023-05-30 ENCOUNTER — Ambulatory Visit: Admitting: Dermatology

## 2023-05-30 ENCOUNTER — Encounter: Payer: Self-pay | Admitting: Dermatology

## 2023-05-30 DIAGNOSIS — L820 Inflamed seborrheic keratosis: Secondary | ICD-10-CM | POA: Diagnosis not present

## 2023-05-30 DIAGNOSIS — D692 Other nonthrombocytopenic purpura: Secondary | ICD-10-CM

## 2023-05-30 DIAGNOSIS — W908XXA Exposure to other nonionizing radiation, initial encounter: Secondary | ICD-10-CM | POA: Diagnosis not present

## 2023-05-30 DIAGNOSIS — L814 Other melanin hyperpigmentation: Secondary | ICD-10-CM | POA: Diagnosis not present

## 2023-05-30 DIAGNOSIS — Z1283 Encounter for screening for malignant neoplasm of skin: Secondary | ICD-10-CM

## 2023-05-30 DIAGNOSIS — L578 Other skin changes due to chronic exposure to nonionizing radiation: Secondary | ICD-10-CM

## 2023-05-30 DIAGNOSIS — Z872 Personal history of diseases of the skin and subcutaneous tissue: Secondary | ICD-10-CM | POA: Diagnosis not present

## 2023-05-30 DIAGNOSIS — L570 Actinic keratosis: Secondary | ICD-10-CM | POA: Diagnosis not present

## 2023-05-30 DIAGNOSIS — D229 Melanocytic nevi, unspecified: Secondary | ICD-10-CM

## 2023-05-30 DIAGNOSIS — D1801 Hemangioma of skin and subcutaneous tissue: Secondary | ICD-10-CM

## 2023-05-30 DIAGNOSIS — L821 Other seborrheic keratosis: Secondary | ICD-10-CM

## 2023-05-30 NOTE — Patient Instructions (Addendum)

## 2023-05-30 NOTE — Progress Notes (Signed)
 Follow-Up Visit   Subjective  Brandon Lopez is a 68 y.o. male who presents for the following: Skin Cancer Screening and Upper Body Skin Exam, hx of precancers on his face and scalp.  The patient presents for Upper Body Skin Exam (UBSE) for skin cancer screening and mole check. The patient has spots, moles and lesions to be evaluated, some may be new or changing and the patient may have concern these could be cancer.  The following portions of the chart were reviewed this encounter and updated as appropriate: medications, allergies, medical history  Review of Systems:  No other skin or systemic complaints except as noted in HPI or Assessment and Plan.  Objective  Well appearing patient in no apparent distress; mood and affect are within normal limits.  All skin waist up examined. Relevant physical exam findings are noted in the Assessment and Plan.  face,scalp x 20 (20) Erythematous thin papules/macules with gritty scale.  forehead x 3 (3) Stuck-on, waxy, tan-brown papules and plaques -- Discussed benign etiology and prognosis.   Assessment & Plan   AK (ACTINIC KERATOSIS) (20) face,scalp x 20 (20) ACTINIC DAMAGE - chronic, secondary to cumulative UV radiation exposure/sun exposure over time - diffuse scaly erythematous macules with underlying dyspigmentation - Recommend daily broad spectrum sunscreen SPF 30+ to sun-exposed areas, reapply every 2 hours as needed.  - Recommend staying in the shade or wearing long sleeves, sun glasses (UVA+UVB protection) and wide brim hats (4-inch brim around the entire circumference of the hat). - Call for new or changing lesions.  Destruction of lesion - face,scalp x 20 (20) Complexity: simple   Destruction method: cryotherapy   Informed consent: discussed and consent obtained   Timeout:  patient name, date of birth, surgical site, and procedure verified Lesion destroyed using liquid nitrogen: Yes   Region frozen until ice ball  extended beyond lesion: Yes   Outcome: patient tolerated procedure well with no complications   Post-procedure details: wound care instructions given   INFLAMED SEBORRHEIC KERATOSIS (3) forehead x 3 (3) Symptomatic, irritating, patient would like treated.  Destruction of lesion - forehead x 3 (3) Complexity: simple   Destruction method: cryotherapy   Informed consent: discussed and consent obtained   Timeout:  patient name, date of birth, surgical site, and procedure verified Lesion destroyed using liquid nitrogen: Yes   Region frozen until ice ball extended beyond lesion: Yes   Outcome: patient tolerated procedure well with no complications   Post-procedure details: wound care instructions given     Skin cancer screening performed today.   Lentigines, Seborrheic Keratoses, Hemangiomas - Benign normal skin lesions - Benign-appearing - Call for any changes  Melanocytic Nevi - Tan-brown and/or pink-flesh-colored symmetric macules and papules - Benign appearing on exam today - Observation - Call clinic for new or changing moles - Recommend daily use of broad spectrum spf 30+ sunscreen to sun-exposed areas.   Purpura - Chronic; persistent and recurrent.  Treatable, but not curable. Arms  - Violaceous macules and patches - Benign - Related to trauma, age, sun damage and/or use of blood thinners, chronic use of topical and/or oral steroids - Observe - Can use OTC arnica containing moisturizer such as Dermend Bruise Formula if desired - Call for worsening or other concerns    Return in about 1 year (around 05/29/2024) for TBSE, ASKs .  Jacob Master, CMA, am acting as scribe for Celine Collard, MD .   Documentation: I have reviewed the above documentation for  accuracy and completeness, and I agree with the above.  Celine Collard, MD

## 2023-06-04 ENCOUNTER — Other Ambulatory Visit: Payer: Self-pay | Admitting: Primary Care

## 2023-06-04 DIAGNOSIS — J449 Chronic obstructive pulmonary disease, unspecified: Secondary | ICD-10-CM

## 2023-06-07 ENCOUNTER — Ambulatory Visit (INDEPENDENT_AMBULATORY_CARE_PROVIDER_SITE_OTHER): Payer: PPO

## 2023-06-07 DIAGNOSIS — I495 Sick sinus syndrome: Secondary | ICD-10-CM | POA: Diagnosis not present

## 2023-06-08 ENCOUNTER — Ambulatory Visit: Payer: Self-pay | Admitting: Cardiology

## 2023-06-08 LAB — CUP PACEART REMOTE DEVICE CHECK
Battery Voltage: 35
Date Time Interrogation Session: 20250513075000
Implantable Lead Connection Status: 753985
Implantable Lead Connection Status: 753985
Implantable Lead Implant Date: 20140801
Implantable Lead Implant Date: 20140801
Implantable Lead Location: 753859
Implantable Lead Location: 753860
Implantable Lead Model: 362
Implantable Lead Serial Number: 29356730
Implantable Lead Serial Number: 29392661
Implantable Pulse Generator Implant Date: 20140801
Pulse Gen Serial Number: 68053398

## 2023-06-09 ENCOUNTER — Ambulatory Visit: Admitting: Psychology

## 2023-06-09 DIAGNOSIS — F331 Major depressive disorder, recurrent, moderate: Secondary | ICD-10-CM | POA: Diagnosis not present

## 2023-06-09 NOTE — Progress Notes (Signed)
 [facility] Counselor/Therapist Progress Note  Patient ID: Brandon Lopez, MRN: [id number],    Date: 06/09/2023  Time Spent: 9:02am- 9:56am Pt is seen for an in person visit.    Treatment Type: Individual Therapy  Reported Symptoms: continued depressed mood, loss of interest/motivation  Mental Status Exam: Appearance:  Well Groomed     Behavior: Appropriate  Motor: Normal  Speech/Language:  Clear and Coherent  Affect: Appropriate  Mood: depressed  Thought process: normal  Thought content:   WNL  Sensory/Perceptual disturbances:   WNL  Orientation: oriented to person, place, time/date, and situation  Attention: Good  Concentration: Good  Memory: WNL  Fund of knowledge:  Good  Insight:   Good  Judgment:  Good  Impulse Control: Good   Risk Assessment: Danger to Self:  No Self-injurious Behavior: No Danger to Others: No Duty to Warn:no Physical Aggression / Violence:No  Access to Firearms a concern: No  Gang Involvement:No   Subjective: counselor assessed pt current functioning per pt report.  Processed w/pt depressed moods and lack of motivation.  Reflected steps he has taken and plans for engaging w/ others.  Discussed behavioral activation and steps to be intentional about to plan for activities and connecting socially.   Pt affect wnl.  Pt reports continued struggle w/ feeling motivation and down moods.  Pt has been taken Prozac  for about a month and will f/u w/ PCP re: symptoms. Pt discussed positive plans for apartment social get togthers and doing some work w/ friend next week.  Pt recognizes that positive when can engage w/ these things.    Interventions: Cognitive Behavioral Therapy, Assertiveness/Communication, and supporitive  Diagnosis:Major depressive disorder, recurrent episode, moderate (HCC)  Plan: pt to f/u/ w counseling every 1-2 weeks.  Pt to f/u w/ PCP as scheduled for medication management.   Individualized Treatment  Plan Strengths: enjoys watching racing.    Supports: Brandon Lopez- girlfriend.  Bestfriend has helped him on projects.     Goal/Needs for Treatment:  In order of importance to patient 1) Increased motivation for his work/decrease loss of interest 2) improve relationship  3) cope w/ grief    Client Statement of Needs: "I want to feel motivated to complete my work and give 100% to my jobs again" "I want to get my relationship in a better place"   Treatment Level:outpatient counseling  Symptoms:depressed mood, loss of interest  Client Treatment Preferences:weekly to biweekly counseling.  Continue w/ PCP for medication management.    Healthcare consumer's goal for treatment:  Counselor, Clydie Darter, [facility] will support the patient's ability to achieve the goals identified. Cognitive Behavioral Therapy, Assertive Communication/Conflict Resolution Training, Relaxation Training, ACT, Humanistic and other evidenced-based practices will be used to promote progress towards healthy functioning.   Healthcare consumer will: Actively participate in therapy, working towards healthy functioning.    *Justification for Continuation/Discontinuation of Goal: R=Revised, O=Ongoing, A=Achieved, D=Discontinued  Goal 1) Increase coping w/ depression and increase motivation, decreased loss of interest AEB pt report.  Baseline date 05/23/23: Progress towards goal 0; How Often - Daily Target Date Goal Was reviewed Status Code Progress towards goal/Likert rating  05/22/24                Goal 2) Increase effective communication in relationships and engaging in interactions AEB pt report. Baseline date 05/23/23: Progress towards goal 0; How Often - Daily Target Date Goal Was reviewed Status Code Progress towards goal  05/22/24  Goal 3) Increase coping w/ loss of brother AEB pt expressing feelings/thoughts of grief in sessions Baseline date 05/23/23: Progress towards goal 0; How Often - Daily Target Date  Goal Was reviewed Status Code Progress towards goal  05/22/24                This plan has been reviewed and created by the following participants:  This plan will be reviewed at least every 12 months. Date Behavioral Health Clinician Date Guardian/Patient   05/23/23  [facility] Murrel Arnt [facility] 05/23/23 Verbal Consent Provided and electronic signature obtained                                   Clydie Darter [facility]

## 2023-06-13 ENCOUNTER — Ambulatory Visit: Admitting: Psychology

## 2023-06-17 ENCOUNTER — Other Ambulatory Visit: Payer: Self-pay | Admitting: Internal Medicine

## 2023-06-22 ENCOUNTER — Ambulatory Visit: Admitting: Primary Care

## 2023-06-23 ENCOUNTER — Ambulatory Visit: Admitting: Psychology

## 2023-06-23 ENCOUNTER — Ambulatory Visit
Admission: RE | Admit: 2023-06-23 | Discharge: 2023-06-23 | Disposition: A | Discharge status: Home / Self Care | Source: Ambulatory Visit | Attending: Acute Care | Admitting: Acute Care

## 2023-06-23 DIAGNOSIS — Z122 Encounter for screening for malignant neoplasm of respiratory organs: Secondary | ICD-10-CM | POA: Insufficient documentation

## 2023-06-23 DIAGNOSIS — F17210 Nicotine dependence, cigarettes, uncomplicated: Secondary | ICD-10-CM | POA: Diagnosis not present

## 2023-06-23 DIAGNOSIS — Z87891 Personal history of nicotine dependence: Secondary | ICD-10-CM | POA: Insufficient documentation

## 2023-06-23 DIAGNOSIS — F331 Major depressive disorder, recurrent, moderate: Secondary | ICD-10-CM | POA: Diagnosis not present

## 2023-06-23 NOTE — Progress Notes (Unsigned)
 [facility] Counselor/Therapist Progress Note  Patient ID: Brandon Lopez, MRN: [id number],    Date: 06/23/2023  Time Spent:1:32pm-2:35pm Pt is seen for an in person visit.    Treatment Type: Individual Therapy  Reported Symptoms: continued depressed mood and loss of interest/motivation  Mental Status Exam: Appearance:  Well Groomed     Behavior: Appropriate  Motor: Normal  Speech/Language:  Clear and Coherent  Affect: Appropriate  Mood: depressed  Thought process: normal  Thought content:   WNL  Sensory/Perceptual disturbances:   WNL  Orientation: oriented to person, place, time/date, and situation  Attention: Good  Concentration: Good  Memory: WNL  Fund of knowledge:  Good  Insight:   Good  Judgment:  Good  Impulse Control: Good   Risk Assessment: Danger to Self:  No Self-injurious Behavior: No Danger to Others: No Duty to Warn:no Physical Aggression / Violence:No  Access to Firearms a concern: No  Gang Involvement:No   Subjective: counselor assessed pt current functioning per pt report.  Processed w/pt depressed moods and lack of motivation.  Explored current relationship, pt uncertainty about partner's wants and impact having for mood.  Discussed communication and need for clarity to be able to make decisions for self to move forward.  Pt affect wnl.  Pt reports continued depressed moods and low motivation.  Pt reports he has been taken increased dose of Prozac  for almost 2 weeks.  Pt reports that he still is uncertain where he stands in relationship w/ partner.  Pt reports she hasn't expressed wants for relationship.  Pt reports still supports her and not sure if she wants relationship still or not.  Pt is clear that he would like to continue relationship and move back in together.  Pt increased insight into this playing major role in depression.  Pt feels uncertain about his direction in his life.  Pt acknowledges need to have a conversation about  to have clarity and has avoided due to worry that won't be what he wants.    Interventions: Cognitive Behavioral Therapy, Assertiveness/Communication, and supporitive  Diagnosis:Major depressive disorder, recurrent episode, moderate (HCC)  Plan: pt to f/u/ w counseling every 1-2 weeks.  Pt to f/u w/ PCP as scheduled for medication management.   Individualized Treatment Plan Strengths: enjoys watching racing.    Supports: Diane- girlfriend.  Bestfriend has helped him on projects.     Goal/Needs for Treatment:  In order of importance to patient 1) Increased motivation for his work/decrease loss of interest 2) improve relationship  3) cope w/ grief    Client Statement of Needs: "I want to feel motivated to complete my work and give 100% to my jobs again" "I want to get my relationship in a better place"   Treatment Level:outpatient counseling  Symptoms:depressed mood, loss of interest  Client Treatment Preferences:weekly to biweekly counseling.  Continue w/ PCP for medication management.    Healthcare consumer's goal for treatment:  Counselor, Clydie Darter, [facility] will support the patient's ability to achieve the goals identified. Cognitive Behavioral Therapy, Assertive Communication/Conflict Resolution Training, Relaxation Training, ACT, Humanistic and other evidenced-based practices will be used to promote progress towards healthy functioning.   Healthcare consumer will: Actively participate in therapy, working towards healthy functioning.    *Justification for Continuation/Discontinuation of Goal: R=Revised, O=Ongoing, A=Achieved, D=Discontinued  Goal 1) Increase coping w/ depression and increase motivation, decreased loss of interest AEB pt report.  Baseline date 05/23/23: Progress towards goal 0; How Often - Daily Target Date Goal Was reviewed  Status Code Progress towards goal/Likert rating  05/22/24                Goal 2) Increase effective communication in relationships and  engaging in interactions AEB pt report. Baseline date 05/23/23: Progress towards goal 0; How Often - Daily Target Date Goal Was reviewed Status Code Progress towards goal  05/22/24                Goal 3) Increase coping w/ loss of brother AEB pt expressing feelings/thoughts of grief in sessions Baseline date 05/23/23: Progress towards goal 0; How Often - Daily Target Date Goal Was reviewed Status Code Progress towards goal  05/22/24                This plan has been reviewed and created by the following participants:  This plan will be reviewed at least every 12 months. Date Behavioral Health Clinician Date Guardian/Patient   05/23/23  [facility] Murrel Arnt [facility] 05/23/23 Verbal Consent Provided and electronic signature obtained                             Clydie Darter [facility]

## 2023-06-25 ENCOUNTER — Other Ambulatory Visit: Payer: Self-pay | Admitting: Internal Medicine

## 2023-06-29 ENCOUNTER — Encounter: Payer: Self-pay | Admitting: Internal Medicine

## 2023-06-30 ENCOUNTER — Ambulatory Visit: Admitting: Psychology

## 2023-06-30 DIAGNOSIS — F331 Major depressive disorder, recurrent, moderate: Secondary | ICD-10-CM | POA: Diagnosis not present

## 2023-06-30 NOTE — Progress Notes (Signed)
 [facility] Counselor/Therapist Progress Note  Patient ID: Brandon Lopez, MRN: [id number],    Date: 06/30/2023  Time Spent:10:00am-10:55am Pt is seen for an in person visit.    Treatment Type: Individual Therapy  Reported Symptoms: pt reports struggle w/ depressed mood and loss of interest/motivation  Mental Status Exam: Appearance:  Well Groomed     Behavior: Appropriate  Motor: Normal  Speech/Language:  Clear and Coherent  Affect: Appropriate  Mood: depressed  Thought process: normal  Thought content:   WNL  Sensory/Perceptual disturbances:   WNL  Orientation: oriented to person, place, time/date, and situation  Attention: Good  Concentration: Good  Memory: WNL  Fund of knowledge:  Good  Insight:   Good  Judgment:  Good  Impulse Control: Good   Risk Assessment: Danger to Self:  No Self-injurious Behavior: No Danger to Others: No Duty to Warn:no Physical Aggression / Violence:No  Access to Firearms a concern: No  Gang Involvement:No   Subjective: Counselor assessed pt current functioning per pt report.  Processed w/pt depressed moods and lack of motivation.  Explored interactions in current relationship.  Encouraged continued communication w/ partner.  Discussed things that bring joy and engaging w/ others as positives.  Encouraged continued social connections.  Referred pt to PCP re: symptoms complaining off.  Pt affect depressed.  Pt reports depressed mood no improvement and no progress towards jobs over past couple weeks.  Pt reports struggling to feel hope for improvement.  Pt also reported on feeling off, frequent nose bleeds this week and digestive upset.  Pt agrees to f/u w/ his PCP to r/o any medical concerns. Pt reports that he hasn't talked w/ girlfriend about their relationship and considering if wants to ask to move back in or if not not best decision for self.  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  Pt agrees for continued connecting w/ friends and social opportunities.    Interventions: Cognitive Behavioral Therapy, Assertiveness/Communication, and supporitive  Diagnosis:Major depressive disorder, recurrent episode, moderate (HCC)  Plan: pt to f/u/ w counseling every 1-2 weeks.  Pt to f/u w/ PCP as scheduled for medication management.   Individualized Treatment Plan Strengths: enjoys watching racing.    Supports: Diane- girlfriend.  Bestfriend has helped him on projects.     Goal/Needs for Treatment:  In order of importance to patient 1) Increased motivation for his work/decrease loss of interest 2) improve relationship  3) cope w/ grief    Client Statement of Needs: "I want to feel motivated to complete my work and give 100% to my jobs again" "I want to get my relationship in a better place"   Treatment Level:outpatient counseling  Symptoms:depressed mood, loss of interest  Client Treatment Preferences:weekly to biweekly counseling.  Continue w/ PCP for medication management.    Healthcare consumer's goal for treatment:  Counselor, Clydie Darter, [facility] will support the patient's ability to achieve the goals identified. Cognitive Behavioral Therapy, Assertive Communication/Conflict Resolution Training, Relaxation Training, ACT, Humanistic and other evidenced-based practices will be used to promote progress towards healthy functioning.   Healthcare consumer will: Actively participate in therapy, working towards healthy functioning.    *Justification for Continuation/Discontinuation of Goal: R=Revised, O=Ongoing, A=Achieved, D=Discontinued  Goal 1) Increase coping w/ depression and increase motivation, decreased loss of interest AEB pt report.  Baseline date 05/23/23: Progress towards goal 0; How Often - Daily Target Date Goal Was reviewed Status Code  Progress towards goal/Likert rating  05/22/24                Goal 2) Increase effective  communication in relationships and engaging in interactions AEB pt report. Baseline date 05/23/23: Progress towards goal 0; How Often - Daily Target Date Goal Was reviewed Status Code Progress towards goal  05/22/24                Goal 3) Increase coping w/ loss of brother AEB pt expressing feelings/thoughts of grief in sessions Baseline date 05/23/23: Progress towards goal 0; How Often - Daily Target Date Goal Was reviewed Status Code Progress towards goal  05/22/24                This plan has been reviewed and created by the following participants:  This plan will be reviewed at least every 12 months. Date Behavioral Health Clinician Date Guardian/Patient   05/23/23  [facility] Murrel Arnt [facility] 05/23/23 Verbal Consent Provided and electronic signature obtained                             Clydie Darter [facility]               [place], [facility]

## 2023-07-01 DIAGNOSIS — F419 Anxiety disorder, unspecified: Secondary | ICD-10-CM

## 2023-07-06 MED ORDER — ESZOPICLONE 3 MG PO TABS: 3.0000 mg | ORAL_TABLET | Freq: Every day | ORAL | 5 refills | Status: DC

## 2023-07-06 NOTE — Telephone Encounter (Signed)
Eszopiclone refilled 

## 2023-07-06 NOTE — Addendum Note (Signed)
 Addended by: Rosa College D on: 07/06/2023 04:03 PM   Modules accepted: Orders

## 2023-07-06 NOTE — Telephone Encounter (Signed)
 Patient is requesting refill on eszoplicone

## 2023-07-11 MED ORDER — TRAZODONE HCL 50 MG PO TABS: ORAL_TABLET | ORAL | 3 refills | Status: DC

## 2023-07-11 NOTE — Telephone Encounter (Signed)
 Insurance wont cover eszopiclone  ,please advise on alternative

## 2023-07-11 NOTE — Telephone Encounter (Signed)
 Script sent for trazodone  as requested

## 2023-07-11 NOTE — Addendum Note (Signed)
 Addended by: Rosa College D on: 07/11/2023 01:23 PM   Modules accepted: Orders

## 2023-07-12 ENCOUNTER — Telehealth: Payer: Self-pay

## 2023-07-12 ENCOUNTER — Other Ambulatory Visit (HOSPITAL_BASED_OUTPATIENT_CLINIC_OR_DEPARTMENT_OTHER): Payer: Self-pay

## 2023-07-12 MED ORDER — TRAZODONE HCL 50 MG PO TABS: ORAL_TABLET | ORAL | 3 refills | Status: DC

## 2023-07-12 NOTE — Telephone Encounter (Signed)
 Patient has not heard from the psychiatry referral, please check on status.

## 2023-07-13 ENCOUNTER — Ambulatory Visit: Admitting: Primary Care

## 2023-07-14 ENCOUNTER — Ambulatory Visit: Admitting: Psychology

## 2023-07-15 ENCOUNTER — Other Ambulatory Visit: Payer: Self-pay | Admitting: Primary Care

## 2023-07-15 DIAGNOSIS — F419 Anxiety disorder, unspecified: Secondary | ICD-10-CM

## 2023-07-15 DIAGNOSIS — F32A Depression, unspecified: Secondary | ICD-10-CM

## 2023-07-18 MED ORDER — FLUOXETINE HCL 20 MG PO CAPS: 40.0000 mg | ORAL_CAPSULE | Freq: Every day | ORAL | 0 refills | Status: DC

## 2023-07-18 NOTE — Telephone Encounter (Unsigned)
 Copied from CRM [id number]. Topic: Clinical - Medication Question >> Jul 18, 2023  8:44 AM Aleatha BROCKS wrote: Reason for CRM: Robin from [facility] [address] [facility],  - [address] wants to know if patient  FLUoxetine  (PROZAC ) 20 MG tablet can be switch to capsules instead of tablet because it is substantially cheaper with insurance

## 2023-07-18 NOTE — Telephone Encounter (Signed)
 Yes, okay to switch to capsules. I will send new Rx.

## 2023-07-19 ENCOUNTER — Encounter: Payer: Self-pay | Admitting: Physician Assistant

## 2023-07-19 ENCOUNTER — Other Ambulatory Visit: Payer: Self-pay | Admitting: Acute Care

## 2023-07-19 ENCOUNTER — Ambulatory Visit (INDEPENDENT_AMBULATORY_CARE_PROVIDER_SITE_OTHER): Admitting: Physician Assistant

## 2023-07-19 VITALS — BP 131/97 | HR 94 | Ht 65.0 in | Wt 171.0 lb

## 2023-07-19 DIAGNOSIS — F1090 Alcohol use, unspecified, uncomplicated: Secondary | ICD-10-CM

## 2023-07-19 DIAGNOSIS — Z87891 Personal history of nicotine dependence: Secondary | ICD-10-CM

## 2023-07-19 DIAGNOSIS — F17200 Nicotine dependence, unspecified, uncomplicated: Secondary | ICD-10-CM | POA: Diagnosis not present

## 2023-07-19 DIAGNOSIS — F331 Major depressive disorder, recurrent, moderate: Secondary | ICD-10-CM | POA: Diagnosis not present

## 2023-07-19 DIAGNOSIS — F5105 Insomnia due to other mental disorder: Secondary | ICD-10-CM | POA: Diagnosis not present

## 2023-07-19 DIAGNOSIS — F99 Mental disorder, not otherwise specified: Secondary | ICD-10-CM | POA: Diagnosis not present

## 2023-07-19 DIAGNOSIS — Z122 Encounter for screening for malignant neoplasm of respiratory organs: Secondary | ICD-10-CM

## 2023-07-19 DIAGNOSIS — F411 Generalized anxiety disorder: Secondary | ICD-10-CM

## 2023-07-19 DIAGNOSIS — F17210 Nicotine dependence, cigarettes, uncomplicated: Secondary | ICD-10-CM

## 2023-07-19 MED ORDER — DULOXETINE HCL 30 MG PO CPEP: 30.0000 mg | ORAL_CAPSULE | Freq: Every day | ORAL | 0 refills | Status: DC

## 2023-07-19 MED ORDER — DULOXETINE HCL 60 MG PO CPEP: 60.0000 mg | ORAL_CAPSULE | Freq: Every day | ORAL | 1 refills | Status: DC

## 2023-07-19 NOTE — Progress Notes (Signed)
 [facility] MD/PA/NP Initial Note  07/19/2023 8:23 AM Brandon Lopez  MRN:  [id number]  Chief Complaint:  Chief Complaint   Establish Care    HPI:   Depression for years.  Anhedonia, dec appetite, does odd jobs, doesn't want to go to work some days.  Feels sad, not crying, sometimes w/ dec hygiene. Inspire earlier this year for OSA, unsure how well it's working. Is often anxious. No PA, just generally overwhelmed at times. Relationship stressors w/ GF for years. Patient denies increased energy with decreased need for sleep, increased talkativeness, racing thoughts, impulsivity or risky behaviors, increased spending, increased libido, grandiosity, increased irritability or anger, paranoia, or hallucinations.  Denies suicidal or homicidal thoughts.  Smoked since 68 yo.  Drinking about as long. Helps w/ anxiety and to escape from life sometimes.   Visit Diagnosis:    ICD-10-CM   1. Major depressive disorder, recurrent episode, moderate (HCC)  F33.1     2. Generalized anxiety disorder  F41.1     3. Insomnia due to other mental disorder  F51.05    F99     4. Smoker  F17.200     5. Alcohol use disorder  F10.90      Past Psychiatric History:   Past medications for mental health diagnoses include: He's been on many different antidepressants, remembers Wellbutrin  for smoking cessation. Help depression a little but pooped out.  Prozac , Zoloft , Effexor . Chantix  Gabapentin  Lunesta  Trazodone   Never hosp for mental health reasons  Past Medical History:  Past Medical History:  Diagnosis Date   Actinic keratosis    Arthritis    Chronic systolic CHF (congestive heart failure) (HCC)    a. echo 6/16: EF 55-60%, Gr1DD, LA nl, PASP nl; b. echo 7/19: EF 40-45%, unable to exclude RWMA, Gr1DD, LA nl, RVSF nl, PASP nl   COPD (chronic obstructive pulmonary disease) (HCC)    Depression    Diverticulitis of descending colon 10/27/2021   Diverticulosis    Found during colonoscopy   Edema  07/21/2016   Epistaxis 01/04/2023   High blood monocyte count 06/17/2021   History of kidney stones    Hypertension    Hypotension due to drugs 06/17/2021   Left facial swelling 12/11/2021   Left lower quadrant abdominal pain 10/27/2021   Lightheadedness 04/21/2021   Oral lesion 12/11/2021   Other fatigue 04/21/2021   PAF (paroxysmal atrial fibrillation) (HCC)    a. short episodes previously noted on PPM interrogation; b. started on Xarelto  07/2017 given further episodes of Afib noted during interrogation; c. 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 Biotronik; Jeanett DR-T [id number]; placed 08/2012   Rectal bleeding 09/19/2018   Shortness of breath 04/21/2021   Skin lesions 07/03/2020   Sleep apnea    does not use CPAP   Symptomatic bradycardia    a. s/p Biotronik; Jeanett DR-T [id number]; placed 08/2012 at [facility]   Syncope 08/10/2017    Past Surgical History:  Procedure Laterality Date   APPENDECTOMY  age 39   CARDIAC CATHETERIZATION     7 years ago   COLONOSCOPY N/A 05/27/2014   Procedure: COLONOSCOPY;  Surgeon: Deward CINDERELLA Piedmont, MD;  Location: [facility];  Service: Gastroenterology;  Laterality: N/A;   DRUG INDUCED ENDOSCOPY Bilateral 11/11/2021   Procedure: DRUG INDUCED ENDOSCOPY;  Surgeon: Carlie Clark, MD;  Location: [facility];  Service: ENT;  Laterality: Bilateral;   FLOOR OF MOUTH BIOPSY N/A 01/05/2022   Procedure: BIOPSY OF PALATE;  Surgeon: Carlie Clark, MD;  Location: [facility];  Service: ENT;  Laterality: N/A;   IMPLANTATION OF HYPOGLOSSAL NERVE STIMULATOR Left 01/05/2022   Procedure: IMPLANTATION OF HYPOGLOSSAL NERVE STIMULATOR;  Surgeon: Carlie Clark, MD;  Location: [facility];  Service: ENT;  Laterality: Left;   INSERT / REPLACE / REMOVE PACEMAKER     3 years ago    Family Psychiatric History:  See  below  Family History:  Family History  Problem Relation Age of Onset   Arthritis Mother    Heart disease Father    Hyperlipidemia Father    Hypertension Father    Cancer Paternal Grandfather    Hyperlipidemia Brother    Social History:  Social History   Socioeconomic History   Marital status: Single    Spouse name: Not on file   Number of children: Not on file   Years of education: Not on file   Highest education level: GED or equivalent  Occupational History   Not on file  Tobacco Use   Smoking status: Every Day    Current packs/day: 0.00    Average packs/day: 2.0 packs/day for 59.0 years (118.0 ttl pk-yrs)    Types: Cigarettes    Start date: 1964    Last attempt to quit: 02/01/2021    Years since quitting: 2.4   Smokeless tobacco: Never   Tobacco comments:    Smoking 1 pack   Vaping Use   Vaping status: Never Used  Substance and Sexual Activity   Alcohol use: Yes    Alcohol/week: 28.0 - 35.0 standard drinks of alcohol    Types: 28 - 35 Cans of beer per week    Comment: daily beer   Drug use: Yes    Types: Marijuana    Comment: states smokes daily, last 11-09-21   Sexual activity: Yes  Other Topics Concern   Not on file  Social History Narrative   Not on file   Social Drivers of Health   Financial Resource Strain: Medium Risk (07/10/2023)   Overall Financial Resource Strain (CARDIA)    Difficulty of Paying Living Expenses: Somewhat hard  Food Insecurity: Food Insecurity Present (07/10/2023)   Hunger Vital Sign    Worried About Running Out of Food in the Last Year: Sometimes true    Ran Out of Food in the Last Year: Sometimes true  Transportation Needs: No Transportation Needs (07/10/2023)   PRAPARE - Administrator, Civil Service (Medical): No    Lack of Transportation (Non-Medical): No  Physical Activity: Insufficiently Active (07/10/2023)   Exercise Vital Sign    Days of Exercise per Week: 5 days    Minutes of Exercise per Session: 20 min   Stress: Stress Concern Present (07/10/2023)   [employer] of Occupational Health - Occupational Stress Questionnaire    Feeling of Stress: Very much  Social Connections: Socially Isolated (07/10/2023)   Social Connection and Isolation Panel    Frequency of Communication with Friends and Family: Never    Frequency of Social Gatherings with Friends and Family: Never    Attends Religious Services: Never    Database administrator or Organizations: No    Attends Engineer, structural: Not on file    Marital Status: Divorced    Allergies:  Allergies  Allergen Reactions   Simvastatin Diarrhea   Lisinopril  Cough    Metabolic Disorder  Labs: Lab Results  Component Value Date   HGBA1C 5.7 01/18/2023   No results found for: PROLACTIN Lab Results  Component Value Date   CHOL 143 01/18/2023   TRIG 124.0 01/18/2023   HDL 44.20 01/18/2023   CHOLHDL 3 01/18/2023   VLDL 24.8 01/18/2023   LDLCALC 74 01/18/2023   LDLCALC 72 12/11/2020   Lab Results  Component Value Date   TSH 2.67 06/17/2021   TSH 3.19 04/21/2021    Therapeutic Level Labs: No results found for: LITHIUM No results found for: VALPROATE No results found for: CBMZ  Current Medications: Current Outpatient Medications  Medication Sig Dispense Refill   albuterol  (VENTOLIN  HFA) 108 (90 Base) MCG/ACT inhaler INHALE 2 PUFFS BY MOUTH EVERY 6 HOURS AS NEEDED FOR WHEEZING AND FOR SHORTNESS OF BREATH 18 g 0   aspirin  EC 81 MG tablet Take 81 mg by mouth daily. Swallow whole.     atorvastatin  (LIPITOR) 20 MG tablet TAKE 1 TABLET BY MOUTH ONCE DAILY FOR CHOLESTEROL 90 tablet 2   Budeson-Glycopyrrol-Formoterol  (BREZTRI  AEROSPHERE) 160-9-4.8 MCG/ACT AERO Inhale 2 puffs into the lungs 2 (two) times daily. 10.7 g 11   carvedilol  (COREG ) 3.125 MG tablet Take 1 tablet (3.125 mg total) by mouth 2 (two) times daily. 180 tablet 1   DULoxetine  (CYMBALTA ) 30 MG capsule Take 1 capsule (30 mg total) by mouth daily. 14  capsule 0   DULoxetine  (CYMBALTA ) 60 MG capsule Take 1 capsule (60 mg total) by mouth daily. 30 capsule 1   ezetimibe  (ZETIA ) 10 MG tablet Take 1 tablet by mouth once daily 90 tablet 0   pantoprazole  (PROTONIX ) 20 MG tablet TAKE 1 TABLET BY MOUTH ONCE DAILY FOR  HEARTBURN 90 tablet 2   tadalafil  (CIALIS ) 5 MG tablet TAKE 1 TABLET BY MOUTH ONCE DAILY FOR FREQUENT URINATION 90 tablet 2   telmisartan -hydrochlorothiazide  (MICARDIS  HCT) 40-12.5 MG tablet Take 1 tablet by mouth once daily for blood pressure 90 tablet 1   traZODone  (DESYREL ) 50 MG tablet 1 or 2 for sleep as needed 60 tablet 3   EPINEPHrine  0.3 mg/0.3 mL IJ SOAJ injection Inject 0.3 mg into the muscle as needed for anaphylaxis. 1 each 0   folic acid  (FOLVITE ) 1 MG tablet Take 1 tablet by mouth once daily (Patient not taking: Reported on 07/21/2023) 90 tablet 0   triamcinolone  cream (KENALOG ) 0.1 % Apply 1 Application topically 2 (two) times daily as needed. For rash. (Patient not taking: Reported on 07/21/2023) 30 g 0   No current facility-administered medications for this visit.   Medication Side Effects: none  Orders placed this visit:  No orders of the defined types were placed in this encounter.   Psychiatric Specialty Exam:  Review of Systems  Constitutional:  Positive for fatigue.  HENT:  Positive for nosebleeds.   Eyes: Negative.   Respiratory:  Positive for cough.        Chronic, with smoking. No SOB  Cardiovascular: Negative.   Gastrointestinal:  Positive for diarrhea and nausea.       Stress related  Endocrine: Negative.   Genitourinary: Negative.   Musculoskeletal:  Positive for arthralgias and myalgias.  Skin: Negative.   Allergic/Immunologic: Negative.   Neurological: Negative.   Hematological: Negative.   Psychiatric/Behavioral:         See HPI    Blood pressure (!) 131/97, pulse 94, height 5' 5 (1.651 m), weight 171 lb (77.6 kg).Body mass index is 28.46 kg/m.  General Appearance: Casual and Well Groomed  Eye Contact:  Good  Speech:  Clear and Coherent and Normal Rate  Volume:  Normal  Mood:  Depressed  Affect:  Depressed and Anxious  Thought Process:  Goal Directed and Descriptions of Associations: Circumstantial  Orientation:  Full (Time, Place, and Person)  Thought Content: Logical   Suicidal Thoughts:  No  Homicidal Thoughts:  No  Memory:  WNL  Judgement:  Good  Insight:  Good  Psychomotor Activity:  Normal  Concentration:  Concentration: Good  Recall:  Good  Fund of Knowledge: Good  Language: Good  Assets:  Communication Skills Desire for Improvement Financial Resources/Insurance Housing Transportation Vocational/Educational  ADL's:  Intact  Cognition: WNL  Prognosis:  Good   Screenings:  AUDIT    Designer, multimedia from 07/13/2023 in [facility] Office Visit from 01/18/2023 in [facility] [place] Office Visit from 11/17/2022 in [facility] [place] [facility] Office Visit from 05/27/2022 in [facility] [place] [facility] Clinical Support from 07/15/2021 in [facility] [place] [facility]  Alcohol Use Disorder Identification Test Final Score (AUDIT) 10  6  10  12 4    GAD-7    Flowsheet Row Office Visit from 05/12/2023 in [facility] [place] [facility] [place] Office Visit from 02/17/2023 in [facility] [place] [facility] [place] Office Visit from 01/18/2023 in [facility] Office Visit from 05/27/2022 in [facility]  Total GAD-7 Score 16 1 0 11   Mini-Mental    Flowsheet Row Clinical Support from 12/06/2017 in [facility] [place] [facility] Clinical Support from 11/05/2015 in [facility]  Total Score (max 30 points ) 20 20   PHQ2-9    Flowsheet Row Office Visit from 05/12/2023 in [facility] [place] [facility]  Office Visit from 02[facility]17/2023 in [facility] [place] [facility] Office Visit from 01[facility]18/2023 in [facility] [place] [facility] Office Visit from 01/04/2023 in [facility] Clinical Support from 07/28/2022 in [facility]  PHQ-2 Total Score 6 3 0 1 0  PHQ-9 Total Score 26 6 0 1 --   Flowsheet Row UC from 05/06/2023 in [facility]  ED from 01/08/2023 in [facility] at [facility] Admission (Discharged) from 01/05/2022 in [facility]  C-SSRS RISK CATEGORY No Risk No Risk No Risk   Receiving Psychotherapy: Yes   with Arvin Herald  Treatment Plan/Recommendations:   PDMP reviewed.  Lunesta  filled 02/18/2023. I provided approximately 65 minutes of face to face time during this encounter, including time spent before and after the visit in records review, medical decision making, counseling pertinent to today's visit, and charting.   We discussed the depression and treatment options. I recommend starting Cymbalta , which hopefully will help chronic pain as well.  Benefits, risks and side effects were discussed and he accepts.  I provided 20 mins in counseling on risks of smoking and ETOH use.  Offered Naltrexone and/or Acamprosate, behavioral techniques, AA.  We agreed to do 1 thing at her time though and decided that treating the depression is the most important thing.  He will try to cut back on the amount of alcohol and cigarettes he consumes each day.  Wean off Prozac . See AVS.  Start Cymbalta  30 mg, 1  p.o. daily for 2 weeks, then increase to 60 mg daily. Continue trazodone  50 mg, 1-2 p.o. nightly as needed sleep. Recommend multivitamin, B complex, fish oil, and vitamin D. Recommend counseling. Return in 4 to 6 weeks.  Verneita Cooks, PA-C

## 2023-07-19 NOTE — Patient Instructions (Signed)
 Wean off the Prozac  20 mg by taking 1 pill daily for 1 week, tjem stop. At the same time, start Cymbalta 30 mg daily for 2 weeks, then increase to 60 mg daily.

## 2023-07-21 ENCOUNTER — Encounter: Payer: Self-pay | Admitting: Primary Care

## 2023-07-21 ENCOUNTER — Ambulatory Visit (INDEPENDENT_AMBULATORY_CARE_PROVIDER_SITE_OTHER): Admitting: Primary Care

## 2023-07-21 ENCOUNTER — Ambulatory Visit: Payer: Self-pay | Admitting: Primary Care

## 2023-07-21 VITALS — BP 106/68 | HR 76 | Temp 97.3°F | Ht 65.0 in | Wt 178.0 lb

## 2023-07-21 DIAGNOSIS — L509 Urticaria, unspecified: Secondary | ICD-10-CM | POA: Diagnosis not present

## 2023-07-21 LAB — CBC WITH DIFFERENTIAL/PLATELET
Basophils Absolute: 0.1 10*3/uL (ref 0.0–0.1)
Basophils Relative: 0.8 % (ref 0.0–3.0)
Eosinophils Absolute: 0.1 10*3/uL (ref 0.0–0.7)
Eosinophils Relative: 1.2 % (ref 0.0–5.0)
HCT: 47.3 % (ref 39.0–52.0)
Hemoglobin: 16 g/dL (ref 13.0–17.0)
Lymphocytes Relative: 28.8 % (ref 12.0–46.0)
Lymphs Abs: 2.9 10*3/uL (ref 0.7–4.0)
MCHC: 33.8 g/dL (ref 30.0–36.0)
MCV: 92.5 fl (ref 78.0–100.0)
Monocytes Absolute: 0.8 10*3/uL (ref 0.1–1.0)
Monocytes Relative: 7.8 % (ref 3.0–12.0)
Neutro Abs: 6.1 10*3/uL (ref 1.4–7.7)
Neutrophils Relative %: 61.4 % (ref 43.0–77.0)
Platelets: 260 10*3/uL (ref 150.0–400.0)
RBC: 5.11 Mil/uL (ref 4.22–5.81)
RDW: 12.6 % (ref 11.5–15.5)
WBC: 10 10*3/uL (ref 4.0–10.5)

## 2023-07-21 LAB — COMPREHENSIVE METABOLIC PANEL WITH GFR
ALT: 18 U/L (ref 0–53)
AST: 16 U/L (ref 0–37)
Albumin: 4 g/dL (ref 3.5–5.2)
Alkaline Phosphatase: 92 U/L (ref 39–117)
BUN: 9 mg/dL (ref 6–23)
CO2: 27 meq/L (ref 19–32)
Calcium: 9.1 mg/dL (ref 8.4–10.5)
Chloride: 101 meq/L (ref 96–112)
Creatinine, Ser: 0.72 mg/dL (ref 0.40–1.50)
GFR: 94.31 mL/min (ref >60.00)
Glucose, Bld: 120 mg/dL — ABNORMAL HIGH (ref 70–99)
Potassium: 4.1 meq/L (ref 3.5–5.1)
Sodium: 137 meq/L (ref 135–145)
Total Bilirubin: 0.6 mg/dL (ref 0.2–1.2)
Total Protein: 6.4 g/dL (ref 6.0–8.3)

## 2023-07-21 MED ORDER — EPINEPHRINE 0.3 MG/0.3ML IJ SOAJ: 0.3000 mg | INTRAMUSCULAR | 0 refills | Status: AC | PRN

## 2023-07-21 NOTE — Assessment & Plan Note (Addendum)
 Exam today and further representative of hives.  Unclear etiology as nothing has changed. Checking labs today for alpha gal and food allergy panel.  Start Claritin  10 mg once daily. Prescription for EpiPen  provided.  Discussed instructions for use.  Await results.

## 2023-07-21 NOTE — Patient Instructions (Signed)
 Stop by the lab prior to leaving today. I will notify you of your results once received.   Only use the EpiPen  if you experience throat tightness and trouble breathing when you have the rash.  It was a pleasure to see you today!

## 2023-07-21 NOTE — Progress Notes (Signed)
 Subjective:    Patient ID: Brandon Lopez, male    DOB: 08-Jun-1955, 68 y.o.   MRN: [id number]  Urticaria    Jen Eppinger is a very pleasant 68 y.o. male with a history of hypertension, senile purpura, COPD, alcohol abuse, lipidemia who presents today to discuss hives.  Symptom onset 2 weeks ago with hive like rash to the left thigh. Since then he's noticed the hives have returned to the upper extremities and chest. This morning he noticed the same hives to his right lateral thigh which has since resolved.   He's been taking Benadryl  with temporary resolve. He did not take benadryl  this morning. He denies tick bites, throat tightness, wheezing, shortness of breath.   No new lotions, detergents, soaps or shampoos. No new vitamins, supplements. He was switched to Cymbalta 2 days ago.   No new pets. No recent outdoor exposure or poison ivy exposure. No bonfire or smoke exposure.  No recent motel or hotel stay or new beds.   No fevers/chills, oral lesions, new joint pains, tick bites, abdominal pain, nausea.   He brings a picture with him today.  Last night for dinner he had chicken and rice.   Review of Systems  HENT:  Negative for trouble swallowing.   Respiratory:  Negative for chest tightness and wheezing.   Skin:  Positive for color change and rash.         Past Medical History:  Diagnosis Date   Actinic keratosis    Arthritis    Chronic systolic CHF (congestive heart failure) (HCC)    a. echo 6/16: EF 55-60%, Gr1DD, LA nl, PASP nl; b. echo 7/19: EF 40-45%, unable to exclude RWMA, Gr1DD, LA nl, RVSF nl, PASP nl   COPD (chronic obstructive pulmonary disease) (HCC)    Depression    Diverticulitis of descending colon 10/27/2021   Diverticulosis    Found during colonoscopy   Edema 07/21/2016   Epistaxis 01/04/2023   High blood monocyte count 06/17/2021   History of kidney stones    Hypertension    Hypotension due to drugs 06/17/2021   Left facial  swelling 12/11/2021   Left lower quadrant abdominal pain 10/27/2021   Lightheadedness 04/21/2021   Oral lesion 12/11/2021   Other fatigue 04/21/2021   PAF (paroxysmal atrial fibrillation) (HCC)    a. short episodes previously noted on PPM interrogation; b. started on Xarelto  07/2017 given further episodes of Afib noted during interrogation; c. 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 Biotronik; Jeanett DR-T [id number]; placed 08/2012   Rectal bleeding 09/19/2018   Shortness of breath 04/21/2021   Skin lesions 07/03/2020   Sleep apnea    does not use CPAP   Symptomatic bradycardia    a. s/p Biotronik; Jeanett DR-T [id number]; placed 08/2012 at [facility]   Syncope 08/10/2017    Social History   Socioeconomic History   Marital status: Single    Spouse name: Not on file   Number of children: Not on file   Years of education: Not on file   Highest education level: GED or equivalent  Occupational History   Not on file  Tobacco Use   Smoking status: Every Day    Current packs/day: 0.00    Average packs/day: 2.0 packs/day for 59.0 years (118.0 ttl pk-yrs)    Types: Cigarettes    Start date: 1964  Last attempt to quit: 02/01/2021    Years since quitting: 2.4   Smokeless tobacco: Never   Tobacco comments:    Smoking 1 pack   Vaping Use   Vaping status: Never Used  Substance and Sexual Activity   Alcohol use: Yes    Alcohol/week: 28.0 - 35.0 standard drinks of alcohol    Types: 28 - 35 Cans of beer per week    Comment: daily beer   Drug use: Yes    Types: Marijuana    Comment: states smokes daily, last 11-09-21   Sexual activity: Yes  Other Topics Concern   Not on file  Social History Narrative   Not on file   Social Drivers of Health   Financial Resource Strain: Medium Risk (07/10/2023)   Overall Financial Resource Strain (CARDIA)    Difficulty of Paying Living  Expenses: Somewhat hard  Food Insecurity: Food Insecurity Present (07/10/2023)   Hunger Vital Sign    Worried About Running Out of Food in the Last Year: Sometimes true    Ran Out of Food in the Last Year: Sometimes true  Transportation Needs: No Transportation Needs (07/10/2023)   PRAPARE - Administrator, Civil Service (Medical): No    Lack of Transportation (Non-Medical): No  Physical Activity: Insufficiently Active (07/10/2023)   Exercise Vital Sign    Days of Exercise per Week: 5 days    Minutes of Exercise per Session: 20 min  Stress: Stress Concern Present (07/10/2023)   [employer] of Occupational Health - Occupational Stress Questionnaire    Feeling of Stress: Very much  Social Connections: Socially Isolated (07/10/2023)   Social Connection and Isolation Panel    Frequency of Communication with Friends and Family: Never    Frequency of Social Gatherings with Friends and Family: Never    Attends Religious Services: Never    Database administrator or Organizations: No    Attends Engineer, structural: Not on file    Marital Status: Divorced  Intimate Partner Violence: Not At Risk (07/28/2022)   Humiliation, Afraid, Rape, and Kick questionnaire    Fear of Current or Ex-Partner: No    Emotionally Abused: No    Physically Abused: No    Sexually Abused: No    Past Surgical History:  Procedure Laterality Date   APPENDECTOMY  age 36   CARDIAC CATHETERIZATION     7 years ago   COLONOSCOPY N/A 05/27/2014   Procedure: COLONOSCOPY;  Surgeon: Deward CINDERELLA Piedmont, MD;  Location: [facility];  Service: Gastroenterology;  Laterality: N/A;   DRUG INDUCED ENDOSCOPY Bilateral 11/11/2021   Procedure: DRUG INDUCED ENDOSCOPY;  Surgeon: Carlie Talea Manges, MD;  Location: [facility];  Service: ENT;  Laterality: Bilateral;   FLOOR OF MOUTH BIOPSY N/A 01/05/2022   Procedure: BIOPSY OF PALATE;  Surgeon: Carlie Auron Tadros, MD;  Location: [facility];  Service: ENT;   Laterality: N/A;   IMPLANTATION OF HYPOGLOSSAL NERVE STIMULATOR Left 01/05/2022   Procedure: IMPLANTATION OF HYPOGLOSSAL NERVE STIMULATOR;  Surgeon: Carlie Harbor Vanover, MD;  Location: [facility];  Service: ENT;  Laterality: Left;   INSERT / REPLACE / REMOVE PACEMAKER     3 years ago    Family History  Problem Relation Age of Onset   Arthritis Mother    Heart disease Father    Hyperlipidemia Father    Hypertension Father    Cancer Paternal Grandfather    Hyperlipidemia Brother     Allergies  Allergen Reactions  Simvastatin Diarrhea   Lisinopril  Cough    Current Outpatient Medications on File Prior to Visit  Medication Sig Dispense Refill   albuterol  (VENTOLIN  HFA) 108 (90 Base) MCG/ACT inhaler INHALE 2 PUFFS BY MOUTH EVERY 6 HOURS AS NEEDED FOR WHEEZING AND FOR SHORTNESS OF BREATH 18 g 0   aspirin  EC 81 MG tablet Take 81 mg by mouth daily. Swallow whole.     atorvastatin  (LIPITOR) 20 MG tablet TAKE 1 TABLET BY MOUTH ONCE DAILY FOR CHOLESTEROL 90 tablet 2   Budeson-Glycopyrrol-Formoterol  (BREZTRI  AEROSPHERE) 160-9-4.8 MCG/ACT AERO Inhale 2 puffs into the lungs 2 (two) times daily. 10.7 g 11   carvedilol  (COREG ) 3.125 MG tablet Take 1 tablet (3.125 mg total) by mouth 2 (two) times daily. 180 tablet 1   DULoxetine (CYMBALTA) 30 MG capsule Take 1 capsule (30 mg total) by mouth daily. 14 capsule 0   DULoxetine (CYMBALTA) 60 MG capsule Take 1 capsule (60 mg total) by mouth daily. 30 capsule 1   ezetimibe  (ZETIA ) 10 MG tablet Take 1 tablet by mouth once daily 90 tablet 0   pantoprazole  (PROTONIX ) 20 MG tablet TAKE 1 TABLET BY MOUTH ONCE DAILY FOR  HEARTBURN 90 tablet 2   tadalafil  (CIALIS ) 5 MG tablet TAKE 1 TABLET BY MOUTH ONCE DAILY FOR FREQUENT URINATION 90 tablet 2   telmisartan -hydrochlorothiazide  (MICARDIS  HCT) 40-12.5 MG tablet Take 1 tablet by mouth once daily for blood pressure 90 tablet 1   traZODone  (DESYREL ) 50 MG tablet 1 or 2 for sleep as needed 60 tablet 3    folic acid  (FOLVITE ) 1 MG tablet Take 1 tablet by mouth once daily (Patient not taking: Reported on 07/21/2023) 90 tablet 0   triamcinolone  cream (KENALOG ) 0.1 % Apply 1 Application topically 2 (two) times daily as needed. For rash. (Patient not taking: Reported on 07/21/2023) 30 g 0   No current facility-administered medications on file prior to visit.    BP 106/68   Pulse 76   Temp (!) 97.3 F (36.3 C) (Temporal)   Ht 5' 5 (1.651 m)   Wt 178 lb (80.7 kg)   SpO2 96%   BMI 29.62 kg/m  Objective:   Physical Exam  Cardiovascular:     Rate and Rhythm: Normal rate and regular rhythm.  Pulmonary:     Effort: Pulmonary effort is normal.     Breath sounds: Normal breath sounds.   Musculoskeletal:     Cervical back: Neck supple.   Skin:    General: Skin is warm and dry.     Comments: Large area of raised, mildly pink, rash to right lateral upper thigh.   Neurological:     Mental Status: He is alert and oriented to person, place, and time.   Psychiatric:        Mood and Affect: Mood normal.           Assessment & Plan:  Hives Assessment & Plan: Exam today and further representative of hives.  Unclear etiology as nothing has changed. Checking labs today for alpha gal and food allergy panel.  Start Claritin  10 mg once daily. Prescription for EpiPen  provided.  Discussed instructions for use.  Await results.  Orders: -     Food Allergy Profile -     Alpha-Gal Panel -     EPINEPHrine ; Inject 0.3 mg into the muscle as needed for anaphylaxis.  Dispense: 1 each; Refill: 0 -     CBC with Differential/Platelet -     Comprehensive metabolic panel with  GFR        Comer MARLA Gaskins, NP

## 2023-07-22 NOTE — Addendum Note (Signed)
 Addended by: VICCI SELLER A on: 07/22/2023 09:34 AM   Modules accepted: Orders

## 2023-07-22 NOTE — Progress Notes (Signed)
 Remote pacemaker transmission.

## 2023-07-26 LAB — FOOD ALLERGY PROFILE

## 2023-07-26 LAB — ALPHA-GAL PANEL
Allergen, Mutton, f88: 0.17 kU/L — ABNORMAL HIGH
Allergen, Pork, f26: 0.12 kU/L — ABNORMAL HIGH
Beef: 0.22 kU/L — ABNORMAL HIGH
GALACTOSE-ALPHA-1,3-GALACTOSE IGE*: 0.52 kU/L — ABNORMAL HIGH (ref <0.10)

## 2023-07-26 LAB — INTERPRETATION:

## 2023-08-01 ENCOUNTER — Telehealth: Payer: Self-pay | Admitting: Physician Assistant

## 2023-08-01 NOTE — Telephone Encounter (Signed)
 Taking at night is a good idea. Have him try to push through these SE, it should get better over the next few weeks.  Have him take 30 mg daily for another 2 weeks (send in please) and then increase to 60 mg.

## 2023-08-01 NOTE — Telephone Encounter (Signed)
 Pt called in at 8:42a stating the Cymbalta  is giving his weird side affects and anxiety. Pt would like a cb to discuss.

## 2023-08-01 NOTE — Telephone Encounter (Signed)
 Pt seen 6/24 for first visit:  Wean off the Prozac  20 mg by taking 1 pill daily for 1 week, then stop. At the same time, start Cymbalta  30 mg daily for 2 weeks, then increase to 60 mg daily.  He has been off Prozac  since Wednesday and should increase Cymbalta  to 60 mg on this Wednesday. He reports for the last 3 days he can't get out of bed until noon because he is so sleepy. He gets up but then wants to go back to bed. Pharmacist told him to take Cymbalta  at night because it could cause drowsiness. He is also reporting anxiety, nervousness, and paranoia, couldn't go to work today due to sx.  No new stressors. He is sleeping well.  [facility] - [place]

## 2023-08-02 ENCOUNTER — Ambulatory Visit (INDEPENDENT_AMBULATORY_CARE_PROVIDER_SITE_OTHER): Payer: PPO

## 2023-08-02 ENCOUNTER — Encounter: Payer: PPO | Admitting: Primary Care

## 2023-08-02 VITALS — BP 118/78 | Ht 65.0 in | Wt 178.0 lb

## 2023-08-02 DIAGNOSIS — Z2821 Immunization not carried out because of patient refusal: Secondary | ICD-10-CM | POA: Diagnosis not present

## 2023-08-02 DIAGNOSIS — Z0000 Encounter for general adult medical examination without abnormal findings: Secondary | ICD-10-CM | POA: Diagnosis not present

## 2023-08-02 MED ORDER — DULOXETINE HCL 30 MG PO CPEP: 30.0000 mg | ORAL_CAPSULE | Freq: Every day | ORAL | 0 refills | Status: DC

## 2023-08-02 NOTE — Telephone Encounter (Signed)
 Discussed recommendations with patient. Sent in Rx for 30 mg x 2 weeks to [facility] in [place].

## 2023-08-02 NOTE — Progress Notes (Signed)
 Because this visit was a virtual/telehealth visit,  certain criteria was not obtained, such a blood pressure, CBG if applicable, and timed get up and go. Any medications not marked as taking were not mentioned during the medication reconciliation part of the visit. Any vitals not documented were not able to be obtained due to this being a telehealth visit or patient was unable to self-report a recent blood pressure reading due to a lack of equipment at home via telehealth. Vitals that have been documented are verbally provided by the patient.   This visit was performed by a medical professional under my direct supervision. I was immediately available for consultation/collaboration. I have reviewed and agree with the Annual Wellness Visit documentation.  Subjective:   Brandon Lopez is a 68 y.o. who presents for a Medicare Wellness preventive visit.  As a reminder, Annual Wellness Visits don't include a physical exam, and some assessments may be limited, especially if this visit is performed virtually. We may recommend an in-person follow-up visit with your provider if needed.  Visit Complete: Virtual I connected with  Brandon Lopez on 08/02/23 by a audio enabled telemedicine application and verified that I am speaking with the correct person using two identifiers.  Patient Location: Home  Provider Location: Home Office  I discussed the limitations of evaluation and management by telemedicine. The patient expressed understanding and agreed to proceed.  Vital Signs: Because this visit was a virtual/telehealth visit, some criteria may be missing or patient reported. Any vitals not documented were not able to be obtained and vitals that have been documented are patient reported.  VideoDeclined- This patient declined Librarian, academic. Therefore the visit was completed with audio only.  Persons Participating in Visit: Patient.  AWV Questionnaire: Yes:  Patient Medicare AWV questionnaire was completed by the patient on 08/01/2023; I have confirmed that all information answered by patient is correct and no changes since this date.  Cardiac Risk Factors include: advanced age (>61men, >21 women);male gender;dyslipidemia;hypertension;Other (see comment), Risk factor comments: afib     Objective:    Today's Vitals   08/02/23 1149  BP: 118/78  Weight: 178 lb (80.7 kg)  Height: 5' 5 (1.651 m)   Body mass index is 29.62 kg/m.     08/02/2023   11:53 AM 01/08/2023    3:07 PM 07/28/2022    9:56 AM 05/04/2022    8:53 PM 01/05/2022   10:12 AM 12/30/2021   10:02 AM 11/11/2021    8:46 AM  Advanced Directives  Does Patient Have a Medical Advance Directive? No No No No No No Yes  Type of Advance Directive       Healthcare Power of Attorney  Does patient want to make changes to medical advance directive?       No - Patient declined  Copy of Healthcare Power of Attorney in Chart?       No - copy requested  Would patient like information on creating a medical advance directive? No - Patient declined No - Patient declined No - Patient declined No - Patient declined No - Patient declined No - Patient declined     Current Medications (verified) Outpatient Encounter Medications as of 08/02/2023  Medication Sig   albuterol  (VENTOLIN  HFA) 108 (90 Base) MCG/ACT inhaler INHALE 2 PUFFS BY MOUTH EVERY 6 HOURS AS NEEDED FOR WHEEZING AND FOR SHORTNESS OF BREATH   aspirin  EC 81 MG tablet Take 81 mg by mouth daily. Swallow whole.   atorvastatin  (LIPITOR)  20 MG tablet TAKE 1 TABLET BY MOUTH ONCE DAILY FOR CHOLESTEROL   Budeson-Glycopyrrol-Formoterol  (BREZTRI  AEROSPHERE) 160-9-4.8 MCG/ACT AERO Inhale 2 puffs into the lungs 2 (two) times daily.   carvedilol  (COREG ) 3.125 MG tablet Take 1 tablet (3.125 mg total) by mouth 2 (two) times daily.   DULoxetine  (CYMBALTA ) 30 MG capsule Take 1 capsule (30 mg total) by mouth daily.   DULoxetine  (CYMBALTA ) 60 MG capsule Take 1  capsule (60 mg total) by mouth daily.   EPINEPHrine  0.3 mg/0.3 mL IJ SOAJ injection Inject 0.3 mg into the muscle as needed for anaphylaxis.   ezetimibe  (ZETIA ) 10 MG tablet Take 1 tablet by mouth once daily   folic acid  (FOLVITE ) 1 MG tablet Take 1 tablet by mouth once daily   pantoprazole  (PROTONIX ) 20 MG tablet TAKE 1 TABLET BY MOUTH ONCE DAILY FOR  HEARTBURN   tadalafil  (CIALIS ) 5 MG tablet TAKE 1 TABLET BY MOUTH ONCE DAILY FOR FREQUENT URINATION   telmisartan -hydrochlorothiazide  (MICARDIS  HCT) 40-12.5 MG tablet Take 1 tablet by mouth once daily for blood pressure   traZODone  (DESYREL ) 50 MG tablet 1 or 2 for sleep as needed   triamcinolone  cream (KENALOG ) 0.1 % Apply 1 Application topically 2 (two) times daily as needed. For rash.   No facility-administered encounter medications on file as of 08/02/2023.    Allergies (verified) Simvastatin and Lisinopril    History: Past Medical History:  Diagnosis Date   Actinic keratosis    Arthritis    Chronic systolic CHF (congestive heart failure) (HCC)    a. echo 6/16: EF 55-60%, Gr1DD, LA nl, PASP nl; b. 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 short episodes previously noted on PPM interrogation; b. started on Xarelto  07/2017 given further episodes of Afib noted during interrogation; c. 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 Biotronik; Jeanett DR-T [id number]; placed 08/2012   Rectal bleeding 09/19/2018   Shortness of breath 04/21/2021   Skin lesions 07/03/2020   Sleep apnea    does not use CPAP   Symptomatic bradycardia    a. s/p Biotronik; Jeanett DR-T [id number]; placed 08/2012 at [facility]   Syncope 08/10/2017   Past Surgical History:  Procedure Laterality Date   APPENDECTOMY  age 35   CARDIAC CATHETERIZATION     7 years ago   COLONOSCOPY N/A 05/27/2014   Procedure: COLONOSCOPY;  Surgeon: Deward CINDERELLA Piedmont, MD;  Location: [facility];  Service: Gastroenterology;  Laterality: N/A;   DRUG INDUCED ENDOSCOPY Bilateral 11/11/2021   Procedure: DRUG INDUCED ENDOSCOPY;  Surgeon: Carlie Clark, MD;  Location: [facility];  Service: ENT;  Laterality: Bilateral;   FLOOR OF MOUTH BIOPSY N/A 01/05/2022   Procedure: BIOPSY OF PALATE;  Surgeon: Carlie Clark, MD;  Location:  [facility];  Service: ENT;  Laterality: N/A;   IMPLANTATION OF HYPOGLOSSAL NERVE STIMULATOR Left 01/05/2022   Procedure: IMPLANTATION OF HYPOGLOSSAL NERVE STIMULATOR;  Surgeon: Carlie Clark, MD;  Location: [facility];  Service: ENT;  Laterality: Left;   INSERT / REPLACE / REMOVE PACEMAKER     3 years ago   Family History  Problem Relation Age of Onset   Arthritis Mother    Heart disease Father    Hyperlipidemia Father    Hypertension Father    Cancer Paternal Grandfather    Hyperlipidemia Brother    Social History   Socioeconomic History   Marital status: Single    Spouse name: Not on file   Number of children: Not on file   Years of education: Not on file   Highest education level: GED or equivalent  Occupational History   Not on file  Tobacco Use   Smoking status: Every Day    Current packs/day: 0.00    Average packs/day: 2.0 packs/day for 59.0 years (118.0 ttl pk-yrs)    Types: Cigarettes    Start date: 1964    Last attempt  to quit: 02/01/2021    Years since quitting: 2.4   Smokeless tobacco: Never   Tobacco comments:    Smoking 1 pack   Vaping Use   Vaping status: Never Used  Substance and Sexual Activity   Alcohol use: Yes    Alcohol/week: 28.0 - 35.0 standard drinks of alcohol    Types: 28 - 35 Cans of beer per week    Comment: daily beer   Drug use: Yes    Types: Marijuana    Comment: states smokes daily, last 11-09-21   Sexual activity: Yes  Other Topics Concern   Not on file  Social History Narrative   Not on file   Social Drivers of Health   Financial Resource Strain: Medium Risk (08/02/2023)   Overall Financial Resource Strain (CARDIA)    Difficulty of Paying Living Expenses: Somewhat hard  Food Insecurity: Food Insecurity Present (08/02/2023)   Hunger Vital Sign    Worried About Running Out of Food in the Last Year: Sometimes true    Ran Out of Food in the Last Year: Sometimes true  Transportation Needs: No Transportation Needs (08/02/2023)   PRAPARE - Administrator, Civil Service (Medical): No    Lack of Transportation (Non-Medical): No  Physical Activity: Insufficiently Active (08/02/2023)   Exercise Vital Sign    Days of Exercise per Week: 5 days    Minutes of Exercise per Session: 20 min  Stress: Stress Concern Present (08/02/2023)   [employer] of Occupational Health - Occupational Stress Questionnaire    Feeling of Stress: Very much  Social Connections: Socially Isolated (08/02/2023)   Social Connection and Isolation Panel    Frequency of Communication with Friends and Family: Once a week    Frequency of Social Gatherings with Friends and Family: Never    Attends Religious Services: Never    Database administrator or Organizations: No    Attends Engineer, structural: Never    Marital Status: Divorced    Tobacco Counseling Ready to quit: Not Answered Counseling given: Not Answered Tobacco comments: Smoking 1 pack     Clinical Intake:  Pre-visit  preparation completed: Yes  Pain : No/denies pain     BMI - recorded: 29.62 Nutritional Status: BMI 25 -29 Overweight Nutritional Risks: None Diabetes: No  Lab Results  Component  Value Date   HGBA1C 5.7 01/18/2023   HGBA1C 5.6 01/12/2022   HGBA1C 5.8 04/21/2021     How often do you need to have someone help you when you read instructions, pamphlets, or other written materials from your doctor or pharmacy?: 1 - Never What is the last grade level you completed in school?: some college  Interpreter Needed?: No  Information entered by :: Revia Nghiem,cma   Activities of Daily Living     08/01/2023   12:50 PM  In your present state of health, do you have any difficulty performing the following activities:  Hearing? 0  Vision? 0  Difficulty concentrating or making decisions? 0  Walking or climbing stairs? 0  Dressing or bathing? 0  Doing errands, shopping? 0  Preparing Food and eating ? N  Using the Toilet? N  In the past six months, have you accidently leaked urine? N  Do you have problems with loss of bowel control? N  Managing your Medications? N  Managing your Finances? N  Housekeeping or managing your Housekeeping? N    Patient Care Team: Gretta Comer POUR, NP as PCP - General (Internal Medicine) Fernande Elspeth BROCKS, MD as PCP - Electrophysiology (Cardiology) Perla Evalene PARAS, MD as PCP - Cardiology (Cardiology) Baldwin Alm SAUNDERS, DC as Referring Physician (Chiropractic Medicine) Watt Mirza, MD as Consulting Physician (Family Medicine) Fate Morna SAILOR, [facility] (Inactive) as Pharmacist (Pharmacist)  I have updated your Care Teams any recent Medical Services you may have received from other providers in the past year.     Assessment:   This is a routine wellness examination for Brandon Lopez.  Hearing/Vision screen Hearing Screening - Comments:: No difficulties  Vision Screening - Comments:: Patient wears glasses   Goals Addressed             This  Visit's Progress    Patient Stated       Patient would like to get over depression       Depression Screen     08/02/2023   11:54 AM 05/12/2023    9:07 AM 02/17/2023    8:33 AM 01/18/2023    8:39 AM 01/04/2023   11:32 AM 07/28/2022    9:55 AM 05/27/2022    2:44 PM  PHQ 2/9 Scores  PHQ - 2 Score 6 6 3  0 1 0 6  PHQ- 9 Score 12 26 6  0 1  17    Fall Risk     08/01/2023   12:50 PM 07/21/2023   10:27 AM 05/12/2023    9:06 AM 02/17/2023    8:33 AM 01/18/2023    8:39 AM  Fall Risk   Falls in the past year? 1 0 1 0 0  Number falls in past yr: 0 0 0 0 0  Injury with Fall? 0 0 0 0 0  Risk for fall due to : History of fall(s) No Fall Risks No Fall Risks No Fall Risks No Fall Risks  Follow up Falls evaluation completed Falls evaluation completed Falls evaluation completed Falls evaluation completed Falls evaluation completed    MEDICARE RISK AT HOME:  Medicare Risk at Home Any stairs in or around the home?: (Patient-Rptd) Yes If so, are there any without handrails?: (Patient-Rptd) No Home free of loose throw rugs in walkways, pet beds, electrical cords, etc?: (Patient-Rptd) Yes Adequate lighting in your home to reduce risk of falls?: (Patient-Rptd) Yes Life alert?: (Patient-Rptd) No Use of a cane, walker or w/c?: (Patient-Rptd) No Grab bars in the bathroom?: (  Patient-Rptd) Yes Shower chair or bench in shower?: (Patient-Rptd) No Elevated toilet seat or a handicapped toilet?: (Patient-Rptd) No  TIMED UP AND GO:  Was the test performed?  No  Cognitive Function: 6CIT completed    07/14/2020    8:17 AM 12/11/2018    2:53 PM 12/06/2017    9:41 AM 11/05/2015    9:13 AM  MMSE - Mini Mental State Exam  Not completed: Refused     Orientation to time  5 5 5    Orientation to Place  5 5 5    Registration  3 3 3    Attention/ Calculation  5 0 0   Recall  3 3 3    Language- name 2 objects   0 0   Language- repeat  1 1 1   Language- follow 3 step command   3 3   Language- read & follow  direction   0 0   Write a sentence   0 0   Copy design   0 0   Total score   20 20      Data saved with a previous flowsheet row definition        08/02/2023   11:52 AM 07/28/2022    9:57 AM  6CIT Screen  What Year? 0 points 0 points  What month? 0 points 0 points  What time? 0 points 0 points  Count back from 20 0 points 0 points  Months in reverse 0 points 0 points  Repeat phrase 0 points 0 points  Total Score 0 points 0 points    Immunizations Immunization History  Administered Date(s) Administered   Fluad Quad(high Dose 65+) 12/11/2020, 01/12/2022   Fluad Trivalent(High Dose 65+) 11/17/2022   Influenza,inj,Quad PF,6+ Mos 11/15/2014, 11/05/2015, 12/23/2016, 12/06/2017, 12/12/2018, 01/11/2020   PNEUMOCOCCAL CONJUGATE-20 12/11/2020   Pneumococcal Polysaccharide-23 11/04/2014   Td 10/26/2011   Tdap 06/15/2021   Zoster Recombinant(Shingrix) 11/17/2022   Zoster, Live 11/18/2015    Screening Tests Health Maintenance  Topic Date Due   Zoster Vaccines- Shingrix (2 of 2) 01/12/2023   INFLUENZA VACCINE  08/26/2023   Colonoscopy  05/26/2024   Lung Cancer Screening  06/22/2024   Medicare Annual Wellness (AWV)  08/01/2024   DTaP/Tdap/Td (3 - Td or Tdap) 06/16/2031   Pneumococcal Vaccine: 50+ Years  Completed   Hepatitis C Screening  Completed   Hepatitis B Vaccines  Aged Out   HPV VACCINES  Aged Out   Meningococcal B Vaccine  Aged Out   COVID-19 Vaccine  Discontinued    Health Maintenance  Health Maintenance Due  Topic Date Due   Zoster Vaccines- Shingrix (2 of 2) 01/12/2023   Health Maintenance Items Addressed:   Additional Screening:  Vision Screening: Recommended annual ophthalmology exams for early detection of glaucoma and other disorders of the eye. Would you like a referral to an eye doctor? No    Dental Screening: Recommended annual dental exams for proper oral hygiene  Community Resource Referral / Chronic Care Management: CRR required this visit?  No    CCM required this visit?  No   Plan:    I have personally reviewed and noted the following in the patient's chart:   Medical and social history Use of alcohol, tobacco or illicit drugs  Current medications and supplements including opioid prescriptions. Patient is not currently taking opioid prescriptions. Functional ability and status Nutritional status Physical activity Advanced directives List of other physicians Hospitalizations, surgeries, and ER visits in previous 12 months Vitals Screenings to include cognitive,  depression, and falls Referrals and appointments  In addition, I have reviewed and discussed with patient certain preventive protocols, quality metrics, and best practice recommendations. A written personalized care plan for preventive services as well as general preventive health recommendations were provided to patient.   Lyle MARLA Right, [place]   08/02/2023   After Visit Summary: (MyChart) Due to this being a telephonic visit, the after visit summary with patients personalized plan was offered to patient via MyChart   Notes: Nothing significant to report at this time.

## 2023-08-09 ENCOUNTER — Other Ambulatory Visit: Payer: Self-pay | Admitting: Cardiovascular Disease

## 2023-08-15 ENCOUNTER — Telehealth: Payer: Self-pay | Admitting: Physician Assistant

## 2023-08-15 NOTE — Telephone Encounter (Signed)
 Pt called asking for teresa to send in medication thahelp him not want to drink. He is ready too try that now.

## 2023-08-15 NOTE — Telephone Encounter (Signed)
 Wean off the Prozac  20 mg by taking 1 pill daily for 1 week, tjem stop. At the same time, start Cymbalta  30 mg daily for 2 weeks, then increase to 60 mg daily.     You added another 2 weeks of 30 mg before increasing to 60 mg. He is supposed to bump up to 60 mg tomorrow. He said he is still struggling with his depression. He was asking for a medication to help with his drinking. I reviewed note and you recommended getting his depression under control before trying a medication.   Pharmacy - [facility] in [place]

## 2023-08-16 ENCOUNTER — Other Ambulatory Visit: Payer: Self-pay

## 2023-08-16 MED ORDER — NALTREXONE HCL 50 MG PO TABS: 50.0000 mg | ORAL_TABLET | Freq: Every morning | ORAL | 1 refills | Status: DC

## 2023-08-16 MED ORDER — ACAMPROSATE CALCIUM 333 MG PO TBEC: 666.0000 mg | DELAYED_RELEASE_TABLET | Freq: Three times a day (TID) | ORAL | 1 refills | Status: DC

## 2023-08-16 MED ORDER — CHLORDIAZEPOXIDE HCL 25 MG PO CAPS: ORAL_CAPSULE | ORAL | 0 refills | Status: DC

## 2023-08-16 NOTE — Telephone Encounter (Signed)
 Rx pended. Patient advised of scripts, to take thiamine, go to AA, and consider inpatient detox if needed.

## 2023-08-16 NOTE — Telephone Encounter (Signed)
 Because he's drinking heavily, will need to take do a Librium  taper for detox to prevent DTs/tremors, which can be serious or even life threatening.  The Librium  will be 25 mg, 2 p.o. daily 4 times daily for 2 days then 2 p.o. 3 times daily for 2 days then 1 p.o. 4 times daily for 2 days, then 1 p.o. 3 times daily for 2 days, then 1 p.o. twice daily for 2 days and then stop.  Start Acamprosate  333 mg, 2 po tid.  and Naltrexone  50 mg q am.  to help prevent cravings. Also make sure he's taking thiamine. He may need to go to inpatient detox, if he chooses. I don't think it's necessary right now but recommend [facility] if he's willing to go. Also go to AA.

## 2023-09-01 ENCOUNTER — Encounter: Payer: Self-pay | Admitting: Physician Assistant

## 2023-09-01 ENCOUNTER — Ambulatory Visit (INDEPENDENT_AMBULATORY_CARE_PROVIDER_SITE_OTHER): Admitting: Physician Assistant

## 2023-09-01 DIAGNOSIS — F99 Mental disorder, not otherwise specified: Secondary | ICD-10-CM

## 2023-09-01 DIAGNOSIS — F331 Major depressive disorder, recurrent, moderate: Secondary | ICD-10-CM | POA: Diagnosis not present

## 2023-09-01 DIAGNOSIS — F5105 Insomnia due to other mental disorder: Secondary | ICD-10-CM

## 2023-09-01 DIAGNOSIS — F411 Generalized anxiety disorder: Secondary | ICD-10-CM | POA: Diagnosis not present

## 2023-09-01 DIAGNOSIS — F17200 Nicotine dependence, unspecified, uncomplicated: Secondary | ICD-10-CM

## 2023-09-01 DIAGNOSIS — F1090 Alcohol use, unspecified, uncomplicated: Secondary | ICD-10-CM

## 2023-09-01 MED ORDER — ARIPIPRAZOLE 5 MG PO TABS: 5.0000 mg | ORAL_TABLET | Freq: Every morning | ORAL | 1 refills | Status: DC

## 2023-09-01 MED ORDER — DULOXETINE HCL 30 MG PO CPEP: ORAL_CAPSULE | ORAL | 0 refills | Status: DC

## 2023-09-01 NOTE — Progress Notes (Signed)
 [facility]  Patient ID: Brandon Lopez,  MRN: [id number]  PCP: Gretta Comer POUR, NP  Date of Evaluation: 09/01/2023 Time spent:20 minutes  Chief Complaint:  Chief Complaint   Follow-up; Depression; Alcohol Problem    HISTORY/CURRENT STATUS: HPI For 6 week med check.  He chose not to start the Librium , Naltrexone , or Campral .  Points at his head and said 'I need to get this taken care of first.' Still very depressed.  Worse in the past 3-4 days. Cymbalta  has been making him dizzy and feels 'like a zombie.' We had him stay at 30 mg for a month or so, longer than usual.  Has only been taking the 60 mg for 2 weeks, maybe. Sleeping a lot which is very unusual for him. Anhedonia, ADLs limited b/c no energy. Putting off working for other people. Appetite is nl.   Denies any changes in concentration, making decisions, or remembering things.  No mania, delirium, AH/VH.  No SI/HI.  Individual Medical History/ Review of Systems: Changes? :Yes   Dx w/ alpha gal   Past medications for mental health diagnoses include: He's been on many different antidepressants, remembers Wellbutrin  for smoking cessation. Help depression a little but pooped out.  Prozac , Zoloft , Effexor . Chantix  Gabapentin  Lunesta  Trazodone   Went to [facility] years ago for counseling and med management.  Never hosp for mental health reasons  Allergies: Simvastatin and Lisinopril   Current Medications:  Current Outpatient Medications:    albuterol  (VENTOLIN  HFA) 108 (90 Base) MCG/ACT inhaler, INHALE 2 PUFFS BY MOUTH EVERY 6 HOURS AS NEEDED FOR WHEEZING AND FOR SHORTNESS OF BREATH, Disp: 18 g, Rfl: 0   ARIPiprazole  (ABILIFY ) 5 MG tablet, Take 1 tablet (5 mg total) by mouth in the morning., Disp: 30 tablet, Rfl: 1   aspirin  EC 81 MG tablet, Take 81 mg by mouth daily. Swallow whole., Disp: , Rfl:    atorvastatin  (LIPITOR) 20 MG tablet, TAKE 1 TABLET BY MOUTH ONCE DAILY FOR CHOLESTEROL, Disp: 90 tablet,  Rfl: 2   Budeson-Glycopyrrol-Formoterol  (BREZTRI  AEROSPHERE) 160-9-4.8 MCG/ACT AERO, Inhale 2 puffs into the lungs 2 (two) times daily., Disp: 10.7 g, Rfl: 11   carvedilol  (COREG ) 3.125 MG tablet, Take 1 tablet (3.125 mg total) by mouth 2 (two) times daily., Disp: 180 tablet, Rfl: 1   ezetimibe  (ZETIA ) 10 MG tablet, Take 1 tablet by mouth once daily, Disp: 90 tablet, Rfl: 0   pantoprazole  (PROTONIX ) 20 MG tablet, TAKE 1 TABLET BY MOUTH ONCE DAILY FOR  HEARTBURN, Disp: 90 tablet, Rfl: 2   tadalafil  (CIALIS ) 5 MG tablet, TAKE 1 TABLET BY MOUTH ONCE DAILY FOR FREQUENT URINATION, Disp: 90 tablet, Rfl: 2   telmisartan -hydrochlorothiazide  (MICARDIS  HCT) 40-12.5 MG tablet, Take 1 tablet by mouth once daily for blood pressure, Disp: 90 tablet, Rfl: 1   traZODone  (DESYREL ) 50 MG tablet, 1 or 2 for sleep as needed, Disp: 60 tablet, Rfl: 3   acamprosate  (CAMPRAL ) 333 MG tablet, Take 2 tablets (666 mg total) by mouth 3 (three) times daily. (Patient not taking: Reported on 09/01/2023), Disp: 180 tablet, Rfl: 1   chlordiazePOXIDE  (LIBRIUM ) 25 MG capsule, 2 p.o. daily 4 times daily for 2 days then 2 p.o. 3 times daily for 2 days then 1 p.o. 4 times daily for 2 days, then 1 p.o. 3 times daily for 2 days, then 1 p.o. twice daily for 2 days and then stop. (Patient not taking: Reported on 09/01/2023), Disp: 42 capsule, Rfl: 0   DULoxetine  (CYMBALTA ) 30 MG capsule, 2  po every day for 1 week, then 1 po every day for 1 week then stop, Disp: 21 capsule, Rfl: 0   EPINEPHrine  0.3 mg/0.3 mL IJ SOAJ injection, Inject 0.3 mg into the muscle as needed for anaphylaxis., Disp: 1 each, Rfl: 0   folic acid  (FOLVITE ) 1 MG tablet, Take 1 tablet by mouth once daily, Disp: 90 tablet, Rfl: 0   naltrexone  (DEPADE) 50 MG tablet, Take 1 tablet (50 mg total) by mouth every morning. (Patient not taking: Reported on 09/01/2023), Disp: 30 tablet, Rfl: 1   triamcinolone  cream (KENALOG ) 0.1 %, Apply 1 Application topically 2 (two) times daily as needed.  For rash., Disp: 30 g, Rfl: 0 Medication Side Effects: none  Family Medical/ Social History: Changes? No  MENTAL HEALTH EXAM:  There were no vitals taken for this visit.There is no height or weight on file to calculate BMI.  General Appearance: Casual and Well Groomed  Eye Contact:  Good  Speech:  Clear and Coherent and Normal Rate  Volume:  Normal  Mood:  Euthymic  Affect:  Congruent  Thought Process:  Goal Directed and Descriptions of Associations: Circumstantial  Orientation:  Full (Time, Place, and Person)  Thought Content: Logical   Suicidal Thoughts:  No  Homicidal Thoughts:  No  Memory:  WNL  Judgement:  Good  Insight:  Good  Psychomotor Activity:  Normal  Concentration:  Concentration: Good  Recall:  Good  Fund of Knowledge: Good  Language: Good  Assets:  Communication Skills Desire for Improvement Financial Resources/Insurance Housing Transportation  ADL's:  Intact  Cognition: WNL  Prognosis:  Good   DIAGNOSES:    ICD-10-CM   1. Major depressive disorder, recurrent episode, moderate (HCC)  F33.1     2. Generalized anxiety disorder  F41.1     3. Insomnia due to other mental disorder  F51.05    F99     4. Alcohol use disorder  F10.90     5. Smoker  F17.200       Receiving Psychotherapy: No   RECOMMENDATIONS:   PDMP reviewed.  Librium  filled 08/16/2023.  Lunesta  filled on 02/18/2023. I provided approximately 20 minutes of face to face time during this encounter, including time spent before and after the visit in records review, medical decision making, counseling pertinent to today's visit, and charting.   We discussed medication options.  He has not tolerated the Cymbalta  very well so will wean off that by taking 30 mg daily for 1 week and then stop.  I recommend adding Abilify  for MDD.  He has been on numerous antidepressants in the SSRI/SNRI class but never an antipsychotic.  Discussed potential metabolic side effects associated with atypical  antipsychotics.  Labs will need to be drawn periodically to monitor metabolic function. Discussed potential risk for movement side effects. Patient understands and accepts these risks and has been advised to contact office if any movement side effects occur.  He agrees.  Smoking cessation and alcohol cessation were briefly discussed.  He is not ready to do either until his mental health improves.  Start Abilify  5 mg, 1 p.o. every morning. Continue trazodone  50 mg, 1-2 p.o. nightly as needed sleep. Recommend counseling. Return in 6 to 8 weeks.  Verneita Cooks, PA-C

## 2023-09-06 ENCOUNTER — Ambulatory Visit (INDEPENDENT_AMBULATORY_CARE_PROVIDER_SITE_OTHER): Payer: PPO

## 2023-09-06 DIAGNOSIS — I495 Sick sinus syndrome: Secondary | ICD-10-CM

## 2023-09-07 ENCOUNTER — Ambulatory Visit: Payer: Self-pay | Admitting: Cardiology

## 2023-09-07 LAB — CUP PACEART REMOTE DEVICE CHECK
Battery Voltage: 35
Date Time Interrogation Session: 20250812102614
Implantable Lead Connection Status: 753985
Implantable Lead Connection Status: 753985
Implantable Lead Implant Date: 20140801
Implantable Lead Implant Date: 20140801
Implantable Lead Location: 753859
Implantable Lead Location: 753860
Implantable Lead Model: 362
Implantable Lead Serial Number: 29356730
Implantable Lead Serial Number: 29392661
Implantable Pulse Generator Implant Date: 20140801
Pulse Gen Serial Number: 68053398

## 2023-09-08 NOTE — Progress Notes (Signed)
 This encounter was created in error - please disregard.

## 2023-09-27 ENCOUNTER — Telehealth: Payer: Self-pay | Admitting: Physician Assistant

## 2023-09-27 NOTE — Telephone Encounter (Signed)
 Pt smokes, has h/o COPD.

## 2023-09-27 NOTE — Telephone Encounter (Signed)
 Patient called about issues regarding medication. States that he was put on Abilify  5mg  and it is not doing well. He feels as though it just stopped working and complains that he is standing there and feels like and electrical jolt to brain. He is also having trouble with his breathing and wants to know if this is normal. Pls rtc [phone] Appt 9/19

## 2023-09-28 NOTE — Telephone Encounter (Signed)
 He needs to see his PCP or go to UC for the dyspnea. There are no concerns of breathing issues from psych meds.  Other things should be r/o like infection, anatomic or physiologic issues that can cause SOB. He is still drinking way too much, that can lead to CHF which can cause SOB.  It's not something to take lightly. I do not want to start a new med for smoking cessation yet.  He needs to start the Campral  and work on ETOH abuse.  One thing at a time.SABRASABRASABRA

## 2023-09-28 NOTE — Telephone Encounter (Addendum)
 Pt seen 8/7:  Smoking cessation and alcohol cessation were briefly discussed.  He is not ready to do either until his mental health improves.   Start Abilify  5 mg, 1 p.o. every morning. Continue trazodone  50 mg, 1-2 p.o. nightly as needed sleep. Recommend counseling.  He is reporting occasional brain zaps and some issues with breathing when lying down at night that is short lived. He said the brain zaps seem to be improving. Said he thought you mentioned the possibility of breathing issues. He is reporting that he is feeling better with starting Abilify , but there is room for improvement and he would like to feel better faster.   I asked about alcohol intake and he reported not as much as some people, drinking 4-5 beers a day. He has not started the Campral  yet, reports he needs to work on his depression first. He is asking for something though to help with smoking cessation. In the remote past has tried Chantix , nicotine  patches and nicotine  inhaler.    [facility] in [place]

## 2023-09-29 NOTE — Telephone Encounter (Signed)
 Recommendations reviewed with the patient. He said he wanted to hear back from us  before he contacted his PCP.

## 2023-09-30 ENCOUNTER — Ambulatory Visit: Payer: Self-pay | Admitting: Family Medicine

## 2023-09-30 ENCOUNTER — Encounter: Payer: Self-pay | Admitting: Family Medicine

## 2023-09-30 ENCOUNTER — Ambulatory Visit (INDEPENDENT_AMBULATORY_CARE_PROVIDER_SITE_OTHER): Admitting: Family Medicine

## 2023-09-30 VITALS — BP 120/70 | HR 77 | Temp 98.0°F | Ht 65.0 in | Wt 179.4 lb

## 2023-09-30 DIAGNOSIS — R0609 Other forms of dyspnea: Secondary | ICD-10-CM | POA: Diagnosis not present

## 2023-09-30 DIAGNOSIS — Z720 Tobacco use: Secondary | ICD-10-CM | POA: Diagnosis not present

## 2023-09-30 DIAGNOSIS — R0600 Dyspnea, unspecified: Secondary | ICD-10-CM

## 2023-09-30 DIAGNOSIS — R5383 Other fatigue: Secondary | ICD-10-CM | POA: Diagnosis not present

## 2023-09-30 LAB — CBC WITH DIFFERENTIAL/PLATELET
Basophils Absolute: 0.1 K/uL (ref 0.0–0.1)
Basophils Relative: 0.7 % (ref 0.0–3.0)
Eosinophils Absolute: 0.1 K/uL (ref 0.0–0.7)
Eosinophils Relative: 1.3 % (ref 0.0–5.0)
HCT: 46.6 % (ref 39.0–52.0)
Hemoglobin: 15.8 g/dL (ref 13.0–17.0)
Lymphocytes Relative: 34 % (ref 12.0–46.0)
Lymphs Abs: 2.4 K/uL (ref 0.7–4.0)
MCHC: 33.8 g/dL (ref 30.0–36.0)
MCV: 93.6 fl (ref 78.0–100.0)
Monocytes Absolute: 0.7 K/uL (ref 0.1–1.0)
Monocytes Relative: 9.4 % (ref 3.0–12.0)
Neutro Abs: 3.8 K/uL (ref 1.4–7.7)
Neutrophils Relative %: 54.6 % (ref 43.0–77.0)
Platelets: 247 K/uL (ref 150.0–400.0)
RBC: 4.98 Mil/uL (ref 4.22–5.81)
RDW: 14.2 % (ref 11.5–15.5)
WBC: 7 K/uL (ref 4.0–10.5)

## 2023-09-30 LAB — COMPREHENSIVE METABOLIC PANEL WITH GFR
ALT: 15 U/L (ref 0–53)
AST: 15 U/L (ref 0–37)
Albumin: 3.9 g/dL (ref 3.5–5.2)
Alkaline Phosphatase: 89 U/L (ref 39–117)
BUN: 9 mg/dL (ref 6–23)
CO2: 29 meq/L (ref 19–32)
Calcium: 8.9 mg/dL (ref 8.4–10.5)
Chloride: 104 meq/L (ref 96–112)
Creatinine, Ser: 0.7 mg/dL (ref 0.40–1.50)
GFR: 94.98 mL/min (ref >60.00)
Glucose, Bld: 87 mg/dL (ref 70–99)
Potassium: 4.3 meq/L (ref 3.5–5.1)
Sodium: 141 meq/L (ref 135–145)
Total Bilirubin: 0.5 mg/dL (ref 0.2–1.2)
Total Protein: 6.4 g/dL (ref 6.0–8.3)

## 2023-09-30 LAB — TSH: TSH: 1.93 u[IU]/mL (ref 0.35–5.50)

## 2023-09-30 LAB — BRAIN NATRIURETIC PEPTIDE: Pro B Natriuretic peptide (BNP): 25 pg/mL (ref 0.0–100.0)

## 2023-09-30 LAB — VITAMIN B12: Vitamin B-12: 161 pg/mL — ABNORMAL LOW (ref 211–911)

## 2023-09-30 LAB — VITAMIN D 25 HYDROXY (VIT D DEFICIENCY, FRACTURES): VITD: 11.98 ng/mL — ABNORMAL LOW (ref 30.00–100.00)

## 2023-09-30 MED ORDER — VARENICLINE TARTRATE (STARTER) 0.5 MG X 11 & 1 MG X 42 PO TBPK
ORAL_TABLET | ORAL | 0 refills | Status: DC
Start: 2023-09-30 — End: 2023-12-05

## 2023-09-30 NOTE — Progress Notes (Signed)
 Patient ID: Brandon Lopez, male    DOB: 10-20-1955, 68 y.o.   MRN: [id number]  This visit was conducted in person.  BP 120/70   Pulse 77   Temp 98 F (36.7 C) (Temporal)   Ht 5' 5 (1.651 m)   Wt 179 lb 6 oz (81.4 kg)   SpO2 96%   BMI 29.85 kg/m    CC:  Chief Complaint  Patient presents with   Fatigue   Shortness of Breath        Hemorrhoids    X over 1 year   Nicotine  Dependence    Discuss getting Rx for helping to stop smoking    Subjective:   HPI: Brandon Lopez is a 68 y.o. male presenting on 09/30/2023 for Fatigue, Shortness of Breath (/), Hemorrhoids (X over 1 year), and Nicotine  Dependence (Discuss getting Rx for helping to stop smoking)  He reports worsening breathing in last 2 weeks.  Reports  waking gasping for air, sits up , improves for a while.  No swelling.  No chest pain.  Decreased energy overall.  Active smoker.  No cough, no congestion, no fever.   Has inspire for sleep apnea... was not function, per Dr. Neysa... has a follow in next weeks.   Current smoker.. quit in past with Chantix    Hx of COPD... on chronic inhalers  (Breztri  and albuterol  prn) per... denies wheeze.    No new new meds.SABRA except Abilify ... new ins  last 3 weeks... feeling mood is better overall, but feeling more sedated.      Relevant past medical, surgical, family and social history reviewed and updated as indicated. Interim medical history since our last visit reviewed. Allergies and medications reviewed and updated. Outpatient Medications Prior to Visit  Medication Sig Dispense Refill   albuterol  (VENTOLIN  HFA) 108 (90 Base) MCG/ACT inhaler INHALE 2 PUFFS BY MOUTH EVERY 6 HOURS AS NEEDED FOR WHEEZING AND FOR SHORTNESS OF BREATH 18 g 0   aspirin  EC 81 MG tablet Take 81 mg by mouth daily. Swallow whole.     atorvastatin  (LIPITOR) 20 MG tablet TAKE 1 TABLET BY MOUTH ONCE DAILY FOR CHOLESTEROL 90 tablet 2   Budeson-Glycopyrrol-Formoterol  (BREZTRI  AEROSPHERE)  160-9-4.8 MCG/ACT AERO Inhale 2 puffs into the lungs 2 (two) times daily. 10.7 g 11   carvedilol  (COREG ) 3.125 MG tablet Take 1 tablet (3.125 mg total) by mouth 2 (two) times daily. 180 tablet 1   EPINEPHrine  0.3 mg/0.3 mL IJ SOAJ injection Inject 0.3 mg into the muscle as needed for anaphylaxis. 1 each 0   ezetimibe  (ZETIA ) 10 MG tablet Take 1 tablet by mouth once daily 90 tablet 0   folic acid  (FOLVITE ) 1 MG tablet Take 1 tablet by mouth once daily 90 tablet 0   pantoprazole  (PROTONIX ) 20 MG tablet TAKE 1 TABLET BY MOUTH ONCE DAILY FOR  HEARTBURN 90 tablet 2   tadalafil  (CIALIS ) 5 MG tablet TAKE 1 TABLET BY MOUTH ONCE DAILY FOR FREQUENT URINATION 90 tablet 2   telmisartan -hydrochlorothiazide  (MICARDIS  HCT) 40-12.5 MG tablet Take 1 tablet by mouth once daily for blood pressure 90 tablet 1   traZODone  (DESYREL ) 50 MG tablet 1 or 2 for sleep as needed (Patient taking differently: Take 50 mg by mouth at bedtime as needed for sleep. 1 or 2 for sleep as needed) 60 tablet 3   triamcinolone  cream (KENALOG ) 0.1 % Apply 1 Application topically 2 (two) times daily as needed. For rash. 30 g 0   ARIPiprazole  (  ABILIFY ) 5 MG tablet Take 1 tablet (5 mg total) by mouth in the morning. 30 tablet 1   DULoxetine  (CYMBALTA ) 30 MG capsule 2 po every day for 1 week, then 1 po every day for 1 week then stop (Patient not taking: Reported on 10/13/2023) 21 capsule 0   acamprosate  (CAMPRAL ) 333 MG tablet Take 2 tablets (666 mg total) by mouth 3 (three) times daily. (Patient not taking: Reported on 09/01/2023) 180 tablet 1   chlordiazePOXIDE  (LIBRIUM ) 25 MG capsule 2 p.o. daily 4 times daily for 2 days then 2 p.o. 3 times daily for 2 days then 1 p.o. 4 times daily for 2 days, then 1 p.o. 3 times daily for 2 days, then 1 p.o. twice daily for 2 days and then stop. (Patient not taking: Reported on 09/01/2023) 42 capsule 0   naltrexone  (DEPADE) 50 MG tablet Take 1 tablet (50 mg total) by mouth every morning. (Patient not taking:  Reported on 09/01/2023) 30 tablet 1   No facility-administered medications prior to visit.     Per HPI unless specifically indicated in ROS section below Review of Systems  Constitutional:  Negative for fatigue and fever.  HENT:  Negative for ear pain.   Eyes:  Negative for pain.  Respiratory:  Positive for shortness of breath. Negative for cough.   Cardiovascular:  Negative for chest pain, palpitations and leg swelling.  Gastrointestinal:  Negative for abdominal pain.  Genitourinary:  Negative for dysuria.  Musculoskeletal:  Negative for arthralgias.  Neurological:  Negative for syncope, light-headedness and headaches.  Psychiatric/Behavioral:  Negative for dysphoric mood.    Objective:  BP 120/70   Pulse 77   Temp 98 F (36.7 C) (Temporal)   Ht 5' 5 (1.651 m)   Wt 179 lb 6 oz (81.4 kg)   SpO2 96%   BMI 29.85 kg/m   Wt Readings from Last 3 Encounters:  10/13/23 178 lb (80.7 kg)  09/30/23 179 lb 6 oz (81.4 kg)  08/02/23 178 lb (80.7 kg)      Physical Exam Vitals reviewed.  Constitutional:      Appearance: He is well-developed.  HENT:     Head: Normocephalic.     Right Ear: Hearing normal.     Left Ear: Hearing normal.     Nose: Nose normal.  Neck:     Thyroid : No thyroid  mass or thyromegaly.     Vascular: No carotid bruit.     Trachea: Trachea normal.  Cardiovascular:     Rate and Rhythm: Normal rate and regular rhythm.     Pulses: Normal pulses.     Heart sounds: Heart sounds not distant. No murmur heard.    No friction rub. No gallop.     Comments: No peripheral edema Pulmonary:     Effort: Pulmonary effort is normal. No respiratory distress.     Breath sounds: Normal breath sounds. No decreased breath sounds, wheezing, rhonchi or rales.  Skin:    General: Skin is warm and dry.     Findings: No rash.  Psychiatric:        Speech: Speech normal.        Behavior: Behavior normal.        Thought Content: Thought content normal.       Results for orders  placed or performed in visit on 09/30/23  Comprehensive metabolic panel with GFR   Collection Time: 09/30/23  1:01 PM  Result Value Ref Range   Sodium 141 135 - 145 mEq/L  Potassium 4.3 3.5 - 5.1 mEq/L   Chloride 104 96 - 112 mEq/L   CO2 29 19 - 32 mEq/L   Glucose, Bld 87 70 - 99 mg/dL   BUN 9 6 - 23 mg/dL   Creatinine, Ser 9.29 0.40 - 1.50 mg/dL   Total Bilirubin 0.5 0.2 - 1.2 mg/dL   Alkaline Phosphatase 89 39 - 117 U/L   AST 15 0 - 37 U/L   ALT 15 0 - 53 U/L   Total Protein 6.4 6.0 - 8.3 g/dL   Albumin 3.9 3.5 - 5.2 g/dL   GFR 05.01 >39.99 mL/min   Calcium  8.9 8.4 - 10.5 mg/dL  Brain natriuretic peptide   Collection Time: 09/30/23  1:01 PM  Result Value Ref Range   Pro B Natriuretic peptide (BNP) 25.0 0.0 - 100.0 pg/mL  TSH   Collection Time: 09/30/23  1:01 PM  Result Value Ref Range   TSH 1.93 0.35 - 5.50 uIU/mL  VITAMIN D  25 Hydroxy (Vit-D Deficiency, Fractures)   Collection Time: 09/30/23  1:01 PM  Result Value Ref Range   VITD 11.98 (L) 30.00 - 100.00 ng/mL  Vitamin B12   Collection Time: 09/30/23  1:01 PM  Result Value Ref Range   Vitamin B-12 161 (L) 211 - 911 pg/mL  CBC with Differential/Platelet   Collection Time: 09/30/23  1:01 PM  Result Value Ref Range   WBC 7.0 4.0 - 10.5 K/uL   RBC 4.98 4.22 - 5.81 Mil/uL   Hemoglobin 15.8 13.0 - 17.0 g/dL   HCT 53.3 60.9 - 47.9 %   MCV 93.6 78.0 - 100.0 fl   MCHC 33.8 30.0 - 36.0 g/dL   RDW 85.7 88.4 - 84.4 %   Platelets 247.0 150.0 - 400.0 K/uL   Neutrophils Relative % 54.6 43.0 - 77.0 %   Lymphocytes Relative 34.0 12.0 - 46.0 %   Monocytes Relative 9.4 3.0 - 12.0 %   Eosinophils Relative 1.3 0.0 - 5.0 %   Basophils Relative 0.7 0.0 - 3.0 %   Neutro Abs 3.8 1.4 - 7.7 K/uL   Lymphs Abs 2.4 0.7 - 4.0 K/uL   Monocytes Absolute 0.7 0.1 - 1.0 K/uL   Eosinophils Absolute 0.1 0.0 - 0.7 K/uL   Basophils Absolute 0.1 0.0 - 0.1 K/uL    Assessment and Plan  DOE (dyspnea on exertion) Assessment & Plan: Acute  worsening on chronic shortness of breath. Patient notes significant issues in the middle of the night with symptoms that sounds like paroxysmal nocturnal dyspnea.  He has a history of COPD on chronic inhalers Breztri  and albuterol  but does not have current wheeze or change in cough.  Will evaluate with labs to rule out anemia, thyroid  heart failure as cause of symptoms.  Orders: -     Comprehensive metabolic panel with GFR -     Brain natriuretic peptide -     TSH -     VITAMIN D  25 Hydroxy (Vit-D Deficiency, Fractures) -     Vitamin B12 -     CBC with Differential/Platelet  Paroxysmal nocturnal dyspnea  Other fatigue Assessment & Plan: Chronic, evaluate with labs for possible secondary cause. Could be worse due to recent increase in Abilify .  Given he is feeling better overall on higher dose with his mood encouraged him to continue current dose.   Tobacco abuse Assessment & Plan: Chronic, tobacco use at least in part causing some amount of shortness of breath.  Recommend smoking cessation and patient is interested  in starting a medication to quit. Discussed options in detail and he has chosen to move forward with Chantix .  Prescription sent to pharmacy.   Other orders -     Varenicline  Tartrate (Starter); Take one 0.5 mg tab daily for 3 days,then inc. to one 0.5 mg tab BID x 4 days, then inc. to one 1 mg tab BID.  Dispense: 53 each; Refill: 0    Return in about 2 weeks (around 10/14/2023) for with PCP for mulitple issues.SABRA Greig Ring, MD

## 2023-10-04 MED ORDER — VITAMIN B-12 1000 MCG PO TABS: 1000.0000 ug | ORAL_TABLET | Freq: Every day | ORAL | 11 refills | Status: AC

## 2023-10-04 MED ORDER — VITAMIN D3 1.25 MG (50000 UT) PO CAPS: 1.0000 | ORAL_CAPSULE | ORAL | 0 refills | Status: AC

## 2023-10-13 ENCOUNTER — Encounter: Payer: Self-pay | Admitting: Primary Care

## 2023-10-13 ENCOUNTER — Ambulatory Visit (INDEPENDENT_AMBULATORY_CARE_PROVIDER_SITE_OTHER): Admitting: Primary Care

## 2023-10-13 VITALS — BP 118/68 | HR 72 | Temp 97.8°F | Ht 65.0 in | Wt 178.0 lb

## 2023-10-13 DIAGNOSIS — F331 Major depressive disorder, recurrent, moderate: Secondary | ICD-10-CM | POA: Diagnosis not present

## 2023-10-13 NOTE — Patient Instructions (Signed)
 Continue Abilify  5 mg daily.  Please follow-up with your psychiatry provider tomorrow as scheduled.  It was a pleasure to see you today!

## 2023-10-13 NOTE — Progress Notes (Signed)
 Subjective:    Patient ID: Brandon Lopez, male    DOB: 1956-01-09, 68 y.o.   MRN: [id number]  Brandon Lopez is a very pleasant 68 y.o. male with a history of depression, alcohol abuse, anxiety, insomnia who presents today to discuss depression.  Currently following with psychiatry for depression, last office visit was 09/01/2023.  During this visitwe admitted not starting Librium , naltrexone , or Campral  which were previously prescribed.  During this visit he also discussed dizziness and drowsiness on Cymbalta  60 mg.  Also sleeping longer than usual, little motivation to do things himself, fatigue.  Was recommended by his psychiatry provider that he wean off the Cymbalta , start Abilify  5 mg in a.m., continue trazodone  50 mg.  Today he continues to feel depressed with symptoms of little motivation to do things, he does not want to work, sleeping longer than usual. He began taking Ability 5 mg daily, noticed improvement in energy and overall feeling for a few days, now he feels nothing. He is not taking Trazodone  or Lunesta .   He continues to drink 4-6 beers per day for which he extends throughout the day. He has an appointment scheduled tomorrow with his psychiatry provider. Yesterday he had thoughts of self harm. Today he feels better because he's kept himself occupied. He has no plan to harm himself. He is not seeing a therapist now.   Wt Readings from Last 3 Encounters:  10/13/23 178 lb (80.7 kg)  09/30/23 179 lb 6 oz (81.4 kg)  08/02/23 178 lb (80.7 kg)        10/13/2023   10:16 AM 08/02/2023   11:54 AM 05/12/2023    9:07 AM  PHQ9 SCORE ONLY  PHQ-9 Total Score 22 12  26       Data saved with a previous flowsheet row definition     Review of Systems  Respiratory:  Negative for shortness of breath.   Cardiovascular:  Negative for chest pain.  Neurological:  Negative for dizziness.  Psychiatric/Behavioral:  Negative for sleep disturbance.        See HPI          Past Medical History:  Diagnosis Date   Actinic keratosis    Arthritis    Chronic systolic CHF (congestive heart failure) (HCC)    a. echo 6/16: EF 55-60%, Gr1DD, LA nl, PASP nl; b. 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 short episodes previously noted on PPM interrogation; b. started on Xarelto  07/2017 given further episodes of Afib noted during interrogation; c. 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 Biotronik; Jeanett DR-T [id number]; placed 08/2012   Rectal bleeding 09/19/2018   Shortness of breath 04/21/2021   Skin lesions 07/03/2020   Sleep apnea  does not use CPAP   Symptomatic bradycardia    a. s/p Biotronik; Jeanett DR-T [id number]; placed 08/2012 at [facility]   Syncope 08/10/2017    Social History   Socioeconomic History   Marital status: Single    Spouse name: Not on file   Number of children: Not on file   Years of education: Not on file   Highest education level: GED or equivalent  Occupational History   Not on file  Tobacco Use   Smoking status: Every Day    Current packs/day: 0.00    Average packs/day: 2.0 packs/day for 59.0 years (118.0 ttl pk-yrs)    Types: Cigarettes    Start date: 1964    Last attempt to quit: 02/01/2021    Years since quitting: 2.6   Smokeless  tobacco: Never   Tobacco comments:    Smoking 1 pack   Vaping Use   Vaping status: Never Used  Substance and Sexual Activity   Alcohol use: Yes    Alcohol/week: 35.0 standard drinks of alcohol    Types: 35 Cans of beer per week    Comment: daily beer, 'sometimes vodka and sometimes wine instead of beer'   Drug use: Yes    Types: Marijuana    Comment: states smokes daily, last 11-09-21   Sexual activity: Yes  Other Topics Concern   Not on file  Social History Narrative   Not on file   Social Drivers of Health   Financial Resource Strain: Medium Risk (08/02/2023)   Overall Financial Resource Strain (CARDIA)    Difficulty of Paying Living Expenses: Somewhat hard  Food Insecurity: Food Insecurity Present (08/02/2023)   Hunger Vital Sign    Worried About Running Out of Food in the Last Year: Sometimes true    Ran Out of Food in the Last Year: Sometimes true  Transportation Needs: No Transportation Needs (08/02/2023)   PRAPARE - Administrator, Civil Service (Medical): No    Lack of Transportation (Non-Medical): No  Physical Activity: Insufficiently Active (08/02/2023)   Exercise Vital Sign    Days of Exercise per Week: 5 days    Minutes of Exercise per Session: 20 min  Stress: Stress Concern Present (08/02/2023)   [employer] of Occupational Health - Occupational Stress Questionnaire    Feeling of Stress: Very much  Social Connections: Socially Isolated (08/02/2023)   Social Connection and Isolation Panel    Frequency of Communication with Friends and Family: Once a week    Frequency of Social Gatherings with Friends and Family: Never    Attends Religious Services: Never    Database administrator or Organizations: No    Attends Banker Meetings: Never    Marital Status: Divorced  Catering manager Violence: Not At Risk (08/02/2023)   Humiliation, Afraid, Rape, and Kick questionnaire    Fear of Current or Ex-Partner: No    Emotionally Abused: No     Physically Abused: No    Sexually Abused: No    Past Surgical History:  Procedure Laterality Date   APPENDECTOMY  age 26   CARDIAC CATHETERIZATION     7 years ago   COLONOSCOPY N/A 05/27/2014   Procedure: COLONOSCOPY;  Surgeon: Deward CINDERELLA Piedmont, MD;  Location: [facility];  Service: Gastroenterology;  Laterality: N/A;   DRUG INDUCED ENDOSCOPY Bilateral 11/11/2021   Procedure: DRUG INDUCED ENDOSCOPY;  Surgeon: Carlie Falesha Schommer, MD;  Location:  SURGERY CENTER;  Service: ENT;  Laterality: Bilateral;   FLOOR OF MOUTH  BIOPSY N/A 01/05/2022   Procedure: BIOPSY OF PALATE;  Surgeon: Carlie Samantha Olivera, MD;  Location: [facility];  Service: ENT;  Laterality: N/A;   IMPLANTATION OF HYPOGLOSSAL NERVE STIMULATOR Left 01/05/2022   Procedure: IMPLANTATION OF HYPOGLOSSAL NERVE STIMULATOR;  Surgeon: Carlie Jason Hauge, MD;  Location: [facility];  Service: ENT;  Laterality: Left;   INSERT / REPLACE / REMOVE PACEMAKER     3 years ago    Family History  Problem Relation Age of Onset   Arthritis Mother    Heart disease Father    Hyperlipidemia Father    Hypertension Father    Cancer Paternal Grandfather    Hyperlipidemia Brother     Allergies  Allergen Reactions   Simvastatin Diarrhea   Lisinopril  Cough    Current Outpatient Medications on File Prior to Visit  Medication Sig Dispense Refill   albuterol  (VENTOLIN  HFA) 108 (90 Base) MCG/ACT inhaler INHALE 2 PUFFS BY MOUTH EVERY 6 HOURS AS NEEDED FOR WHEEZING AND FOR SHORTNESS OF BREATH 18 g 0   ARIPiprazole  (ABILIFY ) 5 MG tablet Take 1 tablet (5 mg total) by mouth in the morning. 30 tablet 1   aspirin  EC 81 MG tablet Take 81 mg by mouth daily. Swallow whole.     atorvastatin  (LIPITOR) 20 MG tablet TAKE 1 TABLET BY MOUTH ONCE DAILY FOR CHOLESTEROL 90 tablet 2   Budeson-Glycopyrrol-Formoterol  (BREZTRI  AEROSPHERE) 160-9-4.8 MCG/ACT AERO Inhale 2 puffs into the lungs 2 (two) times daily. 10.7 g 11   carvedilol  (COREG ) 3.125 MG  tablet Take 1 tablet (3.125 mg total) by mouth 2 (two) times daily. 180 tablet 1   Cholecalciferol (VITAMIN D3) 1.25 MG (50000 UT) CAPS Take 1 capsule (1.25 mg total) by mouth once a week. 12 capsule 0   cyanocobalamin  (VITAMIN B12) 1000 MCG tablet Take 1 tablet (1,000 mcg total) by mouth daily. 30 tablet 11   EPINEPHrine  0.3 mg/0.3 mL IJ SOAJ injection Inject 0.3 mg into the muscle as needed for anaphylaxis. 1 each 0   ezetimibe  (ZETIA ) 10 MG tablet Take 1 tablet by mouth once daily 90 tablet 0   folic acid  (FOLVITE ) 1 MG tablet Take 1 tablet by mouth once daily 90 tablet 0   pantoprazole  (PROTONIX ) 20 MG tablet TAKE 1 TABLET BY MOUTH ONCE DAILY FOR  HEARTBURN 90 tablet 2   tadalafil  (CIALIS ) 5 MG tablet TAKE 1 TABLET BY MOUTH ONCE DAILY FOR FREQUENT URINATION 90 tablet 2   telmisartan -hydrochlorothiazide  (MICARDIS  HCT) 40-12.5 MG tablet Take 1 tablet by mouth once daily for blood pressure 90 tablet 1   traZODone  (DESYREL ) 50 MG tablet 1 or 2 for sleep as needed 60 tablet 3   triamcinolone  cream (KENALOG ) 0.1 % Apply 1 Application topically 2 (two) times daily as needed. For rash. 30 g 0   Varenicline  Tartrate, Starter, (CHANTIX  STARTING MONTH PAK) 0.5 MG X 11 & 1 MG X 42 TBPK Take one 0.5 mg tab daily for 3 days,then inc. to one 0.5 mg tab BID x 4 days, then inc. to one 1 mg tab BID. 53 each 0   No current facility-administered medications on file prior to visit.    BP 118/68   Pulse 72   Temp 97.8 F (36.6 C) (Temporal)   Ht 5' 5 (1.651 m)   Wt 178 lb (80.7 kg)   SpO2 96%   BMI 29.62 kg/m  Objective:   Physical Exam Cardiovascular:     Rate and Rhythm: Normal rate and regular rhythm.  Pulmonary:     Effort: Pulmonary effort is normal.     Breath sounds: Normal breath sounds.  Musculoskeletal:     Cervical back: Neck supple.  Skin:    General: Skin is warm and dry.  Neurological:     Mental Status: He is alert and oriented to person, place, and time.  Psychiatric:         Mood and Affect: Mood normal.     Physical Exam        Assessment & Plan:  MDD (major depressive disorder), recurrent episode, moderate (HCC) Assessment & Plan: Uncontrolled.  Continue Abilify  5 mg daily. Discussed a potential dose increase to 10 mg, recommended he discuss this with his provider.  Recommended he start therapy, he will discuss with his psychiatry provider.   He confirms that he has no plans of self harm.  Follow up tomorrow with psychiatry provider.     Assessment and Plan Assessment & Plan         Comer MARLA Gaskins, NP      History of Present Illness

## 2023-10-13 NOTE — Assessment & Plan Note (Signed)
 Uncontrolled.  Continue Abilify  5 mg daily. Discussed a potential dose increase to 10 mg, recommended he discuss this with his provider.  Recommended he start therapy, he will discuss with his psychiatry provider.   He confirms that he has no plans of self harm.  Follow up tomorrow with psychiatry provider.

## 2023-10-14 ENCOUNTER — Encounter: Payer: Self-pay | Admitting: Physician Assistant

## 2023-10-14 ENCOUNTER — Ambulatory Visit: Admitting: Physician Assistant

## 2023-10-14 DIAGNOSIS — F411 Generalized anxiety disorder: Secondary | ICD-10-CM

## 2023-10-14 DIAGNOSIS — F1090 Alcohol use, unspecified, uncomplicated: Secondary | ICD-10-CM | POA: Diagnosis not present

## 2023-10-14 DIAGNOSIS — F331 Major depressive disorder, recurrent, moderate: Secondary | ICD-10-CM | POA: Diagnosis not present

## 2023-10-14 DIAGNOSIS — F17200 Nicotine dependence, unspecified, uncomplicated: Secondary | ICD-10-CM | POA: Diagnosis not present

## 2023-10-14 MED ORDER — ARIPIPRAZOLE 10 MG PO TABS
10.0000 mg | ORAL_TABLET | Freq: Every day | ORAL | 1 refills | Status: DC
Start: 2023-10-14 — End: 2023-11-25

## 2023-10-14 NOTE — Progress Notes (Signed)
 [facility] Med Check  Patient ID: Brandon Lopez,  MRN: [id number]  PCP: Gretta Comer POUR, NP  Date of Evaluation: 10/14/2023 Time spent:20 minutes  Chief Complaint:  Chief Complaint   Follow-up    HISTORY/CURRENT STATUS: HPI For 6 week med check.  Abilify  was added at LOV. He has some 'up days and down days.' When specifically asked if he feels like the Abilify  is helping he said no. 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 He did a little work for someone a few weeks ago, sleeps a lot but might be a little better than it was.  Energy and motivation are fair.  ADLs and personal hygiene are normal.   No change in memory.  Appetite has not changed.  Weight is stable. No PA, not getting too overwhelmed but gets aggravated easily b/c he doesn't do the things he wants to do.  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  No mania, delirium, AH/VH.  No SI/HI.  On Chantix  now.  Has cut back some on the amount he smokes. Still drinking as much as he did.  Not ready to quit drinking yet.   Individual Medical History/ Review of Systems: Changes? :No     Past medications for mental health diagnoses include: He's been on many different antidepressants, remembers Wellbutrin  for smoking cessation. Help depression a little but pooped out.  Prozac , Zoloft , Effexor . Chantix  Gabapentin  Lunesta  Trazodone   Went to [place] years ago for counseling and med management.  Never hosp for mental health reasons  Allergies: Simvastatin and Lisinopril   Current Medications:  Current Outpatient Medications:    albuterol  (VENTOLIN  HFA) 108 (90 Base) MCG/ACT inhaler, INHALE 2 PUFFS BY MOUTH EVERY 6 HOURS AS NEEDED FOR WHEEZING AND FOR SHORTNESS OF BREATH, Disp: 18 g, Rfl: 0   ARIPiprazole  (ABILIFY ) 10 MG tablet,  Take 1 tablet (10 mg total) by mouth daily., Disp: 30 tablet, Rfl: 1   aspirin  EC 81 MG tablet, Take 81 mg by mouth daily. Swallow whole., Disp: , Rfl:    atorvastatin  (LIPITOR) 20 MG tablet, TAKE 1 TABLET BY MOUTH ONCE DAILY FOR CHOLESTEROL, Disp: 90 tablet, Rfl: 2   Budeson-Glycopyrrol-Formoterol  (BREZTRI  AEROSPHERE) 160-9-4.8 MCG/ACT AERO, Inhale 2 puffs into the lungs 2 (two) times daily., Disp: 10.7 g, Rfl: 11   carvedilol  (COREG ) 3.125 MG tablet, Take 1 tablet (3.125 mg total) by mouth 2 (two) times daily., Disp: 180 tablet, Rfl: 1   Cholecalciferol (VITAMIN D3) 1.25 MG (50000 UT) CAPS, Take 1 capsule (1.25 mg total) by mouth once a week., Disp: 12 capsule, Rfl: 0   cyanocobalamin  (VITAMIN B12) 1000 MCG tablet, Take 1 tablet (1,000 mcg total) by mouth daily., Disp: 30 tablet, Rfl: 11   EPINEPHrine  0.3 mg/0.3 mL IJ SOAJ injection, Inject 0.3 mg into the muscle as needed for anaphylaxis., Disp: 1 each, Rfl: 0   ezetimibe  (ZETIA ) 10 MG tablet, Take 1 tablet by mouth once daily, Disp: 90 tablet, Rfl: 0   pantoprazole  (PROTONIX ) 20 MG tablet, TAKE 1 TABLET BY MOUTH ONCE DAILY FOR  HEARTBURN, Disp: 90 tablet, Rfl: 2   tadalafil  (CIALIS ) 5 MG tablet, TAKE 1 TABLET BY MOUTH ONCE DAILY FOR FREQUENT URINATION, Disp: 90 tablet, Rfl: 2   telmisartan -hydrochlorothiazide  (MICARDIS  HCT) 40-12.5 MG tablet,  Take 1 tablet by mouth once daily for blood pressure, Disp: 90 tablet, Rfl: 1   traZODone  (DESYREL ) 50 MG tablet, 1 or 2 for sleep as needed (Patient taking differently: Take 50 mg by mouth at bedtime as needed for sleep. 1 or 2 for sleep as needed), Disp: 60 tablet, Rfl: 3   Varenicline  Tartrate, Starter, (CHANTIX  STARTING MONTH PAK) 0.5 MG X 11 & 1 MG X 42 TBPK, Take one 0.5 mg tab daily for 3 days,then inc. to one 0.5 mg tab BID x 4 days, then inc. to one 1 mg tab BID., Disp: 53 each, Rfl: 0   folic acid  (FOLVITE ) 1 MG tablet, Take 1 tablet by mouth once daily, Disp: 90 tablet, Rfl: 0   triamcinolone  cream  (KENALOG ) 0.1 %, Apply 1 Application topically 2 (two) times daily as needed. For rash., Disp: 30 g, Rfl: 0 Medication Side Effects: none  Family Medical/ Social History: Changes? No  MENTAL HEALTH EXAM:  There were no vitals taken for this visit.There is no height or weight on file to calculate BMI.  General Appearance: Casual and Well Groomed  Eye Contact:  Good  Speech:  Clear and Coherent and Normal Rate  Volume:  Normal  Mood:  Euthymic  Affect:  smiles more, even laughed a few times  Thought Process:  Goal Directed and Descriptions of Associations: Circumstantial  Orientation:  Full (Time, Place, and Person)  Thought Content: Logical   Suicidal Thoughts:  No  Homicidal Thoughts:  No  Memory:  WNL  Judgement:  Good  Insight:  Good  Psychomotor Activity:  Normal  Concentration:  Concentration: Good  Recall:  Good  Fund of Knowledge: Good  Language: Good  Assets:  Communication Skills Desire for Improvement Financial Resources/Insurance Housing Transportation  ADL's:  Intact  Cognition: WNL  Prognosis:  Good   DIAGNOSES:    ICD-10-CM   1. Major depressive disorder, recurrent episode, moderate (HCC)  F33.1     2. Generalized anxiety disorder  F41.1     3. Alcohol use disorder  F10.90     4. Smoker  F17.200      Receiving Psychotherapy: No   RECOMMENDATIONS:   PDMP reviewed.  Librium  filled 08/16/2023.  Lunesta  filled on 02/18/2023. I provided approximately  20  minutes of face to face time during this encounter, including time spent before and after the visit in records review, medical decision making, counseling pertinent to today's visit, and charting.   He is responding well to the Abilify .  I recommend increasing the dose.   Increase Abilify  to 10 mg, 1 p.o. every morning. Continue trazodone  50 mg, 1-2 p.o. nightly as needed sleep. Recommend he get back in with Arvin Herald.  Return in 6 weeks.  Verneita Cooks, PA-C

## 2023-10-17 ENCOUNTER — Ambulatory Visit: Admitting: Internal Medicine

## 2023-10-17 DIAGNOSIS — R0600 Dyspnea, unspecified: Secondary | ICD-10-CM | POA: Insufficient documentation

## 2023-10-17 NOTE — Assessment & Plan Note (Signed)
 Acute worsening on chronic shortness of breath. Patient notes significant issues in the middle of the night with symptoms that sounds like paroxysmal nocturnal dyspnea.  He has a history of COPD on chronic inhalers Breztri  and albuterol  but does not have current wheeze or change in cough.  Will evaluate with labs to rule out anemia, thyroid  heart failure as cause of symptoms.

## 2023-10-17 NOTE — Assessment & Plan Note (Signed)
 Chronic, tobacco use at least in part causing some amount of shortness of breath.  Recommend smoking cessation and patient is interested in starting a medication to quit. Discussed options in detail and he has chosen to move forward with Chantix .  Prescription sent to pharmacy.

## 2023-10-17 NOTE — Assessment & Plan Note (Addendum)
 Chronic, evaluate with labs for possible secondary cause. Could be worse due to recent increase in Abilify .  Given he is feeling better overall on higher dose with his mood encouraged him to continue current dose.

## 2023-10-20 ENCOUNTER — Other Ambulatory Visit: Payer: Self-pay | Admitting: Primary Care

## 2023-10-20 DIAGNOSIS — F32A Depression, unspecified: Secondary | ICD-10-CM

## 2023-10-20 NOTE — Progress Notes (Signed)
 Remote PPM Transmission

## 2023-10-31 ENCOUNTER — Telehealth: Payer: Self-pay

## 2023-10-31 NOTE — Telephone Encounter (Signed)
 Gave pt a call, pt is coming up for reenrollment on [place] Breztri  ,pt did not answer left a HIPAA VM, will mail out PAP and faxed provider portion today.

## 2023-11-06 NOTE — Progress Notes (Unsigned)
 Cardiology Clinic Note   Date: 11/08/2023 ID: Caprice Wasko, DOB [birth year], MRN [id number]  [facility] Providers Cardiologist:  [name], MD Electrophysiologist:  Fonda Kitty, MD   Chief Complaint   Brandon Lopez is a 68 y.o. male who presents to the clinic today for routine follow up.   Patient Profile   Brandon Lopez is followed by Dr. Gollan and Dr. Kitty for the history outlined below.      Past medical history significant for: Nonobstructive CAD. Nuclear stress test 08/26/2017: Low risk scan without ischemia. Chronic diastolic heart failure/nonischemic cardiomyopathy. Echo 04/22/2021: EF 60 to 65%.  No RWMA.  Grade I DD.  Normal RV size/function.  Normal PA pressure.  RVSP 21.6 mmHg.  Mild MR. PAF. Symptomatic bradycardia. S/p PPM implantation 2014 at [facility]. Remote device check 09/06/2023: Normal device function.  Battery and lead parameters stable with stable capture and sensing.  Device programming appropriate. Subclinical A-fib. Hypertension. Hyperlipidemia. Lipid panel 01/18/2023: LDL 74, HDL 44, TG 124, total 43. OSA. S/p inspire insertion December 2023. COPD. GERD. Tobacco abuse. Alcohol use. Carotid artery disease. Carotid duplex 08/31/2017: Bilateral ICA 1-39%.  In summary, patient was previously followed at [facility].  He underwent PPM implantation in 2014 for symptomatic bradycardia with heart rate in the 40s.  He established care with Dr. Fernande in May 2016.  At that time he complained of DOE.  Myoview  showed no ischemia with EF 50%.  In December 2017 he was noted to have A-fib on routine follow-up.  Given CHA2DS2-VASc of 1 he was not placed on anticoagulation.  In July 2019 he underwent hospital admission for syncopal episode that was preceded by chest pressure.  Device interrogation showed 18 episodes of A-fib with RVR for total of 3 hours.  His initial workup was essentially unremarkable  other than hypokalemia.  Echo showed EF 40 to 45%, unable to exclude RWMA, Grade I DD, RVSF normal.  He was started on Xarelto  and aspirin  was discontinued.  He was also started on Toprol .  Outpatient nuclear stress test was a low risk scan without ischemia.  Upon follow-up with Dr. Fernande in September 2019 anticoagulation was discontinued secondary to diagnosis of subclinical A-fib.   Patient presented to the ED on 01/08/2023 with complaints of elevated BP and chest pain. He had been seen by PCP earlier in the week and BP meds were adjusted. Workup was unremarkable with normal kidney function, electrolytes, blood counts. Troponin negative x 2. Chest x-ray without acute cardiopulmonary process.   Patient was seen in the office on 01/14/2023 for hospital follow-up.  Patient reported normal BP ~125/70.  He stated he had been getting readings in the triple digits top and bottom numbers.  He did not provide logs of readings.  He reported chest tightness only occurs with elevated BP reading.  He reported smoking 1+ packs per day and drinking 3-8 beers nightly.  PCP had increased olmesartan  but BP became too low so he went back to previous dose.  He declined further medication changes.  He was instructed to continue monitoring BP.   Patient was last seen in the office by me on 05/05/2023.  He was doing well at that time.  He described an episode of dizziness similar to past vertigo 1 to 2 weeks prior to visit.  He was encouraged to mention this to his PCP at his upcoming visit.  He was encouraged to work on tobacco/alcohol cessation.  He reported working on  getting established with a therapist to address depression/anxiety issues. He was also seen by Suzann Riddle, NP on the same day for a device check.     History of Present Illness    Today, patient is doing well. Patient denies shortness of breath, dyspnea on exertion, lower extremity edema, orthopnea or PND. No chest pain, pressure, or tightness. No  palpitations.  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 He has been working with behavioral health to get medications adjusted for his depression. He feels improved since his last visit. He has cut down on smoking to 1 pack per day and drinking to 2-3 beers a night.      ROS: All other systems reviewed and are otherwise negative except as noted in History of Present Illness.  EKGs/Labs Reviewed    EKG Interpretation Date/Time:  Tuesday November 08 2023 10:30:27 EDT Ventricular Rate:  79 PR Interval:  246 QRS Duration:  84 QT Interval:  368 QTC Calculation: 421 R Axis:   49  Text Interpretation: Atrial-paced rhythm with prolonged AV conduction Low voltage QRS When compared with ECG of 05-May-2023 10:20, No significant change was found Confirmed by Loistine Sober [phone]) on 11/08/2023 10:44:54 AM   09/30/2023: ALT 15; AST 15; BUN 9; Creatinine, Ser 0.70; Potassium 4.3; Sodium 141   09/30/2023: Hemoglobin 15.8; WBC 7.0   09/30/2023: TSH 1.93   09/30/2023: Pro B Natriuretic peptide (BNP) 25.0    Risk Assessment/Calculations     CHA2DS2-VASc Score = 2   This indicates a 2.2% annual risk of stroke. The patient's score is based upon: CHF History: 0 HTN History: 1 Diabetes History: 0 Stroke History: 0 Vascular Disease History: 0 Age Score: 1 Gender Score: 0             Physical Exam    VS:  BP 118/82 (BP Location: Left Arm)   Pulse 79   Ht 5' 5 (1.651 m)   Wt 179 lb 6.4 oz (81.4 kg)   SpO2 94%   BMI 29.85 kg/m  , BMI Body mass index is 29.85 kg/m.  GEN: Well nourished, well developed, in no acute distress. Neck: No JVD or carotid bruits. Cardiac:  RRR.  No murmur. No rubs or gallops.   Respiratory:  Respirations regular and unlabored. Clear to auscultation without rales, wheezing or rhonchi. GI: Soft, nontender, nondistended. Extremities: Radials/DP/PT 2+ and equal bilaterally. No clubbing or cyanosis. No edema  Skin: Warm and dry, no rash. Neuro:  Strength intact.  Assessment & Plan   Nonobstructive CAD Nuclear stress test August 2019 was a low risk study without ischemia.  Patient denies chest pain, pressure or tightness. He works a labor-intensive job as a Surveyor, minerals and is able to do all of his duties with no limitations. -Continue aspirin , carvedilol , atorvastatin , Zetia .   Chronic diastolic heart failure Echo March 2023 showed normal LV/RV function, Grade I DD, Grade I DD, normal PA pressure.  Patient denies shortness of breath, DOE, orthopnea or PND.  Euvolemic and well compensated on exam. - Continue telmisartan , carvedilol .   PAF No PAF detected on last remote device check August 2025.  Patient denies palpitations. EKG showed a-paced rhythm.  - Continue aspirin , carvedilol .   Hypertension BP today 118/82. No report of headache or dizziness. - Continue carvedilol , telmisartan .   Hyperlipidemia LDL 74 December 2024. - Continue atorvastatin  and Zetia . - Labs drawn yearly by PCP.   Tobacco abuse Patient has decreased smoking to 1 pack per day. He  is congratulated for his efforts.  - Encouraged complete cessation.   Alcohol use Patient has decreased alcohol use to 2-3 beers a night. He is congratulated for this.  - Encouraged to continue to decrease alcohol intake.  Disposition: Return in 1 year or sooner as needed. Schedule follow up with EP.          Signed, Barnie HERO. Khanh Tanori, DNP, NP-C

## 2023-11-08 ENCOUNTER — Ambulatory Visit: Attending: Student | Admitting: Student

## 2023-11-08 ENCOUNTER — Encounter: Payer: Self-pay | Admitting: Student

## 2023-11-08 VITALS — BP 118/82 | HR 79 | Ht 65.0 in | Wt 179.4 lb

## 2023-11-08 DIAGNOSIS — I5032 Chronic diastolic (congestive) heart failure: Secondary | ICD-10-CM | POA: Diagnosis not present

## 2023-11-08 DIAGNOSIS — F1090 Alcohol use, unspecified, uncomplicated: Secondary | ICD-10-CM | POA: Diagnosis not present

## 2023-11-08 DIAGNOSIS — I2510 Atherosclerotic heart disease of native coronary artery without angina pectoris: Secondary | ICD-10-CM

## 2023-11-08 DIAGNOSIS — Z720 Tobacco use: Secondary | ICD-10-CM | POA: Diagnosis not present

## 2023-11-08 DIAGNOSIS — I480 Paroxysmal atrial fibrillation: Secondary | ICD-10-CM | POA: Diagnosis not present

## 2023-11-08 DIAGNOSIS — E785 Hyperlipidemia, unspecified: Secondary | ICD-10-CM | POA: Diagnosis not present

## 2023-11-08 DIAGNOSIS — I10 Essential (primary) hypertension: Secondary | ICD-10-CM

## 2023-11-08 NOTE — Patient Instructions (Signed)
 Medication Instructions:   Your physician recommends that you continue on your current medications as directed. Please refer to the Current Medication list given to you today.    *If you need a refill on your cardiac medications before your next appointment, please call your pharmacy*  Lab Work:  None ordered at this time   If you have labs (blood work) drawn today and your tests are completely normal, you will receive your results only by:  MyChart Message (if you have MyChart) OR  A paper copy in the mail If you have any lab test that is abnormal or we need to change your treatment, we will call you to review the results.  Testing/Procedures:  None ordered at this time   Referrals:  None ordered at this time   Follow-Up:  At [facility], you and your health needs are our priority.  As part of our continuing mission to provide you with exceptional heart care, our providers are all part of one team.  This team includes your primary Cardiologist (physician) and Advanced Practice Providers or APPs (Physician Assistants and Nurse Practitioners) who all work together to provide you with the care you need, when you need it.  Your next appointment:   1 year(s)  Provider:    [name], MD or Brandon Hila, NP   Schedule Follow-Up with Brandon Riddle, NP next available.  We recommend signing up for the patient portal called MyChart.  Sign up information is provided on this After Visit Summary.  MyChart is used to connect with patients for Virtual Visits (Telemedicine).  Patients are able to view lab/test results, encounter notes, upcoming appointments, etc.  Non-urgent messages can be sent to your provider as well.   To learn more about what you can do with MyChart, go to [email or web address].

## 2023-11-10 ENCOUNTER — Other Ambulatory Visit: Payer: Self-pay | Admitting: Cardiovascular Disease

## 2023-11-14 ENCOUNTER — Other Ambulatory Visit (HOSPITAL_COMMUNITY): Payer: Self-pay

## 2023-11-14 NOTE — Telephone Encounter (Signed)
 Received pt portion pap [employer] ,waiting on provider portion

## 2023-11-16 NOTE — Telephone Encounter (Signed)
 Received provider portion [employer] faxed to [employer] along pt portion.

## 2023-11-17 ENCOUNTER — Ambulatory Visit: Admitting: Psychology

## 2023-11-17 DIAGNOSIS — F331 Major depressive disorder, recurrent, moderate: Secondary | ICD-10-CM | POA: Diagnosis not present

## 2023-11-17 NOTE — Progress Notes (Signed)
  Behavioral Health Counselor/Therapist Progress Note  Patient ID: Brandon Lopez, MRN: [id number],    Date: 11/17/2023  Time Spent:9:00am-9:58am Pt is seen for an in person visit.    Treatment Type: Individual Therapy  Reported Symptoms: pt reports depressed moods, thoughts of not being wanted, worry re: relationship and rejection  Mental Status Exam: Appearance:  Well Groomed     Behavior: Appropriate  Motor: Normal  Speech/Language:  Clear and Coherent  Affect: Appropriate  Mood: anxious and depressed  Thought process: normal  Thought content:   WNL  Sensory/Perceptual disturbances:   WNL  Orientation: oriented to person, place, time/date, and situation  Attention: Good  Concentration: Good  Memory: WNL  Fund of knowledge:  Good  Insight:   Good  Judgment:  Good  Impulse Control: Good   Risk Assessment: Danger to Self:  No Self-injurious Behavior: No Danger to Others: No Duty to Warn:no Physical Aggression / Violence:No  Access to Firearms a concern: No  Gang Involvement:No   Subjective: Counselor assessed pt current functioning per pt report.  Processed w/pt stressors, thoughts and depressed moods.  Reflected improvement w/ increased engagement and motivation.  Assisted pt w/ identifying distortions and challenging w/ facts.  Encouraged communication re: relationship and what wants.   Pt affect wnl.  Pt reports he has been working w/ Verneita Cooks, PA for medication management and taking Abilify  that is helping depression.  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  Pt discussed biggest stressor is relationship and not knowing status of relationship and worry re: rejection.  Pt increased awareness that avoids for fear of rejection.  Pt also recognizes that she hasn't been rejecting in interactions.  Pt identified that needs to begin have communication about relationship to  move forward.    Interventions: Cognitive Behavioral Therapy, Assertiveness/Communication, and supporitive  Diagnosis:Major depressive disorder, recurrent episode, moderate (HCC)  Plan: pt to f/u/ w counseling every 2 weeks.  Pt to f/u w/ PCP as scheduled for medication management.    Individualized Treatment Plan Strengths: enjoys watching racing.    Supports: Diane- girlfriend.  Bestfriend has helped him on projects.     Goal/Needs for Treatment:  In order of importance to patient 1) Increased motivation for his work/decrease loss of interest 2) improve relationship  3) cope w/ grief    Client Statement of Needs: I want to feel motivated to complete my work and give 100% to my jobs again I want to get my relationship in a better place   Treatment Level:outpatient counseling  Symptoms:depressed mood, loss of interest  Client Treatment Preferences:weekly to biweekly counseling.  Continue w/ PCP for medication management.    Healthcare consumer's goal for treatment:  Counselor, Damien Herald, [facility] will support the patient's ability to achieve the goals identified. Cognitive Behavioral Therapy, Assertive Communication/Conflict Resolution Training, Relaxation Training, ACT, Humanistic and other evidenced-based practices will be used to promote progress towards healthy functioning.   Healthcare consumer will: Actively participate in therapy, working towards healthy functioning.    *Justification for Continuation/Discontinuation of Goal: R=Revised, O=Ongoing, A=Achieved, D=Discontinued  Goal 1) Increase coping w/ depression and increase motivation, decreased loss of interest AEB pt report.  Baseline date 05/23/23: Progress towards goal 0; How Often - Daily Target Date Goal Was reviewed Status Code Progress towards goal/Likert rating  05/22/24                Goal  2) Increase effective communication in relationships and engaging in interactions AEB pt report. Baseline date  05/23/23: Progress towards goal 0; How Often - Daily Target Date Goal Was reviewed Status Code Progress towards goal  05/22/24                Goal 3) Increase coping w/ loss of brother AEB pt expressing feelings/thoughts of grief in sessions Baseline date 05/23/23: Progress towards goal 0; How Often - Daily Target Date Goal Was reviewed Status Code Progress towards goal  05/22/24                This plan has been reviewed and created by the following participants:  This plan will be reviewed at least every 12 months. Date Behavioral Health Clinician Date Guardian/Patient   05/23/23  [facility]barann [facility] 05/23/23 Verbal Consent Provided and electronic signature obtained                           BARBARANN APPL [facility]

## 2023-11-18 NOTE — Telephone Encounter (Signed)
 Received approval letter [employer]  thru 01/24/2025 approval letter index.

## 2023-11-21 NOTE — Progress Notes (Deleted)
 Electrophysiology Clinic Note    Date:  11/21/2023  Patient ID:  Brandon, Lopez 1955/02/04, MRN [id number] PCP:  Gretta Comer POUR, NP  Cardiologist:  [name], MD  Electrophysiologist:  Fonda Kitty, MD    ***refresh  Discussed the use of AI scribe software for clinical note transcription with the patient, who gave verbal consent to proceed.   Patient Profile    Chief Complaint: ***  History of Present Illness: Brandon Lopez is a 68 y.o. male with PMH notable for SCAF, PVCs, HFpEF, HTN, SND s/p PPM, orthostatic intolerance, OSA s/p inspira, ETOH abuse, tobacco use ; seen today for Fonda Kitty, MD for routine electrophysiology followup.   Since last being seen in our clinic the patient reports doing ***.  he denies chest pain, palpitations, dyspnea, PND, orthopnea, nausea, vomiting, dizziness, syncope, edema, weight gain, or early satiety.      Arrhythmia/Device History Biotronik dual chamber PPM, imp 08/2012; dx SND, bradycardia    ROS:  Please see the history of present illness. All other systems are reviewed and otherwise negative.    Physical Exam    VS:  There were no vitals taken for this visit. BMI: There is no height or weight on file to calculate BMI.           Wt Readings from Last 3 Encounters:  11/08/23 179 lb 6.4 oz (81.4 kg)  10/13/23 178 lb (80.7 kg)  09/30/23 179 lb 6 oz (81.4 kg)     GEN- The patient is well appearing, alert and oriented x 3 today.   Lungs- Clear to ausculation bilaterally, normal work of breathing.  Heart- {Blank single:19197::Regular,Irregularly irregular} rate and rhythm, no murmurs, rubs or gallops Extremities- {EDEMA LEVEL:28147::No} peripheral edema, warm, dry Skin-  *** device pocket well-healed, no tethering   *** Brief check performed without iterative lead testing/measurements   Device interrogation done today and reviewed by myself:  Battery *** Lead thresholds, impedence,  sensing stable *** *** episodes *** changes made today   Studies Reviewed   Previous EP, cardiology notes.    EKG {ACTION; IS/IS WNU:[id number]} ordered. Personal review of EKG from {Blank single:19197::today,***} shows:  ***        TTE, 04/22/2021  1. Left ventricular ejection fraction, by estimation, is 60 to 65%. The left ventricle has normal function. The left ventricle has no regional wall motion abnormalities. Left ventricular diastolic parameters are consistent with Grade I diastolic dysfunction (impaired relaxation).   2. Right ventricular systolic function is normal. The right ventricular size is normal. There is normal pulmonary artery systolic pressure. The estimated right ventricular systolic pressure is 21.6 mmHg.   3. The mitral valve is normal in structure. Mild mitral valve regurgitation. No evidence of mitral stenosis.   4. The aortic valve was not well visualized. Aortic valve regurgitation is not visualized. No aortic stenosis is present.   5. The inferior vena cava is normal in size with greater than 50% respiratory variability, suggesting right atrial pressure of 3 mmHg.    NM Myocardial spect, 08/26/2016 Pharmacological myocardial perfusion imaging study with no significant  Ischemia Small region mild fixed defect apical wall on attenuation corrected images, unable to exclude processing air No apical defect noted on non-attenuation corrected images Non-attenuation corrected images with diaphragmatic attenuation/mild fixed decreased inferior wall perfusion. Defect resolved with attenuation correction Normal wall motion, EF estimated at 57% No EKG changes concerning for ischemia at peak stress or in recovery. Low risk scan  TTE, 08/11/2017 - Left ventricle: The cavity size was normal. Systolic function was mildly to moderately reduced. The estimated ejection fraction was in the range of 40% to 45%. Regional wall motion abnormalities cannot be excluded. Doppler  parameters are consistent with abnormal left ventricular relaxation (grade 1 diastolic dysfunction).  - Left atrium: The atrium was normal in size.  - Right ventricle: Pacer wire or catheter noted in right ventricle. Systolic function was normal.  - Pulmonary arteries: Systolic pressure was within the normal range.     Assessment and Plan     #) ***   #) ***   {Are you ordering a CV Procedure (e.g. stress test, cath, DCCV, TEE, etc)?   Press F2        :[id number]}   Current medicines are reviewed at length with the patient today.   The patient {ACTIONS; HAS/DOES NOT HAVE:19233} concerns regarding his medicines.  The following changes were made today:  {NONE DEFAULTED:18576}  Labs/ tests ordered today include: *** No orders of the defined types were placed in this encounter.    Disposition: Follow up with {EPMDS:28135::EP Team} or EP APP {EPFOLLOW UP:28173}   Signed, Chantal Needle, NP  11/21/23  1:02 PM  Electrophysiology [facility]

## 2023-11-22 ENCOUNTER — Other Ambulatory Visit: Payer: Self-pay | Admitting: Primary Care

## 2023-11-22 ENCOUNTER — Ambulatory Visit: Admitting: Cardiology

## 2023-11-22 DIAGNOSIS — N401 Enlarged prostate with lower urinary tract symptoms: Secondary | ICD-10-CM

## 2023-11-23 NOTE — Telephone Encounter (Signed)
 Patient is due for CPE/follow up in late December/early January, this will be required prior to any further refills.  Please schedule, thank you!

## 2023-11-25 ENCOUNTER — Encounter: Payer: Self-pay | Admitting: Physician Assistant

## 2023-11-25 ENCOUNTER — Ambulatory Visit: Admitting: Physician Assistant

## 2023-11-25 DIAGNOSIS — F1090 Alcohol use, unspecified, uncomplicated: Secondary | ICD-10-CM

## 2023-11-25 DIAGNOSIS — F411 Generalized anxiety disorder: Secondary | ICD-10-CM | POA: Diagnosis not present

## 2023-11-25 DIAGNOSIS — F17200 Nicotine dependence, unspecified, uncomplicated: Secondary | ICD-10-CM | POA: Diagnosis not present

## 2023-11-25 DIAGNOSIS — F331 Major depressive disorder, recurrent, moderate: Secondary | ICD-10-CM

## 2023-11-25 MED ORDER — ARIPIPRAZOLE 20 MG PO TABS: 20.0000 mg | ORAL_TABLET | Freq: Every day | ORAL | 1 refills | Status: DC

## 2023-11-25 NOTE — Progress Notes (Signed)
 [facility]  Patient ID: Brandon Lopez,  MRN: [id number]  PCP: Gretta Comer POUR, NP  Date of Evaluation: 11/25/2023 Time spent:20 minutes  Chief Complaint:  Chief Complaint   Follow-up    HISTORY/CURRENT STATUS: HPI For 6 week med check.  We increased the Abilify  6 weeks ago.  States it helped for awhile, he wanted to do things for about 3 weeks but then it stopped working.  He then went back to the extreme fatigue and low mood.  States he wants to be busy but he has not had much work lately.  That has been hard for him, he sits around and feels down.  No feelings of hopelessness at least not as bad.  Sleeps well most of the time. ADLs and personal hygiene are normal.   Denies any changes in concentration, making decisions, or remembering things.  Appetite has not changed.  Weight is stable.  No complaints of anxiety.  No mania, delirium, AH/VH.  No SI/HI.  He is still drinking alcohol.  Was not successful in quitting smoking.  Individual Medical History/ Review of Systems: Changes? :No     Past medications for mental health diagnoses include: He's been on many different antidepressants, remembers Wellbutrin  for smoking cessation. Help depression a little but pooped out.  Prozac , Zoloft , Effexor . Chantix  Gabapentin  Lunesta  Trazodone   Went to [place] years ago for counseling and med management.  Never hosp for mental health reasons  Allergies: Simvastatin and Lisinopril   Current Medications:  Current Outpatient Medications:    albuterol  (VENTOLIN  HFA) 108 (90 Base) MCG/ACT inhaler, INHALE 2 PUFFS BY MOUTH EVERY 6 HOURS AS NEEDED FOR WHEEZING AND FOR SHORTNESS OF BREATH, Disp: 18 g, Rfl: 0   amoxicillin  (AMOXIL ) 500 MG capsule, Take 500 mg by mouth every 8 (eight) hours., Disp: , Rfl:    ARIPiprazole  (ABILIFY ) 20 MG tablet, Take 1 tablet (20 mg total) by mouth daily., Disp: 30 tablet, Rfl: 1   aspirin  EC 81 MG tablet, Take 81 mg by mouth daily.  Swallow whole., Disp: , Rfl:    atorvastatin  (LIPITOR) 20 MG tablet, TAKE 1 TABLET BY MOUTH ONCE DAILY FOR CHOLESTEROL, Disp: 90 tablet, Rfl: 2   Budeson-Glycopyrrol-Formoterol  (BREZTRI  AEROSPHERE) 160-9-4.8 MCG/ACT AERO, Inhale 2 puffs into the lungs 2 (two) times daily., Disp: 10.7 g, Rfl: 11   carvedilol  (COREG ) 3.125 MG tablet, Take 1 tablet (3.125 mg total) by mouth 2 (two) times daily., Disp: 180 tablet, Rfl: 1   Cholecalciferol (VITAMIN D3) 1.25 MG (50000 UT) CAPS, Take 1 capsule (1.25 mg total) by mouth once a week., Disp: 12 capsule, Rfl: 0   cyanocobalamin  (VITAMIN B12) 1000 MCG tablet, Take 1 tablet (1,000 mcg total) by mouth daily., Disp: 30 tablet, Rfl: 11   EPINEPHrine  0.3 mg/0.3 mL IJ SOAJ injection, Inject 0.3 mg into the muscle as needed for anaphylaxis., Disp: 1 each, Rfl: 0   ezetimibe  (ZETIA ) 10 MG tablet, Take 1 tablet by mouth once daily, Disp: 90 tablet, Rfl: 3   pantoprazole  (PROTONIX ) 20 MG tablet, TAKE 1 TABLET BY MOUTH ONCE DAILY FOR  HEARTBURN, Disp: 90 tablet, Rfl: 2   tadalafil  (CIALIS ) 5 MG tablet, TAKE 1 TABLET BY MOUTH ONCE DAILY FOR FREQUENT URINATION, Disp: 90 tablet, Rfl: 0   telmisartan -hydrochlorothiazide  (MICARDIS  HCT) 40-12.5 MG tablet, Take 1 tablet by mouth once daily for blood pressure, Disp: 90 tablet, Rfl: 1   triamcinolone  cream (KENALOG ) 0.1 %, Apply 1 Application topically 2 (two) times daily as needed. For rash., Disp:  30 g, Rfl: 0   folic acid  (FOLVITE ) 1 MG tablet, Take 1 tablet by mouth once daily, Disp: 90 tablet, Rfl: 0   traZODone  (DESYREL ) 50 MG tablet, 1 or 2 for sleep as needed (Patient taking differently: Take 50 mg by mouth at bedtime as needed for sleep. 1 or 2 for sleep as needed), Disp: 60 tablet, Rfl: 3   Varenicline  Tartrate, Starter, (CHANTIX  STARTING MONTH PAK) 0.5 MG X 11 & 1 MG X 42 TBPK, Take one 0.5 mg tab daily for 3 days,then inc. to one 0.5 mg tab BID x 4 days, then inc. to one 1 mg tab BID. (Patient not taking: Reported on  11/25/2023), Disp: 53 each, Rfl: 0 Medication Side Effects: none  Family Medical/ Social History: Changes? No  MENTAL HEALTH EXAM:  There were no vitals taken for this visit.There is no height or weight on file to calculate BMI.  General Appearance: Casual and Well Groomed  Eye Contact:  Good  Speech:  Clear and Coherent and Normal Rate  Volume:  Normal  Mood:  Euthymic  Affect:  Congruent and smiled a few times  Thought Process:  Goal Directed and Descriptions of Associations: Circumstantial  Orientation:  Full (Time, Place, and Person)  Thought Content: Logical   Suicidal Thoughts:  No  Homicidal Thoughts:  No  Memory:  WNL  Judgement:  Good  Insight:  Good  Psychomotor Activity:  Normal  Concentration:  Concentration: Good  Recall:  Good  Fund of Knowledge: Good  Language: Good  Assets:  Communication Skills Desire for Improvement Financial Resources/Insurance Housing Transportation  ADL's:  Intact  Cognition: WNL  Prognosis:  Good   DIAGNOSES:    ICD-10-CM   1. Major depressive disorder, recurrent episode, moderate (HCC)  F33.1     2. Generalized anxiety disorder  F41.1     3. Alcohol use disorder  F10.90     4. Smoker  F17.200       Receiving Psychotherapy: Yes  Arvin Herald  RECOMMENDATIONS:   PDMP reviewed.  Librium  filled 08/16/2023.  Lunesta  filled on 02/18/2023. I provided approximately  20 minutes of face to face time during this encounter, including time spent before and after the visit in records review, medical decision making, counseling pertinent to today's visit, and charting.   Smoking cessation and decreasing alcohol intake was discussed.  I think the Abilify  is effective, we are at the correct dose yet.  I recommend increasing.  He will take 15 mg by using his current supply of 10 mg, 1.5 pills for approximately 1 week and then increase to 20 mg.  If in 3 weeks after taking the 20 mg, there's a drop off in mood, call and I'll increase to 25 mg  or 30 mg.  He verbalizes understanding. Continue trazodone  50 mg, 1-2 p.o. nightly as needed sleep. Continue therapy with Leann Yates.  Return in 6 to 8 weeks  Verneita Cooks, PA-C

## 2023-11-28 ENCOUNTER — Encounter: Payer: Self-pay | Admitting: Radiology

## 2023-11-30 ENCOUNTER — Ambulatory Visit

## 2023-11-30 DIAGNOSIS — L814 Other melanin hyperpigmentation: Secondary | ICD-10-CM

## 2023-11-30 DIAGNOSIS — C4491 Basal cell carcinoma of skin, unspecified: Secondary | ICD-10-CM

## 2023-11-30 DIAGNOSIS — D492 Neoplasm of unspecified behavior of bone, soft tissue, and skin: Secondary | ICD-10-CM

## 2023-11-30 DIAGNOSIS — L570 Actinic keratosis: Secondary | ICD-10-CM | POA: Diagnosis not present

## 2023-11-30 DIAGNOSIS — W908XXA Exposure to other nonionizing radiation, initial encounter: Secondary | ICD-10-CM

## 2023-11-30 DIAGNOSIS — L578 Other skin changes due to chronic exposure to nonionizing radiation: Secondary | ICD-10-CM | POA: Diagnosis not present

## 2023-11-30 DIAGNOSIS — L821 Other seborrheic keratosis: Secondary | ICD-10-CM | POA: Diagnosis not present

## 2023-11-30 DIAGNOSIS — C44311 Basal cell carcinoma of skin of nose: Secondary | ICD-10-CM | POA: Diagnosis not present

## 2023-11-30 DIAGNOSIS — L820 Inflamed seborrheic keratosis: Secondary | ICD-10-CM | POA: Diagnosis not present

## 2023-11-30 HISTORY — DX: Basal cell carcinoma of skin, unspecified: C44.91

## 2023-11-30 NOTE — Progress Notes (Signed)
 Subjective   Brandon Lopez is a 68 y.o. male who presents for the following: The patient has a spot on his right cheek to be evaluated, changing and the patient may have concern it could be cancer.   Patient is established patient    Review of Systems:    No other skin or systemic complaints except as noted in HPI or Assessment and Plan.  The following portions of the chart were reviewed this encounter and updated as appropriate: medications, allergies, medical history  Relevant Medical History:  Personal history of non melanoma skin cancer - see medical history for full details   Objective  Well appearing patient in no apparent distress; mood and affect are within normal limits. Examination was performed of the: Focused Exam of: face,scalp   Examination notable for: Lentigo/lentigines: Scattered pigmented macules that are tan to brown in color and are somewhat non-uniform in shape and concentrated in the sun-exposed areas, Seborrheic Keratosis(es): Stuck-on appearing keratotic papule(s) on the trunk, some  irritated with redness, crusting, edema, and/or partial avulsion, Actinic Damage/Elastosis: chronic sun damage: dyspigmentation, telangiectasia, and wrinkling, Actinic keratosis: Scaly erythematous macule(s) concentrated on sun exposed areas   Examination limited by: Clothing and Patient deferred removal     right cheek x 1 Stuck-on, waxy, tan-brown papules and plaques -- Discussed benign etiology and prognosis.  scalp (5) Erythematous thin papules/macules with gritty scale.  nasal tip 0.5 cm pink pearly papule    Assessment & Plan   SKIN CANCER SCREENING PERFORMED TODAY.  BENIGN SKIN FINDINGS  - Lentigines  - Seborrheic keratoses - Reassurance provided regarding the benign appearance of lesions noted on exam today; no treatment is indicated in the absence of symptoms/changes. - Reinforced importance of photoprotective strategies including liberal and frequent  sunscreen use of a broad-spectrum SPF 30 or greater, use of protective clothing, and sun avoidance for prevention of cutaneous malignancy and photoaging.  Counseled patient on the importance of regular self-skin monitoring as well as routine clinical skin examinations as scheduled.   ACTINIC DAMAGE - Chronic condition, secondary to cumulative UV/sun exposure - Recommend daily broad spectrum sunscreen SPF 30+ to sun-exposed areas, reapply every 2 hours as needed.  - Staying in the shade or wearing long sleeves, sun glasses (UVA+UVB protection) and wide brim hats (4-inch brim around the entire circumference of the hat) are also recommended for sun protection.  - Call for new or changing lesions.  Level of service outlined above   Procedures, orders, diagnosis for this visit:  INFLAMED SEBORRHEIC KERATOSIS right cheek x 1 Symptomatic, irritating, patient would like treated.  AK (ACTINIC KERATOSIS) (5) scalp (5) Destruction of lesion - scalp (5) Complexity: simple   Destruction method: cryotherapy   Informed consent: discussed and consent obtained   Timeout:  patient name, date of birth, surgical site, and procedure verified Lesion destroyed using liquid nitrogen: Yes   Region frozen until ice ball extended beyond lesion: Yes   Outcome: patient tolerated procedure well with no complications   Post-procedure details: wound care instructions given    NEOPLASM OF SKIN nasal tip Skin / nail biopsy Type of biopsy: tangential   Informed consent: discussed and consent obtained   Patient was prepped and draped in usual sterile fashion: area prepped with alochol. Anesthesia: the lesion was anesthetized in a standard fashion   Anesthetic:  1% lidocaine  w/ epinephrine  1-100,000 buffered w/ 8.4% NaHCO3 Instrument used: flexible razor blade   Hemostasis achieved with: pressure, aluminum chloride and electrodesiccation  Outcome: patient tolerated procedure well   Post-procedure details: wound  care instructions given   Post-procedure details comment:  Ointment and small bandage  Specimen 1 - Surgical pathology Differential Diagnosis: R/O BCC vs other  Check Margins: No Discussed Mohs surgery in [facility] pending biopsy results   Inflamed seborrheic keratosis  AK (actinic keratosis) -     Destruction of lesion  Neoplasm of skin -     Skin / nail biopsy -     Surgical pathology; Standing    Return to clinic: Return if symptoms worsen or fail to improve.  IFay Kirks, CMA, am acting as scribe for Lauraine JAYSON Kanaris, MD .   Documentation: I have reviewed the above documentation for accuracy and completeness, and I agree with the above.  Lauraine JAYSON Kanaris, MD

## 2023-11-30 NOTE — Patient Instructions (Addendum)

## 2023-12-01 ENCOUNTER — Ambulatory Visit: Admitting: Psychology

## 2023-12-01 DIAGNOSIS — F411 Generalized anxiety disorder: Secondary | ICD-10-CM

## 2023-12-01 DIAGNOSIS — F331 Major depressive disorder, recurrent, moderate: Secondary | ICD-10-CM

## 2023-12-01 NOTE — Progress Notes (Signed)
 [facility] Counselor/Therapist Progress Note  Patient ID: Brandon Lopez, MRN: [id number],    Date: 12/01/2023  Time Spent:9:00am-9:57am   pt is seen for an in person visit.    Treatment Type: Individual Therapy  Reported Symptoms: pt reports worry about rejection and conflict.  Pt recognizes unhappy w/ current circumstances.  Mental Status Exam: Appearance:  Well Groomed     Behavior: Appropriate  Motor: Normal  Speech/Language:  Clear and Coherent  Affect: Appropriate  Mood: anxious and depressed  Thought process: normal  Thought content:   WNL  Sensory/Perceptual disturbances:   WNL  Orientation: oriented to person, place, time/date, and situation  Attention: Good  Concentration: Good  Memory: WNL  Fund of knowledge:  Good  Insight:   Good  Judgment:  Good  Impulse Control: Good   Risk Assessment: Danger to Self:  No Self-injurious Behavior: No Danger to Others: No Duty to Warn:no Physical Aggression / Violence:No  Access to Firearms a concern: No  Gang Involvement:No   Subjective: Counselor assessed pt current functioning per pt report.  Processed w/pt positives, stressors and moods.  Reflected insight that happy w/ current circumstances and not changing if he doesn't make change/decision.  Explored w pt interactions w/ his friend and next steps can take.  Discussed communication and how to not focus on past in communicating wants.   Pt affect wnl.  Pt reports depressed mood and worries w/ talking friend.  Pt reports he has been engaged w/ work and waking up to visit friend most mornings.  Pt reports has avoided talking to friend about their relationship and wants.  Pt recognizing that he is not happy and current situation and won't change w/out action.  Pt reports barriers of avoiding conflict and arguments.  Pt receptive communication that focus on present and future-not reviewing past and identified that needs to begin have communication about  relationship to move forward.    Interventions: Cognitive Behavioral Therapy, Assertiveness/Communication, and supporitive  Diagnosis:Major depressive disorder, recurrent episode, moderate (HCC)  Generalized anxiety disorder  Plan: pt to f/u/ w counseling 1 month to check progress w/ asserting self.  Pt to f/u w/ PCP as scheduled for medication management.    Individualized Treatment Plan Strengths: enjoys watching racing.    Supports: Diane- girlfriend.  Bestfriend has helped him on projects.     Goal/Needs for Treatment:  In order of importance to patient 1) Increased motivation for his work/decrease loss of interest 2) improve relationship  3) cope w/ grief    Client Statement of Needs: I want to feel motivated to complete my work and give 100% to my jobs again I want to get my relationship in a better place   Treatment Level:outpatient counseling  Symptoms:depressed mood, loss of interest  Client Treatment Preferences:weekly to biweekly counseling.  Continue w/ PCP for medication management.    Healthcare consumer's goal for treatment:  Counselor, Damien Herald, [facility] will support the patient's ability to achieve the goals identified. Cognitive Behavioral Therapy, Assertive Communication/Conflict Resolution Training, Relaxation Training, ACT, Humanistic and other evidenced-based practices will be used to promote progress towards healthy functioning.   Healthcare consumer will: Actively participate in therapy, working towards healthy functioning.    *Justification for Continuation/Discontinuation of Goal: R=Revised, O=Ongoing, A=Achieved, D=Discontinued  Goal 1) Increase coping w/ depression and increase motivation, decreased loss of interest AEB pt report.  Baseline date 05/23/23: Progress towards goal 0; How Often - Daily Target Date Goal Was reviewed Status Code Progress towards goal/Likert  rating  05/22/24                Goal 2) Increase effective communication in  relationships and engaging in interactions AEB pt report. Baseline date 05/23/23: Progress towards goal 0; How Often - Daily Target Date Goal Was reviewed Status Code Progress towards goal  05/22/24                Goal 3) Increase coping w/ loss of brother AEB pt expressing feelings/thoughts of grief in sessions Baseline date 05/23/23: Progress towards goal 0; How Often - Daily Target Date Goal Was reviewed Status Code Progress towards goal  05/22/24                This plan has been reviewed and created by the following participants:  This plan will be reviewed at least every 12 months. Date Behavioral Health Clinician Date Guardian/Patient   05/23/23  [facility] Barbarann [facility] 05/23/23 Verbal Consent Provided and electronic signature obtained                         BARBARANN APPL [facility]

## 2023-12-02 ENCOUNTER — Ambulatory Visit: Payer: Self-pay

## 2023-12-02 LAB — SURGICAL PATHOLOGY

## 2023-12-02 NOTE — Telephone Encounter (Signed)
 Noted. Agree with nursing triage decision. Appreciate Tabitha NP evaluation.

## 2023-12-02 NOTE — Telephone Encounter (Signed)
 FYI Only or Action Required?: FYI only for provider: appointment scheduled on 11/10.  Patient was last seen in primary care on 10/13/2023 by Gretta Comer POUR, NP.  Called Nurse Triage reporting Jaw Pain.  Symptoms began 1-2 weeks ago.  Symptoms are: gradually worsening.  Triage Disposition: See PCP When Office is Open (Within 3 Days)  Patient/caregiver understands and will follow disposition?: Yes      Copied from CRM #[id number]. Topic: Clinical - Red Word Triage >> Dec 02, 2023 11:45 AM Rosina BIRCH wrote: Red Word that prompted transfer to Nurse Triage: patient stated he did something to his jaw and it is hard to eat. Patient is having jaw pain       Reason for Disposition  [1] MILD-MODERATE mouth pain AND [2] present > 3 days  Answer Assessment - Initial Assessment Questions 1. ONSET: When did your mouth start hurting? (e.g., hours or days ago)      1-2 weeks ago after his left sided jaw popped 2. SEVERITY: How bad is the pain? (Scale 1-10; or mild, moderate or severe)     Moderate  3. SORES: Are there any sores or ulcers in the mouth? If Yes, ask: What part of the mouth are the sores in?     No 4. FEVER: Do you have a fever? If Yes, ask: What is your temperature, how was it measured, and when did it start?     No 5. CAUSE: What do you think is causing the mouth pain?     Unsure  6. OTHER SYMPTOMS: Do you have any other symptoms? (e.g., difficulty breathing)     None  Protocols used: Mouth Pain-A-AH

## 2023-12-05 ENCOUNTER — Encounter: Payer: Self-pay | Admitting: Family

## 2023-12-05 ENCOUNTER — Ambulatory Visit: Payer: Self-pay

## 2023-12-05 ENCOUNTER — Telehealth: Payer: Self-pay

## 2023-12-05 ENCOUNTER — Other Ambulatory Visit: Payer: Self-pay | Admitting: Physician Assistant

## 2023-12-05 ENCOUNTER — Ambulatory Visit: Admitting: Family

## 2023-12-05 ENCOUNTER — Other Ambulatory Visit (HOSPITAL_COMMUNITY): Payer: Self-pay

## 2023-12-05 VITALS — BP 112/74 | HR 82 | Temp 98.2°F | Ht 65.5 in | Wt 181.0 lb

## 2023-12-05 DIAGNOSIS — R6884 Jaw pain: Secondary | ICD-10-CM

## 2023-12-05 DIAGNOSIS — C44311 Basal cell carcinoma of skin of nose: Secondary | ICD-10-CM

## 2023-12-05 DIAGNOSIS — M26622 Arthralgia of left temporomandibular joint: Secondary | ICD-10-CM | POA: Diagnosis not present

## 2023-12-05 MED ORDER — CYCLOBENZAPRINE HCL 5 MG PO TABS: 5.0000 mg | ORAL_TABLET | Freq: Every evening | ORAL | 1 refills | Status: DC | PRN

## 2023-12-05 NOTE — Telephone Encounter (Signed)
 Pharmacy Patient Advocate Encounter   Received notification from Onbase that prior authorization for Cyclobenzaprine  HCl 5MG  tablets is required/requested.   Insurance verification completed.   The patient is insured through [employer].   Per test claim: PA required; PA submitted to above mentioned insurance via Latent Key/confirmation #/EOC [id number] Status is pending

## 2023-12-05 NOTE — Patient Instructions (Signed)
   Recommend a soft diet to minimize stress on the TMJ.[1-3]  Advise the patient to avoid excessive mouth opening, such as yawning widely.[1-2]  Suggest the application of heat for 15-20 minutes, 4-6 times per day, to alleviate muscle pain.[3]  Consider prescribing analgesics like acetaminophen  or ibuprofen for pain management.[2]  Refer the patient to an oral surgeon or dentist specializing in TMJ disorders for further evaluation and managemen

## 2023-12-05 NOTE — Progress Notes (Signed)
 Established Patient Office Visit  Subjective:      CC:  Chief Complaint  Patient presents with   Jaw Pain    L sided, states that it pops a lot.    HPI: Brandon Lopez is a 68 y.o. male presenting on 12/05/2023 for Jaw Pain (L sided, states that it pops a lot.) .  Discussed the use of AI scribe software for clinical note transcription with the patient, who gave verbal consent to proceed.  History of Present Illness Brandon Lopez is a 68 year old male who presents with jaw pain and popping.  He has been experiencing jaw pain and popping for the past couple of weeks. The popping is loud enough to be heard by others when he yawns, and he describes a dull, persistent pain primarily on the left side. The popping occurs every time he yawns, and he finds it increasingly difficult to open his mouth wide enough to eat, necessitating modifications in how he eats certain foods. No history of temporomandibular joint (TMJ) issues or jaw problems prior to this episode. He has not used pain medication like Tylenol  frequently and has not applied heat or other treatments to the area.  He recently completed a course of penicillin for an impacted tooth on the left side, which has since improved.  He consumes alcohol daily, alternating between beer and liquor. He is currently on Abilify  and takes a baby aspirin  daily. He has a history of using trazodone  for sleep but has only taken it once since it was prescribed.   No locking of the jaw, but confirms the presence of popping and dull pain. No other associated symptoms such as fever or swelling were reported.         Social history:  Relevant past medical, surgical, family and social history reviewed and updated as indicated. Interim medical history since our last visit reviewed.  Allergies and medications reviewed and updated.  DATA REVIEWED: CHART IN EPIC     ROS: Negative unless specifically indicated above in HPI.     Current Outpatient Medications:    albuterol  (VENTOLIN  HFA) 108 (90 Base) MCG/ACT inhaler, INHALE 2 PUFFS BY MOUTH EVERY 6 HOURS AS NEEDED FOR WHEEZING AND FOR SHORTNESS OF BREATH, Disp: 18 g, Rfl: 0   amoxicillin  (AMOXIL ) 500 MG capsule, Take 500 mg by mouth every 8 (eight) hours., Disp: , Rfl:    ARIPiprazole  (ABILIFY ) 20 MG tablet, Take 1 tablet (20 mg total) by mouth daily., Disp: 30 tablet, Rfl: 1   aspirin  EC 81 MG tablet, Take 81 mg by mouth daily. Swallow whole., Disp: , Rfl:    atorvastatin  (LIPITOR) 20 MG tablet, TAKE 1 TABLET BY MOUTH ONCE DAILY FOR CHOLESTEROL, Disp: 90 tablet, Rfl: 2   Budeson-Glycopyrrol-Formoterol  (BREZTRI  AEROSPHERE) 160-9-4.8 MCG/ACT AERO, Inhale 2 puffs into the lungs 2 (two) times daily., Disp: 10.7 g, Rfl: 11   carvedilol  (COREG ) 3.125 MG tablet, Take 1 tablet (3.125 mg total) by mouth 2 (two) times daily., Disp: 180 tablet, Rfl: 1   Cholecalciferol (VITAMIN D3) 1.25 MG (50000 UT) CAPS, Take 1 capsule (1.25 mg total) by mouth once a week., Disp: 12 capsule, Rfl: 0   cyanocobalamin  (VITAMIN B12) 1000 MCG tablet, Take 1 tablet (1,000 mcg total) by mouth daily., Disp: 30 tablet, Rfl: 11   cyclobenzaprine  (FLEXERIL ) 5 MG tablet, Take 1 tablet (5 mg total) by mouth at bedtime as needed for muscle spasms., Disp: 20 tablet, Rfl: 1   EPINEPHrine  0.3 mg/0.3 mL  IJ SOAJ injection, Inject 0.3 mg into the muscle as needed for anaphylaxis., Disp: 1 each, Rfl: 0   ezetimibe  (ZETIA ) 10 MG tablet, Take 1 tablet by mouth once daily, Disp: 90 tablet, Rfl: 3   folic acid  (FOLVITE ) 1 MG tablet, Take 1 tablet by mouth once daily, Disp: 90 tablet, Rfl: 0   pantoprazole  (PROTONIX ) 20 MG tablet, TAKE 1 TABLET BY MOUTH ONCE DAILY FOR  HEARTBURN, Disp: 90 tablet, Rfl: 2   tadalafil  (CIALIS ) 5 MG tablet, TAKE 1 TABLET BY MOUTH ONCE DAILY FOR FREQUENT URINATION, Disp: 90 tablet, Rfl: 0   telmisartan -hydrochlorothiazide  (MICARDIS  HCT) 40-12.5 MG tablet, Take 1 tablet by mouth once daily  for blood pressure, Disp: 90 tablet, Rfl: 1   triamcinolone  cream (KENALOG ) 0.1 %, Apply 1 Application topically 2 (two) times daily as needed. For rash., Disp: 30 g, Rfl: 0        Objective:        BP 112/74 (BP Location: Left Arm, Patient Position: Sitting, Cuff Size: Normal)   Pulse 82   Temp 98.2 F (36.8 C) (Temporal)   Ht 5' 5.5 (1.664 m)   Wt 181 lb (82.1 kg)   SpO2 98%   BMI 29.66 kg/m   Physical Exam HEENT: Tenderness at the jaw joint on palpation. No crepitus or popping on jaw movement.  Wt Readings from Last 3 Encounters:  12/05/23 181 lb (82.1 kg)  11/08/23 179 lb 6.4 oz (81.4 kg)  10/13/23 178 lb (80.7 kg)    Physical Exam Vitals reviewed.  HENT:     Head:     Jaw: Tenderness and pain on movement present. No trismus or swelling.     Mouth/Throat:     Dentition: Abnormal dentition. No dental tenderness or dental abscesses.          Results   Assessment & Plan:   Assessment and Plan Assessment & Plan Arthralgia of left temporomandibular joint Arthralgia of the left temporomandibular joint with popping and dull pain, exacerbated by yawning and eating. Limited mouth opening. No prior TMJ issues. Recent impacted tooth on the left side, treated with penicillin. Excessive yawning may exacerbate TMJ symptoms. Avoidance of NSAIDs due to aspirin  use. Caution with Tylenol  due to alcohol consumption. Muscle relaxers considered but with caution due to alcohol use and current medications. - Apply heat to the jaw for 15-20 minutes, 4-6 times daily. - Perform range of motion exercises with gel, avoiding aggravating movements. - Use Tylenol  for pain management, being cautious due to alcohol consumption. - Avoid excessive mouth opening, especially during yawning. - Consider referral to an oral surgeon if symptoms persist after one week. - Prescribed a low-dose muscle relaxer for nighttime use, with caution regarding alcohol consumption.       Return for  f/u PCP if no improvement in symptoms.     Ginger Patrick, MSN, APRN, FNP-C [facility]

## 2023-12-05 NOTE — Telephone Encounter (Signed)
 I think the Abilify  is effective, we are at the correct dose yet.  I recommend increasing.  He will take 15 mg by using his current supply of 10 mg, 1.5 pills for approximately 1 week and then increase to 20 mg.  If in 3 weeks after taking the 20 mg, there's a drop off in mood, call and I'll increase to 25 mg or 30 mg.  He verbalizes understanding.   Verify dosing.

## 2023-12-06 ENCOUNTER — Ambulatory Visit: Payer: PPO

## 2023-12-06 ENCOUNTER — Other Ambulatory Visit (HOSPITAL_COMMUNITY): Payer: Self-pay

## 2023-12-06 DIAGNOSIS — I5032 Chronic diastolic (congestive) heart failure: Secondary | ICD-10-CM | POA: Diagnosis not present

## 2023-12-06 NOTE — Telephone Encounter (Signed)
 Patient advised of BX results and referral sent to Dr. Corey. aw

## 2023-12-06 NOTE — Telephone Encounter (Signed)
-----   Message from Lauraine JAYSON Kanaris sent at 12/05/2023  8:29 AM EST -----  1. Skin, nasal tip :       BASAL CELL CARCINOMA, NODULAR AND INFILTRATIVE PATTERNS   Please notify patient with below plan: Mohs   ----- Message ----- From: Interface, Lab In Three Zero One Sent: 12/02/2023   3:57 PM EST To: Lauraine JAYSON Kanaris, MD

## 2023-12-06 NOTE — Telephone Encounter (Signed)
Left message to return my call. -aw

## 2023-12-06 NOTE — Addendum Note (Signed)
 Addended by: TERESA PALMA R on: 12/06/2023 11:21 AM   Modules accepted: Orders

## 2023-12-07 LAB — CUP PACEART REMOTE DEVICE CHECK
Battery Voltage: 30
Date Time Interrogation Session: 20251111085816
Implantable Lead Connection Status: 753985
Implantable Lead Connection Status: 753985
Implantable Lead Implant Date: 20140801
Implantable Lead Implant Date: 20140801
Implantable Lead Location: 753859
Implantable Lead Location: 753860
Implantable Lead Model: 362
Implantable Lead Serial Number: 29356730
Implantable Lead Serial Number: 29392661
Implantable Pulse Generator Implant Date: 20140801
Pulse Gen Serial Number: 68053398

## 2023-12-07 NOTE — Telephone Encounter (Signed)
 Pharmacy Patient Advocate Encounter  Received notification from [facility] [employer] that Prior Authorization for Cyclobenzaprine  HCl 5MG  tablets  has been APPROVED from 12/06/2023 to 01/03/2024    PA #/Case ID/Reference #: [id number]

## 2023-12-08 ENCOUNTER — Telehealth: Payer: Self-pay | Admitting: Physician Assistant

## 2023-12-08 NOTE — Telephone Encounter (Signed)
 Pt seen 10/31: Smoking cessation and decreasing alcohol intake was discussed.   I think the Abilify  is effective, we are at the correct dose yet.  I recommend increasing.  He will take 15 mg by using his current supply of 10 mg, 1.5 pills for approximately 1 week and then increase to 20 mg.  If in 3 weeks after taking the 20 mg, there's a drop off in mood, call and I'll increase to 25 mg or 30 mg.  He verbalizes understanding. Continue trazodone  50 mg, 1-2 p.o. nightly as needed sleep. Continue therapy with Leann Yates.  Return in 6 to 8 weeks  He is currently taking 20 mg of Abilify . He is reporting increased BP and difficulty breathing at night. I asked for BP readings and he gave me 152/98 and 140/92. These are late afternoon readings after returning from work, said he doesn't have time to take BP in the mornings. He is also reporting jaw pain. I asked if pain or twitching and he said it pops. He reports this has been ongoing for at least a month, he just forgot to mention to you at his appt. He is still smoking, did not ask about alcohol use.

## 2023-12-08 NOTE — Telephone Encounter (Signed)
 Reviewed tapering instructions for Abilify . He reports being seen by PCP 2 days ago.

## 2023-12-08 NOTE — Telephone Encounter (Signed)
 Pt called at 9:04a stating Verneita has him taking Abilify . For the last 3 or 4 days he has been experiencing side effects he thinks from the medication.  He said he has jaw pain, his blood pressure is fluctuating and at night, he has breathing problems.   Next appt 12/19

## 2023-12-08 NOTE — Telephone Encounter (Signed)
 Have him decrease Abilify  to 10 mg, 1 p.o. daily for 4 days then 5 mg daily for 4 days and then stop.  However if he is having worsening of shortness of breath, tongue swelling, hives or itching he needs to stop the Abilify  immediately and go to the ER.  I would recommend that he see his PCP as soon as possible anyway because of these symptoms, because they may be completely unrelated to the Abilify .  Elevated blood pressure, shortness of breath, and jaw pain may be a cardiac issue and it does not need to be ignored.

## 2023-12-09 ENCOUNTER — Ambulatory Visit: Payer: Self-pay | Admitting: Cardiology

## 2023-12-09 NOTE — Progress Notes (Signed)
 Remote PPM Transmission

## 2023-12-12 ENCOUNTER — Emergency Department

## 2023-12-12 ENCOUNTER — Other Ambulatory Visit: Payer: Self-pay

## 2023-12-12 ENCOUNTER — Emergency Department
Admission: EM | Admit: 2023-12-12 | Discharge: 2023-12-12 | Disposition: A | Discharge status: Home / Self Care | Attending: Emergency Medicine | Admitting: Emergency Medicine

## 2023-12-12 DIAGNOSIS — I480 Paroxysmal atrial fibrillation: Secondary | ICD-10-CM | POA: Insufficient documentation

## 2023-12-12 DIAGNOSIS — J449 Chronic obstructive pulmonary disease, unspecified: Secondary | ICD-10-CM | POA: Insufficient documentation

## 2023-12-12 DIAGNOSIS — I10 Essential (primary) hypertension: Secondary | ICD-10-CM | POA: Insufficient documentation

## 2023-12-12 DIAGNOSIS — R079 Chest pain, unspecified: Secondary | ICD-10-CM | POA: Diagnosis not present

## 2023-12-12 LAB — CBC
HCT: 45.9 % (ref 39.0–52.0)
Hemoglobin: 15.8 g/dL (ref 13.0–17.0)
MCH: 31.3 pg (ref 26.0–34.0)
MCHC: 34.4 g/dL (ref 30.0–36.0)
MCV: 90.9 fL (ref 80.0–100.0)
Platelets: 225 K/uL (ref 150–400)
RBC: 5.05 MIL/uL (ref 4.22–5.81)
RDW: 11.9 % (ref 11.5–15.5)
WBC: 7.5 K/uL (ref 4.0–10.5)
nRBC: 0 % (ref 0.0–0.2)

## 2023-12-12 LAB — BASIC METABOLIC PANEL WITH GFR
Anion gap: 10 (ref 5–15)
BUN: 7 mg/dL — ABNORMAL LOW (ref 8–23)
CO2: 24 mmol/L (ref 22–32)
Calcium: 9.2 mg/dL (ref 8.9–10.3)
Chloride: 106 mmol/L (ref 98–111)
Creatinine, Ser: 0.68 mg/dL (ref 0.61–1.24)
GFR, Estimated: >60 mL/min (ref >60)
Glucose, Bld: 100 mg/dL — ABNORMAL HIGH (ref 70–99)
Potassium: 3.4 mmol/L — ABNORMAL LOW (ref 3.5–5.1)
Sodium: 140 mmol/L (ref 135–145)

## 2023-12-12 LAB — TROPONIN T, HIGH SENSITIVITY: Troponin T High Sensitivity: <15 ng/L (ref 0–19)

## 2023-12-12 NOTE — ED Triage Notes (Addendum)
 Pt arrives via POV with c/o hypertension that started when they woke up this morning. BP was 165/102 and when they took it again 30 mins later 185/109. Pt takes atorvastatin  and another med they can't remember the name of. Pt took their BP meds this morning. Pt is A&Ox4 and ambulatory during triage.   Pt is weaning off of their Abilify  because of side effects.

## 2023-12-12 NOTE — ED Provider Notes (Signed)
 [facility] Provider Note    Event Date/Time   First MD Initiated Contact with Patient 12/12/23 1004     (approximate)   History   Hypertension   HPI  Brandon Lopez is a 68 y.o. male with a history of COPD, paroxysmal atrial fibrillation among other comorbidities who presents with complaints of elevated blood pressure.  He does have a history of high blood pressure.  He does take carvedilol  per review of records.  He presents with concerns for elevated blood pressure today.  Also notes that he had slight chest pressure and dizziness, now resolved  Physical Exam   Triage Vital Signs: ED Triage Vitals  Encounter Vitals Group     BP 12/12/23 0937 (!) 151/105     Girls Systolic BP Percentile --      Girls Diastolic BP Percentile --      Boys Systolic BP Percentile --      Boys Diastolic BP Percentile --      Pulse Rate 12/12/23 0937 (!) 107     Resp 12/12/23 0937 18     Temp 12/12/23 0937 97.7 F (36.5 C)     Temp Source 12/12/23 0937 Oral     SpO2 12/12/23 0937 95 %     Weight 12/12/23 0943 77.1 kg (170 lb)     Height 12/12/23 0943 1.651 m (5' 5)     Head Circumference --      Peak Flow --      Pain Score 12/12/23 0942 0     Pain Loc --      Pain Education --      Exclude from Growth Chart --     Most recent vital signs: Vitals:   12/12/23 0937 12/12/23 1044  BP: (!) 151/105   Pulse: (!) 107 89  Resp: 18   Temp: 97.7 F (36.5 C)   SpO2: 95%      General: Awake, no distress.  CV:  Good peripheral perfusion.  Heart rate normal on my exam Resp:  Normal effort.  Clear to auscultation bilaterally Abd:  No distention.  Other:  Cranial nerves II through XII are normal, normal strength in all extremities, normal sensation   ED Results / Procedures / Treatments   Labs (all labs ordered are listed, but only abnormal results are displayed) Labs Reviewed  BASIC METABOLIC PANEL WITH GFR - Abnormal; Notable for the following  components:      Result Value   Potassium 3.4 (*)    Glucose, Bld 100 (*)    BUN 7 (*)    All other components within normal limits  CBC  TROPONIN T, HIGH SENSITIVITY     EKG  ED ECG REPORT I, Lamar Price, the attending physician, personally viewed and interpreted this ECG.  Date: 12/12/2023  Rhythm: Atrial paced rhythm QRS Axis: normal Intervals: normal ST/T Wave abnormalities: normal Narrative Interpretation: no evidence of acute ischemia   RADIOLOGY     PROCEDURES:  Critical Care performed:   Procedures   MEDICATIONS ORDERED IN ED: Medications - No data to display   IMPRESSION / MDM / ASSESSMENT AND PLAN / ED COURSE  I reviewed the triage vital signs and the nursing notes. Patient's presentation is most consistent with exacerbation of chronic illness.  Patient presents with elevated blood pressure in the setting of a history of essential hypertension with a complaint of chest discomfort and dizziness, now resolved.  He reports he is feeling better.  Labs pending,  EKG is overall reassuring.  Blood pressure has improved here from at home  Lab work is unremarkable, he is asymptomatic and reassured, he will follow-up closely with his PCP for potential adjustment of blood pressure medications      FINAL CLINICAL IMPRESSION(S) / ED DIAGNOSES   Final diagnoses:  Primary hypertension     Rx / DC Orders   ED Discharge Orders     None        Note:  This document was prepared using Dragon voice recognition software and may include unintentional dictation errors.   Arlander Charleston, MD 12/12/23 1045

## 2023-12-14 NOTE — Telephone Encounter (Signed)
 LM for [facility] to return my call.

## 2023-12-14 NOTE — Telephone Encounter (Signed)
 Can you call radiology and ask which xray or CT I would order prior to sending a patient to oral surgeon for evaluation for TMJ arthralgia/locking?

## 2023-12-15 NOTE — Telephone Encounter (Signed)
 Spoke with Dr. Shona at [facility]. He recommends a full facial CT without contrast.

## 2023-12-29 ENCOUNTER — Ambulatory Visit: Admitting: Psychology

## 2024-01-01 ENCOUNTER — Other Ambulatory Visit: Payer: Self-pay | Admitting: Primary Care

## 2024-01-01 DIAGNOSIS — E785 Hyperlipidemia, unspecified: Secondary | ICD-10-CM

## 2024-01-02 ENCOUNTER — Ambulatory Visit: Payer: Self-pay

## 2024-01-02 NOTE — Telephone Encounter (Signed)
 FYI Only or Action Required?: FYI only for provider: appointment scheduled on 01/04/24.  Patient was last seen in primary care on 12/05/2023 by Corwin Antu, FNP.  Called Nurse Triage reporting Shortness of Breath.  Symptoms began several weeks ago.  Interventions attempted: Other: Stopped taking Abilify  .  Symptoms are: stable.  Triage Disposition: See PCP Within 2 Weeks  Patient/caregiver understands and will follow disposition?: Yes Reason for Disposition  [1] MILD longstanding difficulty breathing (e.g., minimal/no SOB at rest, SOB with walking, pulse < 100) AND [2] SAME as normal  Answer Assessment - Initial Assessment Questions Patient reports being on Abilify  for awhile and started having complications, like SOB and jaw popping. Patient completely stopped taking Abilify  per instructions from psychiatrist and is still experiencing these symptoms. Patient states his pulmonologist retired and will need a new one.   1. RESPIRATORY STATUS: Describe your breathing? (e.g., wheezing, shortness of breath, unable to speak, severe coughing)      Feels like cannot get a good deep breath  2. ONSET: When did this breathing problem begin?      After starting Abilify  on 11/25/23  3. PATTERN Does the difficult breathing come and go, or has it been constant since it started?      Comes and goes, occurs at rest occasionally   4. SEVERITY: How bad is your breathing? (e.g., mild, moderate, severe)      Mild  5. RECURRENT SYMPTOM: Have you had difficulty breathing before? If Yes, ask: When was the last time? and What happened that time?      Denies  6. CARDIAC HISTORY: Do you have any history of heart disease? (e.g., heart attack, angina, bypass surgery, angioplasty)      Pace maker  7. LUNG HISTORY: Do you have any history of lung disease?  (e.g., pulmonary embolus, asthma, emphysema)     COPD  8. CAUSE: What do you think is causing the breathing problem?       Abilify   9. OTHER SYMPTOMS: Do you have any other symptoms? (e.g., chest pain, cough, dizziness, fever, runny nose)     Denies  10. O2 SATURATION MONITOR:  Do you use an oxygen saturation monitor (pulse oximeter) at home? If Yes, ask: What is your reading (oxygen level) today? What is your usual oxygen saturation reading? (e.g., 95%)       Does not have pulse oximeter  Protocols used: Breathing Difficulty-A-AH  Copied from CRM #[id number]. Topic: Clinical - Red Word Triage >> Jan 02, 2024 10:06 AM Emylou G wrote: [facility] that prompted transfer to Nurse Triage: shortness of breath.SABRA

## 2024-01-02 NOTE — Telephone Encounter (Signed)
 Please call patient:  Is he using his Breztri  inhaler every day as prescribed? 2 puffs BID? Does he have his albuterol  rescue inhaler?

## 2024-01-03 NOTE — Progress Notes (Unsigned)
 Debera Sterba T. Rennee Coyne, MD, CAQ Sports Medicine [facility] at [facility] [address] [place], [address]  Phone: [phone]  FAX: [phone]  Brandon Lopez - 68 y.o. male  MRN [id number]  Date of Birth: [birth year]  Date: 01/04/2024  PCP: Gretta Comer POUR, NP  Referral: Gretta Comer POUR, NP  No chief complaint on file.  Subjective:   Brandon Lopez is a 68 y.o. very pleasant male patient with There is no height or weight on file to calculate BMI. who presents with the following:  Discussed the use of AI scribe software for clinical note transcription with the patient, who gave verbal consent to proceed.  Patient presents with shortness of breath and jaw popping.  He does use Bretzi, 2 puffs twice daily.  He does have a history of COPD. History of Present Illness     Review of Systems is noted in the HPI, as appropriate  Objective:   There were no vitals taken for this visit.  GEN: No acute distress; alert,appropriate. PULM: Breathing comfortably in no respiratory distress PSYCH: Normally interactive.   Laboratory and Imaging Data:  Assessment and Plan:   No diagnosis found. Assessment & Plan   Medication Management during today's office visit: No orders of the defined types were placed in this encounter.  There are no discontinued medications.  Orders placed today for conditions managed today: No orders of the defined types were placed in this encounter.   Disposition: No follow-ups on file.  Dragon [employer] One speech-to-text software was used for transcription in this dictation.  Possible transcriptional errors can occur using Animal nutritionist.   Signed,  Jacques DASEN. Quinnetta Roepke, MD   Outpatient Encounter Medications as of 01/04/2024  Medication Sig   albuterol  (VENTOLIN  HFA) 108 (90 Base) MCG/ACT inhaler INHALE 2 PUFFS BY MOUTH EVERY 6 HOURS AS NEEDED FOR WHEEZING AND FOR SHORTNESS OF BREATH    amoxicillin  (AMOXIL ) 500 MG capsule Take 500 mg by mouth every 8 (eight) hours.   ARIPiprazole  (ABILIFY ) 20 MG tablet Take 1 tablet (20 mg total) by mouth daily.   aspirin  EC 81 MG tablet Take 81 mg by mouth daily. Swallow whole.   atorvastatin  (LIPITOR) 20 MG tablet TAKE 1 TABLET BY MOUTH ONCE DAILY FOR CHOLESTEROL   Budeson-Glycopyrrol-Formoterol  (BREZTRI  AEROSPHERE) 160-9-4.8 MCG/ACT AERO Inhale 2 puffs into the lungs 2 (two) times daily.   carvedilol  (COREG ) 3.125 MG tablet Take 1 tablet (3.125 mg total) by mouth 2 (two) times daily.   Cholecalciferol (VITAMIN D3) 1.25 MG (50000 UT) CAPS Take 1 capsule (1.25 mg total) by mouth once a week.   cyanocobalamin  (VITAMIN B12) 1000 MCG tablet Take 1 tablet (1,000 mcg total) by mouth daily.   cyclobenzaprine  (FLEXERIL ) 5 MG tablet Take 1 tablet (5 mg total) by mouth at bedtime as needed for muscle spasms.   EPINEPHrine  0.3 mg/0.3 mL IJ SOAJ injection Inject 0.3 mg into the muscle as needed for anaphylaxis.   ezetimibe  (ZETIA ) 10 MG tablet Take 1 tablet by mouth once daily   folic acid  (FOLVITE ) 1 MG tablet Take 1 tablet by mouth once daily   pantoprazole  (PROTONIX ) 20 MG tablet TAKE 1 TABLET BY MOUTH ONCE DAILY FOR  HEARTBURN   tadalafil  (CIALIS ) 5 MG tablet TAKE 1 TABLET BY MOUTH ONCE DAILY FOR FREQUENT URINATION   telmisartan -hydrochlorothiazide  (MICARDIS  HCT) 40-12.5 MG tablet Take 1 tablet by mouth once daily for blood pressure   triamcinolone  cream (KENALOG ) 0.1 % Apply 1 Application  topically 2 (two) times daily as needed. For rash.   No facility-administered encounter medications on file as of 01/04/2024.

## 2024-01-04 ENCOUNTER — Ambulatory Visit: Admitting: Family Medicine

## 2024-01-04 ENCOUNTER — Encounter: Payer: Self-pay | Admitting: Family Medicine

## 2024-01-04 VITALS — BP 150/90 | HR 80 | Temp 98.0°F | Ht 65.5 in | Wt 186.0 lb

## 2024-01-04 DIAGNOSIS — J441 Chronic obstructive pulmonary disease with (acute) exacerbation: Secondary | ICD-10-CM | POA: Diagnosis not present

## 2024-01-04 DIAGNOSIS — M26609 Unspecified temporomandibular joint disorder, unspecified side: Secondary | ICD-10-CM

## 2024-01-04 DIAGNOSIS — J449 Chronic obstructive pulmonary disease, unspecified: Secondary | ICD-10-CM | POA: Diagnosis not present

## 2024-01-04 DIAGNOSIS — R0602 Shortness of breath: Secondary | ICD-10-CM | POA: Diagnosis not present

## 2024-01-04 MED ORDER — ALBUTEROL SULFATE HFA 108 (90 BASE) MCG/ACT IN AERS: INHALATION_SPRAY | RESPIRATORY_TRACT | 1 refills | Status: AC

## 2024-01-04 MED ORDER — TELMISARTAN-HCTZ 80-12.5 MG PO TABS: 1.0000 | ORAL_TABLET | Freq: Every day | ORAL | 1 refills | Status: AC

## 2024-01-04 MED ORDER — IPRATROPIUM-ALBUTEROL 0.5-2.5 (3) MG/3ML IN SOLN: 3.0000 mL | RESPIRATORY_TRACT | 2 refills | Status: AC | PRN

## 2024-01-04 MED ORDER — PREDNISONE 20 MG PO TABS: ORAL_TABLET | ORAL | 0 refills | Status: DC

## 2024-01-04 MED ORDER — TRELEGY ELLIPTA 100-62.5-25 MCG/ACT IN AEPB: 1.0000 | INHALATION_SPRAY | Freq: Every day | RESPIRATORY_TRACT | 11 refills | Status: AC

## 2024-01-04 NOTE — Progress Notes (Addendum)
 HPI M former smoker followed for OSA, COPD (100 pkyrs, QS [id number]), Asbestos exposure, complicated by CHF/Pacemakert,, HTN, PAFib, COPD, HxTobacco Use, OSA, Diverticulosis, Osteoarthritis, ETOH, Anxiety/Depression, Hx Positive TB PPD,  -Ventolin  hfa, Breztri ,  NPSG [place] 09/10/15- AHI 44.3/ hr, desaturation to 83%, body weight 184 lbs, CPAP to 10 Inspire Hypoglossal Nerve Stimulator 01/05/22-Dr Carlie Russel Activation 02/09/22 HST(WatchPat/Itamar) with Inspire- 02/05/23- AHI 63, desat to 87%, body weight 192 lbs  On left side AHI 43/hr ===================================================================================================================   04/15/23-  67yoM ??Smoker(118 pk yrs) followed for OSA/ Inspire, COPD (100 pkyrs, QS [id number]), Asbestos exposure, complicated by CHF/Pacemaker, ASCVD, HTN, PAFib, CAD, HxTobacco Use, OSA, Diverticulosis, Osteoarthritis, ETOH, Anxiety/Depression, Hx Positive TB PPD,  He had installed and removed asbestos and he had worked in [employer], in addition to a long smoking history. -Ventolin  hfa, Breztri , Lunesta  3, CT chest low dose screen/ Asbestos exposure-   last on 06/26/22     Inspire Hypoglossal Nerve Stimulator placed 01/05/22-Dr Carlie Russel Activation 02/09/22 Inspire Sleep  Study Titration-05/18/22- HST(WatchPat/Itamar) with Inspire- AHI 63, desat to 87%, body weight 192 lbs   On left side AHI 43 CXR 01/08/23 Body weight today-189 lbs Still smoking against advice- discussed. Discussed the use of AI scribe software for clinical note transcription with the patient, who gave verbal consent to proceed. History of Present Illness   The patient, with a history of sleep apnea managed with an Inspire device, reports significant improvement in sleep quality. He now wakes up only once a night, compared to every two hours previously. The patient is satisfied with the current settings of the device (1.7V) but is open to increasing the voltage to  potentially further improve sleep quality. His previous sleep study did not show good apnea control at this voltage level, so we hope he can go higher.  In addition to sleep apnea, the patient experiences occasional dizzy spells. He attributes these episodes to changes in his blood pressure medication and occasional low blood pressure readings. The patient reports that these dizzy spells usually resolve by mid-morning.  The patient is an active  smoker . It is possible nicotine  withdrawal at night contributes to fragmented sleep. He was counseled again. The patient is also physically active, as evidenced by his ongoing home renovation project.    Assessment and Plan:    Obstructive Sleep Apnea Inspire device use improved sleep quality. Now setting voltage increase to 1.8V for further enhancement. No current discomfort. Potential for subconscious arousals with higher voltage. - Increase Inspire device voltage by one level and monitor changes in sleep quality. - Advise monitoring for increased nighttime awakenings or discomfort and adjust settings accordingly. - Schedule follow-up in six months to assess device efficacy and comfort.  Hypertension Blood pressure improved to 110/78 mmHg. Previous dizziness likely due to low blood pressure from medication changes. - Continue current antihypertensive regimen. - Monitor blood pressure regularly and report any dizziness.   Tobacco Abuse - Continue counseling and support to quit -Consider when to update imaging.      01/05/24- 68yoM ??Smoker(118 pk yrs) followed for   OSA/ Inspire, COPD (100 pkyrs, QS [id number]), Asbestos exposure,complicated by CHF/Pacemaker, ASCVD, HTN, PAFib, CAD, HxTobacco Use, OSA, Diverticulosis, Osteoarthritis, ETOH, Anxiety/Depression, Hx Positive TB PPD,  He had installed and removed asbestos and he had worked in [employer], in addition to a long smoking history. -Ventolin  hfa, Breztri , Lunesta  3, CT chest low dose  screen/ Asbestos exposure-   last on 06/26/22     Inspire Hypoglossal Nerve  Stimulator placed 01/05/22-Dr BatesOSA/ Inspire, COPD (100 pkyrs, QS [id number]), Asbestos exposure, Inspire Activation 02/09/22 Inspire Sleep  Study Titration-05/18/22- HST(WatchPat/Itamar) with Inspire- AHI 63, desat to 87%, body weight 192 lbs   On left side AHI 43 At LOV Inspire voltage was being increased to 1.8V. Discussed the use of AI scribe software for clinical note transcription with the patient, who gave verbal consent to proceed.  History of Present Illness   Trinton Prewitt is a 68 year old male Smoker with OSA/ Inspire, COPD who presents with shortness of breath for a month and a half.  He has had persistent shortness of breath for about six weeks without cough, chest pain, or infection symptoms. Home oximetry has shown readings as low as 87% at rest. He does not use supplemental oxygen.  He recently started Trelegy and is on a prednisone  taper of two tablets daily for four days followed by one tablet daily for four days. He has a home nebulizer with medication available. His PCP has changed Breztri  to Trelegy for trial.  He has a home nebulizer used 1-2x/ daily- discussed.  A low-dose screening CT chest at the end of May showed no new findings. He has prior asbestos exposure.   He continues using Inspire hypoglossal  stimulator as instructed and will return to follow up on this with his next sleep doctor here as I retire.       CT screeening CT 06/23/23 MPRESSION: 1. Lung-RADS 1, negative. Continue annual screening with low-dose chest CT without contrast in 12 months. 2. Aortic atherosclerosis (ICD10-I70.0). Coronary artery calcification. 3.  Emphysema (ICD10-J43.9). CXR 12/12/23 FINDINGS: Trachea is midline. Heart size normal. Right pacemaker lead tips are in the right atrium and right ventricle. Inspire device projects over the medial left chest. Lungs are clear. No pleural  fluid. Degenerative changes in the spine. IMPRESSION: No acute findings. CT chest 06/23/23 IMPRESSION: 1. Lung-RADS 1, negative. Continue annual screening with low-dose chest CT without contrast in 12 months. 2. Aortic atherosclerosis (ICD10-I70.0). Coronary artery calcification. 3.  Emphysema (ICD10-J43.9).   Assessment and Plan:    Chronic obstructive pulmonary disease with acute exacerbation COPD exacerbation with shortness of breath for 1.5 months. Oxygen saturation at 87% at rest. Recent switch from Breztri  to Trelegy inhaler. Prednisone  taper initiated. CT scan in May showed no new findings. No active asbestos exposure on CT. - Continue Trelegy inhaler once daily with mouth rinsing after use. - Complete prednisone  taper as prescribed. - Ordered replacement hose and mouthpiece for nebulizer machine. - Arranged for home care company to provide nebulizer supplies. - Ordered overnight oximeter to monitor oxygen levels. - Scheduled follow-up with lung specialist in a couple of months.  Hypoxemia Oxygen saturation at 87% at rest. No home oxygen in use. - Ordered overnight oximeter to monitor oxygen levels.   Tobacco user despite counseling   Asbestos exposure -Long term surveillance   OSA -Inspire -sleep hygiene - support and encouragement         ROS   see HPI    + = positive HEENT:    headaches, difficulty swallowing, tooth/dental problems, sore throat,       sneezing, itching, ear ache, nasal congestion, post nasal drip, snoring CV:    chest pain, orthopnea, PND, swelling in lower extremities, anasarca,                                   dizziness, palpitations  Resp:   +shortness of breath with exertion or at rest.                productive cough,   non-productive cough, coughing up of blood.              change in color of mucus.  wheezing.   Skin:    rash or lesions. GI:  +heartburn, indigestion, abdominal pain, nausea, vomiting, diarrhea,                 change  in bowel habits, +loss of appetite GU: dysuria, change in color of urine, no urgency or frequency.   flank pain. MS:   joint pain, stiffness, decreased range of motion, back pain. Neuro-     nothing unusual Psych:  change in mood or affect.  depression or anxiety.   memory loss.  OBJ- Physical Exam   +obese General- Alert, Oriented, Affect-appropriate, Distress- none Skin- rash-none, lesions- none, excoriation- none Lymphadenopathy- none Head- atraumatic            Eyes- Gross vision intact, PERRLA, conjunctivae and secretions clear            Ears- Hearing, canals-normal            Nose- Clear, no-Septal dev, mucus, polyps, erosion, perforation             Throat- Mallampati III , mucosa clear , drainage- none, tonsils- atrophic Neck- flexible , trachea midline, no stridor , thyroid  nl, carotid no bruit Chest - symmetrical excursion , unlabored           Heart/CV- RRR , no murmur , no gallop  , no rub, nl s1 s2                           - JVD- none , edema- none, stasis changes- none, varices- none           Lung- +  no rhonchi, no distinct crackle,  wheeze-none, cough+light , dullness-none, rub- none           Chest wall- +pacemaker on right. + Inspire on left Abd-  Br/ Gen/ Rectal- Not done, not indicated Extrem- + nail spooning Neuro- grossly intact to observation

## 2024-01-04 NOTE — Telephone Encounter (Signed)
 Spoke with pt asking about inhaler use. Pt confirms he's taking Breztri  daily, 2 puffs BID. Also, states he has albuterol  rescue inhaler and that Dr Watt sent in a new rx due to current albuterol  soon to expire.

## 2024-01-04 NOTE — Telephone Encounter (Signed)
 Thank you. Yes, I spoke with Dr. Watt this afternoon.

## 2024-01-05 ENCOUNTER — Ambulatory Visit: Admitting: Internal Medicine

## 2024-01-05 ENCOUNTER — Other Ambulatory Visit: Payer: Self-pay | Admitting: Primary Care

## 2024-01-05 ENCOUNTER — Encounter: Payer: Self-pay | Admitting: Internal Medicine

## 2024-01-05 VITALS — BP 124/78 | HR 74 | Temp 97.3°F | Ht 65.0 in | Wt 185.4 lb

## 2024-01-05 DIAGNOSIS — G4733 Obstructive sleep apnea (adult) (pediatric): Secondary | ICD-10-CM

## 2024-01-05 DIAGNOSIS — J441 Chronic obstructive pulmonary disease with (acute) exacerbation: Secondary | ICD-10-CM

## 2024-01-05 DIAGNOSIS — F17210 Nicotine dependence, cigarettes, uncomplicated: Secondary | ICD-10-CM | POA: Diagnosis not present

## 2024-01-05 DIAGNOSIS — G4734 Idiopathic sleep related nonobstructive alveolar hypoventilation: Secondary | ICD-10-CM

## 2024-01-05 DIAGNOSIS — J449 Chronic obstructive pulmonary disease, unspecified: Secondary | ICD-10-CM

## 2024-01-05 DIAGNOSIS — Z77090 Contact with and (suspected) exposure to asbestos: Secondary | ICD-10-CM | POA: Diagnosis not present

## 2024-01-05 DIAGNOSIS — K219 Gastro-esophageal reflux disease without esophagitis: Secondary | ICD-10-CM

## 2024-01-05 DIAGNOSIS — R0902 Hypoxemia: Secondary | ICD-10-CM

## 2024-01-05 NOTE — Patient Instructions (Addendum)
 Continue Inspire for sleep apnea. We will get you back with an Mining engineer.  [facility] ( lives in [place])   hose, supplies for nebulizer machine.  Dx COPD mixed type.   DuoNeb 360 ml, ref x 12,   1 neb every 6 hours if needed  Order- schedule overnight oximetry on room air    dx Nocturnal hypoxemia   Please call if we can help

## 2024-01-10 ENCOUNTER — Encounter: Payer: Self-pay | Admitting: Dermatology

## 2024-01-10 ENCOUNTER — Ambulatory Visit: Admitting: Dermatology

## 2024-01-10 VITALS — BP 131/85 | HR 62 | Temp 97.9°F

## 2024-01-10 DIAGNOSIS — C4491 Basal cell carcinoma of skin, unspecified: Secondary | ICD-10-CM

## 2024-01-10 DIAGNOSIS — L814 Other melanin hyperpigmentation: Secondary | ICD-10-CM

## 2024-01-10 DIAGNOSIS — C44311 Basal cell carcinoma of skin of nose: Secondary | ICD-10-CM

## 2024-01-10 DIAGNOSIS — L578 Other skin changes due to chronic exposure to nonionizing radiation: Secondary | ICD-10-CM

## 2024-01-10 MED ORDER — MUPIROCIN 2 % EX OINT: 1.0000 | TOPICAL_OINTMENT | Freq: Two times a day (BID) | CUTANEOUS | 1 refills | Status: AC

## 2024-01-10 NOTE — Telephone Encounter (Signed)
 Error

## 2024-01-10 NOTE — Progress Notes (Signed)
 Follow-Up Visit   Subjective  Brandon Lopez is a 68 y.o. 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 Raymund.  The following portions of the chart were reviewed this encounter and updated as appropriate: medications, allergies, medical history  Review of Systems:  No other skin or systemic complaints except as noted in HPI or Assessment and Plan.  Objective  Well appearing patient in no apparent distress; mood and affect are within normal limits.  A focused examination was performed of the following areas: Nasal tip Relevant physical exam findings are noted in the Assessment and Plan.   nasal tip Biopsy scar   Assessment & Plan   BASAL CELL CARCINOMA (BCC), UNSPECIFIED SITE nasal tip - Mohs surgery   Anticoagulation: Was the anticoagulation regimen changed prior to Mohs? No    Anesthesia: Anesthesia method: local infiltration Local anesthetic: lidocaine  1% WITH epi  Procedure Details: Timeout: pre-procedure verification complete Procedure Prep: patient was prepped and draped in usual sterile fashion Prep type: chlorhexidine Biopsy accession number: [id number] Pre-Op diagnosis: basal cell carcinoma BCC subtype: nodular and infiltrative MohsAIQ Surgical site (if tumor spans multiple areas, please select predominant area): nose Surgery side: midline Surgical site (from skin exam): nasal tip Pre-operative length (cm): 0.6 Pre-operative width (cm): 0.7 Indications for Mohs surgery: anatomic location where tissue conservation is critical  Micrographic Surgery Details: Post-operative length (cm): 1 Post-operative width (cm): 1 Number of Mohs stages: 1  Stage 1    Tumor features identified on Mohs section: no tumor identified    Depth of tumor invasion after stage: skeletal muscle  Reconstruction: Was the defect reconstructed? Yes   Was reconstruction performed by the  same Mohs surgeon? Yes   Setting of reconstruction: outpatient office When was reconstruction performed? same day Type of reconstruction: linear Linear reconstruction: complex  - Skin repair Complexity:  Complex Final length (cm):  4 Informed consent: discussed and consent obtained   Timeout: patient name, date of birth, surgical site, and procedure verified   Procedure prep:  Patient was prepped and draped in usual sterile fashion Prep type:  Chlorhexidine Anesthesia: the lesion was anesthetized in a standard fashion   Anesthetic:  1% lidocaine  w/ epinephrine  1-100,000 buffered w/ 8.4% NaHCO3 Reason for type of repair: reduce tension to allow closure, allow closure of the large defect, preserve normal anatomy and avoid adjacent structures   Undermining: area extensively undermined   Subcutaneous layers (deep stitches):  Suture size:  5-0 Suture type: Vicryl (polyglactin 910)   Stitches:  Buried vertical mattress Fine/surface layer approximation (top stitches):  Suture size:  6-0 Suture type: fast-absorbing plain gut   Stitches: simple running   Hemostasis achieved with: suture, pressure and electrodesiccation Outcome: patient tolerated procedure well with no complications   Post-procedure details: sterile dressing applied and wound care instructions given   Dressing type: bandage and pressure dressing      Return in about 4 weeks (around 02/07/2024) for wound check.  [name], CMA, am acting as scribe for [name], MD.    01/10/2024  HISTORY OF PRESENT ILLNESS  Brandon Lopez is seen in consultation at the request of Dr. 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 They note that the area has been present for about 1 year increasing in size with time.  There is no history of previous treatment.  Reports no other new or  changing lesions and has no other complaints today.  Medications and allergies: see patient  chart.  Review of systems: Reviewed 8 systems and notable for the above skin cancer.  All other systems reviewed are unremarkable/negative, unless noted in the HPI. Past medical history, surgical history, family history, social history were also reviewed and are noted in the chart/questionnaire.    PHYSICAL EXAMINATION  General: Well-appearing, in no acute distress, alert and oriented x 4. Vitals reviewed in chart (if available).   Skin: Exam reveals a 0.6 x 0.7 cm erythematous papule and biopsy scar on the nasal tip. There are rhytids, telangiectasias, and lentigines, consistent with photodamage.  Biopsy report(s) reviewed, confirming the diagnosis.   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 Due to location, size, histology, or recurrence and the likelihood of subclinical extension as well as the need to conserve normal surrounding tissue, the patient was deemed acceptable for Mohs micrographic surgery (MMS).  The nature and purpose of the procedure, associated benefits and risks including recurrence and scarring, possible complications such as pain, infection, and bleeding, and alternative methods of treatment if appropriate were discussed with the patient during consent. The lesion location was verified by the patient, by reviewing previous notes, pathology reports, and by photographs as well as angulation measurements if available.  Informed consent was reviewed and signed by the patient, and timeout was performed at 8:30 AM. See op note below.  2. For the photodamage and solar lentigines, sun protection discussed/information given on OTC sunscreens, and we recommend continued regular follow-up with primary dermatologist every 6 months or sooner for any growing, bleeding, or changing lesions. 3. Prognosis and future surveillance discussed. 4. Letter with treatment outcome sent to referring provider. 5.  Pain acetaminophen /ibuprofen  MOHS MICROGRAPHIC SURGERY AND RECONSTRUCTION  Initial size:   0.6 x 0.7 cm Surgical defect/wound size: 1.0 x 1.0 cm Anesthesia:    0.33% lidocaine  with 1:200,000 epinephrine  EBL:    <5 mL Complications:  None Repair type:   Complex SQ suture:   5-0 Vicryl Cutaneous suture:  6-0 Plain gut Final size of the repair: 4.0 cm  Stages: 1  STAGE I: Anesthesia achieved with 0.5% lidocaine  with 1:200,000 epinephrine . ChloraPrep applied. 1 section(s) excised using Mohs technique (this includes total peripheral and deep tissue margin excision and evaluation with frozen sections, excised and interpreted by the same physician). The tumor was first debulked and then excised with an approx. 2mm margin.  Hemostasis was achieved with electrocautery as needed.  The specimen was then oriented, subdivided/relaxed, inked, and processed using Mohs technique.    Frozen section analysis revealed a clear deep and peripheral margin.  Reconstruction  The surgical wound was then cleaned, prepped, and re-anesthetized as above. Wound edges were undermined extensively along at least one entire edge and at a distance equal to or greater than the width of the defect (see wound defect size above) in order to achieve closure and decrease wound tension and anatomic distortion. Redundant tissue repair including standing cone removal was performed. Hemostasis was achieved with electrocautery. Subcutaneous and epidermal tissues were approximated with the above sutures. The surgical site was then lightly scrubbed with sterile, saline-soaked gauze. The area was then bandaged using Vaseline ointment, non-adherent gauze, gauze pads, and tape to provide an adequate pressure dressing. The patient tolerated the procedure well, was given detailed written and verbal wound care instructions, and was discharged in good  condition.   The patient will follow-up: 4 weeks.    Documentation: I have reviewed the  above documentation for accuracy and completeness, and I agree with the above.  [name], MD

## 2024-01-10 NOTE — Patient Instructions (Signed)

## 2024-01-10 NOTE — Telephone Encounter (Signed)
 Pt is scheduled for CT Maxillofacial on 12/22 with [facility]

## 2024-01-13 ENCOUNTER — Encounter: Payer: Self-pay | Admitting: Internal Medicine

## 2024-01-13 ENCOUNTER — Encounter: Payer: Self-pay | Admitting: Physician Assistant

## 2024-01-13 ENCOUNTER — Ambulatory Visit: Admitting: Physician Assistant

## 2024-01-13 DIAGNOSIS — F331 Major depressive disorder, recurrent, moderate: Secondary | ICD-10-CM

## 2024-01-13 DIAGNOSIS — F411 Generalized anxiety disorder: Secondary | ICD-10-CM

## 2024-01-13 DIAGNOSIS — F1090 Alcohol use, unspecified, uncomplicated: Secondary | ICD-10-CM | POA: Diagnosis not present

## 2024-01-13 DIAGNOSIS — F99 Mental disorder, not otherwise specified: Secondary | ICD-10-CM

## 2024-01-13 DIAGNOSIS — F5105 Insomnia due to other mental disorder: Secondary | ICD-10-CM | POA: Diagnosis not present

## 2024-01-13 MED ORDER — DULOXETINE HCL 30 MG PO CPEP: 30.0000 mg | ORAL_CAPSULE | Freq: Every day | ORAL | 1 refills | Status: DC

## 2024-01-13 MED ORDER — DULOXETINE HCL 60 MG PO CPEP: 60.0000 mg | ORAL_CAPSULE | Freq: Every day | ORAL | 1 refills | Status: AC

## 2024-01-13 MED ORDER — HYDROXYZINE HCL 10 MG PO TABS: 10.0000 mg | ORAL_TABLET | Freq: Three times a day (TID) | ORAL | 1 refills | Status: AC | PRN

## 2024-01-13 NOTE — Progress Notes (Signed)
 "          [facility] Med Check  Patient ID: Brandon Lopez,  MRN: [id number]  PCP: Gretta Comer POUR, NP  Date of Evaluation: 01/13/2024 Time spent:20 minutes  Chief Complaint:  Chief Complaint   Depression    HISTORY/CURRENT STATUS: HPI For 6 week med check.  Had a rxn to Abilify .  SOB, hives, tongue swelling, couldn't pick up his feet well. After stopping it, he felt much better and those sx went away. He was feeling some better emotionally so it's disappointing that he had to go off it.   He doesn't feel like doing much of anything.   Energy and motivation are fair.  No extreme sadness, tearfulness, or feelings of hopelessness. ADLs and personal hygiene are normal.   Denies any changes in concentration, making decisions, or remembering things.  Appetite has not changed.  Weight is stable.  Sleep is hit or miss. Still drinking about the same amount of ETOH. No mania, delirium, AH/VH.  No SI/HI.  Individual Medical History/ Review of Systems: Changes? :Yes    had skin cancer on nose removed since LOV  Past medications for mental health diagnoses include: He's been on many different antidepressants, remembers Wellbutrin  for smoking cessation. Helped depression a little but pooped out.  Prozac , Zoloft , Effexor . Chantix  Gabapentin  Lunesta  Trazodone  Abilify  caused SOB, tongue swelling, clenching his jaw.    Went to [facility] years ago for counseling and med management.  Never hosp for mental health reasons  Allergies: Abilify  [aripiprazole ], Simvastatin, and Lisinopril   Current Medications:  Current Outpatient Medications:    albuterol  (VENTOLIN  HFA) 108 (90 Base) MCG/ACT inhaler, INHALE 2 PUFFS BY MOUTH EVERY 6 HOURS AS NEEDED FOR WHEEZING AND FOR SHORTNESS OF BREATH, Disp: 18 g, Rfl: 1   aspirin  EC 81 MG tablet, Take 81 mg by mouth daily. Swallow whole., Disp: , Rfl:    atorvastatin  (LIPITOR) 20 MG tablet, TAKE 1 TABLET BY MOUTH ONCE DAILY FOR CHOLESTEROL, Disp: 90  tablet, Rfl: 0   carvedilol  (COREG ) 3.125 MG tablet, Take 1 tablet (3.125 mg total) by mouth 2 (two) times daily., Disp: 180 tablet, Rfl: 1   Cholecalciferol (VITAMIN D3) 1.25 MG (50000 UT) CAPS, Take 1 capsule (1.25 mg total) by mouth once a week., Disp: 12 capsule, Rfl: 0   cyanocobalamin  (VITAMIN B12) 1000 MCG tablet, Take 1 tablet (1,000 mcg total) by mouth daily., Disp: 30 tablet, Rfl: 11   DULoxetine  (CYMBALTA ) 30 MG capsule, Take 1 capsule (30 mg total) by mouth daily., Disp: 14 capsule, Rfl: 1   DULoxetine  (CYMBALTA ) 60 MG capsule, Take 1 capsule (60 mg total) by mouth daily. Begin after the 2 weeks of 30 mg., Disp: 30 capsule, Rfl: 1   EPINEPHrine  0.3 mg/0.3 mL IJ SOAJ injection, Inject 0.3 mg into the muscle as needed for anaphylaxis., Disp: 1 each, Rfl: 0   ezetimibe  (ZETIA ) 10 MG tablet, Take 1 tablet by mouth once daily, Disp: 90 tablet, Rfl: 3   Fluticasone -Umeclidin-Vilant (TRELEGY ELLIPTA ) 100-62.5-25 MCG/ACT AEPB, Inhale 1 puff into the lungs daily., Disp: 1 each, Rfl: 11   hydrOXYzine  (ATARAX ) 10 MG tablet, Take 1-3 tablets (10-30 mg total) by mouth 3 (three) times daily as needed., Disp: 60 tablet, Rfl: 1   ipratropium-albuterol  (DUONEB) 0.5-2.5 (3) MG/3ML SOLN, Take 3 mLs by nebulization every 4 (four) hours as needed., Disp: 360 mL, Rfl: 2   mupirocin  ointment (BACTROBAN ) 2 %, Apply 1 Application topically 2 (two) times daily., Disp: 22 g, Rfl: 1  pantoprazole  (PROTONIX ) 20 MG tablet, TAKE 1 TABLET BY MOUTH ONCE DAILY FOR  HEARTBURN, Disp: 90 tablet, Rfl: 0   tadalafil  (CIALIS ) 5 MG tablet, TAKE 1 TABLET BY MOUTH ONCE DAILY FOR FREQUENT URINATION, Disp: 90 tablet, Rfl: 0   telmisartan -hydrochlorothiazide  (MICARDIS  HCT) 80-12.5 MG tablet, Take 1 tablet by mouth daily., Disp: 90 tablet, Rfl: 1   predniSONE  (DELTASONE ) 20 MG tablet, 2 tabs po for 4 days, then 1 tab po for 4 days, Disp: 12 tablet, Rfl: 0 Medication Side Effects: none  Family Medical/ Social History: Changes?  No  MENTAL HEALTH EXAM:  There were no vitals taken for this visit.There is no height or weight on file to calculate BMI.  General Appearance: Casual and Well Groomed  Eye Contact:  Good  Speech:  Clear and Coherent and Normal Rate  Volume:  Normal  Mood:  sad  Affect:  Congruent  Thought Process:  Goal Directed and Descriptions of Associations: Circumstantial  Orientation:  Full (Time, Place, and Person)  Thought Content: Logical   Suicidal Thoughts:  No  Homicidal Thoughts:  No  Memory:  WNL  Judgement:  Good  Insight:  Good  Psychomotor Activity:  Normal  Concentration:  Concentration: Good  Recall:  Good  Fund of Knowledge: Good  Language: Good  Assets:  Communication Skills Desire for Improvement Financial Resources/Insurance Housing Transportation  ADL's:  Intact  Cognition: WNL  Prognosis:  Good   DIAGNOSES:    ICD-10-CM   1. Major depressive disorder, recurrent episode, moderate (HCC)  F33.1     2. Generalized anxiety disorder  F41.1     3. Insomnia due to other mental disorder  F51.05    F99     4. Alcohol use disorder  F10.90      Receiving Psychotherapy: Yes  Arvin Herald  RECOMMENDATIONS:   PDMP reviewed.  Librium  filled 08/16/2023.  Lunesta  filled on 02/18/2023.  I provided approximately 20  minutes of face to face time during this encounter, including time spent before and after the visit in records review, medical decision making, counseling pertinent to today's visit, and charting.   Again discussed ETOH cessation. He'll let me know if/when he wants help to quit.  Not ready at this point.   Discussed the depression and other tx options. He's not sure if he's tried Cymbalta . Benefits, risks, SE were explained and he'd like to try it.   Start Cymbalta  30 mg daily for 2 weeks then increase to 60 mg daily. Start hydroxyzine  10 mg,1-3 tid prn. Continue therapy with Leann Yates.  Return in 6 weeks.    Verneita Cooks, PA-C          "

## 2024-01-15 ENCOUNTER — Other Ambulatory Visit: Payer: Self-pay | Admitting: Primary Care

## 2024-01-15 DIAGNOSIS — K219 Gastro-esophageal reflux disease without esophagitis: Secondary | ICD-10-CM

## 2024-01-16 ENCOUNTER — Ambulatory Visit
Admission: RE | Admit: 2024-01-16 | Discharge: 2024-01-16 | Disposition: A | Discharge status: Home / Self Care | Source: Ambulatory Visit | Attending: Family | Admitting: Family

## 2024-01-16 DIAGNOSIS — M26622 Arthralgia of left temporomandibular joint: Secondary | ICD-10-CM | POA: Diagnosis present

## 2024-01-16 DIAGNOSIS — R6884 Jaw pain: Secondary | ICD-10-CM | POA: Diagnosis present

## 2024-01-23 ENCOUNTER — Other Ambulatory Visit: Payer: Self-pay | Admitting: Primary Care

## 2024-01-23 DIAGNOSIS — I10 Essential (primary) hypertension: Secondary | ICD-10-CM

## 2024-01-27 ENCOUNTER — Encounter: Payer: Self-pay | Admitting: Primary Care

## 2024-01-27 ENCOUNTER — Ambulatory Visit: Payer: Self-pay | Admitting: Family

## 2024-01-27 ENCOUNTER — Ambulatory Visit: Payer: Self-pay | Admitting: Primary Care

## 2024-01-27 ENCOUNTER — Ambulatory Visit: Admitting: Primary Care

## 2024-01-27 VITALS — BP 150/102 | HR 75 | Temp 97.6°F | Ht 60.5 in | Wt 185.4 lb

## 2024-01-27 DIAGNOSIS — R3912 Poor urinary stream: Secondary | ICD-10-CM

## 2024-01-27 DIAGNOSIS — G4733 Obstructive sleep apnea (adult) (pediatric): Secondary | ICD-10-CM | POA: Diagnosis not present

## 2024-01-27 DIAGNOSIS — J449 Chronic obstructive pulmonary disease, unspecified: Secondary | ICD-10-CM

## 2024-01-27 DIAGNOSIS — Z0000 Encounter for general adult medical examination without abnormal findings: Secondary | ICD-10-CM

## 2024-01-27 DIAGNOSIS — K219 Gastro-esophageal reflux disease without esophagitis: Secondary | ICD-10-CM

## 2024-01-27 DIAGNOSIS — Z125 Encounter for screening for malignant neoplasm of prostate: Secondary | ICD-10-CM

## 2024-01-27 DIAGNOSIS — Z23 Encounter for immunization: Secondary | ICD-10-CM | POA: Diagnosis not present

## 2024-01-27 DIAGNOSIS — I10 Essential (primary) hypertension: Secondary | ICD-10-CM | POA: Diagnosis not present

## 2024-01-27 DIAGNOSIS — I480 Paroxysmal atrial fibrillation: Secondary | ICD-10-CM

## 2024-01-27 DIAGNOSIS — E559 Vitamin D deficiency, unspecified: Secondary | ICD-10-CM | POA: Diagnosis not present

## 2024-01-27 DIAGNOSIS — E782 Mixed hyperlipidemia: Secondary | ICD-10-CM

## 2024-01-27 DIAGNOSIS — F331 Major depressive disorder, recurrent, moderate: Secondary | ICD-10-CM | POA: Diagnosis not present

## 2024-01-27 DIAGNOSIS — Z1211 Encounter for screening for malignant neoplasm of colon: Secondary | ICD-10-CM

## 2024-01-27 LAB — LIPID PANEL
Cholesterol: 147 mg/dL (ref 28–200)
HDL: 39 mg/dL — ABNORMAL LOW
LDL Cholesterol: 76 mg/dL (ref 10–99)
NonHDL: 107.73
Total CHOL/HDL Ratio: 4
Triglycerides: 159 mg/dL — ABNORMAL HIGH (ref 10.0–149.0)
VLDL: 31.8 mg/dL (ref 0.0–40.0)

## 2024-01-27 LAB — VITAMIN D 25 HYDROXY (VIT D DEFICIENCY, FRACTURES): VITD: 47.26 ng/mL (ref 30.00–100.00)

## 2024-01-27 LAB — PSA: PSA: 0.38 ng/mL (ref 0.10–4.00)

## 2024-01-27 NOTE — Assessment & Plan Note (Addendum)
 Repeat lipid panel pending.  Work on a healthy diet and regular exercise in order for weight loss, and to reduce the risk of further co-morbidity. Continue atorvastatin  20 mg daily, Zetia  10 mg daily

## 2024-01-27 NOTE — Assessment & Plan Note (Signed)
Controlled.  Continue tadalafil 5 mg daily.

## 2024-01-27 NOTE — Patient Instructions (Signed)
Stop by the lab prior to leaving today. I will notify you of your results once received.   You will either be contacted via phone regarding your referral to GI for the colonoscopy, or you may receive a letter on your MyChart portal from our referral team with instructions for scheduling an appointment. Please let us know if you have not been contacted by anyone within two weeks.  It was a pleasure to see you today!   

## 2024-01-27 NOTE — Progress Notes (Addendum)
 "  Subjective:    Patient ID: Brandon Lopez, male    DOB: May 06, 1955, 69 y.o.   MRN: [id number]  Brandon Lopez is a very pleasant 69 y.o. male who presents today for complete physical and follow up of chronic conditions.  Immunizations: -Tetanus: Completed in 2023 -Influenza: Due -Shingles: Completed Shingrix x 1 dose -Pneumonia: Completed last in 2022  Diet: Fair diet.  Exercise: No regular exercise.  Eye exam: Completes annually  Dental exam: Completes semi-annually    Colonoscopy: Completed in 2016, due 2026 Lung Cancer Screening: Completed in May 2025  PSA: Due   BP Readings from Last 3 Encounters:  01/27/24 (!) 150/102  01/10/24 131/85  01/05/24 124/78        Review of Systems  Constitutional:  Negative for unexpected weight change.  HENT:  Negative for rhinorrhea.   Respiratory:  Negative for cough and shortness of breath.   Cardiovascular:  Negative for chest pain.  Gastrointestinal:  Negative for constipation and diarrhea.  Genitourinary:  Negative for difficulty urinating.  Musculoskeletal:  Negative for arthralgias and myalgias.  Skin:  Negative for rash.  Allergic/Immunologic: Negative for environmental allergies.  Neurological:  Negative for dizziness and headaches.  Psychiatric/Behavioral:         Depression, follows with psychiatry.          Past Medical History:  Diagnosis Date   Actinic keratosis    Arthritis    BCC (basal cell carcinoma) 11/30/2023   nasal tip, Mohs 01/10/24 with Dr. Corey   Chronic systolic CHF (congestive heart failure) (HCC)    a. echo 6/16: EF 55-60%, Gr1DD, LA nl, PASP nl; b. 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 short episodes previously noted on PPM interrogation; b. started on Xarelto  07/2017 given further episodes of Afib noted during interrogation; c. 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 Biotronik; Jeanett DR-T [id number]; placed 08/2012   Rectal bleeding 09/19/2018   Shortness of breath 04/21/2021   Skin lesions 07/03/2020   Sleep apnea    does not use CPAP   Symptomatic bradycardia    a. s/p Biotronik; Jeanett DR-T [id number]; placed 08/2012 at [facility]   Syncope 08/10/2017    Social History   Socioeconomic History   Marital status: Single    Spouse name: Not on file   Number of children: Not on file   Years of education: Not on file   Highest education level: GED or equivalent  Occupational History   Not on file  Tobacco Use   Smoking status: Every Day    Current packs/day: 0.00    Average packs/day: 2.0 packs/day for 59.0 years (118.0 ttl pk-yrs)    Types: Cigarettes    Start date: 1964  Last attempt to quit: 02/01/2021    Years since quitting: 2.9   Smokeless tobacco: Never   Tobacco comments:    Smoking 1 pack   Vaping Use   Vaping status: Never Used  Substance and Sexual Activity   Alcohol use: Yes    Alcohol/week: 14.0 standard drinks of alcohol    Types: 14 Standard drinks or equivalent per week   Drug use: Yes    Types: Marijuana    Comment: states smokes daily, last 11-09-21   Sexual activity: Yes  Other Topics Concern   Not on file  Social History Narrative   Not on file   Social Drivers of Health   Tobacco Use: High Risk (01/27/2024)   Patient History     Smoking Tobacco Use: Every Day    Smokeless Tobacco Use: Never    Passive Exposure: Not on file  Financial Resource Strain: Medium Risk (08/02/2023)   Overall Financial Resource Strain (CARDIA)    Difficulty of Paying Living Expenses: Somewhat hard  Food Insecurity: Food Insecurity Present (08/02/2023)   Epic    Worried About Programme Researcher, Broadcasting/film/video in the Last Year: Sometimes true    Ran Out of Food in the Last Year: Sometimes true  Transportation Needs: No Transportation Needs (08/02/2023)   Epic    Lack of Transportation (Medical): No    Lack of Transportation (Non-Medical): No  Physical Activity: Insufficiently Active (08/02/2023)   Exercise Vital Sign    Days of Exercise per Week: 5 days    Minutes of Exercise per Session: 20 min  Stress: Stress Concern Present (08/02/2023)   [employer] of Occupational Health - Occupational Stress Questionnaire    Feeling of Stress: Very much  Social Connections: Unknown (01/23/2024)   Social Connection and Isolation Panel    Frequency of Communication with Friends and Family: Not on file    Frequency of Social Gatherings with Friends and Family: Not on file    Attends Religious Services: Not on file    Active Member of Clubs or Organizations: No    Attends Banker Meetings: Not on file    Marital Status: Not on file  Intimate Partner Violence: Not At Risk (08/02/2023)   Epic    Fear of Current or Ex-Partner: No    Emotionally Abused: No    Physically Abused: No    Sexually Abused: No  Depression (PHQ2-9): High Risk (01/27/2024)   Depression (PHQ2-9)    PHQ-2 Score: 16  Alcohol Screen: Low Risk (08/02/2023)   Alcohol Screen    Last Alcohol Screening Score (AUDIT): 3  Recent Concern: Alcohol Screen - Medium Risk (07/10/2023)   Alcohol Screen    Last Alcohol Screening Score (AUDIT): 10  Housing: Low Risk (08/02/2023)   Epic    Unable to Pay for Housing in the Last Year: No    Number of Times Moved in the Last Year: 0    Homeless in  the Last Year: No  Utilities: Not At Risk (08/02/2023)   Epic    Threatened with loss of utilities: No  Health Literacy: Adequate Health Literacy (08/02/2023)   B1300 Health Literacy    Frequency of need for help with medical instructions: Never    Past Surgical History:  Procedure Laterality Date   APPENDECTOMY  age 10   CARDIAC CATHETERIZATION     7 years ago   COLONOSCOPY N/A 05/27/2014   Procedure: COLONOSCOPY;  Surgeon: Deward CINDERELLA Piedmont, MD;  Location[facility];  Service: Gastroenterology;  Laterality: N/A;   DRUG INDUCED ENDOSCOPY Bilateral 11/11/2021   Procedure: DRUG INDUCED ENDOSCOPY;  Surgeon: Carlie Nadalee Neiswender, MD;  Location: [facility];  Service: ENT;  Laterality: Bilateral;   FLOOR OF MOUTH BIOPSY N/A 01/05/2022   Procedure: BIOPSY OF PALATE;  Surgeon: Carlie Ricci Dirocco, MD;  Location: [facility];  Service: ENT;  Laterality: N/A;   IMPLANTATION OF HYPOGLOSSAL NERVE STIMULATOR Left 01/05/2022   Procedure: IMPLANTATION OF HYPOGLOSSAL NERVE STIMULATOR;  Surgeon: Carlie Cordelia Bessinger, MD;  Location: [facility];  Service: ENT;  Laterality: Left;   INSERT / REPLACE / REMOVE PACEMAKER     3 years ago    Family History  Problem Relation Age of Onset   Arthritis Mother    Heart disease Father    Hyperlipidemia Father    Hypertension Father    Cancer Paternal Grandfather    Hyperlipidemia Brother     Allergies[1]  Medications Ordered Prior to Encounter[2]  BP (!) 150/102   Pulse 75   Temp 97.6 F (36.4 C) (Oral)   Ht 5' 0.5 (1.537 m)   Wt 185 lb 6 oz (84.1 kg)   SpO2 96%   BMI 35.61 kg/m  Objective:   Physical Exam HENT:     Right Ear: Tympanic membrane and ear canal normal.     Left Ear: Tympanic membrane and ear canal normal.  Eyes:     Pupils: Pupils are equal, round, and reactive to light.  Cardiovascular:     Rate and Rhythm: Normal rate and regular rhythm.  Pulmonary:     Effort: Pulmonary effort is normal.     Breath  sounds: Normal breath sounds.  Abdominal:     General: Bowel sounds are normal.     Palpations: Abdomen is soft.     Tenderness: There is no abdominal tenderness.  Musculoskeletal:        General: Normal range of motion.     Cervical back: Neck supple.  Skin:    General: Skin is warm and dry.  Neurological:     Mental Status: He is alert and oriented to person, place, and time.     Cranial Nerves: No cranial nerve deficit.     Deep Tendon Reflexes:     Reflex Scores:      Patellar reflexes are 2+ on the right side and 2+ on the left side. Psychiatric:        Mood and Affect: Mood normal.     Physical Exam        Assessment & Plan:  Preventative health care Assessment & Plan: Immunizations UTD. Influenza vaccine provided today.  Colonoscopy due in May 2026 Lung cancer screening up-to-date PSA due and pending.  Discussed the importance of a healthy diet and regular exercise in order for weight loss, and to reduce the risk of further co-morbidity.  Exam stable. Labs pending.  Follow up in 1 year for repeat physical.    Paroxysmal atrial fibrillation (HCC) Assessment & Plan: Rate and rhythm regular today.  Following with cardiology, office notes reviewed from October 2025. Continue aspirin  81 mg daily. Pacemaker in place.    Screening for colon cancer -     Ambulatory referral to Gastroenterology  Vitamin D  deficiency -     VITAMIN D  25 Hydroxy (Vit-D Deficiency, Fractures)  Screening for prostate cancer -     PSA  Mixed hyperlipidemia Assessment & Plan: Repeat lipid panel pending.  Work on a healthy diet and regular exercise in order  for weight loss, and to reduce the risk of further co-morbidity. Continue atorvastatin  20 mg daily, Zetia  10 mg daily  Orders: -     Lipid panel  Essential hypertension Assessment & Plan: Historically controlled  Continue carvedilol  3.125 mg twice daily, telmisartan -hydrochlorothiazide  80-12.5 mg daily. BMP  reviewed from November 2025.   OSA (obstructive sleep apnea) Assessment & Plan: Inspire in place.  Following with pulmonology, office notes reviewed from December 2025.   COPD mixed type [facility]) Assessment & Plan: No concerns today.  Following with pulmonology, office notes reviewed from December 2025. Continue Trelegy inhaler at 1 puff daily, albuterol  inhaler as needed, duo nebulizer treatments as needed.   Gastroesophageal reflux disease, unspecified whether esophagitis present Assessment & Plan: Controlled.  Continue pantoprazole  20 mg daily.   Weak urinary stream Assessment & Plan: Controlled.  Continue tadalafil  5 mg daily.   Encounter for immunization -     Flu vaccine HIGH DOSE PF(Fluzone Trivalent)  MDD (major depressive disorder), recurrent episode, moderate (HCC) Assessment & Plan: Ongoing.  Following with psychiatry, office notes reviewed from December 2025. Continue Cymbalta  and titrate upward as instructed to 60 mg daily when appropriate. Continue with therapy.     Assessment and Plan Assessment & Plan         Comer MARLA Gaskins, NP       [1]  Allergies Allergen Reactions   Abilify  [Aripiprazole ] Shortness Of Breath    Tongue swelling   Simvastatin Diarrhea   Lisinopril  Cough  [2]  Current Outpatient Medications on File Prior to Visit  Medication Sig Dispense Refill   albuterol  (VENTOLIN  HFA) 108 (90 Base) MCG/ACT inhaler INHALE 2 PUFFS BY MOUTH EVERY 6 HOURS AS NEEDED FOR WHEEZING AND FOR SHORTNESS OF BREATH 18 g 1   aspirin  EC 81 MG tablet Take 81 mg by mouth daily. Swallow whole.     atorvastatin  (LIPITOR) 20 MG tablet TAKE 1 TABLET BY MOUTH ONCE DAILY FOR CHOLESTEROL 90 tablet 0   carvedilol  (COREG ) 3.125 MG tablet Take 1 tablet (3.125 mg total) by mouth 2 (two) times daily. 180 tablet 1   Cholecalciferol (VITAMIN D3) 1.25 MG (50000 UT) CAPS Take 1 capsule (1.25 mg total) by mouth once a week. 12 capsule 0   cyanocobalamin   (VITAMIN B12) 1000 MCG tablet Take 1 tablet (1,000 mcg total) by mouth daily. 30 tablet 11   DULoxetine  (CYMBALTA ) 30 MG capsule Take 1 capsule (30 mg total) by mouth daily. 14 capsule 1   DULoxetine  (CYMBALTA ) 60 MG capsule Take 1 capsule (60 mg total) by mouth daily. Begin after the 2 weeks of 30 mg. 30 capsule 1   EPINEPHrine  0.3 mg/0.3 mL IJ SOAJ injection Inject 0.3 mg into the muscle as needed for anaphylaxis. 1 each 0   ezetimibe  (ZETIA ) 10 MG tablet Take 1 tablet by mouth once daily 90 tablet 3   Fluticasone -Umeclidin-Vilant (TRELEGY ELLIPTA ) 100-62.5-25 MCG/ACT AEPB Inhale 1 puff into the lungs daily. 1 each 11   hydrOXYzine  (ATARAX ) 10 MG tablet Take 1-3 tablets (10-30 mg total) by mouth 3 (three) times daily as needed. 60 tablet 1   ipratropium-albuterol  (DUONEB) 0.5-2.5 (3) MG/3ML SOLN Take 3 mLs by nebulization every 4 (four) hours as needed. 360 mL 2   mupirocin  ointment (BACTROBAN ) 2 % Apply 1 Application topically 2 (two) times daily. 22 g 1   pantoprazole  (PROTONIX ) 20 MG tablet TAKE 1 TABLET BY MOUTH ONCE DAILY FOR  HEARTBURN 90 tablet 0   tadalafil  (CIALIS ) 5 MG tablet TAKE  1 TABLET BY MOUTH ONCE DAILY FOR FREQUENT URINATION 90 tablet 0   telmisartan -hydrochlorothiazide  (MICARDIS  HCT) 80-12.5 MG tablet Take 1 tablet by mouth daily. 90 tablet 1   No current facility-administered medications on file prior to visit.   "

## 2024-01-27 NOTE — Assessment & Plan Note (Signed)
 Immunizations UTD. Influenza vaccine provided today.  Colonoscopy due in May 2026 Lung cancer screening up-to-date PSA due and pending.  Discussed the importance of a healthy diet and regular exercise in order for weight loss, and to reduce the risk of further co-morbidity.  Exam stable. Labs pending.  Follow up in 1 year for repeat physical.

## 2024-01-27 NOTE — Assessment & Plan Note (Signed)
Controlled.  Continue pantoprazole 20 mg daily.  

## 2024-01-27 NOTE — Assessment & Plan Note (Addendum)
 Historically controlled  Continue carvedilol  3.125 mg twice daily, telmisartan -hydrochlorothiazide  80-12.5 mg daily. BMP reviewed from November 2025.

## 2024-01-27 NOTE — Assessment & Plan Note (Signed)
 Rate and rhythm regular today.  Following with cardiology, office notes reviewed from October 2025. Continue aspirin  81 mg daily. Pacemaker in place.

## 2024-01-27 NOTE — Assessment & Plan Note (Signed)
 Inspire in place.  Following with pulmonology, office notes reviewed from December 2025.

## 2024-01-27 NOTE — Assessment & Plan Note (Signed)
 No concerns today.  Following with pulmonology, office notes reviewed from December 2025. Continue Trelegy inhaler at 1 puff daily, albuterol  inhaler as needed, duo nebulizer treatments as needed.

## 2024-01-27 NOTE — Assessment & Plan Note (Signed)
 Ongoing.  Following with psychiatry, office notes reviewed from December 2025. Continue Cymbalta  and titrate upward as instructed to 60 mg daily when appropriate. Continue with therapy.

## 2024-01-30 ENCOUNTER — Telehealth (HOSPITAL_BASED_OUTPATIENT_CLINIC_OR_DEPARTMENT_OTHER): Payer: Self-pay | Admitting: *Deleted

## 2024-01-30 ENCOUNTER — Other Ambulatory Visit: Payer: Self-pay

## 2024-01-30 ENCOUNTER — Telehealth: Payer: Self-pay

## 2024-01-30 ENCOUNTER — Telehealth: Payer: Self-pay | Admitting: Cardiovascular Disease

## 2024-01-30 DIAGNOSIS — Z860100 Personal history of colon polyps, unspecified: Secondary | ICD-10-CM

## 2024-01-30 MED ORDER — NA SULFATE-K SULFATE-MG SULF 17.5-3.13-1.6 GM/177ML PO SOLN: 354.0000 mL | Freq: Once | ORAL | 0 refills | Status: AC

## 2024-01-30 NOTE — Telephone Encounter (Signed)
 Pt has been scheduled tele preop appt 04/30/24. Med rec and consent are done.      Patient Consent for Virtual Visit        Brandon Lopez has provided verbal consent on 01/30/2024 for a virtual visit (video or telephone).   CONSENT FOR VIRTUAL VISIT FOR:  Brandon Lopez  By participating in this virtual visit I agree to the following:  I hereby voluntarily request, consent and authorize [facility] and its employed or contracted physicians, physician assistants, nurse practitioners or other licensed health care professionals (the Practitioner), to provide me with telemedicine health care services (the Services) as deemed necessary by the treating Practitioner. I acknowledge and consent to receive the Services by the Practitioner via telemedicine. I understand that the telemedicine visit will involve communicating with the Practitioner through live audiovisual communication technology and the disclosure of certain medical information by electronic transmission. I acknowledge that I have been given the opportunity to request an in-person assessment or other available alternative prior to the telemedicine visit and am voluntarily participating in the telemedicine visit.  I understand that I have the right to withhold or withdraw my consent to the use of telemedicine in the course of my care at any time, without affecting my right to future care or treatment, and that the Practitioner or I may terminate the telemedicine visit at any time. I understand that I have the right to inspect all information obtained and/or recorded in the course of the telemedicine visit and may receive copies of available information for a reasonable fee.  I understand that some of the potential risks of receiving the Services via telemedicine include:  Delay or interruption in medical evaluation due to technological equipment failure or disruption; Information transmitted may not be sufficient (e.g.  poor resolution of images) to allow for appropriate medical decision making by the Practitioner; and/or  In rare instances, security protocols could fail, causing a breach of personal health information.  Furthermore, I acknowledge that it is my responsibility to provide information about my medical history, conditions and care that is complete and accurate to the best of my ability. I acknowledge that Practitioner's advice, recommendations, and/or decision may be based on factors not within their control, such as incomplete or inaccurate data provided by me or distortions of diagnostic images or specimens that may result from electronic transmissions. I understand that the practice of medicine is not an exact science and that Practitioner makes no warranties or guarantees regarding treatment outcomes. I acknowledge that a copy of this consent can be made available to me via my patient portal [facility] MyChart), or I can request a printed copy by calling the office of [facility].    I understand that my insurance will be billed for this visit.   I have read or had this consent read to me. I understand the contents of this consent, which adequately explains the benefits and risks of the Services being provided via telemedicine.  I have been provided ample opportunity to ask questions regarding this consent and the Services and have had my questions answered to my satisfaction. I give my informed consent for the services to be provided through the use of telemedicine in my medical care

## 2024-01-30 NOTE — Telephone Encounter (Signed)
 Gastroenterology Pre-Procedure Review  Request Date: 05/29/2024 Requesting Physician: Dr. Jinny  PATIENT REVIEW QUESTIONS: The patient responded to the following health history questions as indicated:    1. Are you having any GI issues? no 2. Do you have a personal history of Polyps? yes (Dr. Ora 05/27/2014 Polyps) 3. Do you have a family history of Colon Cancer or Polyps? no 4. Diabetes Mellitus? no 5. Joint replacements in the past 12 months?no 6. Major health problems in the past 3 months?no 7. Any artificial heart valves, MVP, or defibrillator?yes (pacemaker )    MEDICATIONS & ALLERGIES:    Patient reports the following regarding taking any anticoagulation/antiplatelet therapy:   Plavix, Coumadin, Eliquis, Xarelto , Lovenox , Pradaxa, Brilinta, or Effient? no Aspirin ? yes (ASA 81 MG)  Patient confirms/reports the following medications:  Current Outpatient Medications  Medication Sig Dispense Refill   albuterol  (VENTOLIN  HFA) 108 (90 Base) MCG/ACT inhaler INHALE 2 PUFFS BY MOUTH EVERY 6 HOURS AS NEEDED FOR WHEEZING AND FOR SHORTNESS OF BREATH 18 g 1   aspirin  EC 81 MG tablet Take 81 mg by mouth daily. Swallow whole.     atorvastatin  (LIPITOR) 20 MG tablet TAKE 1 TABLET BY MOUTH ONCE DAILY FOR CHOLESTEROL 90 tablet 0   carvedilol  (COREG ) 3.125 MG tablet Take 1 tablet (3.125 mg total) by mouth 2 (two) times daily. 180 tablet 1   Cholecalciferol (VITAMIN D3) 1.25 MG (50000 UT) CAPS Take 1 capsule (1.25 mg total) by mouth once a week. 12 capsule 0   cyanocobalamin  (VITAMIN B12) 1000 MCG tablet Take 1 tablet (1,000 mcg total) by mouth daily. 30 tablet 11   DULoxetine  (CYMBALTA ) 30 MG capsule Take 1 capsule (30 mg total) by mouth daily. 14 capsule 1   DULoxetine  (CYMBALTA ) 60 MG capsule Take 1 capsule (60 mg total) by mouth daily. Begin after the 2 weeks of 30 mg. 30 capsule 1   EPINEPHrine  0.3 mg/0.3 mL IJ SOAJ injection Inject 0.3 mg into the muscle as needed for anaphylaxis. 1 each 0    ezetimibe  (ZETIA ) 10 MG tablet Take 1 tablet by mouth once daily 90 tablet 3   Fluticasone -Umeclidin-Vilant (TRELEGY ELLIPTA ) 100-62.5-25 MCG/ACT AEPB Inhale 1 puff into the lungs daily. 1 each 11   hydrOXYzine  (ATARAX ) 10 MG tablet Take 1-3 tablets (10-30 mg total) by mouth 3 (three) times daily as needed. 60 tablet 1   ipratropium-albuterol  (DUONEB) 0.5-2.5 (3) MG/3ML SOLN Take 3 mLs by nebulization every 4 (four) hours as needed. 360 mL 2   mupirocin  ointment (BACTROBAN ) 2 % Apply 1 Application topically 2 (two) times daily. 22 g 1   pantoprazole  (PROTONIX ) 20 MG tablet TAKE 1 TABLET BY MOUTH ONCE DAILY FOR  HEARTBURN 90 tablet 0   tadalafil  (CIALIS ) 5 MG tablet TAKE 1 TABLET BY MOUTH ONCE DAILY FOR FREQUENT URINATION 90 tablet 0   telmisartan -hydrochlorothiazide  (MICARDIS  HCT) 80-12.5 MG tablet Take 1 tablet by mouth daily. 90 tablet 1   No current facility-administered medications for this visit.    Patient confirms/reports the following allergies:  Allergies[1]  No orders of the defined types were placed in this encounter.   AUTHORIZATION INFORMATION Primary Insurance: 1D#: Group #:  Secondary Insurance: 1D#: Group #:  SCHEDULE INFORMATION: Date: 05/29/2024 Time: Location: [facility] Dr. Jinny     [1]  Allergies Allergen Reactions   Abilify  [Aripiprazole ] Shortness Of Breath    Tongue swelling   Simvastatin Diarrhea   Lisinopril  Cough

## 2024-01-30 NOTE — Telephone Encounter (Signed)
 Pt has been scheduled tele preop appt 04/30/24. Med rec and consent are done.

## 2024-01-30 NOTE — Telephone Encounter (Signed)
" ° °  Name: Brandon Lopez  DOB: [birth year]  MRN: [id number]  Primary Cardiologist: [name], MD   Preoperative team, please contact this patient and set up a phone call appointment for further preoperative risk assessment. Please obtain consent and complete medication review. Thank you for your help.  I confirm that guidance regarding antiplatelet and oral anticoagulation therapy has been completed and, if necessary, noted below.  I also confirmed the patient resides in the state of [place] . As per [facility] Medical Board telemedicine laws, the patient must reside in the state in which the provider is licensed.   [name], NP 01/30/2024, 12:10 PM [name]    "

## 2024-01-30 NOTE — Telephone Encounter (Signed)
"  ° °  Pre-operative Risk Assessment    Patient Name: Brandon Lopez  DOB: [birth year] MRN: [id number]  Date of last office visit: 11/08/2023 Date of next office visit: 10/2024   Request for Surgical Clearance    Procedure:  COLONOSCOPY  Date of Surgery:  Clearance 05/29/24                              Surgeon:  DR ROGELIA COPPING Surgeon's Group or Practice Name:  [facility] Phone number:  [phone] Fax number:  [phone] ATTN: CLAUDETTE BONICHE,CMA  Type of Clearance Requested:   - Medical   Type of Anesthesia:  General    Additional requests/questions:    SignedSamule LITTIE Bristol   01/30/2024, 12:02 PM   "

## 2024-02-07 NOTE — Telephone Encounter (Signed)
 Patient has a cardiac clearance appointment with cardiologist on 04/30/2024.

## 2024-02-08 ENCOUNTER — Other Ambulatory Visit: Payer: Self-pay

## 2024-02-08 ENCOUNTER — Telehealth: Payer: Self-pay | Admitting: Cardiovascular Disease

## 2024-02-08 NOTE — Telephone Encounter (Signed)
" °*  STAT* If patient is at the pharmacy, call can be transferred to refill team.   1. Which medications need to be refilled? (please list name of each medication and dose if known)   carvedilol  (COREG ) 3.125 MG tablet     2. Would you like to learn more about the convenience, safety, & potential cost savings by using the [facility]? No   3. Are you open to using the [facility] (Type [facility].  ). No    4. Which pharmacy/location (including street and city if local pharmacy) is medication to be sent to? [id number] - [place], [place] - [address]     5. Do they need a 30 day or 90 day supply? 90  "

## 2024-02-09 MED ORDER — CARVEDILOL 3.125 MG PO TABS: 3.1250 mg | ORAL_TABLET | Freq: Two times a day (BID) | ORAL | 2 refills | Status: AC

## 2024-02-10 ENCOUNTER — Ambulatory Visit (INDEPENDENT_AMBULATORY_CARE_PROVIDER_SITE_OTHER)

## 2024-02-10 ENCOUNTER — Encounter: Payer: Self-pay | Admitting: Internal Medicine

## 2024-02-10 ENCOUNTER — Ambulatory Visit: Payer: Self-pay

## 2024-02-10 ENCOUNTER — Encounter: Payer: Self-pay | Admitting: Emergency Medicine

## 2024-02-10 ENCOUNTER — Ambulatory Visit
Admission: EM | Admit: 2024-02-10 | Discharge: 2024-02-10 | Disposition: A | Discharge status: Home / Self Care | Attending: Emergency Medicine | Admitting: Emergency Medicine

## 2024-02-10 DIAGNOSIS — R0789 Other chest pain: Secondary | ICD-10-CM | POA: Diagnosis not present

## 2024-02-10 MED ORDER — CELECOXIB 100 MG PO CAPS: 100.0000 mg | ORAL_CAPSULE | Freq: Every day | ORAL | 0 refills | Status: AC

## 2024-02-10 MED ORDER — BACLOFEN 10 MG PO TABS: 10.0000 mg | ORAL_TABLET | Freq: Every evening | ORAL | 0 refills | Status: AC | PRN

## 2024-02-10 NOTE — ED Triage Notes (Signed)
 Patient reports that fell yesterday evening. Patient states he was walking upstairs and fell on his belly. Patient states I did not hit his head. Patient now complains left sided rib pain. Rates pain 5/10. Patient took Tylenol  1000 mg at 10:30 am for main with mild relief.

## 2024-02-10 NOTE — Telephone Encounter (Signed)
 Noted.

## 2024-02-10 NOTE — Discharge Instructions (Signed)
 X-ray is pending and you will be notified of results by telephone  If there is a fracture to the right this naturally heals well Tylenol , may take up to 6 to 12 weeks to fully resolve the pain should improve gradually  You have been given a rib belt for compression for stability and support wear when completing activities  You may use Celebrex  once daily for inflammation and support, may take Tylenol  additionally  May use baclofen  at bedtime to help you rest if needed  May apply heat over the affected area in 10 to 15-minute intervals  May continue activity as tolerable  If you have any further concerns please follow-up with primary doctor

## 2024-02-10 NOTE — ED Provider Notes (Signed)
 " CAY RALPH PELT    CSN: [id number] Arrival date & time: 02/10/24  1106      History   Chief Complaint Chief Complaint  Patient presents with   Fall   Rib Injury    HPI Brandon Lopez is a 69 y.o. male.   Patient presents for evaluation of left-sided rib cage pain beginning 1 day ago after fall.  Patient was walking up the steps when he tripped landing on his belly, denies hitting head or loss of consciousness.  Symptoms exacerbated by coughing and can be felt with deep breathing.  Denies presence of shortness of breath.  Has attempted use of Tylenol  last dose around 10:30 AM with minimal relief.     Past Medical History:  Diagnosis Date   Actinic keratosis    Arthritis    BCC (basal cell carcinoma) 11/30/2023   nasal tip, Mohs 01/10/24 with Dr. Corey   Chronic systolic CHF (congestive heart failure) (HCC)    a. echo 6/16: EF 55-60%, Gr1DD, LA nl, PASP nl; b. 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 short episodes previously noted on PPM interrogation; b. started on Xarelto  07/2017 given further episodes of Afib noted during interrogation; c. 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 Biotronik; Jeanett DR-T [id number]; placed 08/2012   Rectal bleeding 09/19/2018   Shortness of  breath 04/21/2021   Skin lesions 07/03/2020   Sleep apnea    does not use CPAP   Symptomatic bradycardia    a. s/p Biotronik; Jeanett DR-T [id number]; placed 08/2012 at [facility]   Syncope 08/10/2017    Patient Active Problem List   Diagnosis Date Noted   Paroxysmal nocturnal dyspnea 10/17/2023   MDD (major depressive disorder), recurrent episode, moderate (HCC) 10/13/2023   Tobacco abuse 11/26/2022   Osteoarthritis of glenohumeral joint, left 11/17/2022   Bilateral lower extremity edema 08/03/2022   Senile purpura 07/16/2022   Insomnia 05/18/2022   GERD (gastroesophageal reflux disease) 01/12/2022   Aortic atherosclerosis 10/27/2021   DDD (degenerative disc disease), lumbar 10/27/2021   DOE (dyspnea on exertion) 06/17/2021   Weak urinary stream 06/02/2021   Asbestos exposure 05/29/2021   Other fatigue 04/21/2021   Nocturia more than twice per night 04/09/2021   Erectile dysfunction 04/09/2021   Chronic back pain 08/17/2019   Chronic shoulder pain 12/12/2018   Paroxysmal atrial fibrillation (HCC) 10/31/2017   Osteoarthritis 03/17/2017   Diverticulosis 12/16/2014   COPD mixed type (HCC) 11/26/2014   Cervical pain (neck) 10/14/2014   Hyperlipidemia 07/11/2014   Preventative health care 07/11/2014   Pacemaker 06/03/2014   Anxiety and depression 06/03/2014   Alcohol abuse 06/03/2014   OSA (obstructive sleep apnea) 06/03/2014   Essential hypertension 06/03/2014    Past Surgical  History:  Procedure Laterality Date   APPENDECTOMY  age 43   CARDIAC CATHETERIZATION     7 years ago   COLONOSCOPY N/A 05/27/2014   Procedure: COLONOSCOPY;  Surgeon: Deward CINDERELLA Piedmont, MD;  Location: [facility] END[facility]COPY;  Service: Gastroenterology;  Laterality: N/A;   DRUG INDUCED ENDOSCOPY Bilateral 11/11/2021   Procedure: DRUG INDUCED ENDOSCOPY;  Surgeon: Carlie Clark, MD;  Location: [facility];  Service: ENT;  Laterality: Bilateral;   FLOOR OF MOUTH BIOPSY N/A 01/05/2022   Procedure: BIOPSY OF PALATE;   Surgeon: Carlie Clark, MD;  Location: [facility];  Service: ENT;  Laterality: N/A;   IMPLANTATION OF HYPOGLOSSAL NERVE STIMULATOR Left 01/05/2022   Procedure: IMPLANTATION OF HYPOGLOSSAL NERVE STIMULATOR;  Surgeon: Carlie Clark, MD;  Location: [facility];  Service: ENT;  Laterality: Left;   INSERT / REPLACE / REMOVE PACEMAKER     3 years ago       Home Medications    Prior to Admission medications  Medication Sig Start Date End Date Taking? Authorizing Provider  albuterol  (VENTOLIN  HFA) 108 (90 Base) MCG/ACT inhaler INHALE 2 PUFFS BY MOUTH EVERY 6 HOURS AS NEEDED FOR WHEEZING AND FOR SHORTNESS OF BREATH 01/04/24   Copland, Jacques, MD  aspirin  EC 81 MG tablet Take 81 mg by mouth daily. Swallow whole.    [provider]  atorvastatin  (LIPITOR) 20 MG tablet TAKE 1 TABLET BY MOUTH ONCE DAILY FOR CHOLESTEROL 01/01/24   Clark, Katherine K, NP  carvedilol  (COREG ) 3.125 MG tablet Take 1 tablet (3.125 mg total) by mouth 2 (two) times daily. 02/09/24   Gollan, Timothy J, MD  Cholecalciferol (VITAMIN D3) 1.25 MG (50000 UT) CAPS Take 1 capsule (1.25 mg total) by mouth once a week. 10/04/23   Bedsole, Amy E, MD  cyanocobalamin  (VITAMIN B12) 1000 MCG tablet Take 1 tablet (1,000 mcg total) by mouth daily. 10/04/23   Bedsole, Amy E, MD  DULoxetine  (CYMBALTA ) 30 MG capsule Take 1 capsule (30 mg total) by mouth daily. 01/13/24   Rhys Verneita DASEN, PA-C  DULoxetine  (CYMBALTA ) 60 MG capsule Take 1 capsule (60 mg total) by mouth daily. Begin after the 2 weeks of 30 mg. 01/13/24   Rhys Verneita T, PA-C  EPINEPHrine  0.3 mg/0.3 mL IJ SOAJ injection Inject 0.3 mg into the muscle as needed for anaphylaxis. 07/21/23   Clark, Katherine K, NP  ezetimibe  (ZETIA ) 10 MG tablet Take 1 tablet by mouth once daily 11/10/23   Gollan, Timothy J, MD  Fluticasone -Umeclidin-Vilant (TRELEGY ELLIPTA ) 100-62.5-25 MCG/ACT AEPB Inhale 1 puff into the lungs daily. 01/04/24   Copland, Jacques, MD   hydrOXYzine  (ATARAX ) 10 MG tablet Take 1-3 tablets (10-30 mg total) by mouth 3 (three) times daily as needed. 01/13/24   Rhys Verneita DASEN, PA-C  ipratropium-albuterol  (DUONEB) 0.5-2.5 (3) MG/3ML SOLN Take 3 mLs by nebulization every 4 (four) hours as needed. 01/04/24   Copland, Jacques, MD  mupirocin  ointment (BACTROBAN ) 2 % Apply 1 Application topically 2 (two) times daily. 01/10/24   Paci, Karina M, MD  pantoprazole  (PROTONIX ) 20 MG tablet TAKE 1 TABLET BY MOUTH ONCE DAILY FOR  HEARTBURN 01/05/24   Clark, Katherine K, NP  tadalafil  (CIALIS ) 5 MG tablet TAKE 1 TABLET BY MOUTH ONCE DAILY FOR FREQUENT URINATION 11/23/23   Clark, Katherine K, NP  telmisartan -hydrochlorothiazide  (MICARDIS  HCT) 80-12.5 MG tablet Take 1 tablet by mouth daily. 01/04/24   Watt Jacques, MD    Family History Family History  Problem Relation Age of Onset  Arthritis Mother    Heart disease Father    Hyperlipidemia Father    Hypertension Father    Cancer Paternal Grandfather    Hyperlipidemia Brother     Social History Social History[1]   Allergies   Abilify  [aripiprazole ], Simvastatin, and Lisinopril    Review of Systems Review of Systems   Physical Exam Triage Vital Signs ED Triage Vitals  Encounter Vitals Group     BP 02/10/24 1200 100/66     Girls Systolic BP Percentile --      Girls Diastolic BP Percentile --      Boys Systolic BP Percentile --      Boys Diastolic BP Percentile --      Pulse Rate 02/10/24 1200 71     Resp 02/10/24 1200 18     Temp 02/10/24 1200 98.1 F (36.7 C)     Temp Source 02/10/24 1200 Oral     SpO2 02/10/24 1200 96 %     Weight --      Height --      Head Circumference --      Peak Flow --      Pain Score 02/10/24 1204 5     Pain Loc --      Pain Education --      Exclude from Growth Chart --    No data found.  Updated Vital Signs BP 100/66 (BP Location: Right Arm)   Pulse 71   Temp 98.1 F (36.7 C) (Oral)   Resp 18   SpO2 96%   Visual  Acuity Right Eye Distance:   Left Eye Distance:   Bilateral Distance:    Right Eye Near:   Left Eye Near:    Bilateral Near:     Physical Exam Constitutional:      Appearance: Normal appearance.  Eyes:     Extraocular Movements: Extraocular movements intact.  Cardiovascular:     Rate and Rhythm: Normal rate and regular rhythm.     Pulses: Normal pulses.     Heart sounds: Normal heart sounds.  Pulmonary:     Effort: Pulmonary effort is normal.     Breath sounds: Normal breath sounds.  Chest:     Comments: Tenderness to the left side of the chest wall approximately above ribs 2 3 and 4 with mild swelling, no ecchymosis or deformity present, chest wall symmetrical Neurological:     Mental Status: He is alert and oriented to person, place, and time.      UC Treatments / Results  Labs (all labs ordered are listed, but only abnormal results are displayed) Labs Reviewed - No data to display  EKG   Radiology No results found.  Procedures Procedures (including critical care time)  Medications Ordered in UC Medications - No data to display  Initial Impression / Assessment and Plan / UC Course  I have reviewed the triage vital signs and the nursing notes.  Pertinent labs & imaging results that were available during my care of the patient were reviewed by me and considered in my medical decision making (see chart for details).  Will pain on the left side  X-ray negative, reported to patient by telephone, 2 patient identifiers used prescribed Celebrex  and baclofen  for home use recommended supportive care through [facility], compression given in the form of Ace bandage advise follow-up with primary doctor for any further concerns Final Clinical Impressions(s) / UC Diagnoses   Final diagnoses:  Rib pain on right side   Discharge Instructions  None    ED Prescriptions   None    PDMP not reviewed this encounter.     [1]  Social History Tobacco Use   Smoking status:  Every Day    Current packs/day: 0.00    Average packs/day: 2.0 packs/day for 59.0 years (118.0 ttl pk-yrs)    Types: Cigarettes    Start date: 1964    Last attempt to quit: 02/01/2021    Years since quitting: 3.0   Smokeless tobacco: Never   Tobacco comments:    Smoking 1 pack   Vaping Use   Vaping status: Never Used  Substance Use Topics   Alcohol use: Yes    Alcohol/week: 14.0 standard drinks of alcohol    Types: 14 Standard drinks or equivalent per week   Drug use: Not Currently    Types: Marijuana    Comment: states smokes daily, last 11-09-21     Teresa Shelba SAUNDERS, NP 02/10/24 1455  "

## 2024-02-10 NOTE — Telephone Encounter (Signed)
 FYI Only or Action Required?: FYI only for provider: ED advised.  Patient was last seen in primary care on 01/27/2024 by Gretta Comer POUR, NP.  Called Nurse Triage reporting Fall.  Symptoms began yesterday.  Interventions attempted: Nothing.  Symptoms are: unchanged.  Triage Disposition: See PCP When Office is Open (Within 3 Days)  Patient/caregiver understands and will follow disposition?: Yes    Copied from CRM [id number]. Topic: Clinical - Red Word Triage >> Feb 10, 2024 10:18 AM Emylou G wrote: [facility] that prompted transfer to Nurse Triage: fell yesterday.. hurt his rib area above his heart - some pain  Please warm transfer Red Word call to Nurse Triage and then click Send Message and Close CRM.   Reason for Disposition  [1] Taking Coumadin (warfarin) or other strong blood thinner AND [2] falling is a recurrent problem  Answer Assessment - Initial Assessment Questions Pt called to report rib pain with coughing after fall yesterday. Pt reports missing/ tripping at top of the stairs and fell forward. Denies falling back down stairs, did not hit head or LOC. Pt denies any dizziness, no n/v, no h/a. Pt does take daily ASA but no other blood thinners. Pt states he was able to get himself up without assistance. Pt states that he is having significant rib pain when he coughs. Discussed appt Monday with clinic vs. ED for imaging d/t age and ASA use. Pt agreeable and going to ED for eval.      1. MECHANISM: How did the fall happen?     Pt states when he got to the top of his stairs yesterday, he fell forward. Pt denies falling back down stairs, no dizziness, no LOC.   2. DOMESTIC VIOLENCE AND ELDER ABUSE SCREENING: Did you fall because someone pushed you or tried to hurt you? If Yes, ask: Are you safe now?     No   3. ONSET: When did the fall happen? (e.g., minutes, hours, or days ago)     Yesterday  4. LOCATION: What part of the body hit the ground? (e.g., back,  buttocks, head, hips, knees, hands, head, stomach)     Fell forward, denies hitting head but having rib pain   5. INJURY: Did you hurt (injure) yourself when you fell? If Yes, ask: What did you injure? Tell me more about this? (e.g., body area; type of injury; pain severity)     No cuts, bruises or visible injury   6. PAIN: Is there any pain? If Yes, ask: How bad is the pain? (e.g., Scale 0-10; or none, mild,      Yes; pt states rib pain with coughing. Denies pain with deep breathing   7. SIZE: For cuts, bruises, or swelling, ask: How large is it? (e.g., inches or centimeters)      No   8. PREGNANCY: Is there any chance you are pregnant? When was your last menstrual period?     N/a   9. OTHER SYMPTOMS: Do you have any other symptoms? (e.g., dizziness, fever, weakness; new-onset or worsening).      No   10. CAUSE: What do you think caused the fall (or falling)? (e.g., dizzy spell, tripped)       Unsure, suspects he missed top stair  Protocols used: Falls and [facility]

## 2024-02-13 ENCOUNTER — Encounter: Payer: Self-pay | Admitting: Dermatology

## 2024-02-13 ENCOUNTER — Ambulatory Visit: Admitting: Dermatology

## 2024-02-13 DIAGNOSIS — L711 Rhinophyma: Secondary | ICD-10-CM | POA: Diagnosis not present

## 2024-02-13 DIAGNOSIS — C4491 Basal cell carcinoma of skin, unspecified: Secondary | ICD-10-CM

## 2024-02-13 DIAGNOSIS — Z85828 Personal history of other malignant neoplasm of skin: Secondary | ICD-10-CM

## 2024-02-13 DIAGNOSIS — L910 Hypertrophic scar: Secondary | ICD-10-CM | POA: Diagnosis not present

## 2024-02-13 NOTE — Progress Notes (Signed)
 "  Follow Up Visit   Subjective  Brandon Lopez is a 69 y.o. male who presents for the following: follow up from Mohs surgery   The patient presents for follow up from Mohs surgery for a BCC on the nasal tip, treated on 01/10/24, repaired with linear closure. The patient has been bandaging the wound as directed. The endorse the following concerns: none  The following portions of the chart were reviewed this encounter and updated as appropriate: medications, allergies, medical history  Review of Systems:  No other skin or systemic complaints except as noted in HPI or Assessment and Plan.  Objective  Well appearing patient in no apparent distress; mood and affect are within normal limits.  A focal examination was performed including scalp, head, face. All findings within normal limits unless otherwise noted below.  Healing wound with mild erythema       Relevant physical exam findings are noted in the Assessment and Plan.    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 [facility] on the nasal tip, treated on 01/10/24, repaired with linear closure - Reassured that wound is healing well - No evidence of infection - No swelling, induration, purulence, dehiscence, or tenderness out of proportion to the clinical exam, see photo above - Discussed that scars take up to 12 months to mature from the date of surgery - Recommend SPF 30+ to scar daily to prevent purple color from UV exposure during scar maturation process - Discussed that erythema and raised appearance of scar will fade over the next 4-6 months - OK to start scar massage at 4-6 weeks post-op - Can consider silicone based products for scar healing starting at 6 weeks post-op - Discussed option of dermabrasion.  HISTORY OF BASAL CELL CARCINOMA OF THE SKIN - No evidence of recurrence today - Recommend regular full body skin exams - Recommend daily broad spectrum sunscreen SPF 30+ to  sun-exposed areas, reapply every 2 hours as needed.  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 After identifying and marking the area, the site was prepped and draped in a sterile fashion. Local anesthesia was achieved with 1% lidocaine  with epinephrine  (1:100,000) via infiltration, and once adequate anesthesia was confirmed, dermabrasion was performed using sterilized sandpaper. The scar was carefully abraded to the superficial to mid-reticular dermis, with feathering into surrounding skin to facilitate smooth healing. Hemostasis was achieved with light pressure, and the area was cleansed and dressed with a petrolatum-based ointment and a non-adherent dressing. The patient tolerated the procedure well without complications. Post-procedure care instructions were provided, including wound care, infection signs, and strict sun protection. The procedure was performed to improve skin integrity and contour in a medically significant area, reduce the risk of chronic irritation or breakdown, and restore normal anatomic architecture following necessary excision of a malignant lesion.   Return in about 4 weeks (around 03/12/2024) for Follow Up.  I, Darice Smock, CMA, am acting as scribe for Brandon CHRISTELLA HOLY, MD.   Documentation: I have reviewed the above documentation for accuracy and completeness, and I agree with the above.  Brandon CHRISTELLA HOLY, MD  "

## 2024-02-13 NOTE — Patient Instructions (Addendum)

## 2024-02-15 NOTE — Telephone Encounter (Signed)
 Sent 02/09/24

## 2024-02-23 ENCOUNTER — Other Ambulatory Visit: Payer: Self-pay | Admitting: Primary Care

## 2024-02-23 DIAGNOSIS — N401 Enlarged prostate with lower urinary tract symptoms: Secondary | ICD-10-CM

## 2024-02-24 ENCOUNTER — Ambulatory Visit: Admitting: Physician Assistant

## 2024-02-24 ENCOUNTER — Encounter: Payer: Self-pay | Admitting: Physician Assistant

## 2024-02-24 DIAGNOSIS — F331 Major depressive disorder, recurrent, moderate: Secondary | ICD-10-CM

## 2024-02-24 DIAGNOSIS — F17200 Nicotine dependence, unspecified, uncomplicated: Secondary | ICD-10-CM

## 2024-02-24 DIAGNOSIS — F411 Generalized anxiety disorder: Secondary | ICD-10-CM

## 2024-02-24 MED ORDER — ESZOPICLONE 3 MG PO TABS: 3.0000 mg | ORAL_TABLET | Freq: Every evening | ORAL | 0 refills | Status: AC | PRN

## 2024-03-06 ENCOUNTER — Ambulatory Visit

## 2024-03-15 ENCOUNTER — Ambulatory Visit: Admitting: Dermatology

## 2024-04-23 ENCOUNTER — Ambulatory Visit: Admitting: Physician Assistant

## 2024-04-30 ENCOUNTER — Ambulatory Visit

## 2024-05-29 ENCOUNTER — Ambulatory Visit: Admit: 2024-05-29 | Admitting: Gastroenterology

## 2024-05-29 SURGERY — COLONOSCOPY
Anesthesia: General

## 2024-05-30 ENCOUNTER — Ambulatory Visit: Admitting: Dermatology

## 2024-06-05 ENCOUNTER — Ambulatory Visit

## 2024-07-10 ENCOUNTER — Encounter

## 2024-07-24 ENCOUNTER — Ambulatory Visit

## 2024-08-02 ENCOUNTER — Ambulatory Visit

## 2024-09-04 ENCOUNTER — Ambulatory Visit

## 2024-12-04 ENCOUNTER — Ambulatory Visit

## 2025-03-05 ENCOUNTER — Ambulatory Visit
